# Patient Record
Sex: Female | Born: 1935 | Race: White | Hispanic: No | Smoking: Former smoker
Health system: Southern US, Community
[De-identification: ages and names within clinical notes are randomized; demographics above are authoritative.]

## PROBLEM LIST (undated history)

## (undated) DIAGNOSIS — I10 Essential (primary) hypertension: Secondary | ICD-10-CM

## (undated) DIAGNOSIS — R569 Unspecified convulsions: Secondary | ICD-10-CM

## (undated) DIAGNOSIS — B259 Cytomegaloviral disease, unspecified: Secondary | ICD-10-CM

## (undated) DIAGNOSIS — F32A Depression, unspecified: Secondary | ICD-10-CM

## (undated) DIAGNOSIS — A0839 Other viral enteritis: Secondary | ICD-10-CM

## (undated) DIAGNOSIS — L409 Psoriasis, unspecified: Secondary | ICD-10-CM

## (undated) DIAGNOSIS — M1990 Unspecified osteoarthritis, unspecified site: Secondary | ICD-10-CM

## (undated) DIAGNOSIS — Z9289 Personal history of other medical treatment: Secondary | ICD-10-CM

## (undated) DIAGNOSIS — R1310 Dysphagia, unspecified: Secondary | ICD-10-CM

## (undated) DIAGNOSIS — R933 Abnormal findings on diagnostic imaging of other parts of digestive tract: Secondary | ICD-10-CM

## (undated) DIAGNOSIS — L52 Erythema nodosum: Secondary | ICD-10-CM

## (undated) DIAGNOSIS — F329 Major depressive disorder, single episode, unspecified: Secondary | ICD-10-CM

## (undated) DIAGNOSIS — L719 Rosacea, unspecified: Secondary | ICD-10-CM

## (undated) DIAGNOSIS — G5600 Carpal tunnel syndrome, unspecified upper limb: Secondary | ICD-10-CM

## (undated) DIAGNOSIS — J45909 Unspecified asthma, uncomplicated: Secondary | ICD-10-CM

## (undated) HISTORY — DX: Unspecified convulsions: R56.9

## (undated) HISTORY — DX: Essential (primary) hypertension: I10

## (undated) HISTORY — PX: BREAST EXCISIONAL BIOPSY: SUR124

## (undated) HISTORY — PX: TONSILLECTOMY: SUR1361

## (undated) HISTORY — DX: Dysphagia, unspecified: R13.10

## (undated) HISTORY — PX: CHOLECYSTECTOMY: SHX55

## (undated) HISTORY — PX: BILATERAL CARPAL TUNNEL RELEASE: SHX6508

## (undated) HISTORY — PX: INCISIONAL HERNIA REPAIR: SHX193

## (undated) HISTORY — DX: Unspecified asthma, uncomplicated: J45.909

## (undated) HISTORY — DX: Carpal tunnel syndrome, unspecified upper limb: G56.00

## (undated) HISTORY — DX: Abnormal findings on diagnostic imaging of other parts of digestive tract: R93.3

## (undated) HISTORY — DX: Personal history of other medical treatment: Z92.89

## (undated) HISTORY — DX: Erythema nodosum: L52

## (undated) HISTORY — DX: Rosacea, unspecified: L71.9

## (undated) HISTORY — DX: Psoriasis, unspecified: L40.9

---

## 1978-08-17 DIAGNOSIS — R569 Unspecified convulsions: Secondary | ICD-10-CM

## 1978-08-17 HISTORY — DX: Unspecified convulsions: R56.9

## 1997-12-26 ENCOUNTER — Ambulatory Visit (HOSPITAL_COMMUNITY): Admission: RE | Admit: 1997-12-26 | Discharge: 1997-12-26 | Payer: Self-pay | Admitting: *Deleted

## 1998-04-15 ENCOUNTER — Other Ambulatory Visit: Admission: RE | Admit: 1998-04-15 | Discharge: 1998-04-15 | Payer: Self-pay | Admitting: Obstetrics and Gynecology

## 1998-05-29 ENCOUNTER — Other Ambulatory Visit: Admission: RE | Admit: 1998-05-29 | Discharge: 1998-05-29 | Payer: Self-pay | Admitting: Obstetrics and Gynecology

## 1999-06-09 ENCOUNTER — Other Ambulatory Visit: Admission: RE | Admit: 1999-06-09 | Discharge: 1999-06-09 | Payer: Self-pay | Admitting: Obstetrics and Gynecology

## 1999-09-18 DIAGNOSIS — R933 Abnormal findings on diagnostic imaging of other parts of digestive tract: Secondary | ICD-10-CM

## 1999-09-18 HISTORY — DX: Abnormal findings on diagnostic imaging of other parts of digestive tract: R93.3

## 1999-10-09 ENCOUNTER — Encounter (INDEPENDENT_AMBULATORY_CARE_PROVIDER_SITE_OTHER): Payer: Self-pay | Admitting: Specialist

## 1999-10-09 ENCOUNTER — Ambulatory Visit (HOSPITAL_COMMUNITY): Admission: RE | Admit: 1999-10-09 | Discharge: 1999-10-09 | Payer: Self-pay | Admitting: Gastroenterology

## 1999-10-17 ENCOUNTER — Encounter: Admission: RE | Admit: 1999-10-17 | Discharge: 1999-10-17 | Payer: Self-pay | Admitting: *Deleted

## 2000-07-14 ENCOUNTER — Other Ambulatory Visit: Admission: RE | Admit: 2000-07-14 | Discharge: 2000-07-14 | Payer: Self-pay | Admitting: Obstetrics and Gynecology

## 2000-10-19 ENCOUNTER — Encounter: Admission: RE | Admit: 2000-10-19 | Discharge: 2000-10-19 | Payer: Self-pay | Admitting: Family Medicine

## 2001-10-21 ENCOUNTER — Encounter: Admission: RE | Admit: 2001-10-21 | Discharge: 2001-10-21 | Payer: Self-pay | Admitting: Obstetrics and Gynecology

## 2001-10-21 ENCOUNTER — Encounter: Payer: Self-pay | Admitting: Obstetrics and Gynecology

## 2001-11-01 ENCOUNTER — Other Ambulatory Visit: Admission: RE | Admit: 2001-11-01 | Discharge: 2001-11-01 | Payer: Self-pay | Admitting: Obstetrics and Gynecology

## 2002-08-17 HISTORY — PX: COLONOSCOPY: SHX174

## 2002-10-23 ENCOUNTER — Encounter: Payer: Self-pay | Admitting: Obstetrics and Gynecology

## 2002-10-23 ENCOUNTER — Encounter: Admission: RE | Admit: 2002-10-23 | Discharge: 2002-10-23 | Payer: Self-pay | Admitting: Obstetrics and Gynecology

## 2003-03-16 ENCOUNTER — Ambulatory Visit (HOSPITAL_COMMUNITY): Admission: RE | Admit: 2003-03-16 | Discharge: 2003-03-16 | Payer: Self-pay | Admitting: Gastroenterology

## 2003-10-24 ENCOUNTER — Ambulatory Visit (HOSPITAL_COMMUNITY): Admission: RE | Admit: 2003-10-24 | Discharge: 2003-10-24 | Payer: Self-pay | Admitting: Obstetrics and Gynecology

## 2003-11-12 ENCOUNTER — Other Ambulatory Visit: Admission: RE | Admit: 2003-11-12 | Discharge: 2003-11-12 | Payer: Self-pay | Admitting: Obstetrics and Gynecology

## 2004-01-17 ENCOUNTER — Encounter: Admission: RE | Admit: 2004-01-17 | Discharge: 2004-01-17 | Payer: Self-pay | Admitting: Obstetrics and Gynecology

## 2004-04-04 ENCOUNTER — Ambulatory Visit (HOSPITAL_COMMUNITY): Admission: RE | Admit: 2004-04-04 | Discharge: 2004-04-04 | Payer: Self-pay | Admitting: Geriatric Medicine

## 2004-11-04 ENCOUNTER — Encounter: Payer: Self-pay | Admitting: Nurse Practitioner

## 2004-11-25 ENCOUNTER — Other Ambulatory Visit: Admission: RE | Admit: 2004-11-25 | Discharge: 2004-11-25 | Payer: Self-pay | Admitting: Obstetrics and Gynecology

## 2005-01-29 ENCOUNTER — Ambulatory Visit (HOSPITAL_COMMUNITY): Admission: RE | Admit: 2005-01-29 | Discharge: 2005-01-29 | Payer: Self-pay | Admitting: Obstetrics and Gynecology

## 2006-02-09 ENCOUNTER — Ambulatory Visit (HOSPITAL_COMMUNITY): Admission: RE | Admit: 2006-02-09 | Discharge: 2006-02-09 | Payer: Self-pay | Admitting: Obstetrics and Gynecology

## 2006-08-12 ENCOUNTER — Emergency Department (HOSPITAL_COMMUNITY): Admission: EM | Admit: 2006-08-12 | Discharge: 2006-08-12 | Payer: Self-pay | Admitting: Emergency Medicine

## 2006-12-02 ENCOUNTER — Other Ambulatory Visit: Admission: RE | Admit: 2006-12-02 | Discharge: 2006-12-02 | Payer: Self-pay | Admitting: Obstetrics and Gynecology

## 2007-02-21 ENCOUNTER — Ambulatory Visit (HOSPITAL_COMMUNITY): Admission: RE | Admit: 2007-02-21 | Discharge: 2007-02-21 | Payer: Self-pay | Admitting: Obstetrics and Gynecology

## 2008-02-29 ENCOUNTER — Ambulatory Visit (HOSPITAL_COMMUNITY): Admission: RE | Admit: 2008-02-29 | Discharge: 2008-02-29 | Payer: Self-pay | Admitting: Obstetrics and Gynecology

## 2008-12-24 ENCOUNTER — Other Ambulatory Visit: Admission: RE | Admit: 2008-12-24 | Discharge: 2008-12-24 | Payer: Self-pay | Admitting: Obstetrics and Gynecology

## 2009-01-23 ENCOUNTER — Encounter: Admission: RE | Admit: 2009-01-23 | Discharge: 2009-01-23 | Payer: Self-pay | Admitting: Obstetrics and Gynecology

## 2010-01-27 ENCOUNTER — Ambulatory Visit (HOSPITAL_COMMUNITY): Admission: RE | Admit: 2010-01-27 | Discharge: 2010-01-27 | Payer: Self-pay | Admitting: Obstetrics and Gynecology

## 2010-12-29 ENCOUNTER — Other Ambulatory Visit (HOSPITAL_COMMUNITY)
Admission: RE | Admit: 2010-12-29 | Discharge: 2010-12-29 | Disposition: A | Discharge status: Home / Self Care | Payer: Medicare Other | Source: Ambulatory Visit | Attending: Obstetrics and Gynecology | Admitting: Obstetrics and Gynecology

## 2010-12-29 ENCOUNTER — Other Ambulatory Visit: Payer: Self-pay | Admitting: Obstetrics and Gynecology

## 2010-12-29 DIAGNOSIS — Z124 Encounter for screening for malignant neoplasm of cervix: Secondary | ICD-10-CM | POA: Insufficient documentation

## 2011-01-02 NOTE — Op Note (Signed)
   NAME:  Sharon Gilmore, Sharon Gilmore                      ACCOUNT NO.:  [id number]   MEDICAL RECORD NO.:  [id number]                   PATIENT TYPE:  AMB   LOCATION:  ENDO                                 FACILITY:  [facility]   PHYSICIAN:  Bernette Redbird, M.D.                DATE OF BIRTH:  [birth year]   DATE OF PROCEDURE:  03/16/2003  DATE OF DISCHARGE:                                 OPERATIVE REPORT   PROCEDURE:  Colonoscopy.   INDICATIONS:  Screening for colon cancer in a patient at standard risk  without any worrisome symptoms.   FINDINGS:  Normal exam to the terminal ileum except for minimal sigmoid  diverticulosis.   DESCRIPTION OF PROCEDURE:  The nature, purpose, and risks of the procedure  had been discussed with the patient, who provided written consent.  Sedation  was fentanyl 70 mcg and Versed 7 mg IV without arrhythmias or desaturation.  The Olympus adult video colonoscope was advanced to the terminal ileum with  minimal difficulty, applying some external abdominal compression to  facilitate advancement.  Pullback was then performed.  The quality of the  prep was excellent, and it is felt that all areas were well-seen.   This was a normal examination except for minimal sigmoid diverticulosis.  No  polyps, cancer, colitis, vascular malformations, or extensive diverticulosis  were noted.  Retroflexion in the rectum and reinspection of the rectosigmoid  disclosed no additional findings.  No biopsies were obtained.  The patient  did have mild internal hemorrhoids observed on retroflex viewing.  No  biopsies were obtained, and there were no apparent complications.   IMPRESSION:  Essentially normal screening colonoscopy.   PLAN:  Flexible sigmoidoscopy in five years for continued screening.                                               Bernette Redbird, M.D.    RB/MEDQ  D:  03/16/2003  T:  03/16/2003  Job:  [id number]   cc:   Artist Pais, M.D.  [address], [address]   [place]  [place] [address]  Fax: [phone]   Olivia Canter, M.D., [place], [place]

## 2011-02-20 ENCOUNTER — Other Ambulatory Visit (HOSPITAL_COMMUNITY): Payer: Self-pay | Admitting: Obstetrics and Gynecology

## 2011-02-20 DIAGNOSIS — Z1231 Encounter for screening mammogram for malignant neoplasm of breast: Secondary | ICD-10-CM

## 2011-03-10 ENCOUNTER — Ambulatory Visit (HOSPITAL_COMMUNITY)
Admission: RE | Admit: 2011-03-10 | Discharge: 2011-03-10 | Disposition: A | Discharge status: Home / Self Care | Payer: Medicare Other | Source: Ambulatory Visit | Attending: Obstetrics and Gynecology | Admitting: Obstetrics and Gynecology

## 2011-03-10 DIAGNOSIS — Z1231 Encounter for screening mammogram for malignant neoplasm of breast: Secondary | ICD-10-CM | POA: Insufficient documentation

## 2012-02-02 ENCOUNTER — Other Ambulatory Visit (HOSPITAL_COMMUNITY): Payer: Self-pay | Admitting: Obstetrics and Gynecology

## 2012-02-02 DIAGNOSIS — Z1231 Encounter for screening mammogram for malignant neoplasm of breast: Secondary | ICD-10-CM

## 2012-03-28 ENCOUNTER — Ambulatory Visit (HOSPITAL_COMMUNITY)
Admission: RE | Admit: 2012-03-28 | Discharge: 2012-03-28 | Disposition: A | Discharge status: Home / Self Care | Payer: Medicare Other | Source: Ambulatory Visit | Attending: Obstetrics and Gynecology | Admitting: Obstetrics and Gynecology

## 2012-03-28 DIAGNOSIS — Z1231 Encounter for screening mammogram for malignant neoplasm of breast: Secondary | ICD-10-CM | POA: Insufficient documentation

## 2012-12-13 ENCOUNTER — Other Ambulatory Visit: Payer: Self-pay

## 2013-02-28 ENCOUNTER — Other Ambulatory Visit (HOSPITAL_COMMUNITY): Payer: Self-pay | Admitting: Obstetrics and Gynecology

## 2013-02-28 DIAGNOSIS — Z1231 Encounter for screening mammogram for malignant neoplasm of breast: Secondary | ICD-10-CM

## 2013-03-29 ENCOUNTER — Ambulatory Visit (HOSPITAL_COMMUNITY)
Admission: RE | Admit: 2013-03-29 | Discharge: 2013-03-29 | Disposition: A | Discharge status: Home / Self Care | Payer: Medicare Other | Source: Ambulatory Visit | Attending: Obstetrics and Gynecology | Admitting: Obstetrics and Gynecology

## 2013-03-29 DIAGNOSIS — Z1231 Encounter for screening mammogram for malignant neoplasm of breast: Secondary | ICD-10-CM | POA: Insufficient documentation

## 2013-09-19 ENCOUNTER — Other Ambulatory Visit: Payer: Self-pay | Admitting: Gastroenterology

## 2014-02-26 ENCOUNTER — Other Ambulatory Visit (HOSPITAL_COMMUNITY): Payer: Self-pay | Admitting: Obstetrics and Gynecology

## 2014-02-26 DIAGNOSIS — Z1231 Encounter for screening mammogram for malignant neoplasm of breast: Secondary | ICD-10-CM

## 2014-04-05 ENCOUNTER — Ambulatory Visit (HOSPITAL_COMMUNITY)
Admission: RE | Admit: 2014-04-05 | Discharge: 2014-04-05 | Disposition: A | Discharge status: Home / Self Care | Payer: Medicare Other | Source: Ambulatory Visit | Attending: Obstetrics and Gynecology | Admitting: Obstetrics and Gynecology

## 2014-04-05 DIAGNOSIS — Z1231 Encounter for screening mammogram for malignant neoplasm of breast: Secondary | ICD-10-CM | POA: Diagnosis not present

## 2015-03-25 ENCOUNTER — Other Ambulatory Visit: Payer: Self-pay

## 2015-03-25 DIAGNOSIS — Z1231 Encounter for screening mammogram for malignant neoplasm of breast: Secondary | ICD-10-CM

## 2015-04-29 ENCOUNTER — Ambulatory Visit
Admission: RE | Admit: 2015-04-29 | Discharge: 2015-04-29 | Disposition: A | Discharge status: Home / Self Care | Payer: Medicare Other | Source: Ambulatory Visit

## 2015-04-29 DIAGNOSIS — Z1231 Encounter for screening mammogram for malignant neoplasm of breast: Secondary | ICD-10-CM

## 2016-03-23 ENCOUNTER — Other Ambulatory Visit: Payer: Self-pay | Admitting: Obstetrics and Gynecology

## 2016-03-23 DIAGNOSIS — Z1231 Encounter for screening mammogram for malignant neoplasm of breast: Secondary | ICD-10-CM

## 2016-05-21 ENCOUNTER — Ambulatory Visit
Admission: RE | Admit: 2016-05-21 | Discharge: 2016-05-21 | Disposition: A | Discharge status: Home / Self Care | Payer: Medicare Other | Source: Ambulatory Visit | Attending: Obstetrics and Gynecology | Admitting: Obstetrics and Gynecology

## 2016-05-21 DIAGNOSIS — Z1231 Encounter for screening mammogram for malignant neoplasm of breast: Secondary | ICD-10-CM

## 2016-06-02 ENCOUNTER — Encounter: Payer: Self-pay | Admitting: Nurse Practitioner

## 2016-06-03 ENCOUNTER — Ambulatory Visit (INDEPENDENT_AMBULATORY_CARE_PROVIDER_SITE_OTHER): Payer: Medicare Other | Admitting: Nurse Practitioner

## 2016-06-03 ENCOUNTER — Encounter: Payer: Self-pay | Admitting: Nurse Practitioner

## 2016-06-03 VITALS — BP 138/72 | HR 85 | Ht 63.5 in | Wt 184.0 lb

## 2016-06-03 DIAGNOSIS — R0789 Other chest pain: Secondary | ICD-10-CM

## 2016-06-03 DIAGNOSIS — R0609 Other forms of dyspnea: Secondary | ICD-10-CM | POA: Diagnosis not present

## 2016-06-03 DIAGNOSIS — R9431 Abnormal electrocardiogram [ECG] [EKG]: Secondary | ICD-10-CM

## 2016-06-03 DIAGNOSIS — R0600 Dyspnea, unspecified: Secondary | ICD-10-CM

## 2016-06-03 DIAGNOSIS — R011 Cardiac murmur, unspecified: Secondary | ICD-10-CM | POA: Diagnosis not present

## 2016-06-03 NOTE — Patient Instructions (Addendum)
We will be checking the following labs today - NONE   Medication Instructions:    Continue with your current medicines.     Testing/Procedures To Be Arranged:  Echocardiogram   Lexiscan Myoview  Follow-Up:   We will see how your tests turn out and then decide about your follow up.     Other Special Instructions:   N/A    If you need a refill on your cardiac medications before your next appointment, please call your pharmacy.   Call the [facility] office at [phone] if you have any questions, problems or concerns.

## 2016-06-03 NOTE — Progress Notes (Signed)
CARDIOLOGY OFFICE NOTE  Date:  06/03/2016    Sharon Gilmore Date of Birth: [birth year] Medical Record #[id number]  PCP:  Olivia CanterKELLY, WILLIAM, MD  Cardiologist:  Delton SeeNelson (DOD)  Chief Complaint  Patient presents with  . Heart Murmur    New patient visit - seen for Dr. Delton SeeNelson ([facility])    History of Present Illness: Sharon Gilmore is a 82 y.o. female who presents today for a new patient visit. Seen for Dr. Delton SeeNelson (DOD).   She has no known coronary disease. Does have history of HTN, psoriasis, impaired glucose and asthma. Records indicate that she has had a pulmonary nodule that is followed by [facility].   Referred here by PCP for evaluation of a heart murmur.   Comes in today. Here alone. She notes that she has not told any of her 7 children that she is here. Friends with Charlotte CrumbPat Adelman and Herma CarsonMichelle Lenze. She notes that she has had some progressive DOE over the past 4 to 6 weeks. She went to [place] for vacation and was not "able to keep up". Had more shortness of breath with walking up inclines. She attributed this to her asthma. No actual chest pain/discomfort noted. She is not very active as a general rule - no regular exercise. Not dizzy or lightheaded. No syncope. She lives alone. Retired Engineer, civil (consulting)nurse. Only history is HTN - no prior stroke/known heart disease. She has had a [facility] nod[facility] - [facility] - but now released - turned out benign.  Past Medical History:  Diagnosis Date  . Abnormal barium swallow 09/1999  . Asthma   . Carpal tunnel syndrome   . Erythema nodosum   . H/O Doppler ultrasound    LEFT LEG 11/1997  . Hypertension   . Impaired fasting glucose   . Psoriasis   . Rosacea   . Seizures (HCC) 1980  . Swallowing disorder     Past Surgical History:  Procedure Laterality Date  . CHOLECYSTECTOMY    . COLONOSCOPY  2004  . INCISIONAL HERNIA REPAIR    . TONSILLECTOMY       Medications: Current Outpatient Prescriptions  Medication Sig Dispense Refill  . aspirin  EC 81 MG tablet Take 81 mg by mouth daily.    . Cholecalciferol (VITAMIN D3) 2000 units TABS Take 1 tablet by mouth daily.    . enalapril (VASOTEC) 20 MG tablet Take 20 mg by mouth daily.    . hydrochlorothiazide (HYDRODIURIL) 12.5 MG tablet Take 12.5 mg by mouth every other day.    . Multiple Vitamins-Minerals (PRESERVISION AREDS 2) CAPS Take 1 capsule by mouth 2 (two) times daily.     No current facility-administered medications for this visit.     Allergies: Allergies  Allergen Reactions  . Celebrex [Celecoxib]     LEG ACHING   . Codeine   . Meloxicam     TUMMY ACHE, INEFFECTIVE  . Nortriptyline   . Penicillins   . Percocet [Oxycodone-Acetaminophen]     Social History: The patient  reports that she has quit smoking. She has never used smokeless tobacco. She reports that she drinks alcohol. She reports that she does not use drugs.   Family History: The patient's family history includes Breast cancer in her paternal grandmother; CVA in her maternal grandmother, mother, and paternal grandfather; Diabetes in her paternal grandmother; Leukemia in her brother; Lung cancer in her paternal uncle; Pancreatic cancer in her brother and paternal aunt; Parkinson's disease in her father; Peripheral vascular disease  in her maternal grandfather; Stroke in her maternal grandmother, mother, paternal aunt, and paternal grandfather.   Review of Systems: Please see the history of present illness.   Otherwise, the review of systems is positive for none.   All other systems are reviewed and negative.   Physical Exam: VS:  BP 138/72   Pulse 85   Ht 5' 3.5" (1.613 m)   Wt 184 lb (83.5 kg)   BMI 32.08 kg/m  .  BMI Body mass index is 32.08 kg/m.  Wt Readings from Last 3 Encounters:  06/03/16 184 lb (83.5 kg)    General: Pleasant. She is overweight. She is alert and in no acute distress.   HEENT: Normal.  Neck: Supple, no JVD, carotid bruits, or masses noted.  Cardiac: Regular rate and rhythm.  Soft systolic murmur noted.  No edema.  Respiratory:  Lungs are clear to auscultation bilaterally with normal work of breathing.  GI: Soft and nontender.  MS: No deformity or atrophy. Gait and ROM intact.  Skin: Warm and dry. Color is normal.  Neuro:  Strength and sensation are intact and no gross focal deficits noted.  Psych: Alert, appropriate and with normal affect.   LABORATORY DATA:  EKG:  EKG is ordered today. This demonstrates NSR - she does have lateral Q waves - no prior EKG to compare with - this is suggestive of prior infarct.  No results found for: WBC, HGB, HCT, PLT, GLUCOSE, CHOL, TRIG, HDL, LDLDIRECT, LDLCALC, ALT, AST, NA, K, CL, CREATININE, BUN, CO2, TSH, PSA, INR, GLUF, HGBA1C, MICROALBUR  BNP (last 3 results) No results for input(s): BNP in the last 8760 hours.  ProBNP (last 3 results) No results for input(s): PROBNP in the last 8760 hours.   Other Studies Reviewed Today:   Assessment/Plan: 1. Murmur -  With some DOE reported - will arrange for echocardiogram.   2. HTN - BP is on controlled on her current regimen.   3. Abnormal EKG - suggestive of prior infarct - will arrange for [facility]  Current medicines are reviewed with the patient today.  The patient does not have concerns regarding medicines other than what has been noted above.  The following changes have been made:  See above.  Labs/ tests ordered today include:    Orders Placed This Encounter  Procedures  . Myocardial Perfusion Imaging  . EKG 12-Lead  . ECHOCARDIOGRAM COMPLETE     Disposition:   Further disposition pending. Will see how her studies turn out and then decide about follow up.    Patient is agreeable to this plan and will call if any problems develop in the interim.   Signed: Rosalio Macadamia, RN, ANP-C 06/03/2016 8:38 AM  [facility] [address] [place], [place]  [address] Phone: [phone] Fax: [phone]

## 2016-06-04 ENCOUNTER — Telehealth (HOSPITAL_COMMUNITY): Payer: Self-pay | Admitting: *Deleted

## 2016-06-04 NOTE — Telephone Encounter (Signed)
Patient given detailed instructions per Myocardial Perfusion Study Information Sheet for the test on 06/09/16. Patient notified to arrive 15 minutes early and that it is imperative to arrive on time for appointment to keep from having the test rescheduled.  If you need to cancel or reschedule your appointment, please call the office within 24 hours of your appointment. Failure to do so may result in a cancellation of your appointment, and a $50 no show fee. Patient verbalized understanding. Khushbu Pippen Jacqueline    

## 2016-06-09 ENCOUNTER — Ambulatory Visit (HOSPITAL_COMMUNITY): Payer: Medicare Other | Attending: Cardiology

## 2016-06-09 DIAGNOSIS — R0789 Other chest pain: Secondary | ICD-10-CM | POA: Diagnosis not present

## 2016-06-09 DIAGNOSIS — R9431 Abnormal electrocardiogram [ECG] [EKG]: Secondary | ICD-10-CM

## 2016-06-09 DIAGNOSIS — I10 Essential (primary) hypertension: Secondary | ICD-10-CM | POA: Diagnosis not present

## 2016-06-09 DIAGNOSIS — R0609 Other forms of dyspnea: Secondary | ICD-10-CM | POA: Diagnosis not present

## 2016-06-09 DIAGNOSIS — R011 Cardiac murmur, unspecified: Secondary | ICD-10-CM | POA: Diagnosis not present

## 2016-06-09 LAB — MYOCARDIAL PERFUSION IMAGING
LV dias vol: 71 mL (ref 46–106)
LV sys vol: 23 mL
Peak HR: 85 {beats}/min
RATE: 0.28
Rest HR: 62 {beats}/min
SDS: 0
SRS: 1
SSS: 1
TID: 0.9

## 2016-06-09 MED ORDER — TECHNETIUM TC 99M TETROFOSMIN IV KIT
11.0000 | PACK | Freq: Once | INTRAVENOUS | Status: AC | PRN
  Administered 2016-06-09: 11 via INTRAVENOUS
  Filled 2016-06-09: qty 11

## 2016-06-09 MED ORDER — TECHNETIUM TC 99M TETROFOSMIN IV KIT
32.4000 | PACK | Freq: Once | INTRAVENOUS | Status: AC | PRN
  Administered 2016-06-09: 32.4 via INTRAVENOUS
  Filled 2016-06-09: qty 33

## 2016-06-09 MED ORDER — REGADENOSON 0.4 MG/5ML IV SOLN
0.4000 mg | Freq: Once | INTRAVENOUS | Status: AC
  Administered 2016-06-09: 0.4 mg via INTRAVENOUS

## 2016-06-11 ENCOUNTER — Ambulatory Visit (HOSPITAL_COMMUNITY)
Admission: RE | Admit: 2016-06-11 | Discharge: 2016-06-11 | Disposition: A | Discharge status: Home / Self Care | Payer: Medicare Other | Source: Ambulatory Visit | Attending: Nurse Practitioner | Admitting: Nurse Practitioner

## 2016-06-11 DIAGNOSIS — R011 Cardiac murmur, unspecified: Secondary | ICD-10-CM | POA: Diagnosis not present

## 2016-06-11 DIAGNOSIS — I119 Hypertensive heart disease without heart failure: Secondary | ICD-10-CM | POA: Insufficient documentation

## 2016-06-11 DIAGNOSIS — R0609 Other forms of dyspnea: Secondary | ICD-10-CM | POA: Diagnosis not present

## 2016-06-11 DIAGNOSIS — R9431 Abnormal electrocardiogram [ECG] [EKG]: Secondary | ICD-10-CM | POA: Insufficient documentation

## 2016-06-11 DIAGNOSIS — R0789 Other chest pain: Secondary | ICD-10-CM | POA: Diagnosis not present

## 2016-06-11 MED ORDER — PERFLUTREN LIPID MICROSPHERE
INTRAVENOUS | Status: AC
  Filled 2016-06-11: qty 10

## 2016-06-12 MED FILL — Perflutren Lipid Microsphere IV Susp 1.1 MG/ML: INTRAVENOUS | Qty: 10 | Status: AC

## 2016-07-05 ENCOUNTER — Emergency Department (HOSPITAL_BASED_OUTPATIENT_CLINIC_OR_DEPARTMENT_OTHER)
Admission: EM | Admit: 2016-07-05 | Discharge: 2016-07-05 | Disposition: A | Discharge status: Home / Self Care | Payer: Medicare Other | Source: Home / Self Care

## 2016-07-05 ENCOUNTER — Emergency Department (HOSPITAL_COMMUNITY)
Admission: EM | Admit: 2016-07-05 | Discharge: 2016-07-05 | Disposition: A | Discharge status: Home / Self Care | Payer: Medicare Other | Attending: Emergency Medicine | Admitting: Emergency Medicine

## 2016-07-05 ENCOUNTER — Emergency Department (HOSPITAL_COMMUNITY): Payer: Medicare Other

## 2016-07-05 ENCOUNTER — Encounter (HOSPITAL_COMMUNITY): Payer: Self-pay | Admitting: Emergency Medicine

## 2016-07-05 DIAGNOSIS — I10 Essential (primary) hypertension: Secondary | ICD-10-CM | POA: Insufficient documentation

## 2016-07-05 DIAGNOSIS — M79651 Pain in right thigh: Secondary | ICD-10-CM | POA: Insufficient documentation

## 2016-07-05 DIAGNOSIS — M79604 Pain in right leg: Secondary | ICD-10-CM

## 2016-07-05 DIAGNOSIS — J45909 Unspecified asthma, uncomplicated: Secondary | ICD-10-CM | POA: Diagnosis not present

## 2016-07-05 DIAGNOSIS — Z87891 Personal history of nicotine dependence: Secondary | ICD-10-CM | POA: Insufficient documentation

## 2016-07-05 DIAGNOSIS — M79609 Pain in unspecified limb: Secondary | ICD-10-CM | POA: Diagnosis not present

## 2016-07-05 DIAGNOSIS — Z7982 Long term (current) use of aspirin: Secondary | ICD-10-CM | POA: Insufficient documentation

## 2016-07-05 NOTE — ED Triage Notes (Signed)
Patient reports she has done a lot of flying recently. Complaining of right thigh pain x16 days. Patient was seen by PCP and sent here to be tested for a blood clot.

## 2016-07-05 NOTE — Progress Notes (Signed)
*  PRELIMINARY RESULTS* Vascular Ultrasound Right lower extremity venous duplex has been completed.  Preliminary findings: No evidence of DVT or baker's cyst.  Farrel DemarkJill Eunice, RDMS, RVT  07/05/2016, 11:35 AM

## 2016-07-05 NOTE — ED Notes (Signed)
PT DISCHARGED. INSTRUCTIONS GIVEN. AAOX4. PT IN NO APPARENT DISTRESS. THE OPPORTUNITY TO ASK QUESTIONS WAS PROVIDED. 

## 2016-07-05 NOTE — Discharge Instructions (Signed)
Your ultrasound and your imaging were negative for abnormality . Please follow up with your primary care doctor tomorrow. Return to the ED if you develop uncontrolled pain. Fever, leg discoloration, chest pain, shortness of breath, leg weakness.

## 2016-07-05 NOTE — ED Provider Notes (Signed)
[facility]-EMERGENCY DEPT Provider Note   CSN: [id number] Arrival date & time: 07/05/16  [id number]     History   Chief Complaint Chief Complaint  Patient presents with  . Leg Pain    HPI Sharon Gilmore is a 82 y.o. female. Who presents the the ED for R/O dvt R leg. She was sent by her pcp. Patient has a had 16 days of pain in the R  Ant thigh. She states that it is constantly achy and sometimes burning. Worse with standing and ambulation. 2 days ago. She had pitting edema in the right ankle. Patient states that in September. She flew several times to [place]. Her doctor sent her here for DVT rule out. She has no swelling in the right leg today. She denies history of hip or low back problems. 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 This is resolved. She denies any weakness, saddle anesthesia. She has been taking Aleve twice daily with moderate relief of her symptoms HPI  Past Medical History:  Diagnosis Date  . Abnormal barium swallow 09/1999  . Asthma   . Carpal tunnel syndrome   . Erythema nodosum   . H/O Doppler ultrasound    LEFT LEG 11/1997  . Hypertension   . Impaired fasting glucose   . Psoriasis   . Rosacea   . Seizures (HCC) 1980  . Swallowing disorder   . Systolic murmur     There are no active problems to display for this patient.   Past Surgical History:  Procedure Laterality Date  . CHOLECYSTECTOMY    . COLONOSCOPY  2004  . INCISIONAL HERNIA REPAIR    . TONSILLECTOMY      OB History    No data available       Home Medications    Prior to Admission medications   Medication Sig Start Date End Date Taking? Authorizing Provider  aspirin EC 81 MG tablet Take 81 mg by mouth daily.    Historical Provider, MD  Cholecalciferol (VITAMIN D3) 2000 units TABS Take 1 tablet by mouth daily.    Historical Provider, MD  enalapril (VASOTEC) 20 MG tablet Take 20 mg  by mouth daily.    Historical Provider, MD  hydrochlorothiazide (HYDRODIURIL) 12.5 MG tablet Take 12.5 mg by mouth every other day.    Historical Provider, MD  Multiple Vitamins-Minerals (PRESERVISION AREDS 2) CAPS Take 1 capsule by mouth 2 (two) times daily.    Historical Provider, MD    Family History Family History  Problem Relation Age of Onset  . CVA Mother   . Stroke Mother   . Parkinson's disease Father   . Leukemia Brother   . CVA Maternal Grandmother   . Stroke Maternal Grandmother   . Peripheral vascular disease Maternal Grandfather   . Breast cancer Paternal Grandmother   . Diabetes Paternal Grandmother   . CVA Paternal Grandfather   . Stroke Paternal Grandfather   . Pancreatic cancer Paternal Aunt   . Stroke Paternal Aunt   . Lung cancer Paternal Uncle   . Pancreatic cancer Brother     Social History Social History  Substance Use Topics  . Smoking status: Former Games developermoker  . Smokeless tobacco: Never Used  . Alcohol use Yes     Allergies   Celebrex [celecoxib]; Codeine; Meloxicam; Nortriptyline; Penicillins; and Percocet [oxycodone-acetaminophen]   Review of Systems Review of Systems  Ten systems reviewed and  are negative for acute change, except as noted in the HPI.   Physical Exam Updated Vital Signs BP 144/89 (BP Location: Right Arm)   Pulse 90   Temp 98.4 F (36.9 C) (Oral)   Resp 18   Ht 5\' 3"  (1.6 m)   Wt 83 kg   SpO2 94%   BMI 32.42 kg/m   Physical Exam  Constitutional: She is oriented to person, place, and time. She appears well-developed and well-nourished. No distress.  HENT:  Head: Normocephalic and atraumatic.  Eyes: Conjunctivae are normal. No scleral icterus.  Neck: Normal range of motion.  Cardiovascular: Normal rate, regular rhythm, normal heart sounds and intact distal pulses.  Exam reveals no gallop and no friction rub.   No murmur heard. No peripheral edema  Pulmonary/Chest: Effort normal and breath sounds normal. No  respiratory distress.  Abdominal: Soft. Bowel sounds are normal. She exhibits no distension and no mass. There is no tenderness. There is no guarding.  Neurological: She is alert and oriented to person, place, and time.  Skin: Skin is warm and dry. She is not diaphoretic.  Nursing note and vitals reviewed.    ED Treatments / Results  Labs (all labs ordered are listed, but only abnormal results are displayed) Labs Reviewed - No data to display  EKG  EKG Interpretation None       Radiology No results found.  Procedures Procedures (including critical care time)  Medications Ordered in ED Medications - No data to display   Initial Impression / Assessment and Plan / ED Course  I have reviewed the triage vital signs and the nursing notes.  Pertinent labs & imaging results that were available during my care of the patient were reviewed by me and considered in my medical decision making (see chart for details).  Clinical Course     Patient with negative plain film and negative ultrasound. Patient has no signs of infection. I suspect  Radiculopathy.  Patient appears safe for discharge. She is ambulatory. Final Clinical Impressions(s) / ED Diagnoses   Final diagnoses:  Right thigh pain    New Prescriptions New Prescriptions   No medications on file     Arthor Captainbigail Aaronmichael Brumbaugh, PA-C 07/05/16 1634    Cathren LaineKevin Steinl, MD 07/12/16 [phone]

## 2017-01-04 ENCOUNTER — Encounter (INDEPENDENT_AMBULATORY_CARE_PROVIDER_SITE_OTHER): Payer: Self-pay

## 2017-01-04 ENCOUNTER — Ambulatory Visit (INDEPENDENT_AMBULATORY_CARE_PROVIDER_SITE_OTHER): Payer: Medicare Other | Admitting: Neurology

## 2017-01-04 ENCOUNTER — Encounter: Payer: Self-pay | Admitting: Neurology

## 2017-01-04 DIAGNOSIS — M4800 Spinal stenosis, site unspecified: Secondary | ICD-10-CM | POA: Insufficient documentation

## 2017-01-04 DIAGNOSIS — R109 Unspecified abdominal pain: Secondary | ICD-10-CM | POA: Insufficient documentation

## 2017-01-04 DIAGNOSIS — M5442 Lumbago with sciatica, left side: Secondary | ICD-10-CM | POA: Diagnosis not present

## 2017-01-04 DIAGNOSIS — R1012 Left upper quadrant pain: Secondary | ICD-10-CM

## 2017-01-04 MED ORDER — GABAPENTIN 100 MG PO CAPS: 300.0000 mg | ORAL_CAPSULE | Freq: Every day | ORAL | 6 refills | Status: DC

## 2017-01-04 NOTE — Progress Notes (Signed)
PATIENT: Sharon Gilmore DOB: 01-05-1934  Chief Complaint  Patient presents with  . Pain    Reports pain over left ribcage and left leg from buttock to ankle. Pain prevents her from getting a good night's sleep and limits her walking ability.  Says the pain and sleep deprivation is causing her depression.  Marland Kitchen PCP    Olin Hauser, MD     HISTORICAL  Sharon Gilmore is 83 years old right-handed female, seen in refer by her primary care physician Dr. Claiborne Billings, Gwyndolyn Saxon for evaluation of left-sided pain, initial evaluation was on Jan 04 2017.  I reviewed and summarized the referring note, she had past medical history of hypertension, seizure, last recurrence was in 1980s, psoriasis, asthma, carpal tunnel syndromes, vitamin D deficiency,  She was previously highly functional, in March 2018, she woke up one night from sleep, noticed left upper quadrant deep achy pain, also burning sensation, which has been persistent since then, she also reported radiating pain to her left shoulder, she has difficulty sleeping, has to sit up in her chair for hours to tire herself out with computer games before she can go to sleep,  Over the past 2 months, her pain has slight improvement, but in May 2018, she also developed left lower back pain, radiating pain to left hip, along left lateral thigh, to left leg, to left ankle, she has mild gait abnormality due to pain, urinary urgency,  I am was able to review x-ray of lumbar or November 06 2016 from the wound, extensive multilevel discogenic degenerative changes most severe at L3-4, L4-5, L5-S1, severe multilevel lower lumbar facet arthrosis,  X-ray of thoracic spine, multilevel mild to moderate disc degenerative changes throughout the mid and the lower thoracic spine,  Chest x-ray showed no acute findings  I reviewed the laboratory evaluation in 2018, negative troponin, normal CBC with hemoglobin of 12 point 9, ESR of 17, C-reactive protein of 10, normal  CMP, with creatinine of 0.57, glucose was 96  REVIEW OF SYSTEMS: Full 14 system review of systems performed and notable only for changing appetite, decreased energy, not enough sleep, decreased energy, depression anxiety, sleepiness, fatigue  ALLERGIES: Allergies  Allergen Reactions  . Celebrex [Celecoxib]     LEG ACHING   . Codeine Nausea And Vomiting  . Meloxicam     TUMMY ACHE, INEFFECTIVE  . Nortriptyline Other (See Comments)    unknown  . Percocet [Oxycodone-Acetaminophen]     unknown  . Penicillins Rash    Has patient had a PCN reaction causing immediate rash, facial/tongue/throat swelling, SOB or lightheadedness with hypotension: No Has patient had a PCN reaction causing severe rash involving mucus membranes or skin necrosis: No Has patient had a PCN reaction that required hospitalization No Has patient had a PCN reaction occurring within the last 10 years: No If all of the above answers are "NO", then may proceed with Cephalosporin use.    HOME MEDICATIONS: Current Outpatient Prescriptions  Medication Sig Dispense Refill  . aspirin EC 81 MG tablet Take 81 mg by mouth daily.    . Cholecalciferol (VITAMIN D3) 2000 units TABS Take 1 tablet by mouth daily.    . enalapril (VASOTEC) 20 MG tablet Take 20 mg by mouth daily.    . hydrochlorothiazide (HYDRODIURIL) 12.5 MG tablet Take 12.5 mg by mouth every other day.    . Multiple Vitamins-Minerals (PRESERVISION AREDS 2) CAPS Take 1 capsule by mouth 2 (two) times daily.     No current  facility-administered medications for this visit.     PAST MEDICAL HISTORY: Past Medical History:  Diagnosis Date  . Abnormal barium swallow 09/1999  . Asthma   . Carpal tunnel syndrome   . Erythema nodosum   . H/O Doppler ultrasound    LEFT LEG 11/1997  . Hypertension   . Impaired fasting glucose   . Psoriasis   . Rosacea   . Seizures ([place]) 1980  . Swallowing disorder   . Systolic murmur     PAST SURGICAL HISTORY: Past Surgical  History:  Procedure Laterality Date  . CHOLECYSTECTOMY    . COLONOSCOPY  2004  . INCISIONAL HERNIA REPAIR    . TONSILLECTOMY      FAMILY HISTORY: Family History  Problem Relation Age of Onset  . CVA Mother   . Stroke Mother   . Parkinson's disease Father   . Leukemia Brother   . CVA Maternal Grandmother   . Stroke Maternal Grandmother   . Peripheral vascular disease Maternal Grandfather   . Breast cancer Paternal Grandmother   . Diabetes Paternal Grandmother   . CVA Paternal Grandfather   . Stroke Paternal Grandfather   . Pancreatic cancer Paternal Aunt   . Stroke Paternal Aunt   . Lung cancer Paternal Uncle   . Pancreatic cancer Brother     SOCIAL HISTORY:  Social History   Social History  . Marital status: Widowed    Spouse name: N/A  . Number of children: 7  . Years of education: College   Occupational History  . RETIRED RN    Social History Main Topics  . Smoking status: Former Smoker    Quit date: 1982  . Smokeless tobacco: Never Used  . Alcohol use Yes     Comment: 1-2 times per week  . Drug use: No  . Sexual activity: Not on file   Other Topics Concern  . Not on file   Social History Narrative   Lives at home alone.   Right-handed.   Two cups caffeine per day.     PHYSICAL EXAM   Vitals:   01/04/17 1355  BP: (!) 149/80  Pulse: 86  Weight: 182 lb (82.6 kg)  Height: 5' 3"  (1.6 m)    Not recorded      Body mass index is 32.24 kg/m.  PHYSICAL EXAMNIATION:  Gen: NAD, conversant, well nourised, obese, well groomed                     Cardiovascular: Regular rate rhythm, no peripheral edema, warm, nontender. Eyes: Conjunctivae clear without exudates or hemorrhage Neck: Supple, no carotid bruits. Pulmonary: Clear to auscultation bilaterally  Abdomen: Tenderness of left upper quadrant and mid abdomen upon deep palpitation, no skin rash, no rebound  NEUROLOGICAL EXAM:  MENTAL STATUS: Speech:    Speech is normal; fluent and  spontaneous with normal comprehension.  Cognition:     Orientation to time, place and person     Normal recent and remote memory     Normal Attention span and concentration     Normal Language, naming, repeating,spontaneous speech     Fund of knowledge   CRANIAL NERVES: CN II: Visual fields are full to confrontation. Fundoscopic exam is normal with sharp discs and no vascular changes. Pupils are round equal and briskly reactive to light. CN III, IV, VI: extraocular movement are normal. No ptosis. CN V: Facial sensation is intact to pinprick in all 3 divisions bilaterally. Corneal responses are intact.  CN  VII: Face is symmetric with normal eye closure and smile. CN VIII: Hearing is normal to rubbing fingers CN IX, X: Palate elevates symmetrically. Phonation is normal. CN XI: Head turning and shoulder shrug are intact CN XII: Tongue is midline with normal movements and no atrophy.  MOTOR: There is no pronator drift of out-stretched arms. Muscle bulk and tone are normal. Muscle strength is normal.  REFLEXES: Reflexes are 2+ and symmetric at the biceps, triceps, knees, and absent at ankles. Plantar responses are flexor.  SENSORY: Intact to light touch, pinprick, positional sensation and vibratory sensation are intact in fingers and toes.  COORDINATION: Rapid alternating movements and fine finger movements are intact. There is no dysmetria on finger-to-nose and heel-knee-shin.    GAIT/STANCE: She needs push up to get up from seated position, cautious, unsteady   DIAGNOSTIC DATA (LABS, IMAGING, TESTING) - I reviewed patient records, labs, notes, testing and imaging myself where available.   ASSESSMENT AND PLAN  Sharon Gilmore is a 83 y.o. female   Left upper quadrant abdominal pain, tenderness upon deep palpitation  Need to rule out abdomen space occupying lesion  Proceed with CT abdomen and pelvis with and without contrast New-onset left lumbar radiating pain  Multilevel  lumbar degenerative changes, MRI of the lumbar  Gabapentin 100 mg 3 tablets every night for sleep  Marcial Pacas, M.D. Ph.D.  [facility] [address], [place], [place] [address] Ph: [phone] Fax: [phone]  CC: Olin Hauser, MD

## 2017-01-12 ENCOUNTER — Telehealth: Payer: Self-pay | Admitting: Neurology

## 2017-01-12 DIAGNOSIS — R1012 Left upper quadrant pain: Secondary | ICD-10-CM

## 2017-01-12 NOTE — Telephone Encounter (Signed)
I spoke to NeboJennifer at [facility]. She stated that Dr. Terrace ArabiaYan will need to cosign the order and she will flip the order for 01/14/17.

## 2017-01-12 NOTE — Telephone Encounter (Addendum)
Please noticed [facility] have changed the order to CT abdomen and pelvic with contrast

## 2017-01-12 NOTE — Addendum Note (Signed)
Addended by: Levert FeinsteinYAN, Daila Elbert on: 01/12/2017 09:41 AM   Modules accepted: Orders

## 2017-01-12 NOTE — Telephone Encounter (Signed)
Noted, thank you

## 2017-01-12 NOTE — Telephone Encounter (Signed)
Sharon Gilmore with GI called to know if they are able to change CT abdomen/pelvis with and w/out contrast.  Sharon Gilmore states CT abdomen/pelvis with contrast only will show them everything they will need.  Please call

## 2017-01-12 NOTE — Telephone Encounter (Signed)
Co-signed order

## 2017-01-12 NOTE — Telephone Encounter (Signed)
Sharon Gilmore is aware of the change.

## 2017-01-14 ENCOUNTER — Ambulatory Visit
Admission: RE | Admit: 2017-01-14 | Discharge: 2017-01-14 | Disposition: A | Discharge status: Home / Self Care | Payer: Medicare Other | Source: Ambulatory Visit | Attending: Neurology | Admitting: Neurology

## 2017-01-14 DIAGNOSIS — R1012 Left upper quadrant pain: Secondary | ICD-10-CM

## 2017-01-14 DIAGNOSIS — M5442 Lumbago with sciatica, left side: Secondary | ICD-10-CM

## 2017-01-14 MED ORDER — IOPAMIDOL (ISOVUE-300) INJECTION 61%
100.0000 mL | Freq: Once | INTRAVENOUS | Status: AC | PRN
  Administered 2017-01-14: 100 mL via INTRAVENOUS

## 2017-01-15 ENCOUNTER — Telehealth: Payer: Self-pay | Admitting: Neurology

## 2017-01-15 NOTE — Telephone Encounter (Signed)
Please check on patient about her pain,  MRI of the lumbar spine showed severe multilevel degenerative changes, with evidence of severe spinal stenosis at L2-3, L3-4,  If she still has significant pain, any increase her gabapentin to much higher dose, or evaluate her at the earlier appointment  IMPRESSION:  This MRI of the lumbar spine shows severe multilevel degenerative changes as detailed above.  The most significant findings are: 1.   At L2-L3 there is severe spinal stenosis associated with severe left lateral recess stenosis that could lead to left L3 nerve root compression. 2.   At L3-L4, there is severe spinal stenosis though there is no definite nerve root compression. 3.   At L4-L5, there is mild to moderate spinal stenosis and moderately severe left foraminal narrowing that could lead to left L5 nerve root compression. 4.   Degenerative changes at other levels are less likely to lead to nerve root compression.

## 2017-01-18 ENCOUNTER — Telehealth: Payer: Self-pay | Admitting: Neurology

## 2017-01-18 NOTE — Telephone Encounter (Signed)
Patient aware of results.  She was unable to tolerate gabapentin at any higher dose over 300mg  at bedtime.  She would like to further discuss treatment options at her appt on 01/26/17.

## 2017-01-18 NOTE — Telephone Encounter (Signed)
Patient aware of results.  She was unable to tolerate gabapentin at any higher dose over 300mg at bedtime.  She would like to further discuss treatment options at her appt on 01/26/17. 

## 2017-01-18 NOTE — Telephone Encounter (Signed)
Pt calling to get MRI and CT results. She states pain has increased.

## 2017-01-18 NOTE — Telephone Encounter (Signed)
Patient aware of results and will keep her pending appt to further discuss.

## 2017-01-20 ENCOUNTER — Encounter: Payer: Self-pay | Admitting: Neurology

## 2017-01-20 ENCOUNTER — Ambulatory Visit (INDEPENDENT_AMBULATORY_CARE_PROVIDER_SITE_OTHER): Payer: Medicare Other | Admitting: Neurology

## 2017-01-20 VITALS — BP 152/85 | HR 97 | Ht 63.0 in | Wt 181.2 lb

## 2017-01-20 DIAGNOSIS — M5442 Lumbago with sciatica, left side: Secondary | ICD-10-CM

## 2017-01-20 DIAGNOSIS — R1012 Left upper quadrant pain: Secondary | ICD-10-CM | POA: Diagnosis not present

## 2017-01-20 MED ORDER — OXCARBAZEPINE 150 MG PO TABS: 150.0000 mg | ORAL_TABLET | Freq: Two times a day (BID) | ORAL | 6 refills | Status: DC

## 2017-01-20 MED ORDER — LIDOCAINE 4 % EX CREA: 1.0000 "application " | TOPICAL_CREAM | CUTANEOUS | 6 refills | Status: DC | PRN

## 2017-01-20 NOTE — Progress Notes (Signed)
PATIENT: Sharon Gilmore DOB: 1934/02/23  Chief Complaint  Patient presents with  . Left Upper Quadrant Pain    She would like to discuss her CT results.  . Back Pain    She would like to discuss her MRI results.  She is still taking gabapentin 349m, qhs.  She was unable to tolerate 4076m qhs due to excessive drowsiness the following day.     HISTORICAL  Sharon PFANNENSTIELs 83 years old right-handed female, seen in refer by her primary care physician Dr. KeClaiborne BillingsWiGwyndolyn Saxonor evaluation of left-sided pain, initial evaluation was on Jan 04 2017.  I reviewed and summarized the referring note, she had past medical history of hypertension, seizure, last recurrence was in 1980s, psoriasis, asthma, carpal tunnel syndromes, vitamin D deficiency,  She was previously highly functional, in March 2018, she woke up one night from sleep, noticed left upper quadrant deep achy pain, also burning sensation, which has been persistent since then, she also reported radiating pain to her left shoulder, she has difficulty sleeping, has to sit up in her chair for hours to tire herself out with computer games before she can go to sleep,  Over the past 2 months, her pain has slight improvement, but in May 2018, she also developed left lower back pain, radiating pain to left hip, along left lateral thigh, to left leg, to left ankle, she has mild gait abnormality due to pain, urinary urgency,  I am was able to review x-ray of lumbar or November 06 2016 from the wound, extensive multilevel discogenic degenerative changes most severe at L3-4, L4-5, L5-S1, severe multilevel lower lumbar facet arthrosis,  X-ray of thoracic spine, multilevel mild to moderate disc degenerative changes throughout the mid and the lower thoracic spine,  Chest x-ray showed no acute findings  I reviewed the laboratory evaluation in 2018, negative troponin, normal CBC with hemoglobin of 12 point 9, ESR of 17, C-reactive protein of 10,  normal CMP, with creatinine of 0.57, glucose was 96  UPDATE January 20 2017: She continue complains of significant left upper abdominal area radiating pain, radiating pain to left lower extremity, gabapentin 300 mg has been helpful, but higher dose caused drowsiness.  We have personally reviewed MRI of lumbar on Jan 14 2017, severe spinal stenosis at L2-3 with severe left recess stenosis, possible left L3 nerve root compression, severe spinal stenosis at L3-4, no definite nerve root compression, L4-5 mild to moderate spinal stenosis, moderate severe left foraminal narrowing possible L5 nerve root compression  CT abdomen/pelvic showed no significant abnormality  REVIEW OF SYSTEMS: Full 14 system review of systems performed and notable only for as above.  ALLERGIES: Allergies  Allergen Reactions  . Celebrex [Celecoxib]     LEG ACHING   . Codeine Nausea And Vomiting  . Meloxicam     TUMMY ACHE, INEFFECTIVE  . Nortriptyline Other (See Comments)    unknown  . Percocet [Oxycodone-Acetaminophen]     unknown  . Penicillins Rash    Has patient had a PCN reaction causing immediate rash, facial/tongue/throat swelling, SOB or lightheadedness with hypotension: No Has patient had a PCN reaction causing severe rash involving mucus membranes or skin necrosis: No Has patient had a PCN reaction that required hospitalization No Has patient had a PCN reaction occurring within the last 10 years: No If all of the above answers are "NO", then may proceed with Cephalosporin use.    HOME MEDICATIONS: Current Outpatient Prescriptions  Medication Sig Dispense Refill  .  aspirin EC 81 MG tablet Take 81 mg by mouth daily.    . Cholecalciferol (VITAMIN D3) 2000 units TABS Take 1 tablet by mouth daily.    . enalapril (VASOTEC) 20 MG tablet Take 20 mg by mouth daily.    Marland Kitchen gabapentin (NEURONTIN) 100 MG capsule Take 3 capsules (300 mg total) by mouth at bedtime. 90 capsule 6  . hydrochlorothiazide (HYDRODIURIL) 12.5  MG tablet Take 12.5 mg by mouth every other day.    . Multiple Vitamins-Minerals (PRESERVISION AREDS 2) CAPS Take 1 capsule by mouth 2 (two) times daily.     No current facility-administered medications for this visit.     PAST MEDICAL HISTORY: Past Medical History:  Diagnosis Date  . Abnormal barium swallow 09/1999  . Asthma   . Carpal tunnel syndrome   . Erythema nodosum   . H/O Doppler ultrasound    LEFT LEG 11/1997  . Hypertension   . Impaired fasting glucose   . Psoriasis   . Rosacea   . Seizures ([place]) 1980  . Swallowing disorder   . Systolic murmur     PAST SURGICAL HISTORY: Past Surgical History:  Procedure Laterality Date  . CHOLECYSTECTOMY    . COLONOSCOPY  2004  . INCISIONAL HERNIA REPAIR    . TONSILLECTOMY      FAMILY HISTORY: Family History  Problem Relation Age of Onset  . CVA Mother   . Stroke Mother   . Parkinson's disease Father   . Leukemia Brother   . CVA Maternal Grandmother   . Stroke Maternal Grandmother   . Peripheral vascular disease Maternal Grandfather   . Breast cancer Paternal Grandmother   . Diabetes Paternal Grandmother   . CVA Paternal Grandfather   . Stroke Paternal Grandfather   . Pancreatic cancer Paternal Aunt   . Stroke Paternal Aunt   . Lung cancer Paternal Uncle   . Pancreatic cancer Brother     SOCIAL HISTORY:  Social History   Social History  . Marital status: Widowed    Spouse name: N/A  . Number of children: 7  . Years of education: College   Occupational History  . RETIRED RN    Social History Main Topics  . Smoking status: Former Smoker    Quit date: 1982  . Smokeless tobacco: Never Used  . Alcohol use Yes     Comment: 1-2 times per week  . Drug use: No  . Sexual activity: Not on file   Other Topics Concern  . Not on file   Social History Narrative   Lives at home alone.   Right-handed.   Two cups caffeine per day.     PHYSICAL EXAM   Vitals:   01/20/17 1456  BP: (!) 152/85  Pulse: 97    Weight: 181 lb 4 oz (82.2 kg)  Height: 5' 3"  (1.6 m)    Not recorded      Body mass index is 32.11 kg/m.  PHYSICAL EXAMNIATION:  Gen: NAD, conversant, well nourised, obese, well groomed                     Cardiovascular: Regular rate rhythm, no peripheral edema, warm, nontender. Eyes: Conjunctivae clear without exudates or hemorrhage Neck: Supple, no carotid bruits. Pulmonary: Clear to auscultation bilaterally  Abdomen: Tenderness of left upper quadrant and mid abdomen upon deep palpitation, no skin rash, no rebound  NEUROLOGICAL EXAM:  MENTAL STATUS: Speech:    Speech is normal; fluent and spontaneous with normal comprehension.  Cognition:     Orientation to time, place and person     Normal recent and remote memory     Normal Attention span and concentration     Normal Language, naming, repeating,spontaneous speech     Fund of knowledge   CRANIAL NERVES: CN II: Visual fields are full to confrontation. Fundoscopic exam is normal with sharp discs and no vascular changes. Pupils are round equal and briskly reactive to light. CN III, IV, VI: extraocular movement are normal. No ptosis. CN V: Facial sensation is intact to pinprick in all 3 divisions bilaterally. Corneal responses are intact.  CN VII: Face is symmetric with normal eye closure and smile. CN VIII: Hearing is normal to rubbing fingers CN IX, X: Palate elevates symmetrically. Phonation is normal. CN XI: Head turning and shoulder shrug are intact CN XII: Tongue is midline with normal movements and no atrophy.  MOTOR: There is no pronator drift of out-stretched arms. Muscle bulk and tone are normal. Muscle strength is normal.  REFLEXES: Reflexes are 2+ and symmetric at the biceps, triceps, knees, and absent at ankles. Plantar responses are flexor.  SENSORY: Intact to light touch, pinprick, positional sensation and vibratory sensation are intact in fingers and toes.  COORDINATION: Rapid alternating movements  and fine finger movements are intact. There is no dysmetria on finger-to-nose and heel-knee-shin.    GAIT/STANCE: She needs push up to get up from seated position, cautious, unsteady   DIAGNOSTIC DATA (LABS, IMAGING, TESTING) - I reviewed patient records, labs, notes, testing and imaging myself where available.   ASSESSMENT AND PLAN  RECHY BOST is a 83 y.o. female   Left lumbar radiating pain  Multilevel lumbar degenerative changes especially upper lumbar region, L2-3, L3-4 Left abdominal pain  Proceed with MRI of thoracic spine to rule out left thoracic pathology  EMG nerve conduction study  Laboratory evaluation for inflammatory etiology  Referred to pain clinic  Trileptal 150 mg twice a day, low-dose gabapentin 100 mg 3 times during the day     Marcial Pacas, M.D. Ph.D.  [facility] [address] [place], [place] [address] Ph: [phone] Fax: [phone]  CC: Olin Hauser, MD

## 2017-01-21 ENCOUNTER — Telehealth: Payer: Self-pay | Admitting: Neurology

## 2017-01-21 LAB — C-REACTIVE PROTEIN: CRP: 23.4 mg/L — ABNORMAL HIGH (ref 0.0–4.9)

## 2017-01-21 LAB — CBC WITH DIFFERENTIAL/PLATELET
BASOS ABS: 0 10*3/uL (ref 0.0–0.2)
Basos: 1 %
EOS (ABSOLUTE): 0.3 10*3/uL (ref 0.0–0.4)
Eos: 4 %
HEMATOCRIT: 40.1 % (ref 34.0–46.6)
Hemoglobin: 12.9 g/dL (ref 11.1–15.9)
Immature Grans (Abs): 0 10*3/uL (ref 0.0–0.1)
Immature Granulocytes: 0 %
LYMPHS ABS: 1.8 10*3/uL (ref 0.7–3.1)
Lymphs: 21 %
MCH: 28.5 pg (ref 26.6–33.0)
MCHC: 32.2 g/dL (ref 31.5–35.7)
MCV: 89 fL (ref 79–97)
MONOS ABS: 1.1 10*3/uL — AB (ref 0.1–0.9)
Monocytes: 13 %
NEUTROS PCT: 61 %
Neutrophils Absolute: 5.4 10*3/uL (ref 1.4–7.0)
PLATELETS: 343 10*3/uL (ref 150–379)
RBC: 4.53 x10E6/uL (ref 3.77–5.28)
RDW: 15.3 % (ref 12.3–15.4)
WBC: 8.7 10*3/uL (ref 3.4–10.8)

## 2017-01-21 LAB — COMPREHENSIVE METABOLIC PANEL
ALBUMIN: 3.8 g/dL (ref 3.5–4.7)
ALK PHOS: 111 IU/L (ref 39–117)
ALT: 18 IU/L (ref 0–32)
AST: 16 IU/L (ref 0–40)
Albumin/Globulin Ratio: 1.3 (ref 1.2–2.2)
BILIRUBIN TOTAL: 0.2 mg/dL (ref 0.0–1.2)
BUN / CREAT RATIO: 23 (ref 12–28)
BUN: 13 mg/dL (ref 8–27)
CHLORIDE: 100 mmol/L (ref 96–106)
CO2: 27 mmol/L (ref 18–29)
Calcium: 9.5 mg/dL (ref 8.7–10.3)
Creatinine, Ser: 0.57 mg/dL (ref 0.57–1.00)
GFR calc Af Amer: 101 mL/min/{1.73_m2} (ref >59)
GFR calc non Af Amer: 88 mL/min/{1.73_m2} (ref >59)
GLOBULIN, TOTAL: 2.9 g/dL (ref 1.5–4.5)
Glucose: 106 mg/dL — ABNORMAL HIGH (ref 65–99)
POTASSIUM: 4.6 mmol/L (ref 3.5–5.2)
SODIUM: 139 mmol/L (ref 134–144)
Total Protein: 6.7 g/dL (ref 6.0–8.5)

## 2017-01-21 LAB — CK: CK TOTAL: 60 U/L (ref 24–173)

## 2017-01-21 LAB — VITAMIN B12: VITAMIN B 12: 367 pg/mL (ref 232–1245)

## 2017-01-21 LAB — ANA W/REFLEX: ANA: NEGATIVE

## 2017-01-21 LAB — SEDIMENTATION RATE: Sed Rate: 18 mm/hr (ref 0–40)

## 2017-01-21 LAB — TSH: TSH: 1.6 u[IU]/mL (ref 0.450–4.500)

## 2017-01-21 NOTE — Telephone Encounter (Signed)
Spoke to patient - she took the gabapentin last night at bedtime and started the Trileptal this morning.  Says she has noticed mild improvement with hopes for better pain control once she is taking the medications regularly.  She will call us back if they are unhelpful.  She was also notified of her lab results.

## 2017-01-21 NOTE — Telephone Encounter (Signed)
Please call patient, laboratory evaluation showed  elevated C reactive protein 23, but with normal ESR 18, elevated C reactive protein is usually associated with inflammatory process, may consider repeat laboratory evaluation at next follow-up visit  Rest of the laboratory evaluation showed no significant abnormality  Also check to see if Trileptal and gabapentin has helped her pain

## 2017-01-26 ENCOUNTER — Ambulatory Visit: Payer: Medicare Other | Admitting: Neurology

## 2017-02-01 ENCOUNTER — Telehealth: Payer: Self-pay | Admitting: Neurology

## 2017-02-01 NOTE — Telephone Encounter (Signed)
Pt is wanting to add xray for the left hip since she is having severe pain.

## 2017-02-01 NOTE — Telephone Encounter (Signed)
Please call patient when she had CT of abdomen and pelvic, left hip was included in the imaging, I personally reviewed the film, and also at the radiologist's report, there was no significant asymmetry and the pathology noticed.

## 2017-02-01 NOTE — Telephone Encounter (Signed)
Patient aware of results.

## 2017-02-02 ENCOUNTER — Ambulatory Visit
Admission: RE | Admit: 2017-02-02 | Discharge: 2017-02-02 | Disposition: A | Discharge status: Home / Self Care | Payer: Medicare Other | Source: Ambulatory Visit | Attending: Neurology | Admitting: Neurology

## 2017-02-02 DIAGNOSIS — M5442 Lumbago with sciatica, left side: Secondary | ICD-10-CM

## 2017-02-02 DIAGNOSIS — R1012 Left upper quadrant pain: Secondary | ICD-10-CM

## 2017-02-03 ENCOUNTER — Telehealth: Payer: Self-pay | Admitting: Neurology

## 2017-02-03 NOTE — Telephone Encounter (Signed)
I called patient. MRI of the thoracic spine is relatively unremarkable, no evidence of spinal cord or nerve root impingement. No evidence of source of discomfort.   MRI thoracic 02/02/17:  IMPRESSION:  This MRI of the thoracic spine without contrast shows the following: 1.    There are small disc protrusions at T7-T8 and T8-T9 and a small central disc bulge at T10-T11. These do not lead to significant foraminal narrowing or nerve root compression. 2.    On the scout images, mild anterolisthesis of C5 upon C6 and mild retrolisthesis of C6 upon C7 is noted. 3.    The spinal cord appears normal.

## 2017-02-25 ENCOUNTER — Ambulatory Visit: Payer: Medicare Other | Admitting: Nurse Practitioner

## 2017-02-25 ENCOUNTER — Telehealth: Payer: Self-pay | Admitting: Neurology

## 2017-02-25 NOTE — Telephone Encounter (Signed)
Weston BrassNick with [facility] called office in reference to patients OXcarbazepine (TRILEPTAL) 150 MG tablet.  Weston Brassick said patient stated she is taking medication 3 times a day not 2.  Per Weston Brassick if this is correct they will need a new RX with corrected instructions.  Please call

## 2017-02-25 NOTE — Telephone Encounter (Signed)
Spoke to patient - she is currently taking taking Tripletal 150mg  BID and gabapentin 300mg , qhs.  Although her pain is not completely resolved, she feels the medication is helpful in keeping it tolerable.  Also, she received an injection of dexamethasone on 02/24/17.  She has a pending appt with Dr. Venetia MaxonStern on 03/08/17.  I called the pharmacy back to let them know she will continue the BID dosing schedule.

## 2017-03-08 ENCOUNTER — Emergency Department (HOSPITAL_COMMUNITY): Payer: Medicare Other

## 2017-03-08 ENCOUNTER — Inpatient Hospital Stay (HOSPITAL_COMMUNITY)
Admission: EM | Admit: 2017-03-08 | Discharge: 2017-03-10 | DRG: 244 | Disposition: A | Discharge status: Home / Self Care | Payer: Medicare Other | Attending: Internal Medicine | Admitting: Internal Medicine

## 2017-03-08 ENCOUNTER — Encounter (HOSPITAL_COMMUNITY): Payer: Self-pay | Admitting: Nurse Practitioner

## 2017-03-08 DIAGNOSIS — J45909 Unspecified asthma, uncomplicated: Secondary | ICD-10-CM | POA: Diagnosis present

## 2017-03-08 DIAGNOSIS — Z885 Allergy status to narcotic agent status: Secondary | ICD-10-CM | POA: Diagnosis not present

## 2017-03-08 DIAGNOSIS — Z888 Allergy status to other drugs, medicaments and biological substances status: Secondary | ICD-10-CM | POA: Diagnosis not present

## 2017-03-08 DIAGNOSIS — I252 Old myocardial infarction: Secondary | ICD-10-CM | POA: Diagnosis not present

## 2017-03-08 DIAGNOSIS — I10 Essential (primary) hypertension: Secondary | ICD-10-CM | POA: Diagnosis present

## 2017-03-08 DIAGNOSIS — R0609 Other forms of dyspnea: Secondary | ICD-10-CM

## 2017-03-08 DIAGNOSIS — Z8709 Personal history of other diseases of the respiratory system: Secondary | ICD-10-CM

## 2017-03-08 DIAGNOSIS — Z959 Presence of cardiac and vascular implant and graft, unspecified: Secondary | ICD-10-CM

## 2017-03-08 DIAGNOSIS — M7989 Other specified soft tissue disorders: Secondary | ICD-10-CM | POA: Diagnosis not present

## 2017-03-08 DIAGNOSIS — Z8249 Family history of ischemic heart disease and other diseases of the circulatory system: Secondary | ICD-10-CM

## 2017-03-08 DIAGNOSIS — Z823 Family history of stroke: Secondary | ICD-10-CM

## 2017-03-08 DIAGNOSIS — I441 Atrioventricular block, second degree: Principal | ICD-10-CM | POA: Diagnosis present

## 2017-03-08 DIAGNOSIS — R0600 Dyspnea, unspecified: Secondary | ICD-10-CM | POA: Diagnosis present

## 2017-03-08 DIAGNOSIS — I4430 Unspecified atrioventricular block: Secondary | ICD-10-CM | POA: Diagnosis present

## 2017-03-08 DIAGNOSIS — M4800 Spinal stenosis, site unspecified: Secondary | ICD-10-CM | POA: Diagnosis present

## 2017-03-08 LAB — CBC
HCT: 34.7 % — ABNORMAL LOW (ref 36.0–46.0)
HEMOGLOBIN: 11.6 g/dL — AB (ref 12.0–15.0)
MCH: 29.1 pg (ref 26.0–34.0)
MCHC: 33.4 g/dL (ref 30.0–36.0)
MCV: 87 fL (ref 78.0–100.0)
PLATELETS: 289 10*3/uL (ref 150–400)
RBC: 3.99 MIL/uL (ref 3.87–5.11)
RDW: 15.4 % (ref 11.5–15.5)
WBC: 10.1 10*3/uL (ref 4.0–10.5)

## 2017-03-08 LAB — BASIC METABOLIC PANEL
ANION GAP: 9 (ref 5–15)
BUN: 11 mg/dL (ref 6–20)
CALCIUM: 9.1 mg/dL (ref 8.9–10.3)
CO2: 24 mmol/L (ref 22–32)
CREATININE: 0.55 mg/dL (ref 0.44–1.00)
Chloride: 102 mmol/L (ref 101–111)
Glucose, Bld: 107 mg/dL — ABNORMAL HIGH (ref 65–99)
Potassium: 3.7 mmol/L (ref 3.5–5.1)
SODIUM: 135 mmol/L (ref 135–145)

## 2017-03-08 LAB — I-STAT CHEM 8, ED
BUN: 11 mg/dL (ref 6–20)
CREATININE: 0.5 mg/dL (ref 0.44–1.00)
Calcium, Ion: 1.13 mmol/L — ABNORMAL LOW (ref 1.15–1.40)
Chloride: 100 mmol/L — ABNORMAL LOW (ref 101–111)
Glucose, Bld: 103 mg/dL — ABNORMAL HIGH (ref 65–99)
HEMATOCRIT: 36 % (ref 36.0–46.0)
HEMOGLOBIN: 12.2 g/dL (ref 12.0–15.0)
POTASSIUM: 3.8 mmol/L (ref 3.5–5.1)
SODIUM: 136 mmol/L (ref 135–145)
TCO2: 26 mmol/L (ref 0–100)

## 2017-03-08 LAB — MAGNESIUM: MAGNESIUM: 2 mg/dL (ref 1.7–2.4)

## 2017-03-08 LAB — TSH: TSH: 1.754 u[IU]/mL (ref 0.350–4.500)

## 2017-03-08 LAB — I-STAT TROPONIN, ED: TROPONIN I, POC: 0.04 ng/mL (ref 0.00–0.08)

## 2017-03-08 MED ORDER — ACETAMINOPHEN 325 MG PO TABS
650.0000 mg | ORAL_TABLET | ORAL | Status: DC | PRN
  Administered 2017-03-09: 650 mg via ORAL
  Filled 2017-03-08: qty 2

## 2017-03-08 MED ORDER — NITROGLYCERIN 0.4 MG SL SUBL: 0.4000 mg | SUBLINGUAL_TABLET | SUBLINGUAL | Status: DC | PRN

## 2017-03-08 MED ORDER — GABAPENTIN 300 MG PO CAPS
300.0000 mg | ORAL_CAPSULE | Freq: Every day | ORAL | Status: DC
  Administered 2017-03-08 – 2017-03-09 (×2): 300 mg via ORAL
  Filled 2017-03-08 (×2): qty 1

## 2017-03-08 MED ORDER — ZOLPIDEM TARTRATE 5 MG PO TABS: 5.0000 mg | ORAL_TABLET | Freq: Every evening | ORAL | Status: DC | PRN

## 2017-03-08 MED ORDER — HEPARIN SODIUM (PORCINE) 5000 UNIT/ML IJ SOLN
5000.0000 [IU] | Freq: Three times a day (TID) | INTRAMUSCULAR | Status: DC
  Administered 2017-03-08 – 2017-03-09 (×2): 5000 [IU] via SUBCUTANEOUS
  Filled 2017-03-08 (×2): qty 1

## 2017-03-08 MED ORDER — ASPIRIN 81 MG PO CHEW
324.0000 mg | CHEWABLE_TABLET | ORAL | Status: AC
  Administered 2017-03-08: 324 mg via ORAL
  Filled 2017-03-08: qty 4

## 2017-03-08 MED ORDER — ASPIRIN EC 81 MG PO TBEC
81.0000 mg | DELAYED_RELEASE_TABLET | Freq: Every day | ORAL | Status: DC
  Administered 2017-03-09: 81 mg via ORAL
  Filled 2017-03-08: qty 1

## 2017-03-08 MED ORDER — SODIUM CHLORIDE 0.9 % IV SOLN: 250.0000 mL | INTRAVENOUS | Status: DC | PRN

## 2017-03-08 MED ORDER — ASPIRIN 300 MG RE SUPP: 300.0000 mg | RECTAL | Status: AC

## 2017-03-08 MED ORDER — ONDANSETRON HCL 4 MG/2ML IJ SOLN: 4.0000 mg | Freq: Four times a day (QID) | INTRAMUSCULAR | Status: DC | PRN

## 2017-03-08 NOTE — ED Triage Notes (Signed)
Pt presents with c/o bradycardia. She was in neurosurgery office this morning for pre surgical appointment for a lumbar fusion. They found her pulse rate in the 40s and she reported recent shortness of breath so she was sent to ED for further evaluation.

## 2017-03-08 NOTE — ED Notes (Signed)
MD at bedside. 

## 2017-03-08 NOTE — H&P (Signed)
Cardiology Admission History and Physical:   Patient ID: Sharon Gilmore; MRN: [id number]; DOB: Aug 25, 1933   Admission date: 03/08/2017  Primary Care Provider: Olivia CanterKelly, William, MD Primary Cardiologist: New Primary Electrophysiologist:  New  Chief Complaint:  DOE, weakness  Patient Profile:   Sharon EvaJudith A Gilmore is a 83 y.o. female sent by Dr Venetia MaxonStern to the ED with a HR of 44.   History of Present Illness:   Sharon Gilmore is an 83 y/o female with a history of HTN and spinal stenosis. She was seen by Norma FredricksonLori Gerhardt in Oct 2017 for complaints of DOE. She had a systolic murmur and had an abnormal EKG with an old lateral MI. A Myoview and echo were done. Myoview was low risk, echo showed preserved LVF and AOV Ca++ without restriction. The pt was reassured. She has never had chest pain.   [place] She has noticed increasing weakness and fatigue with exertion over the past few months. She also developed Lt Leg pain and was evaluated and found to have spinal stenosis. She was referred to Dr Venetia MaxonStern. In his office her HR was noted to be 44, not normal for her. She denies syncope. She noted her pulse was slow yesterday. In the ED her EKG shows 2:1 AVB.   Past Medical History:  Diagnosis Date  . Abnormal barium swallow 09/1999  . Asthma   . Carpal tunnel syndrome   . Erythema nodosum   . H/O Doppler ultrasound    LEFT LEG 11/1997  . Hypertension   . Impaired fasting glucose   . Psoriasis   . Rosacea   . Seizures (HCC) 1980  . Swallowing disorder   . Systolic murmur     Past Surgical History:  Procedure Laterality Date  . CHOLECYSTECTOMY    . COLONOSCOPY  2004  . INCISIONAL HERNIA REPAIR    . TONSILLECTOMY       Medications Prior to Admission: Prior to Admission medications   Medication Sig Start Date End Date Taking? Authorizing Provider  Artificial Tear Ointment (DRY EYES OP) Place 1 drop into both eyes daily as needed (dry  eye).   Yes [provider]  aspirin EC 81 MG tablet Take 81 mg by mouth at bedtime.    Yes [provider]  Cholecalciferol (VITAMIN D3) 2000 units TABS Take 1 tablet by mouth daily.   Yes [provider]  enalapril (VASOTEC) 20 MG tablet Take 20 mg by mouth daily.   Yes [provider]  gabapentin (NEURONTIN) 100 MG capsule Take 3 capsules (300 mg total) by mouth at bedtime. 01/04/17  Yes Levert FeinsteinYan, Yijun, MD  hydrochlorothiazide (HYDRODIURIL) 12.5 MG tablet Take 12.5 mg by mouth every other day.   Yes [provider]  lidocaine (LMX) 4 % cream Apply 1 application topically as needed. 01/20/17  Yes Levert FeinsteinYan, Yijun, MD  Multiple Vitamins-Minerals (PRESERVISION AREDS 2) CAPS Take 1 capsule by mouth 2 (two) times daily.   Yes [provider]  naproxen sodium (ALEVE) 220 MG tablet Take 220 mg by mouth 2 (two) times daily with a meal.   Yes [provider]  OXcarbazepine (TRILEPTAL) 150 MG tablet Take 1 tablet (150 mg total) by mouth 2 (two) times daily. Patient taking differently: Take 150 mg by mouth daily.  01/20/17  Yes Levert FeinsteinYan, Yijun, MD     Allergies:    Allergies  Allergen Reactions  . Celebrex [Celecoxib]     LEG ACHING   .  Codeine Nausea And Vomiting  . Meloxicam     TUMMY ACHE, INEFFECTIVE  . Nortriptyline Other (See Comments)    unknown  . Percocet [Oxycodone-Acetaminophen]     unknown  . Penicillins Rash    Has patient had a PCN reaction causing immediate rash, facial/tongue/throat swelling, SOB or lightheadedness with hypotension: No Has patient had a PCN reaction causing severe rash involving mucus membranes or skin necrosis: No Has patient had a PCN reaction that required hospitalization No Has patient had a PCN reaction occurring within the last 10 years: No If all of the above answers are "NO", then may proceed with Cephalosporin use.    Social History:   Social History   Social History  . Marital status: Widowed     Spouse name: N/A  . Number of children: 7  . Years of education: College   Occupational History  . RETIRED RN    Social History Main Topics  . Smoking status: Former Smoker    Quit date: 1982  . Smokeless tobacco: Never Used  . Alcohol use Yes     Comment: 1-2 times per week  . Drug use: No  . Sexual activity: Not on file   Other Topics Concern  . Not on file   Social History Narrative   Lives at home alone.   Right-handed.   Two cups caffeine per day.    Family History:   Brother had a stent 20 yrs ago The patient's family history includes Breast cancer in her paternal grandmother; CVA in her maternal grandmother, mother, and paternal grandfather; Coronary artery disease in her brother; Diabetes in her paternal grandmother; Leukemia in her brother; Lung cancer in her paternal uncle; Pancreatic cancer in her brother and paternal aunt; Parkinson's disease in her father; Peripheral vascular disease in her maternal grandfather; Stroke in her maternal grandmother, mother, paternal aunt, and paternal grandfather.    ROS:  Please see the history of present illness.  Her asthma has not bothered her.  All other ROS reviewed and negative.     Physical Exam/Data:   Vitals:   03/08/17 1546 03/08/17 1645 03/08/17 1700  BP: (!) 199/59 (!) 197/68 (!) 191/99  Pulse: (!) 43 (!) 45 (!) 44  Resp: 15 14 17   Temp: 98.3 F (36.8 C)    TempSrc: Oral    SpO2: 96% 99% 100%   No intake or output data in the 24 hours ending 03/08/17 1725 There were no vitals filed for this visit. There is no height or weight on file to calculate BMI.  General:  Well nourished, well developed, in no acute distress HEENT: normal Lymph: no adenopathy Neck: no JVD Endocrine:  No thryomegaly Vascular: No carotid bruits; RLE DP pulse 2+, diminished LLE pulses, FA without bruits  Cardiac:  normal S1, S2; RRR; 2/6 systolic murmur AOV Lungs:  clear to auscultation bilaterally, no wheezing, rhonchi or rales  Abd:  soft, nontender, no hepatomegaly  Ext: trace  Edema LLE Musculoskeletal:  No deformities, BUE and BLE strength normal and equal Skin: warm and dry  Neuro:  CNs 2-12 intact, no focal abnormalities noted Psych:  Normal affect    EKG:  The ECG that was done 7/23/187 was personally reviewed and demonstrates 2:1 AVB with VR 44  Relevant CV Studies: Echo 06/11/17- Study Conclusions  - Left ventricle: The cavity size was normal. There was moderate   focal basal and mild concentric hypertrophy. Systolic function   was normal. The estimated ejection fraction was in  the range of   60% to 65%. Wall motion was normal; there were no regional wall   motion abnormalities. There was an increased relative   contribution of atrial contraction to ventricular filling.   Doppler parameters are consistent with abnormal left ventricular   relaxation (grade 1 diastolic dysfunction). - Aortic valve: Poorly visualized.  Myoview 06/09/17-  Nuclear stress EF: 68%.  There was no ST segment deviation noted during stress.  The study is normal.  The left ventricular ejection fraction is hyperdynamic (>65%).   Normal stress nuclear study with no ischemia or infarction; EF 68 with normal wall motion.   Laboratory Data:  Chemistry  Recent Labs Lab 03/08/17 1605 03/08/17 1632  NA 135 136  K 3.7 3.8  CL 102 100*  CO2 24  --   GLUCOSE 107* 103*  BUN 11 11  CREATININE 0.55 0.50  CALCIUM 9.1  --   GFRNONAA >60  --   GFRAA >60  --   ANIONGAP 9  --     No results for input(s): PROT, ALBUMIN, AST, ALT, ALKPHOS, BILITOT in the last 168 hours. Hematology  Recent Labs Lab 03/08/17 1605 03/08/17 1632  WBC 10.1  --   RBC 3.99  --   HGB 11.6* 12.2  HCT 34.7* 36.0  MCV 87.0  --   MCH 29.1  --   MCHC 33.4  --   RDW 15.4  --   PLT 289  --    Cardiac EnzymesNo results for input(s): TROPONINI in the last 168 hours.   Recent Labs Lab 03/08/17 1627  TROPIPOC 0.04    BNPNo results for  input(s): BNP, PROBNP in the last 168 hours.  DDimer No results for input(s): DDIMER in the last 168 hours.  Radiology/Studies:  Dg Chest Portable 1 View  Result Date: 03/08/2017 CLINICAL DATA:  Shortness of breath. EXAM: PORTABLE CHEST 1 VIEW COMPARISON:  MRI of the thoracic spine dated 02/02/2017 FINDINGS: The heart size and pulmonary vascularity are normal and the lungs are clear. Slight elevation of the left hemidiaphragm is felt to be unchanged since 02/02/2017. No effusion. No acute bone abnormality. IMPRESSION: No significant abnormalities. Electronically Signed   By: Francene Boyers M.D.   On: 03/08/2017 17:01    Assessment and Plan:   1. 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 HTN B/P may be falsely elevated secondary to heart block  3. DOE I'm concerned she may have underlying CAD despite Myoview result from Oct  4. Spinal stenosis It sounds like she was heading toward surgery- refered to Dr Venetia Maxon  Plan: Dr Ladona Ridgel to see. Further recommendations to follow. ? Cath/ pacemaker   Severity of Illness: The appropriate patient status for this patient is INPATIENT. Inpatient status is judged to be reasonable and necessary in order to provide the required intensity of service to ensure the patient's safety. The patient's presenting symptoms, physical exam findings, and initial radiographic and laboratory data in the context of their chronic comorbidities is felt to place them at high risk for further clinical deterioration. Furthermore, it is not anticipated that the patient will be medically stable for discharge from the hospital within 2 midnights of admission. The following factors support the patient status of inpatient.   " The patient's presenting symptoms include dyspnea, weakness. " The worrisome physical exam findings include decreased pulses LLE and an EKG that shows new 2:1 AVB " The initial radiographic and laboratory data are worrisome because of " The  chronic co-morbidities include. HTN, DJD   * I certify that at the point of admission it is my clinical judgment that the patient will require inpatient hospital care spanning beyond 2 midnights from the point of admission due to high intensity of service, high risk for further deterioration and high frequency of surveillance required.*    Signed, Corine Shelter, PA-C  03/08/2017 5:25 PM   EP Attending  Patient seen and examined. Agree with above. The patient presents with symptomatic 2:1 AV block. She has had some peripheral edema and also has some spinal stenosis and is pending possible surgery. I have recommended she undergo PPM insertion. Her pulses are reportedly down on the left. I gues arterial dopplers would be reasonable. I strongly suspect her dyspnea is due to her 2:1 AV block. She does not have angina.   Leonia Reeves.D.

## 2017-03-08 NOTE — ED Provider Notes (Signed)
[facility]-EMERGENCY DEPT Provider Note   CSN: [id number] Arrival date & time: 03/08/17  1453     History   Chief Complaint Chief Complaint  Patient presents with  . Shortness of Breath  . Bradycardia    HPI Sharon Gilmore is a 83 y.o. female.  HPI Patient was sent in for bradycardia. Was at neurosurgery office and found of heart rate in the 40s. She's been fatigued for the last couple months. Worsening shortness of breath with exertion. No chest pain. No swelling. She thought it was due to the back pain and sciatica. No chest pain. Upon arrival EKG shows 2-1 heart block. No history of same. Not on nodal blocking agents. Had an echocardiogram under a year ago. Past Medical History:  Diagnosis Date  . Abnormal barium swallow 09/1999  . Asthma   . Carpal tunnel syndrome   . Erythema nodosum   . H/O Doppler ultrasound    LEFT LEG 11/1997  . Hypertension   . Impaired fasting glucose   . Psoriasis   . Rosacea   . Seizures (HCC) 1980  . Swallowing disorder   . Systolic murmur     Patient Active Problem List   Diagnosis Date Noted  . DOE (dyspnea on exertion) 03/08/2017  . AV block, 2nd degree 03/08/2017  . Essential hypertension 03/08/2017  . Family history of coronary artery disease in brother 03/08/2017  . History of asthma 03/08/2017  . Abdominal pain 01/04/2017  . Spinal stenosis 01/04/2017    Past Surgical History:  Procedure Laterality Date  . CHOLECYSTECTOMY    . COLONOSCOPY  2004  . INCISIONAL HERNIA REPAIR    . TONSILLECTOMY      OB History    No data available       Home Medications    Prior to Admission medications   Medication Sig Start Date End Date Taking? Authorizing Provider  Artificial Tear Ointment (DRY EYES OP) Place 1 drop into both eyes daily as needed (dry eye).   Yes [provider]  aspirin EC 81 MG tablet Take 81 mg by mouth at bedtime.    Yes [provider]  Cholecalciferol (VITAMIN D3) 2000 units TABS Take 1  tablet by mouth daily.   Yes [provider]  enalapril (VASOTEC) 20 MG tablet Take 20 mg by mouth daily.   Yes [provider]  gabapentin (NEURONTIN) 100 MG capsule Take 3 capsules (300 mg total) by mouth at bedtime. 01/04/17  Yes Levert Feinstein, MD  hydrochlorothiazide (HYDRODIURIL) 12.5 MG tablet Take 12.5 mg by mouth every other day.   Yes [provider]  lidocaine (LMX) 4 % cream Apply 1 application topically as needed. 01/20/17  Yes Levert Feinstein, MD  Multiple Vitamins-Minerals (PRESERVISION AREDS 2) CAPS Take 1 capsule by mouth 2 (two) times daily.   Yes [provider]  naproxen sodium (ALEVE) 220 MG tablet Take 220 mg by mouth 2 (two) times daily with a meal.   Yes [provider]  OXcarbazepine (TRILEPTAL) 150 MG tablet Take 1 tablet (150 mg total) by mouth 2 (two) times daily. Patient taking differently: Take 150 mg by mouth daily.  01/20/17  Yes Levert Feinstein, MD    Family History Family History  Problem Relation Age of Onset  . CVA Mother   . Stroke Mother   . Parkinson's disease Father   . Leukemia Brother   . CVA Maternal Grandmother   . Stroke Maternal Grandmother   . Peripheral vascular disease Maternal Grandfather   .  Breast cancer Paternal Grandmother   . Diabetes Paternal Grandmother   . CVA Paternal Grandfather   . Stroke Paternal Grandfather   . Pancreatic cancer Paternal Aunt   . Stroke Paternal Aunt   . Lung cancer Paternal Uncle   . Pancreatic cancer Brother     Social History Social History  Substance Use Topics  . Smoking status: Former Smoker    Quit date: 1982  . Smokeless tobacco: Never Used  . Alcohol use Yes     Comment: 1-2 times per week     Allergies   Celebrex [celecoxib]; Codeine; Meloxicam; Nortriptyline; Percocet [oxycodone-acetaminophen]; and Penicillins   Review of Systems Review of Systems  Constitutional: Positive for fatigue. Negative for appetite change.  Respiratory: Positive for shortness  of breath.   Cardiovascular: Negative for chest pain and leg swelling.  Gastrointestinal: Negative for abdominal pain.  Genitourinary: Negative for flank pain.  Musculoskeletal: Positive for back pain.  Skin: Negative for wound.  Neurological: Positive for light-headedness. Negative for weakness.  Psychiatric/Behavioral: Negative for confusion.     Physical Exam Updated Vital Signs BP (!) 191/99   Pulse (!) 44   Temp 98.3 F (36.8 C) (Oral)   Resp 17   SpO2 100%   Physical Exam  Constitutional: She appears well-developed and well-nourished.  HENT:  Head: Normocephalic.  Eyes: Pupils are equal, round, and reactive to light.  Neck: Neck supple.  Cardiovascular: Normal rate.   Pulmonary/Chest: She has no wheezes.  Abdominal: There is no tenderness.  Musculoskeletal: She exhibits no edema.  Neurological: She is alert.  Skin: Skin is warm.  Psychiatric: She has a normal mood and affect.     ED Treatments / Results  Labs (all labs ordered are listed, but only abnormal results are displayed) Labs Reviewed  BASIC METABOLIC PANEL - Abnormal; Notable for the following:       Result Value   Glucose, Bld 107 (*)    All other components within normal limits  CBC - Abnormal; Notable for the following:    Hemoglobin 11.6 (*)    HCT 34.7 (*)    All other components within normal limits  I-STAT CHEM 8, ED - Abnormal; Notable for the following:    Chloride 100 (*)    Glucose, Bld 103 (*)    Calcium, Ion 1.13 (*)    All other components within normal limits  TSH  MAGNESIUM  I-STAT TROPONIN, ED    EKG  EKG Interpretation  Date/Time:  Monday March 08 2017 16:13:34 EDT Ventricular Rate:  46 PR Interval:  162 QRS Duration: 82 QT Interval:  464 QTC Calculation: 406 R Axis:   64 Text Interpretation:  with 2:1 A-V conduction Right atrial enlargement Borderline ECG Confirmed by Benjiman CorePickering, Tauna Macfarlane [phone]([id number]) on 03/08/2017 4:20:28 PM       Radiology Dg Chest Portable 1  View  Result Date: 03/08/2017 CLINICAL DATA:  Shortness of breath. EXAM: PORTABLE CHEST 1 VIEW COMPARISON:  MRI of the thoracic spine dated 02/02/2017 FINDINGS: The heart size and pulmonary vascularity are normal and the lungs are clear. Slight elevation of the left hemidiaphragm is felt to be unchanged since 02/02/2017. No effusion. No acute bone abnormality. IMPRESSION: No significant abnormalities. Electronically Signed   By: Francene BoyersJames  Maxwell M.D.   On: 03/08/2017 17:01    Procedures Procedures (including critical care time)  Medications Ordered in ED Medications - No data to display   Initial Impression / Assessment and Plan / ED Course  I have reviewed  the triage vital signs and the nursing notes.  Pertinent labs & imaging results that were available during my care of the patient were reviewed by me and considered in my medical decision making (see chart for details).    Patient with second-degree heart block. Blood pressure maintained. Rhythm the 40s. Has been symptomatic though. Will admit to cardiology. Final Clinical Impressions(s) / ED Diagnoses   Final diagnoses:  AV block, 2nd degree    New Prescriptions New Prescriptions   No medications on file     Benjiman Core, MD 03/08/17 1725

## 2017-03-08 NOTE — ED Notes (Signed)
Attempted to call report

## 2017-03-09 ENCOUNTER — Inpatient Hospital Stay (HOSPITAL_COMMUNITY)
Admission: EM | Disposition: A | Discharge status: Home / Self Care | Payer: Self-pay | Source: Home / Self Care | Attending: Internal Medicine

## 2017-03-09 ENCOUNTER — Inpatient Hospital Stay (HOSPITAL_COMMUNITY): Payer: Medicare Other

## 2017-03-09 DIAGNOSIS — R0609 Other forms of dyspnea: Secondary | ICD-10-CM

## 2017-03-09 DIAGNOSIS — I441 Atrioventricular block, second degree: Secondary | ICD-10-CM

## 2017-03-09 DIAGNOSIS — M7989 Other specified soft tissue disorders: Secondary | ICD-10-CM

## 2017-03-09 HISTORY — PX: PACEMAKER IMPLANT: EP1218

## 2017-03-09 LAB — CBC WITH DIFFERENTIAL/PLATELET
Basophils Absolute: 0 10*3/uL (ref 0.0–0.1)
Basophils Relative: 1 %
Eosinophils Absolute: 0.4 10*3/uL (ref 0.0–0.7)
Eosinophils Relative: 5 %
HCT: 33.9 % — ABNORMAL LOW (ref 36.0–46.0)
Hemoglobin: 11.2 g/dL — ABNORMAL LOW (ref 12.0–15.0)
Lymphocytes Relative: 24 %
Lymphs Abs: 1.9 10*3/uL (ref 0.7–4.0)
MCH: 28.9 pg (ref 26.0–34.0)
MCHC: 33 g/dL (ref 30.0–36.0)
MCV: 87.6 fL (ref 78.0–100.0)
Monocytes Absolute: 0.6 10*3/uL (ref 0.1–1.0)
Monocytes Relative: 8 %
Neutro Abs: 4.8 10*3/uL (ref 1.7–7.7)
Neutrophils Relative %: 62 %
Platelets: 258 10*3/uL (ref 150–400)
RBC: 3.87 MIL/uL (ref 3.87–5.11)
RDW: 15.8 % — ABNORMAL HIGH (ref 11.5–15.5)
WBC: 7.7 10*3/uL (ref 4.0–10.5)

## 2017-03-09 LAB — LIPID PANEL
Cholesterol: 111 mg/dL (ref 0–200)
HDL: 47 mg/dL (ref >40)
LDL Cholesterol: 50 mg/dL (ref 0–99)
Total CHOL/HDL Ratio: 2.4 RATIO
Triglycerides: 69 mg/dL (ref <150)
VLDL: 14 mg/dL (ref 0–40)

## 2017-03-09 LAB — PROTIME-INR
INR: 1.27
Prothrombin Time: 15.9 seconds — ABNORMAL HIGH (ref 11.4–15.2)

## 2017-03-09 LAB — MRSA PCR SCREENING: MRSA BY PCR: NEGATIVE

## 2017-03-09 LAB — TSH: TSH: 2.389 u[IU]/mL (ref 0.350–4.500)

## 2017-03-09 LAB — MAGNESIUM: Magnesium: 2 mg/dL (ref 1.7–2.4)

## 2017-03-09 SURGERY — PACEMAKER IMPLANT
Anesthesia: LOCAL

## 2017-03-09 MED ORDER — SODIUM CHLORIDE 0.9 % IR SOLN
80.0000 mg | Status: DC
  Filled 2017-03-09: qty 2

## 2017-03-09 MED ORDER — OCUVITE-LUTEIN PO CAPS
1.0000 | ORAL_CAPSULE | Freq: Two times a day (BID) | ORAL | Status: DC
  Filled 2017-03-09 (×3): qty 1

## 2017-03-09 MED ORDER — HEPARIN (PORCINE) IN NACL 2-0.9 UNIT/ML-% IJ SOLN
INTRAMUSCULAR | Status: AC
  Filled 2017-03-09: qty 500

## 2017-03-09 MED ORDER — SODIUM CHLORIDE 0.9 % IR SOLN
Status: DC | PRN
  Administered 2017-03-09: 500 mL

## 2017-03-09 MED ORDER — VANCOMYCIN HCL IN DEXTROSE 1-5 GM/200ML-% IV SOLN
1000.0000 mg | INTRAVENOUS | Status: DC
  Filled 2017-03-09: qty 200

## 2017-03-09 MED ORDER — LIDOCAINE HCL (PF) 1 % IJ SOLN
INTRAMUSCULAR | Status: DC | PRN
  Administered 2017-03-09: 40 mL via INTRADERMAL

## 2017-03-09 MED ORDER — GABAPENTIN 300 MG PO CAPS: 300.0000 mg | ORAL_CAPSULE | Freq: Every day | ORAL | Status: DC

## 2017-03-09 MED ORDER — MIDAZOLAM HCL 5 MG/5ML IJ SOLN
INTRAMUSCULAR | Status: AC
  Filled 2017-03-09: qty 5

## 2017-03-09 MED ORDER — ENALAPRIL MALEATE 20 MG PO TABS
20.0000 mg | ORAL_TABLET | Freq: Every day | ORAL | Status: DC
  Administered 2017-03-09 – 2017-03-10 (×2): 20 mg via ORAL
  Filled 2017-03-09 (×2): qty 1

## 2017-03-09 MED ORDER — SODIUM CHLORIDE 0.9% FLUSH: 3.0000 mL | INTRAVENOUS | Status: DC | PRN

## 2017-03-09 MED ORDER — TRAMADOL HCL 50 MG PO TABS
50.0000 mg | ORAL_TABLET | Freq: Once | ORAL | Status: AC
  Administered 2017-03-09: 50 mg via ORAL
  Filled 2017-03-09: qty 1

## 2017-03-09 MED ORDER — VANCOMYCIN HCL 1000 MG IV SOLR
INTRAVENOUS | Status: AC | PRN
  Administered 2017-03-09: 1000 mg via INTRAVENOUS

## 2017-03-09 MED ORDER — ONDANSETRON HCL 4 MG/2ML IJ SOLN: 4.0000 mg | Freq: Four times a day (QID) | INTRAMUSCULAR | Status: DC | PRN

## 2017-03-09 MED ORDER — ACETAMINOPHEN 325 MG PO TABS
325.0000 mg | ORAL_TABLET | ORAL | Status: DC | PRN
  Administered 2017-03-09 – 2017-03-10 (×2): 650 mg via ORAL
  Filled 2017-03-09 (×2): qty 2

## 2017-03-09 MED ORDER — SODIUM CHLORIDE 0.9 % IR SOLN
Status: AC
  Filled 2017-03-09: qty 2

## 2017-03-09 MED ORDER — VANCOMYCIN HCL IN DEXTROSE 1-5 GM/200ML-% IV SOLN
1000.0000 mg | Freq: Two times a day (BID) | INTRAVENOUS | Status: AC
  Administered 2017-03-10: 1000 mg via INTRAVENOUS
  Filled 2017-03-09: qty 200

## 2017-03-09 MED ORDER — SODIUM CHLORIDE 0.9% FLUSH
3.0000 mL | Freq: Two times a day (BID) | INTRAVENOUS | Status: DC
  Administered 2017-03-09: 3 mL via INTRAVENOUS

## 2017-03-09 MED ORDER — SODIUM CHLORIDE 0.9 % IV SOLN
INTRAVENOUS | Status: DC
  Administered 2017-03-09: 12:00:00 via INTRAVENOUS

## 2017-03-09 MED ORDER — HYDROCHLOROTHIAZIDE 25 MG PO TABS
12.5000 mg | ORAL_TABLET | ORAL | Status: DC
  Administered 2017-03-09: 12.5 mg via ORAL
  Filled 2017-03-09 (×2): qty 1

## 2017-03-09 MED ORDER — SODIUM CHLORIDE 0.9 % IV SOLN: 250.0000 mL | INTRAVENOUS | Status: DC | PRN

## 2017-03-09 MED ORDER — FENTANYL CITRATE (PF) 100 MCG/2ML IJ SOLN
INTRAMUSCULAR | Status: AC
  Filled 2017-03-09: qty 2

## 2017-03-09 MED ORDER — MIDAZOLAM HCL 5 MG/5ML IJ SOLN
INTRAMUSCULAR | Status: DC | PRN
  Administered 2017-03-09 (×3): 1 mg via INTRAVENOUS

## 2017-03-09 MED ORDER — CHLORHEXIDINE GLUCONATE 4 % EX LIQD: 60.0000 mL | Freq: Once | CUTANEOUS | Status: AC

## 2017-03-09 MED ORDER — LIDOCAINE HCL (PF) 1 % IJ SOLN
INTRAMUSCULAR | Status: AC
  Filled 2017-03-09: qty 60

## 2017-03-09 MED ORDER — VANCOMYCIN HCL IN DEXTROSE 1-5 GM/200ML-% IV SOLN
INTRAVENOUS | Status: AC
  Filled 2017-03-09: qty 200

## 2017-03-09 MED ORDER — HEPARIN (PORCINE) IN NACL 2-0.9 UNIT/ML-% IJ SOLN
INTRAMUSCULAR | Status: AC | PRN
  Administered 2017-03-09: 500 mL

## 2017-03-09 MED ORDER — OXCARBAZEPINE 150 MG PO TABS
150.0000 mg | ORAL_TABLET | Freq: Two times a day (BID) | ORAL | Status: DC
  Administered 2017-03-09: 150 mg via ORAL
  Filled 2017-03-09 (×4): qty 1

## 2017-03-09 SURGICAL SUPPLY — 10 items
CABLE SURGICAL S-101-97-12 (CABLE) ×1 IMPLANT
CATH RIGHTSITE C315HIS02 (CATHETERS) ×1 IMPLANT
IPG PACE AZUR XT DR MRI W1DR01 (Pacemaker) IMPLANT
LEAD CAPSURE NOVUS 5076-52CM (Lead) ×1 IMPLANT
LEAD CAPSURE NOVUS 5076-58CM (Lead) ×1 IMPLANT
PACE AZURE XT DR MRI W1DR01 (Pacemaker) ×2 IMPLANT
PAD DEFIB LIFELINK (PAD) ×1 IMPLANT
SHEATH CLASSIC 7F (SHEATH) ×2 IMPLANT
TRAY PACEMAKER INSERTION (PACKS) ×1 IMPLANT
WIRE HI TORQ VERSACORE-J 145CM (WIRE) ×1 IMPLANT

## 2017-03-09 NOTE — H&P (View-Only) (Signed)
Electrophysiology Rounding Note  Patient Name: Sharon Gilmore Date of Encounter: 03/09/2017  PCP: Olivia CanterKelly, William, MD Electrophysiologist: Ladona Ridgelaylor (new this admission)   Subjective   The patient is stable today.  At this time, the patient denies chest pain, shortness of breath, or any new concerns.  Inpatient Medications    Scheduled Meds: . aspirin EC  81 mg Oral Daily  . gabapentin  300 mg Oral QHS  . heparin  5,000 Units Subcutaneous Q8H   Continuous Infusions: . sodium chloride     PRN Meds: sodium chloride, acetaminophen, nitroGLYCERIN, ondansetron (ZOFRAN) IV, zolpidem   Vital Signs    Vitals:   03/08/17 2029 03/08/17 2100 03/09/17 0016 03/09/17 0523  BP:   139/73 (!) 144/52  Pulse:   (!) 51   Resp:   14   Temp: 98 F (36.7 C)  97.9 F (36.6 C) 98 F (36.7 C)  TempSrc: Oral  Oral Oral  SpO2:   97%   Weight:  187 lb (84.8 kg)    Height:  5\' 4"  (1.626 m)     No intake or output data in the 24 hours ending 03/09/17 0925 Filed Weights   03/08/17 2100  Weight: 187 lb (84.8 kg)    Physical Exam    GEN- The patient is elderly appearing, alert and oriented x 3 today.   Head- normocephalic, atraumatic Eyes-  Sclera clear, conjunctiva pink Ears- hearing intact Oropharynx- clear Neck- supple Lungs- Clear to ausculation bilaterally, normal work of breathing Heart- Bradycardic regular rate and rhythm  GI- soft, NT, ND, + BS Extremities- no clubbing, cyanosis, or edema Skin- no rash or lesion Psych- euthymic mood, full affect Neuro- strength and sensation are intact  Labs    CBC  Recent Labs  03/08/17 1605 03/08/17 1632 03/09/17 0502  WBC 10.1  --  7.7  NEUTROABS  --   --  4.8  HGB 11.6* 12.2 11.2*  HCT 34.7* 36.0 33.9*  MCV 87.0  --  87.6  PLT 289  --  258   Basic Metabolic Panel  Recent Labs  03/08/17 1605 03/08/17 1632 03/09/17 0502  NA 135 136  --   K 3.7 3.8  --   CL 102 100*  --   CO2 24  --   --   GLUCOSE 107* 103*  --     BUN 11 11  --   CREATININE 0.55 0.50  --   CALCIUM 9.1  --   --   MG 2.0  --  2.0   Fasting Lipid Panel  Recent Labs  03/09/17 0502  CHOL 111  HDL 47  LDLCALC 50  TRIG 69  CHOLHDL 2.4   Thyroid Function Tests  Recent Labs  03/09/17 0502  TSH 2.389    Telemetry    2:1 heart block, V rates 40's (personally reviewed)  Radiology    Dg Chest Portable 1 View  Result Date: 03/08/2017 CLINICAL DATA:  Shortness of breath. EXAM: PORTABLE CHEST 1 VIEW COMPARISON:  MRI of the thoracic spine dated 02/02/2017 FINDINGS: The heart size and pulmonary vascularity are normal and the lungs are clear. Slight elevation of the left hemidiaphragm is felt to be unchanged since 02/02/2017. No effusion. No acute bone abnormality. IMPRESSION: No significant abnormalities. Electronically Signed   By: Francene BoyersJames  Maxwell M.D.   On: 03/08/2017 17:01     Patient Profile     Sharon Gilmore is a 83 y.o. female with a past medical history significant for hypertension  and spinal stenosis.  She was admitted for 2:1 heart block associated with weakness and fatigue for the last 2 days.   Assessment & Plan    1.  2:1 heart block No reversible causes noted Normal EF within the last year Plan for pacemaker implant today. With narrow QRS, and need for V pacing, will plan His Bundle lead. Risks, benefits reviewed with patient yesterday by Dr Ladona Ridgel who wishes to proceed  2.  Spinal stenosis Pending spinal fusion surgery She should be able to proceed in the next 7-10 days after pacemaker pocket healed  3.  HTN Stable No change required today   Signed, Gypsy Balsam, NP  03/09/2017, 9:25 AM    I have seen, examined the patient, and reviewed the above assessment and plan.  On exam, brady regular rhythm.  Changes to above are made where necessary.  The patient has symptomatic bradycardia secondary to 2:1 AV block.  I would therefore recommend pacemaker implantation at this time.  Risks, benefits,  alternatives to pacemaker implantation were discussed in detail with the patient today. The patient understands that the risks include but are not limited to bleeding, infection, pneumothorax, perforation, tamponade, vascular damage, renal failure, MI, stroke, death,  and lead dislodgement and wishes to proceed. We will therefore schedule the procedure at the next available time.  Will reassess SOB afterwards to determine if further testing is necessary.  I am optimistic that she will clinically improve.   Co Sign: Hillis Range, MD 03/09/2017 12:40 PM

## 2017-03-09 NOTE — Interval H&P Note (Signed)
History and Physical Interval Note:  03/09/2017 12:41 PM  [name] Cwynar  has presented today for surgery, with the diagnosis of HB  The various methods of treatment have been discussed with the patient and family. After consideration of risks, benefits and other options for treatment, the patient has consented to  Procedure(s): Pacemaker Implant (N/A) as a surgical intervention .  The patient's history has been reviewed, patient examined, no change in status, stable for surgery.  I have reviewed the patient's chart and labs.  Questions were answered to the patient's satisfaction.     Hillis RangeJames Tsion Inghram

## 2017-03-09 NOTE — Progress Notes (Signed)
Dr. Johney FrameAllred notified of pt elevated SBP in 180s.  Received order to administer pt's daily BP meds that were held prior to pacemaker placement.  MD not overly concerned of SBPs in 180s throughout the night, expects to see it trend down s/p pacer placement.

## 2017-03-09 NOTE — Progress Notes (Signed)
Holding morning BP meds as per A. Glory BuffSeiler, NP verbal orders.  She is aware of elevated BP and states that BP is expected to improve s/p pacemaker placement today.

## 2017-03-09 NOTE — Progress Notes (Signed)
Pt transfer med rec needs reviewing, notified A. Glory BuffSeiler, NP.

## 2017-03-09 NOTE — Progress Notes (Signed)
Electrophysiology Rounding Note  Patient Name: Sharon Gilmore Date of Encounter: 03/09/2017  PCP: Olivia CanterKelly, William, MD Electrophysiologist: Ladona Ridgelaylor (new this admission)   Subjective   The patient is stable today.  At this time, the patient denies chest pain, shortness of breath, or any new concerns.  Inpatient Medications    Scheduled Meds: . aspirin EC  81 mg Oral Daily  . gabapentin  300 mg Oral QHS  . heparin  5,000 Units Subcutaneous Q8H   Continuous Infusions: . sodium chloride     PRN Meds: sodium chloride, acetaminophen, nitroGLYCERIN, ondansetron (ZOFRAN) IV, zolpidem   Vital Signs    Vitals:   03/08/17 2029 03/08/17 2100 03/09/17 0016 03/09/17 0523  BP:   139/73 (!) 144/52  Pulse:   (!) 51   Resp:   14   Temp: 98 F (36.7 C)  97.9 F (36.6 C) 98 F (36.7 C)  TempSrc: Oral  Oral Oral  SpO2:   97%   Weight:  187 lb (84.8 kg)    Height:  5\' 4"  (1.626 m)     No intake or output data in the 24 hours ending 03/09/17 0925 Filed Weights   03/08/17 2100  Weight: 187 lb (84.8 kg)    Physical Exam    GEN- The patient is elderly appearing, alert and oriented x 3 today.   Head- normocephalic, atraumatic Eyes-  Sclera clear, conjunctiva pink Ears- hearing intact Oropharynx- clear Neck- supple Lungs- Clear to ausculation bilaterally, normal work of breathing Heart- Bradycardic regular rate and rhythm  GI- soft, NT, ND, + BS Extremities- no clubbing, cyanosis, or edema Skin- no rash or lesion Psych- euthymic mood, full affect Neuro- strength and sensation are intact  Labs    CBC  Recent Labs  03/08/17 1605 03/08/17 1632 03/09/17 0502  WBC 10.1  --  7.7  NEUTROABS  --   --  4.8  HGB 11.6* 12.2 11.2*  HCT 34.7* 36.0 33.9*  MCV 87.0  --  87.6  PLT 289  --  258   Basic Metabolic Panel  Recent Labs  03/08/17 1605 03/08/17 1632 03/09/17 0502  NA 135 136  --   K 3.7 3.8  --   CL 102 100*  --   CO2 24  --   --   GLUCOSE 107* 103*  --     BUN 11 11  --   CREATININE 0.55 0.50  --   CALCIUM 9.1  --   --   MG 2.0  --  2.0   Fasting Lipid Panel  Recent Labs  03/09/17 0502  CHOL 111  HDL 47  LDLCALC 50  TRIG 69  CHOLHDL 2.4   Thyroid Function Tests  Recent Labs  03/09/17 0502  TSH 2.389    Telemetry    2:1 heart block, V rates 40's (personally reviewed)  Radiology    Dg Chest Portable 1 View  Result Date: 03/08/2017 CLINICAL DATA:  Shortness of breath. EXAM: PORTABLE CHEST 1 VIEW COMPARISON:  MRI of the thoracic spine dated 02/02/2017 FINDINGS: The heart size and pulmonary vascularity are normal and the lungs are clear. Slight elevation of the left hemidiaphragm is felt to be unchanged since 02/02/2017. No effusion. No acute bone abnormality. IMPRESSION: No significant abnormalities. Electronically Signed   By: Francene BoyersJames  Maxwell M.D.   On: 03/08/2017 17:01     Patient Profile     Sharon Gilmore is a 83 y.o. female with a past medical history significant for hypertension  and spinal stenosis.  She was admitted for 2:1 heart block associated with weakness and fatigue for the last 2 days.   Assessment & Plan    1.  2:1 heart block No reversible causes noted Normal EF within the last year Plan for pacemaker implant today. With narrow QRS, and need for V pacing, will plan His Bundle lead. Risks, benefits reviewed with patient yesterday by Dr Ladona Ridgel who wishes to proceed  2.  Spinal stenosis Pending spinal fusion surgery She should be able to proceed in the next 7-10 days after pacemaker pocket healed  3.  HTN Stable No change required today   Signed, Gypsy Balsam, NP  03/09/2017, 9:25 AM    I have seen, examined the patient, and reviewed the above assessment and plan.  On exam, brady regular rhythm.  Changes to above are made where necessary.  The patient has symptomatic bradycardia secondary to 2:1 AV block.  I would therefore recommend pacemaker implantation at this time.  Risks, benefits,  alternatives to pacemaker implantation were discussed in detail with the patient today. The patient understands that the risks include but are not limited to bleeding, infection, pneumothorax, perforation, tamponade, vascular damage, renal failure, MI, stroke, death,  and lead dislodgement and wishes to proceed. We will therefore schedule the procedure at the next available time.  Will reassess SOB afterwards to determine if further testing is necessary.  I am optimistic that she will clinically improve.   Co Sign: Hillis Range, MD 03/09/2017 12:40 PM

## 2017-03-09 NOTE — Progress Notes (Signed)
VASCULAR LAB PRELIMINARY  ARTERIAL  ABI completed:    RIGHT    LEFT    PRESSURE WAVEFORM  PRESSURE WAVEFORM  BRACHIAL 156 Triphasic BRACHIAL 171 Triphasic  DP 210 Triphasic DP 145 Biphasic  PT 192 Triphasic PT 136 Biphasic  GREAT TOE 139 NA GREAT TOE 105 NA    RIGHT LEFT  ABI / TBI 1.23 / 0.81 0.85 / 0.61   Right ABIs indicate normal arterial flow at rest. TBIs are normal . Left ABI indicate a mild reduction in arterial flow . TBIs are abnormal.  Sharon Gilmore, RVS 03/09/2017, 2:28 PM

## 2017-03-10 ENCOUNTER — Encounter (HOSPITAL_COMMUNITY): Payer: Self-pay | Admitting: Internal Medicine

## 2017-03-10 ENCOUNTER — Inpatient Hospital Stay (HOSPITAL_COMMUNITY): Payer: Medicare Other

## 2017-03-10 DIAGNOSIS — M7989 Other specified soft tissue disorders: Secondary | ICD-10-CM

## 2017-03-10 MED ORDER — TRAMADOL HCL 50 MG PO TABS
50.0000 mg | ORAL_TABLET | Freq: Four times a day (QID) | ORAL | Status: DC
  Administered 2017-03-10: 50 mg via ORAL
  Filled 2017-03-10: qty 1

## 2017-03-10 MED ORDER — PROSIGHT PO TABS: 1.0000 | ORAL_TABLET | Freq: Two times a day (BID) | ORAL | Status: DC

## 2017-03-10 MED FILL — Fentanyl Citrate Preservative Free (PF) Inj 100 MCG/2ML: INTRAMUSCULAR | Qty: 2 | Status: AC

## 2017-03-10 NOTE — Discharge Summary (Signed)
ELECTROPHYSIOLOGY PROCEDURE DISCHARGE SUMMARY    Patient ID: Sharon Gilmore,  MRN: [id number], DOB/AGE: [birth year] 83 y.o.  Admit date: 03/08/2017 Discharge date: 03/10/2017  Primary Care Physician: Olivia CanterKelly, William, MD Electrophysiologist: Enas Winchel  Primary Discharge Diagnosis:  Symptomatic 2:1 heart block status post pacemaker implantation this admission  Secondary Discharge Diagnosis:  1.  HTN 2   Spinal stenosis  Allergies  Allergen Reactions  . Celebrex [Celecoxib]     LEG ACHING   . Codeine Nausea And Vomiting  . Meloxicam     TUMMY ACHE, INEFFECTIVE  . Nortriptyline Other (See Comments)    unknown  . Percocet [Oxycodone-Acetaminophen]     unknown  . Penicillins Rash    Has patient had a PCN reaction causing immediate rash, facial/tongue/throat swelling, SOB or lightheadedness with hypotension: No Has patient had a PCN reaction causing severe rash involving mucus membranes or skin necrosis: No Has patient had a PCN reaction that required hospitalization No Has patient had a PCN reaction occurring within the last 10 years: No If all of the above answers are "NO", then may proceed with Cephalosporin use.     Procedures This Admission:  1.  Implantation of a MDT dual chamber PPM on 03/10/17 by Dr Johney FrameAllred.  The patient received a MDT model number Azure PPM with model number 5076 right atrial lead and 5076 right ventricular lead. There were no immediate post procedure complications. 2.  CXR on 03/10/17 demonstrated no pneumothorax status post device implantation.   Brief HPI: Sharon Gilmore is a 83 y.o. female was referred to electrophysiology in the outpatient setting for consideration of PPM implantation.  Past medical history includes spinal stenosis and hypertension.  She was at her neurosurgeon's office and found to be bradycardic with symptoms of fatigue and shortness of breath.  [facility]  The patient has had  symptomatic bradycardia without reversible causes identified.  Risks, benefits, and alternatives to PPM implantation were reviewed with the patient who wished to proceed.   Hospital Course:  The patient was admitted and underwent implantation of a MDT dual chamber pacemaker with details as outlined above.  She  was monitored on telemetry overnight which demonstrated sinus rhythm with V pacing.  Left chest was without hematoma or ecchymosis.  The device was interrogated and found to be functioning normally.  CXR was obtained and demonstrated no pneumothorax status post device implantation.  Wound care, arm mobility, and restrictions were reviewed with the patient.  The patient was examined and considered stable for discharge to home.   She did have LE swelling and venous dopplers were obtained prior to discharge.  Dr Johney FrameAllred personally discussed upcoming spinal surgery with Dr Venetia MaxonStern.  If device stable and wound well healed in 10 days, ok to proceed with surgery at that time.    Physical Exam: Vitals:   03/10/17 0000 03/10/17 0414 03/10/17 0415 03/10/17 0808  BP: (!) 176/82  (!) 166/76   Pulse: 82  84   Resp: (!) 25  (!) 26 20  Temp: 98.1 F (36.7 C) 98.3 F (36.8 C)  98.4 F (36.9 C)  TempSrc: Oral Oral  Oral  SpO2: 91%  93%   Weight:      Height:        GEN- The patient is well appearing, alert and oriented x 3 today.   HEENT: normocephalic, atraumatic; sclera clear, conjunctiva pink; hearing intact; oropharynx clear; neck supple Lungs- Clear to ausculation bilaterally, normal  work of breathing.  No wheezes, rales, rhonchi Heart- Regular rate and rhythm (paced) GI- soft, non-tender, non-distended, bowel sounds present Extremities- no clubbing, cyanosis, or edema MS- no significant deformity or atrophy Skin- warm and dry, no rash or lesion, left chest without hematoma/ecchymosis Psych- euthymic mood, full affect Neuro- strength and sensation are intact   Labs:   Lab Results    Component Value Date   WBC 7.7 03/09/2017   HGB 11.2 (L) 03/09/2017   HCT 33.9 (L) 03/09/2017   MCV 87.6 03/09/2017   PLT 258 03/09/2017     Recent Labs Lab 03/08/17 1605 03/08/17 1632  NA 135 136  K 3.7 3.8  CL 102 100*  CO2 24  --   BUN 11 11  CREATININE 0.55 0.50  CALCIUM 9.1  --   GLUCOSE 107* 103*    Discharge Medications:  Allergies as of 03/10/2017      Reactions   Celebrex [celecoxib]    LEG ACHING   Codeine Nausea And Vomiting   Meloxicam    TUMMY ACHE, INEFFECTIVE   Nortriptyline Other (See Comments)   unknown   Percocet [oxycodone-acetaminophen]    unknown   Penicillins Rash   Has patient had a PCN reaction causing immediate rash, facial/tongue/throat swelling, SOB or lightheadedness with hypotension: No Has patient had a PCN reaction causing severe rash involving mucus membranes or skin necrosis: No Has patient had a PCN reaction that required hospitalization No Has patient had a PCN reaction occurring within the last 10 years: No If all of the above answers are "NO", then may proceed with Cephalosporin use.      Medication List    TAKE these medications   ALEVE 220 MG tablet Generic drug:  naproxen sodium Take 220 mg by mouth 2 (two) times daily with a meal.   aspirin EC 81 MG tablet Take 81 mg by mouth at bedtime.   DRY EYES OP Place 1 drop into both eyes daily as needed (dry eye).   enalapril 20 MG tablet Commonly known as:  VASOTEC Take 20 mg by mouth daily.   gabapentin 100 MG capsule Commonly known as:  NEURONTIN Take 3 capsules (300 mg total) by mouth at bedtime.   hydrochlorothiazide 12.5 MG tablet Commonly known as:  HYDRODIURIL Take 12.5 mg by mouth every other day.   lidocaine 4 % cream Commonly known as:  LMX Apply 1 application topically as needed.   OXcarbazepine 150 MG tablet Commonly known as:  TRILEPTAL Take 1 tablet (150 mg total) by mouth 2 (two) times daily. What changed:  when to take this   PRESERVISION  AREDS 2 Caps Take 1 capsule by mouth 2 (two) times daily.   Vitamin D3 2000 units Tabs Take 1 tablet by mouth daily.       Disposition:  Discharge Instructions    Diet - low sodium heart healthy    Complete by:  As directed    Increase activity slowly    Complete by:  As directed      Follow-up Information    [facility] Follow up on 03/22/2017.   Specialty:  Cardiology Why:  at 12Noon for wound check  Contact information: [address], [address] [place] [address] [phone]       Hillis Range, MD Follow up on 06/11/2017.   Specialty:  Cardiology Why:  at [employer] information: [address] [place] [address] [phone]  Duration of Discharge Encounter: Greater than 30 minutes including physician time.  Signed, Gypsy BalsamAmber Seiler, NP 03/10/2017 9:39 AM

## 2017-03-10 NOTE — Discharge Instructions (Signed)
° ° °  Supplemental Discharge Instructions for  Pacemaker/Defibrillator Patients  Activity No heavy lifting or vigorous activity with your left/right arm for 6 to 8 weeks.  Do not raise your left/right arm above your head for one week.  Gradually raise your affected arm as drawn below.           __           03/14/17                  03/15/17                  03/16/17                  03/17/17  NO DRIVING for 1 week    ; you may begin driving on  1/6/108/1/18   .  WOUND CARE - Keep the wound area clean and dry.  Do not get this area wet for one week. No showers for one week; you may shower on  03/17/17   . - The tape/steri-strips on your wound will fall off; do not pull them off.  No bandage is needed on the site.  DO  NOT apply any creams, oils, or ointments to the wound area. - If you notice any drainage or discharge from the wound, any swelling or bruising at the site, or you develop a fever > 101? F after you are discharged home, call the office at once.  Special Instructions - You are still able to use cellular telephones; use the ear opposite the side where you have your pacemaker/defibrillator.  Avoid carrying your cellular phone near your device. - When traveling through airports, show security personnel your identification card to avoid being screened in the metal detectors.  Ask the security personnel to use the hand wand. - Avoid arc welding equipment, MRI testing (magnetic resonance imaging), TENS units (transcutaneous nerve stimulators).  Call the office for questions about other devices. - Avoid electrical appliances that are in poor condition or are not properly grounded. - Microwave ovens are safe to be near or to operate.

## 2017-03-10 NOTE — Progress Notes (Addendum)
Doing well this am Device interrogation is reviewed and normal  CXR reveals no ptx.   Reduced lead slack is noted but appears adequate.  Will follow clinically.  I discussed surgical plans with Dr Venetia MaxonStern yesterday.  I think it would be ok to proceed with surgery in 2 weeks if 10 day wound check is uneventful.  Left leg swelling is improved.  I have ordered venous doppler to exclude DVT.  ABs were ok.  Hillis RangeJames Normajean Nash MD, [facility] 03/10/2017 8:42 AM

## 2017-03-10 NOTE — Progress Notes (Signed)
As per verbal order from A. Glory BuffSeiler, NP pt is floor status. After her doppler of LLE she will be cleared for discharge if no DVTs found.

## 2017-03-10 NOTE — Progress Notes (Signed)
*  PRELIMINARY RESULTS* Vascular Ultrasound Bilateral lower extremity venous duplex has been completed.  Preliminary findings: No evidence of deep vein thrombosis or baker's cysts bilaterally.   Tempie DonningCharlotte C Kelvin Sennett 03/10/2017, 11:12 AM

## 2017-03-12 ENCOUNTER — Inpatient Hospital Stay (HOSPITAL_COMMUNITY)
Admission: EM | Admit: 2017-03-12 | Discharge: 2017-03-17 | DRG: 287 | Disposition: A | Discharge status: Skilled Nursing Facility | Payer: Medicare Other | Attending: Nephrology | Admitting: Nephrology

## 2017-03-12 ENCOUNTER — Other Ambulatory Visit: Payer: Self-pay | Admitting: Neurosurgery

## 2017-03-12 ENCOUNTER — Encounter (HOSPITAL_COMMUNITY): Payer: Self-pay | Admitting: Emergency Medicine

## 2017-03-12 ENCOUNTER — Encounter: Payer: No Typology Code available for payment source | Admitting: Neurology

## 2017-03-12 ENCOUNTER — Telehealth: Payer: Self-pay | Admitting: Internal Medicine

## 2017-03-12 DIAGNOSIS — R0600 Dyspnea, unspecified: Secondary | ICD-10-CM

## 2017-03-12 DIAGNOSIS — M4800 Spinal stenosis, site unspecified: Secondary | ICD-10-CM | POA: Diagnosis present

## 2017-03-12 DIAGNOSIS — I248 Other forms of acute ischemic heart disease: Principal | ICD-10-CM | POA: Diagnosis present

## 2017-03-12 DIAGNOSIS — Z79899 Other long term (current) drug therapy: Secondary | ICD-10-CM

## 2017-03-12 DIAGNOSIS — R7989 Other specified abnormal findings of blood chemistry: Secondary | ICD-10-CM | POA: Diagnosis present

## 2017-03-12 DIAGNOSIS — Z87891 Personal history of nicotine dependence: Secondary | ICD-10-CM

## 2017-03-12 DIAGNOSIS — D649 Anemia, unspecified: Secondary | ICD-10-CM | POA: Diagnosis present

## 2017-03-12 DIAGNOSIS — R778 Other specified abnormalities of plasma proteins: Secondary | ICD-10-CM | POA: Diagnosis present

## 2017-03-12 DIAGNOSIS — Z880 Allergy status to penicillin: Secondary | ICD-10-CM

## 2017-03-12 DIAGNOSIS — J45909 Unspecified asthma, uncomplicated: Secondary | ICD-10-CM | POA: Diagnosis present

## 2017-03-12 DIAGNOSIS — Z8709 Personal history of other diseases of the respiratory system: Secondary | ICD-10-CM

## 2017-03-12 DIAGNOSIS — Z7982 Long term (current) use of aspirin: Secondary | ICD-10-CM

## 2017-03-12 DIAGNOSIS — Z888 Allergy status to other drugs, medicaments and biological substances status: Secondary | ICD-10-CM

## 2017-03-12 DIAGNOSIS — R0602 Shortness of breath: Secondary | ICD-10-CM | POA: Diagnosis present

## 2017-03-12 DIAGNOSIS — I161 Hypertensive emergency: Secondary | ICD-10-CM | POA: Diagnosis present

## 2017-03-12 DIAGNOSIS — Z950 Presence of cardiac pacemaker: Secondary | ICD-10-CM

## 2017-03-12 DIAGNOSIS — Z885 Allergy status to narcotic agent status: Secondary | ICD-10-CM

## 2017-03-12 DIAGNOSIS — I160 Hypertensive urgency: Secondary | ICD-10-CM | POA: Diagnosis present

## 2017-03-12 DIAGNOSIS — J90 Pleural effusion, not elsewhere classified: Secondary | ICD-10-CM | POA: Diagnosis present

## 2017-03-12 MED ORDER — HYDRALAZINE HCL 20 MG/ML IJ SOLN
20.0000 mg | Freq: Once | INTRAMUSCULAR | Status: AC
  Administered 2017-03-12: 20 mg via INTRAVENOUS
  Filled 2017-03-12: qty 1

## 2017-03-12 NOTE — Telephone Encounter (Signed)
Patient calling and states that she had a pacemaker placed on 7/24 and states that she is experiencing difficulty breathing, increased SOB, and states that her HR is irregular. She states that it has been going on since 3:30 PM and states that she took a tramadol. She states that she has swelling in her left foot and leg that has been there for a couple of days. Patient states that her HR was 64 and that she did not check her BP. Patient is very worked up and audibly SOB on the phone. Patient states that she takes HCTZ 12.5 mg QD, enalapril 20 mg QD, and ASA 81 mg QD. Discussed with DOD Dr. Okey DupreEnd. Patient made aware of Dr. Serita KyleEnd's recommendations that given that it is 5:00PM on a Friday and our our office is closed and given her symptoms that she should have someone take her to the ER to be evaluated. Patient verbalized understanding.

## 2017-03-12 NOTE — ED Provider Notes (Signed)
[facility]-EMERGENCY DEPT Provider Note   CSN: [id number] Arrival date & time: 03/12/17  2319  By signing my name below, I, Ny'Kea Lewis, attest that this documentation has been prepared under the direction and in the presence of Daiki Dicostanzo, Canary Brim, *. Electronically Signed: Karren Cobble, ED Scribe. 03/13/17. 12:09 AM.  History   Chief Complaint Chief Complaint  Patient presents with  . Hypertension   The history is provided by the patient. No language interpreter was used.   HPI HPI Comments: Sharon Gilmore is a 83 y.o. female with a history of asthma, hypertension, and seizures, brought in by ambulance, who presents to the Emergency Department complaining of elevated blood pressure. She notes associated shortness of breath, mild headache, and swelling to the left lower extremity.  Per EMS, pt's caregiver notes her blood pressure was elevated at 218/100. Pt reports having a pacemaker placed on 7/24. At this time she reports she is not short of breath and her headache is very minimal. Denies visual disturbance.   Past Medical History:  Diagnosis Date  . Abnormal barium swallow 09/1999  . Asthma   . Carpal tunnel syndrome   . Erythema nodosum   . H/O Doppler ultrasound    LEFT LEG 11/1997  . Hypertension   . Impaired fasting glucose   . Psoriasis   . Rosacea   . Seizures (HCC) 1980  . Swallowing disorder   . Systolic murmur     Patient Active Problem List   Diagnosis Date Noted  . DOE (dyspnea on exertion) 03/08/2017  . AV block, 2nd degree 03/08/2017  . Essential hypertension 03/08/2017  . Family history of coronary artery disease in brother 03/08/2017  . History of asthma 03/08/2017  . AVB (atrioventricular block) 03/08/2017  . Abdominal pain 01/04/2017  . Spinal stenosis 01/04/2017    Past Surgical History:  Procedure Laterality Date  . CHOLECYSTECTOMY    . COLONOSCOPY  2004  . INCISIONAL HERNIA REPAIR    . PACEMAKER IMPLANT N/A 03/09/2017   Procedure:  Pacemaker Implant;  Surgeon: Hillis Range, MD;  Location: [facility];  Service: Cardiovascular;  Laterality: N/A;  . TONSILLECTOMY      OB History    No data available     Home Medications    Prior to Admission medications   Medication Sig Start Date End Date Taking? Authorizing Provider  Artificial Tear Ointment (DRY EYES OP) Place 1 drop into both eyes daily as needed (dry eye).   Yes [provider]  aspirin EC 81 MG tablet Take 81 mg by mouth at bedtime.    Yes [provider]  Cholecalciferol (VITAMIN D3) 2000 units TABS Take 1 tablet by mouth daily.   Yes [provider]  enalapril (VASOTEC) 20 MG tablet Take 20 mg by mouth daily.   Yes [provider]  gabapentin (NEURONTIN) 100 MG capsule Take 3 capsules (300 mg total) by mouth at bedtime. 01/04/17  Yes Levert Feinstein, MD  hydrochlorothiazide (HYDRODIURIL) 12.5 MG tablet Take 12.5 mg by mouth every other day.   Yes [provider]  lidocaine (LMX) 4 % cream Apply 1 application topically as needed. 01/20/17  Yes Levert Feinstein, MD  Multiple Vitamins-Minerals (PRESERVISION AREDS 2) CAPS Take 1 capsule by mouth 2 (two) times daily.   Yes [provider]  naproxen sodium (ALEVE) 220 MG tablet Take 220 mg by mouth 2 (two) times daily with a meal.   Yes [provider]  OXcarbazepine (TRILEPTAL) 150 MG tablet  Take 1 tablet (150 mg total) by mouth 2 (two) times daily. Patient taking differently: Take 150 mg by mouth every evening.  01/20/17  Yes Levert Feinstein, MD  traMADol (ULTRAM) 50 MG tablet Take 50 mg by mouth every 6 (six) hours as needed for moderate pain.   Yes [provider]   Family History Family History  Problem Relation Age of Onset  . CVA Mother   . Stroke Mother   . Parkinson's disease Father   . Leukemia Brother   . CVA Maternal Grandmother   . Stroke Maternal Grandmother   . Peripheral vascular disease Maternal Grandfather   . Breast cancer Paternal  Grandmother   . Diabetes Paternal Grandmother   . CVA Paternal Grandfather   . Stroke Paternal Grandfather   . Pancreatic cancer Paternal Aunt   . Stroke Paternal Aunt   . Lung cancer Paternal Uncle   . Pancreatic cancer Brother   . Coronary artery disease Brother     Social History Social History  Substance Use Topics  . Smoking status: Former Smoker    Quit date: 1982  . Smokeless tobacco: Never Used  . Alcohol use Yes     Comment: 1-2 times per week   Allergies   Celebrex [celecoxib]; Codeine; Meloxicam; Nortriptyline; Percocet [oxycodone-acetaminophen]; and Penicillins  Review of Systems Review of Systems  Eyes: Negative for visual disturbance.  Respiratory: Positive for shortness of breath.   Cardiovascular: Positive for leg swelling (left foot).  Neurological: Positive for headaches.  All other systems reviewed and are negative.  Physical Exam Updated Vital Signs BP 115/64   Pulse 98   Temp 98.1 F (36.7 C) (Oral)   Resp 15   SpO2 96%   Physical Exam  Constitutional: She is oriented to person, place, and time. She appears well-developed and well-nourished. No distress.  HENT:  Head: Normocephalic and atraumatic.  Right Ear: Hearing normal.  Left Ear: Hearing normal.  Nose: Nose normal.  Mouth/Throat: Oropharynx is clear and moist and mucous membranes are normal.  Eyes: Pupils are equal, round, and reactive to light. Conjunctivae and EOM are normal.  Neck: Normal range of motion. Neck supple.  Cardiovascular: Regular rhythm, S1 normal and S2 normal.  Exam reveals no gallop and no friction rub.   No murmur heard. Pulmonary/Chest: Effort normal and breath sounds normal. No respiratory distress. She exhibits no tenderness.  Left upper chest wall pacemaker present and tender with swelling, without sign of infection.   Abdominal: Soft. Normal appearance and bowel sounds are normal. There is no hepatosplenomegaly. There is no tenderness. There is no rebound, no  guarding, no tenderness at McBurney's point and negative Murphy's sign. No hernia.  Musculoskeletal: Normal range of motion. She exhibits edema.  Trace pidal edema to the left lower extremity more than the right.   Neurological: She is alert and oriented to person, place, and time. She has normal strength. No cranial nerve deficit or sensory deficit. Coordination normal. GCS eye subscore is 4. GCS verbal subscore is 5. GCS motor subscore is 6.  Skin: Skin is warm, dry and intact. No rash noted. No cyanosis.  Psychiatric: She has a normal mood and affect. Her speech is normal and behavior is normal. Thought content normal.  Nursing note and vitals reviewed.  ED Treatments / Results  DIAGNOSTIC STUDIES: Oxygen Saturation is 98% on RA, normal by my interpretation.   COORDINATION OF CARE: 11:29 PM-Discussed next steps with pt. Pt verbalized understanding and is agreeable with the plan.  Labs (all labs ordered are listed, but only abnormal results are displayed) Labs Reviewed  CBC WITH DIFFERENTIAL/PLATELET - Abnormal; Notable for the following:       Result Value   RBC 3.75 (*)    Hemoglobin 10.8 (*)    HCT 32.5 (*)    All other components within normal limits  COMPREHENSIVE METABOLIC PANEL - Abnormal; Notable for the following:    Sodium 133 (*)    Chloride 99 (*)    Glucose, Bld 104 (*)    BUN 5 (*)    Calcium 8.5 (*)    Total Protein 5.8 (*)    Albumin 3.0 (*)    All other components within normal limits  BRAIN NATRIURETIC PEPTIDE - Abnormal; Notable for the following:    B Natriuretic Peptide 161.4 (*)    All other components within normal limits  I-STAT TROPONIN, ED - Abnormal; Notable for the following:    Troponin i, poc 0.10 (*)    All other components within normal limits    EKG  EKG Interpretation  Date/Time:  Friday March 12 2017 23:30:58 EDT Ventricular Rate:  81 PR Interval:    QRS Duration: 154 QT Interval:  422 QTC Calculation: 490 R Axis:   -71 Text  Interpretation:  Atrial-sensed ventricular-paced rhythm No further analysis attempted due to paced rhythm Confirmed by Gilda CreasePollina, Aalayah Riles J [phone]) on 03/13/2017 12:17:19 AM       Radiology Dg Chest 2 View  Result Date: 03/13/2017 CLINICAL DATA:  Shortness of breath and chest discomfort. Hypertension. Previous smoker. EXAM: CHEST  2 VIEW COMPARISON:  03/10/2017 FINDINGS: Cardiac pacemaker. Shallow inspiration with atelectasis in the lung bases. Heart size and pulmonary vascularity are normal. No airspace disease or consolidation in the lungs. Blunting of the costophrenic angles suggesting small pleural effusions. No pneumothorax. Calcification of the aorta. Similar appearance to previous study. IMPRESSION: Shallow inspiration with linear atelectasis in the lung bases. Small bilateral pleural effusions. No consolidation or edema. Electronically Signed   By: Burman NievesWilliam  Stevens M.D.   On: 03/13/2017 00:22    Procedures Procedures (including critical care time)  Medications Ordered in ED Medications  hydrOXYzine (ATARAX/VISTARIL) tablet 10 mg (not administered)  sodium chloride 0.9 % bolus 250 mL (not administered)  hydrALAZINE (APRESOLINE) injection 20 mg (20 mg Intravenous Given 03/12/17 2343)     Initial Impression / Assessment and Plan / ED Course  I have reviewed the triage vital signs and the nursing notes.  Pertinent labs & imaging results that were available during my care of the patient were reviewed by me and considered in my medical decision making (see chart for details).     Patient presents to the emergency department for evaluation of elevated blood pressure. Patient was just hospitalized this week for heart block, had a pacemaker placed. She reports that she had shortness of breath earlier, checked her blood pressure and it was very elevated. Reviewing her records reveals she was markedly elevated during her hospital stay, went home on the same meds she had been  receiving.  Patient received IV hydralazine and blood pressure significantly improved. She became very anxious at that point, however. She had agitation and shaking of her legs. She was given a small dose of hydroxyzine and small amount of IV fluids with resolution of these symptoms. Her lab work, however, reveals mildly elevated troponin. He was normal at her previous visit. This is likely secondary to the prolonged elevated blood pressure. Will observe the patient in the hospital  overnight for repeat cardiac enzymes and monitoring blood pressure.  Final Clinical Impressions(s) / ED Diagnoses   Final diagnoses:  Hypertensive urgency    New Prescriptions New Prescriptions   No medications on file   I personally performed the services described in this documentation, which was scribed in my presence. The recorded information has been reviewed and is accurate.    Gilda CreasePollina, Jennalyn Cawley J, MD 03/13/17 [phone]

## 2017-03-12 NOTE — Telephone Encounter (Signed)
New Message     Pt c/o swelling: STAT is pt has developed SOB within 24 hours  1. How long have you been experiencing swelling?  A couple days  2. Where is the swelling located? foot  3.  Are you currently taking a "fluid pill"? Yes   4.  Are you currently SOB?  Yes   5.  Have you traveled recently? No   Pt states she just had a device put in Tuesday and she is also having an irregular heart beat

## 2017-03-12 NOTE — ED Triage Notes (Signed)
Per EMS pt from home. Pacemaker placement on Monday, discharged on Wednesday. Pt caregiver noted BP at 1700 was elevated.  EMS noted 218/100.  Upon arrival had decreased to 186/90.  Pt has no other complaints other than some left shoulder/arm pain which she attributes to her procedure.

## 2017-03-13 ENCOUNTER — Encounter (HOSPITAL_COMMUNITY): Payer: Self-pay | Admitting: Internal Medicine

## 2017-03-13 ENCOUNTER — Emergency Department (HOSPITAL_COMMUNITY): Payer: Medicare Other

## 2017-03-13 ENCOUNTER — Observation Stay (HOSPITAL_BASED_OUTPATIENT_CLINIC_OR_DEPARTMENT_OTHER): Payer: Medicare Other

## 2017-03-13 DIAGNOSIS — R0602 Shortness of breath: Secondary | ICD-10-CM | POA: Diagnosis not present

## 2017-03-13 DIAGNOSIS — R748 Abnormal levels of other serum enzymes: Secondary | ICD-10-CM | POA: Diagnosis not present

## 2017-03-13 DIAGNOSIS — R0600 Dyspnea, unspecified: Secondary | ICD-10-CM | POA: Diagnosis not present

## 2017-03-13 DIAGNOSIS — I160 Hypertensive urgency: Secondary | ICD-10-CM | POA: Diagnosis present

## 2017-03-13 DIAGNOSIS — R7989 Other specified abnormal findings of blood chemistry: Secondary | ICD-10-CM

## 2017-03-13 DIAGNOSIS — R778 Other specified abnormalities of plasma proteins: Secondary | ICD-10-CM | POA: Diagnosis present

## 2017-03-13 LAB — CBC WITH DIFFERENTIAL/PLATELET
Basophils Absolute: 0 10*3/uL (ref 0.0–0.1)
Basophils Relative: 0 %
EOS ABS: 0.4 10*3/uL (ref 0.0–0.7)
EOS PCT: 6 %
HCT: 32.5 % — ABNORMAL LOW (ref 36.0–46.0)
HEMOGLOBIN: 10.8 g/dL — AB (ref 12.0–15.0)
LYMPHS ABS: 1.8 10*3/uL (ref 0.7–4.0)
Lymphocytes Relative: 24 %
MCH: 28.8 pg (ref 26.0–34.0)
MCHC: 33.2 g/dL (ref 30.0–36.0)
MCV: 86.7 fL (ref 78.0–100.0)
MONOS PCT: 9 %
Monocytes Absolute: 0.7 10*3/uL (ref 0.1–1.0)
Neutro Abs: 4.5 10*3/uL (ref 1.7–7.7)
Neutrophils Relative %: 61 %
PLATELETS: 215 10*3/uL (ref 150–400)
RBC: 3.75 MIL/uL — ABNORMAL LOW (ref 3.87–5.11)
RDW: 15.2 % (ref 11.5–15.5)
WBC: 7.4 10*3/uL (ref 4.0–10.5)

## 2017-03-13 LAB — CBC
HEMATOCRIT: 37.9 % (ref 36.0–46.0)
Hemoglobin: 12.5 g/dL (ref 12.0–15.0)
MCH: 28.3 pg (ref 26.0–34.0)
MCHC: 33 g/dL (ref 30.0–36.0)
MCV: 85.9 fL (ref 78.0–100.0)
Platelets: 244 10*3/uL (ref 150–400)
RBC: 4.41 MIL/uL (ref 3.87–5.11)
RDW: 15 % (ref 11.5–15.5)
WBC: 10.7 10*3/uL — AB (ref 4.0–10.5)

## 2017-03-13 LAB — COMPREHENSIVE METABOLIC PANEL
ALK PHOS: 88 U/L (ref 38–126)
ALT: 36 U/L (ref 14–54)
ANION GAP: 9 (ref 5–15)
AST: 32 U/L (ref 15–41)
Albumin: 3 g/dL — ABNORMAL LOW (ref 3.5–5.0)
BUN: 5 mg/dL — ABNORMAL LOW (ref 6–20)
CO2: 25 mmol/L (ref 22–32)
CREATININE: 0.52 mg/dL (ref 0.44–1.00)
Calcium: 8.5 mg/dL — ABNORMAL LOW (ref 8.9–10.3)
Chloride: 99 mmol/L — ABNORMAL LOW (ref 101–111)
GFR calc non Af Amer: >60 mL/min (ref >60)
Glucose, Bld: 104 mg/dL — ABNORMAL HIGH (ref 65–99)
Potassium: 3.7 mmol/L (ref 3.5–5.1)
SODIUM: 133 mmol/L — AB (ref 135–145)
TOTAL PROTEIN: 5.8 g/dL — AB (ref 6.5–8.1)
Total Bilirubin: 0.4 mg/dL (ref 0.3–1.2)

## 2017-03-13 LAB — HEPATIC FUNCTION PANEL
ALBUMIN: 3.1 g/dL — AB (ref 3.5–5.0)
ALT: 39 U/L (ref 14–54)
AST: 40 U/L (ref 15–41)
Alkaline Phosphatase: 90 U/L (ref 38–126)
Bilirubin, Direct: 0.1 mg/dL (ref 0.1–0.5)
Indirect Bilirubin: 0.4 mg/dL (ref 0.3–0.9)
TOTAL PROTEIN: 6.5 g/dL (ref 6.5–8.1)
Total Bilirubin: 0.5 mg/dL (ref 0.3–1.2)

## 2017-03-13 LAB — TSH: TSH: 2.01 u[IU]/mL (ref 0.350–4.500)

## 2017-03-13 LAB — BASIC METABOLIC PANEL
ANION GAP: 7 (ref 5–15)
BUN: <5 mg/dL — ABNORMAL LOW (ref 6–20)
CO2: 25 mmol/L (ref 22–32)
Calcium: 8.7 mg/dL — ABNORMAL LOW (ref 8.9–10.3)
Chloride: 101 mmol/L (ref 101–111)
Creatinine, Ser: 0.54 mg/dL (ref 0.44–1.00)
GLUCOSE: 124 mg/dL — AB (ref 65–99)
POTASSIUM: 3.7 mmol/L (ref 3.5–5.1)
Sodium: 133 mmol/L — ABNORMAL LOW (ref 135–145)

## 2017-03-13 LAB — ECHOCARDIOGRAM COMPLETE
HEIGHTINCHES: 64 in
Weight: 3041.6 oz

## 2017-03-13 LAB — TROPONIN I
TROPONIN I: 1.57 ng/mL — AB (ref <0.03)
Troponin I: 0.11 ng/mL (ref <0.03)
Troponin I: 0.39 ng/mL (ref <0.03)
Troponin I: 0.92 ng/mL (ref <0.03)

## 2017-03-13 LAB — MAGNESIUM: Magnesium: 2 mg/dL (ref 1.7–2.4)

## 2017-03-13 LAB — I-STAT TROPONIN, ED: TROPONIN I, POC: 0.1 ng/mL — AB (ref 0.00–0.08)

## 2017-03-13 LAB — BRAIN NATRIURETIC PEPTIDE: B Natriuretic Peptide: 161.4 pg/mL — ABNORMAL HIGH (ref 0.0–100.0)

## 2017-03-13 LAB — D-DIMER, QUANTITATIVE (NOT AT ARMC): D DIMER QUANT: 1.96 ug{FEU}/mL — AB (ref 0.00–0.50)

## 2017-03-13 MED ORDER — HYDRALAZINE HCL 20 MG/ML IJ SOLN: 10.0000 mg | INTRAMUSCULAR | Status: DC | PRN

## 2017-03-13 MED ORDER — HYDRALAZINE HCL 20 MG/ML IJ SOLN
5.0000 mg | INTRAMUSCULAR | Status: DC | PRN
Start: 2017-03-13 — End: 2017-03-15
  Administered 2017-03-13: 5 mg via INTRAVENOUS
  Filled 2017-03-13: qty 1

## 2017-03-13 MED ORDER — ASPIRIN EC 81 MG PO TBEC
81.0000 mg | DELAYED_RELEASE_TABLET | Freq: Every day | ORAL | Status: DC
  Administered 2017-03-13 – 2017-03-16 (×4): 81 mg via ORAL
  Filled 2017-03-13 (×4): qty 1

## 2017-03-13 MED ORDER — HEPARIN (PORCINE) IN NACL 100-0.45 UNIT/ML-% IJ SOLN
1200.0000 [IU]/h | INTRAMUSCULAR | Status: DC
  Administered 2017-03-13: 750 [IU]/h via INTRAVENOUS
  Administered 2017-03-15: 1100 [IU]/h via INTRAVENOUS
  Filled 2017-03-13 (×2): qty 250

## 2017-03-13 MED ORDER — ACETAMINOPHEN 325 MG PO TABS
650.0000 mg | ORAL_TABLET | Freq: Four times a day (QID) | ORAL | Status: DC | PRN
  Administered 2017-03-13 (×2): 650 mg via ORAL
  Filled 2017-03-13 (×2): qty 2

## 2017-03-13 MED ORDER — METOPROLOL TARTRATE 25 MG PO TABS
25.0000 mg | ORAL_TABLET | Freq: Four times a day (QID) | ORAL | Status: DC
  Administered 2017-03-14 – 2017-03-17 (×12): 25 mg via ORAL
  Filled 2017-03-13 (×12): qty 1

## 2017-03-13 MED ORDER — GABAPENTIN 300 MG PO CAPS
300.0000 mg | ORAL_CAPSULE | Freq: Every day | ORAL | Status: DC
  Administered 2017-03-13 – 2017-03-16 (×4): 300 mg via ORAL
  Filled 2017-03-13 (×4): qty 1

## 2017-03-13 MED ORDER — HYDROCHLOROTHIAZIDE 25 MG PO TABS
12.5000 mg | ORAL_TABLET | ORAL | Status: DC
  Administered 2017-03-14: 12.5 mg via ORAL
  Filled 2017-03-13: qty 1

## 2017-03-13 MED ORDER — PROSIGHT PO TABS
1.0000 | ORAL_TABLET | Freq: Two times a day (BID) | ORAL | Status: DC
  Administered 2017-03-13 – 2017-03-17 (×9): 1 via ORAL
  Filled 2017-03-13 (×9): qty 1

## 2017-03-13 MED ORDER — ONDANSETRON HCL 4 MG PO TABS: 4.0000 mg | ORAL_TABLET | Freq: Four times a day (QID) | ORAL | Status: DC | PRN

## 2017-03-13 MED ORDER — METOPROLOL TARTRATE 25 MG PO TABS
25.0000 mg | ORAL_TABLET | Freq: Two times a day (BID) | ORAL | Status: DC
  Administered 2017-03-13: 25 mg via ORAL
  Filled 2017-03-13: qty 1

## 2017-03-13 MED ORDER — OXCARBAZEPINE 150 MG PO TABS
150.0000 mg | ORAL_TABLET | Freq: Every evening | ORAL | Status: DC
  Administered 2017-03-13 – 2017-03-14 (×2): 150 mg via ORAL
  Filled 2017-03-13 (×5): qty 1

## 2017-03-13 MED ORDER — ENOXAPARIN SODIUM 40 MG/0.4ML ~~LOC~~ SOLN
40.0000 mg | SUBCUTANEOUS | Status: DC
  Administered 2017-03-13: 40 mg via SUBCUTANEOUS
  Filled 2017-03-13: qty 0.4

## 2017-03-13 MED ORDER — ACETAMINOPHEN 650 MG RE SUPP: 650.0000 mg | Freq: Four times a day (QID) | RECTAL | Status: DC | PRN

## 2017-03-13 MED ORDER — TRAMADOL HCL 50 MG PO TABS
50.0000 mg | ORAL_TABLET | Freq: Four times a day (QID) | ORAL | Status: DC | PRN
  Administered 2017-03-13 – 2017-03-16 (×4): 50 mg via ORAL
  Filled 2017-03-13 (×4): qty 1

## 2017-03-13 MED ORDER — ENALAPRIL MALEATE 5 MG PO TABS
20.0000 mg | ORAL_TABLET | Freq: Every day | ORAL | Status: DC
  Administered 2017-03-13 – 2017-03-17 (×5): 20 mg via ORAL
  Filled 2017-03-13 (×3): qty 1
  Filled 2017-03-13: qty 4
  Filled 2017-03-13: qty 1

## 2017-03-13 MED ORDER — FUROSEMIDE 10 MG/ML IJ SOLN
20.0000 mg | Freq: Once | INTRAMUSCULAR | Status: AC
  Administered 2017-03-13: 20 mg via INTRAVENOUS
  Filled 2017-03-13: qty 2

## 2017-03-13 MED ORDER — ONDANSETRON HCL 4 MG/2ML IJ SOLN: 4.0000 mg | Freq: Four times a day (QID) | INTRAMUSCULAR | Status: DC | PRN

## 2017-03-13 MED ORDER — HYDROXYZINE HCL 10 MG PO TABS
10.0000 mg | ORAL_TABLET | Freq: Once | ORAL | Status: DC
  Filled 2017-03-13: qty 1

## 2017-03-13 MED ORDER — SODIUM CHLORIDE 0.9 % IV BOLUS (SEPSIS)
250.0000 mL | Freq: Once | INTRAVENOUS | Status: AC
  Administered 2017-03-13: 250 mL via INTRAVENOUS

## 2017-03-13 MED ORDER — FUROSEMIDE 10 MG/ML IJ SOLN
40.0000 mg | Freq: Once | INTRAMUSCULAR | Status: AC
  Administered 2017-03-13: 40 mg via INTRAVENOUS

## 2017-03-13 NOTE — ED Notes (Signed)
Nurse collected labs. 

## 2017-03-13 NOTE — H&P (Signed)
History and Physical    Sharon Gilmore:[id number] DOB: 1934/02/06 DOA: 03/12/2017  PCP: Olivia Canter, MD  Patient coming from: Nursing home.  Chief Complaint: Shortness of breath. Elevated blood pressure.  HPI: Sharon Gilmore is a 83 y.o. female with history of hypertension who has had recent pacemaker placement for symptomatic bradycardia 3 days ago was brought to the ER patient had markedly elevated blood pressure. As per the patient patient's blood pressure was more than 200 systolic at the nursing facility. Patient states since last evening 3 PM patient has been getting progressively short of breath on exertion. Denies any chest pain fever chills or productive cough. Patient states her heart rate also has been increasing from baseline. When patient checked her blood pressure goes more than 200 and patient was brought to the ER.   ED Course: In the ER patient blood pressure was found to be 180 systolic and was given IV hydralazine 20 mg following which blood pressure improved. Troponin is mildly elevated at 0.1. EKG was showing paced rhythm and chest x-ray was showing bilateral pleural effusion. Patient is being admitted for further observation.  Review of Systems: As per HPI, rest all negative.   Past Medical History:  Diagnosis Date  . Abnormal barium swallow 09/1999  . Asthma   . Carpal tunnel syndrome   . Erythema nodosum   . H/O Doppler ultrasound    LEFT LEG 11/1997  . Hypertension   . Impaired fasting glucose   . Psoriasis   . Rosacea   . Seizures (HCC) 1980  . Swallowing disorder   . Systolic murmur     Past Surgical History:  Procedure Laterality Date  . CHOLECYSTECTOMY    . COLONOSCOPY  2004  . INCISIONAL HERNIA REPAIR    . PACEMAKER IMPLANT N/A 03/09/2017   Procedure: Pacemaker Implant;  Surgeon: Hillis Range, MD;  Location: [facility];  Service: Cardiovascular;  Laterality: N/A;  . TONSILLECTOMY       reports that she quit smoking  about 36 years ago. She has never used smokeless tobacco. She reports that she drinks alcohol. She reports that she does not use drugs.  Allergies  Allergen Reactions  . Celebrex [Celecoxib]     LEG ACHING   . Codeine Nausea And Vomiting  . Meloxicam     TUMMY ACHE, INEFFECTIVE  . Nortriptyline Other (See Comments)    unknown  . Percocet [Oxycodone-Acetaminophen]     unknown  . Penicillins Rash    Has patient had a PCN reaction causing immediate rash, facial/tongue/throat swelling, SOB or lightheadedness with hypotension: No Has patient had a PCN reaction causing severe rash involving mucus membranes or skin necrosis: No Has patient had a PCN reaction that required hospitalization No Has patient had a PCN reaction occurring within the last 10 years: No If all of the above answers are "NO", then may proceed with Cephalosporin use.    Family History  Problem Relation Age of Onset  . CVA Mother   . Stroke Mother   . Parkinson's disease Father   . Leukemia Brother   . CVA Maternal Grandmother   . Stroke Maternal Grandmother   . Peripheral vascular disease Maternal Grandfather   . Breast cancer Paternal Grandmother   . Diabetes Paternal Grandmother   . CVA Paternal Grandfather   . Stroke Paternal Grandfather   . Pancreatic cancer Paternal Aunt   . Stroke Paternal Aunt   . Lung cancer Paternal Uncle   .  Pancreatic cancer Brother   . Coronary artery disease Brother     Prior to Admission medications   Medication Sig Start Date End Date Taking? Authorizing Provider  Artificial Tear Ointment (DRY EYES OP) Place 1 drop into both eyes daily as needed (dry eye).   Yes [provider]  aspirin EC 81 MG tablet Take 81 mg by mouth at bedtime.    Yes [provider]  Cholecalciferol (VITAMIN D3) 2000 units TABS Take 1 tablet by mouth daily.   Yes [provider]  enalapril (VASOTEC) 20 MG tablet Take 20 mg by mouth daily.   Yes [provider]    gabapentin (NEURONTIN) 100 MG capsule Take 3 capsules (300 mg total) by mouth at bedtime. 01/04/17  Yes Levert Feinstein, MD  hydrochlorothiazide (HYDRODIURIL) 12.5 MG tablet Take 12.5 mg by mouth every other day.   Yes [provider]  lidocaine (LMX) 4 % cream Apply 1 application topically as needed. 01/20/17  Yes Levert Feinstein, MD  Multiple Vitamins-Minerals (PRESERVISION AREDS 2) CAPS Take 1 capsule by mouth 2 (two) times daily.   Yes [provider]  naproxen sodium (ALEVE) 220 MG tablet Take 220 mg by mouth 2 (two) times daily with a meal.   Yes [provider]  OXcarbazepine (TRILEPTAL) 150 MG tablet Take 1 tablet (150 mg total) by mouth 2 (two) times daily. Patient taking differently: Take 150 mg by mouth every evening.  01/20/17  Yes Levert Feinstein, MD  traMADol (ULTRAM) 50 MG tablet Take 50 mg by mouth every 6 (six) hours as needed for moderate pain.   Yes [provider]    Physical Exam: Vitals:   03/13/17 0330 03/13/17 0400 03/13/17 0430 03/13/17 0500  BP: (!) 154/84 (!) 142/89 (!) 148/63 (!) 162/79  Pulse: 96 89 93 94  Resp: (!) 25 20 (!) 25 (!) 25  Temp:      TempSrc:      SpO2: 98% 95% 94% 97%      Constitutional: Moderately built and nourished. Vitals:   03/13/17 0330 03/13/17 0400 03/13/17 0430 03/13/17 0500  BP: (!) 154/84 (!) 142/89 (!) 148/63 (!) 162/79  Pulse: 96 89 93 94  Resp: (!) 25 20 (!) 25 (!) 25  Temp:      TempSrc:      SpO2: 98% 95% 94% 97%   Eyes: Anicteric no pallor. ENMT: No discharge from the ears eyes nose and mouth. Neck: No JVD appreciated no mass felt. Respiratory: No rhonchi or crepitations. Cardiovascular: S1-S2 heard no murmurs appreciated. Abdomen: Soft nontender bowel sounds present. Musculoskeletal: No edema. No joint effusion. Skin: No rash or skin appears warm. Neurologic: Alert awake oriented to time place and person. Moves all extremities. Psychiatric: Appears normal. Normal affect.   Labs on Admission:  I have personally reviewed following labs and imaging studies  CBC:  Recent Labs Lab 03/08/17 1605 03/08/17 1632 03/09/17 0502 03/12/17 2331  WBC 10.1  --  7.7 7.4  NEUTROABS  --   --  4.8 4.5  HGB 11.6* 12.2 11.2* 10.8*  HCT 34.7* 36.0 33.9* 32.5*  MCV 87.0  --  87.6 86.7  PLT 289  --  258 215   Basic Metabolic Panel:  Recent Labs Lab 03/08/17 1605 03/08/17 1632 03/09/17 0502 03/12/17 2331  NA 135 136  --  133*  K 3.7 3.8  --  3.7  CL 102 100*  --  99*  CO2 24  --   --  25  GLUCOSE 107* 103*  --  104*  BUN 11 11  --  5*  CREATININE 0.55 0.50  --  0.52  CALCIUM 9.1  --   --  8.5*  MG 2.0  --  2.0  --    GFR: Estimated Creatinine Clearance: 59.1 mL/min (by C-G formula based on SCr of 0.52 mg/dL). Liver Function Tests:  Recent Labs Lab 03/12/17 2331  AST 32  ALT 36  ALKPHOS 88  BILITOT 0.4  PROT 5.8*  ALBUMIN 3.0*   No results for input(s): LIPASE, AMYLASE in the last 168 hours. No results for input(s): AMMONIA in the last 168 hours. Coagulation Profile:  Recent Labs Lab 03/09/17 0502  INR 1.27   Cardiac Enzymes:  Recent Labs Lab 03/13/17 0230  TROPONINI 0.11*   BNP (last 3 results) No results for input(s): PROBNP in the last 8760 hours. HbA1C: No results for input(s): HGBA1C in the last 72 hours. CBG: No results for input(s): GLUCAP in the last 168 hours. Lipid Profile: No results for input(s): CHOL, HDL, LDLCALC, TRIG, CHOLHDL, LDLDIRECT in the last 72 hours. Thyroid Function Tests: No results for input(s): TSH, T4TOTAL, FREET4, T3FREE, THYROIDAB in the last 72 hours. Anemia Panel: No results for input(s): VITAMINB12, FOLATE, FERRITIN, TIBC, IRON, RETICCTPCT in the last 72 hours. Urine analysis: No results found for: COLORURINE, APPEARANCEUR, LABSPEC, PHURINE, GLUCOSEU, HGBUR, BILIRUBINUR, KETONESUR, PROTEINUR, UROBILINOGEN, NITRITE, LEUKOCYTESUR Sepsis Labs: @LABRCNTIP (procalcitonin:4,lacticidven:4) ) Recent Results (from the past 240  hour(s))  MRSA PCR Screening     Status: None   Collection Time: 03/08/17  9:08 PM  Result Value Ref Range Status   MRSA by PCR NEGATIVE NEGATIVE Final    Comment:        The GeneXpert MRSA Assay (FDA approved for NASAL specimens only), is one component of a comprehensive MRSA colonization surveillance program. It is not intended to diagnose MRSA infection nor to guide or monitor treatment for MRSA infections.      Radiological Exams on Admission: Dg Chest 2 View  Result Date: 03/13/2017 CLINICAL DATA:  Shortness of breath and chest discomfort. Hypertension. Previous smoker. EXAM: CHEST  2 VIEW COMPARISON:  03/10/2017 FINDINGS: Cardiac pacemaker. Shallow inspiration with atelectasis in the lung bases. Heart size and pulmonary vascularity are normal. No airspace disease or consolidation in the lungs. Blunting of the costophrenic angles suggesting small pleural effusions. No pneumothorax. Calcification of the aorta. Similar appearance to previous study. IMPRESSION: Shallow inspiration with linear atelectasis in the lung bases. Small bilateral pleural effusions. No consolidation or edema. Electronically Signed   By: Burman NievesWilliam  Stevens M.D.   On: 03/13/2017 00:22    EKG: Independently reviewed. Paced rhythm.  Assessment/Plan Principal Problem:   Hypertensive urgency Active Problems:   Spinal stenosis   History of asthma   SOB (shortness of breath)   Elevated troponin    1. Hypertensive urgency - patient is on enalapril and hydrochlorothiazide which will be continued for now. I have ordered 1 dose of Lasix 20 mg IV since patient was having shortness of breath and chest x-ray was showing bilateral pleural effusion. Will check 2-D echo. Based on patient's blood pressure trends we will see if patient's antihypertensive doses may need to be increased or added. 2. Elevated troponin - denies chest pain but has been having shortness of breath. Cycle cardiac markers check 2-D echo. Patient is  on aspirin. 3. Spinal stenosis - patient is on pain relief medications and on Trileptal and Neurontin. Patient is scheduled to have surgery in  August 10 2 weeks from now. 4. Normocytic normochromic anemia - follow CBC. Further workup as outpatient if that is not significant fall in hemoglobin. 5. Recent pacemaker placement 3 days ago for symptomatic bradycardia.  I have reviewed patient's old charts and labs.   DVT prophylaxis: Lovenox. Code Status: Full code.  Family Communication: Discussed with patient.  Disposition Plan: Back to nursing facility.  Consults called: None.  Admission status: Observation.    Eduard ClosKAKRAKANDY,Jameela Michna N. MD [facility] Pager [phone].  If 7PM-7AM, please contact night-coverage [email or web address] Password [email or web address]  03/13/2017, 5:33 AM

## 2017-03-13 NOTE — Progress Notes (Signed)
Troponin 1.57    Pt remains hypertensive, mildly tachycardic Spoke to nursing  Patient says sore at pacer site  WIll add heparin no bolus  (spoke to pharmacy; pt did get lovenox earlier) Increase b blocker Check d dimer    Dietrich PatesPaula Ross

## 2017-03-13 NOTE — Progress Notes (Signed)
ANTICOAGULATION CONSULT NOTE - Initial Consult  Pharmacy Consult for heparin Indication: chest pain/ACS  Allergies  Allergen Reactions  . Celebrex [Celecoxib]     LEG ACHING   . Codeine Nausea And Vomiting  . Meloxicam     TUMMY ACHE, INEFFECTIVE  . Nortriptyline Other (See Comments)    unknown  . Percocet [Oxycodone-Acetaminophen]     unknown  . Penicillins Rash    Has patient had a PCN reaction causing immediate rash, facial/tongue/throat swelling, SOB or lightheadedness with hypotension: No Has patient had a PCN reaction causing severe rash involving mucus membranes or skin necrosis: No Has patient had a PCN reaction that required hospitalization No Has patient had a PCN reaction occurring within the last 10 years: No If all of the above answers are "NO", then may proceed with Cephalosporin use.    Patient Measurements: Height: 5\' 4"  (162.6 cm) Weight: 190 lb 1.6 oz (86.2 kg) IBW/kg (Calculated) : 54.7 Heparin Dosing Weight: 74kg  Vital Signs: Temp: 97.7 F (36.5 C) (07/28 2138) Temp Source: Oral (07/28 2138) BP: 160/78 (07/28 2138) Pulse Rate: 88 (07/28 2138)  Labs:  Recent Labs  03/12/17 2331  03/13/17 0539 03/13/17 0846 03/13/17 1759  HGB 10.8*  --  12.5  --   --   HCT 32.5*  --  37.9  --   --   PLT 215  --  244  --   --   CREATININE 0.52  --  0.54  --   --   TROPONINI  --   < > 0.39* 0.92* 1.57*  < > = values in this interval not displayed.  Estimated Creatinine Clearance: 59.6 mL/min (by C-G formula based on SCr of 0.54 mg/dL).   Medical History: Past Medical History:  Diagnosis Date  . Abnormal barium swallow 09/1999  . Asthma   . Carpal tunnel syndrome   . Erythema nodosum   . H/O Doppler ultrasound    LEFT LEG 11/1997  . Hypertension   . Impaired fasting glucose   . Psoriasis   . Rosacea   . Seizures (HCC) 1980  . Swallowing disorder   . Systolic murmur     Medications:  Prescriptions Prior to Admission  Medication Sig Dispense  Refill Last Dose  . Artificial Tear Ointment (DRY EYES OP) Place 1 drop into both eyes daily as needed (dry eye).   Past Week at Unknown time  . aspirin EC 81 MG tablet Take 81 mg by mouth at bedtime.    03/12/2017 at Unknown time  . Cholecalciferol (VITAMIN D3) 2000 units TABS Take 1 tablet by mouth daily.   03/12/2017 at Unknown time  . enalapril (VASOTEC) 20 MG tablet Take 20 mg by mouth daily.   03/12/2017 at Unknown time  . gabapentin (NEURONTIN) 100 MG capsule Take 3 capsules (300 mg total) by mouth at bedtime. 90 capsule 6 03/12/2017 at Unknown time  . hydrochlorothiazide (HYDRODIURIL) 12.5 MG tablet Take 12.5 mg by mouth every other day.   03/12/2017 at Unknown time  . lidocaine (LMX) 4 % cream Apply 1 application topically as needed. 45 g 6 Past Month at Unknown time  . Multiple Vitamins-Minerals (PRESERVISION AREDS 2) CAPS Take 1 capsule by mouth 2 (two) times daily.   03/12/2017 at Unknown time  . naproxen sodium (ALEVE) 220 MG tablet Take 220 mg by mouth 2 (two) times daily with a meal.   03/12/2017 at Unknown time  . OXcarbazepine (TRILEPTAL) 150 MG tablet Take 1 tablet (150 mg total)  by mouth 2 (two) times daily. (Patient taking differently: Take 150 mg by mouth every evening. ) 60 tablet 6 03/12/2017 at Unknown time  . traMADol (ULTRAM) 50 MG tablet Take 50 mg by mouth every 6 (six) hours as needed for moderate pain.   03/12/2017 at Unknown time   Scheduled:  . aspirin EC  81 mg Oral QHS  . enalapril  20 mg Oral Daily  . enoxaparin (LOVENOX) injection  40 mg Subcutaneous Q24H  . gabapentin  300 mg Oral QHS  . [START ON 03/14/2017] hydrochlorothiazide  12.5 mg Oral QODAY  . [START ON 03/14/2017] metoprolol tartrate  25 mg Oral QID  . multivitamin  1 tablet Oral BID  . OXcarbazepine  150 mg Oral QPM    Assessment: 83 yo female here with CP to start heparin for r/o ACS.  She is noted s/p pacemaker implant on 03/09/17. No heparin bolus per MD request. She has also received lovenox 40mg  at ~  5:30pm today -Hg= 12.5  Goal of Therapy:  Heparin level 0.3-0.5 units/ml Monitor platelets by anticoagulation protocol: Yes   Plan:  -No heparin bolus -Start heparin infusion at 750 units/hr (~10 units/kg/hr) -Heparin level in 8 hours and daily wth CBC daily  Harland Germanndrew Concepcion Kirkpatrick, Pharm D 03/13/2017 10:17 PM

## 2017-03-13 NOTE — Progress Notes (Addendum)
Pt's manual BP 182/90. Pt does not want to take her PRN hydralazine 10 mg again. MD Nettey made aware. Will continue to assess.  1810 - MD ordered hydralazine 5 mg. Pt willing to take. Medication given. Will reassess BP.   1845 - BP 137/56.

## 2017-03-13 NOTE — Progress Notes (Signed)
Patient seen and examined at bedside, patient admitted after midnight, please see earlier detailed admission note by Eduard ClosArshad N Kakrakandy, MD. Briefly, patient presented with dyspnea on exertion and hypertension emergency with associated elevated. No chest pain. Cardiology consulted. Echocardiogram pending.   Jacquelin Hawkingalph Alexus Galka, MD [facility] 03/13/2017, 11:22 AM Pager: [phone]

## 2017-03-13 NOTE — Consult Note (Signed)
Cardiology Consultation:   Patient ID: Sharon Gilmore; [id number]; Nov 14, 1933   Admit date: 03/12/2017 Date of Consult: 03/13/2017  Primary Care Provider: Olivia CanterKelly, William, MD Primary Cardiologist: Dr. Delton SeeNelson Primary Electrophysiologist:  Dr. Jeannetta Napaylor/Allred   Patient Profile:   Sharon Gilmore is a 83 y.o. female with a hx of HTN, 2:1 AVB s/p dual chamber Medtronic PPM, impaired fasting glucose who is being seen today for the evaluation of DOE, elevated trop, and hypertensive urgency at the request of Dr. Caleb PoppNettey.  History of Present Illness:   Sharon Gilmore is a 83 yo female with PMH of HTN, 2:1 AVB s/p dual chamber Medtronic PPM (724/2018), impaired fasting glucose. She was previously seen by Lauralyn PrimesLaura Gerhadt NP in 2017 for dyspnea on exertion. She was noted to have a cardiac murmur physical exam. She had Q waves in the lateral leads but no prior history of CAD. Outpatient myoview was low risk without any ischemia or infarction. Echocardiogram showed normal EF.   She returned to the hospital on 03/08/2017 with high degree AV block, she eventually underwent Medtronic dual-chamber pacemaker. Prior to this, she says she has been having several weeks of dyspnea on exertion. She denies any obvious chest pain. But she says she would become very short of breath with minimal exertion. She also have left lower extremity pain as well, left ABI indicate a mild reduction in arterial flow at rest, right ABI normal. Lower extremity venous Doppler was negative on 03/10/2017. Post op, she recovered well. Chest x-ray afterward was negative for pneumothorax. After discharge, she continued to have dyspnea on exertion. On 03/12/2017, she says she felt poorly that day. When her blood pressure was measured by nursing home staff, it was noted her systolic blood pressure was in the 220 range. [facility]. Initial troponin was mildly elevated at 0.1, subsequent  troponin trended up to 0.9. Given persistent dyspnea on exertion, cardiology was consulted for further evaluation. Chest x-ray showed small bilateral pleural effusions, however no consolidation or edema. Echocardiogram obtained on 03/13/2017 showed EF 55-60%, mild LVH, trivial pericardial effusion identified posterior to the heart.   Past Medical History:  Diagnosis Date  . Abnormal barium swallow 09/1999  . Asthma   . Carpal tunnel syndrome   . Erythema nodosum   . H/O Doppler ultrasound    LEFT LEG 11/1997  . Hypertension   . Impaired fasting glucose   . Psoriasis   . Rosacea   . Seizures (HCC) 1980  . Swallowing disorder   . Systolic murmur     Past Surgical History:  Procedure Laterality Date  . CHOLECYSTECTOMY    . COLONOSCOPY  2004  . INCISIONAL HERNIA REPAIR    . PACEMAKER IMPLANT N/A 03/09/2017   Procedure: Pacemaker Implant;  Surgeon: Hillis RangeAllred, James, MD;  Location: [facility];  Service: Cardiovascular;  Laterality: N/A;  . TONSILLECTOMY       Inpatient Medications: Scheduled Meds: . aspirin EC  81 mg Oral QHS  . enalapril  20 mg Oral Daily  . enoxaparin (LOVENOX) injection  40 mg Subcutaneous Q24H  . gabapentin  300 mg Oral QHS  . [START ON 03/14/2017] hydrochlorothiazide  12.5 mg Oral QODAY  . multivitamin  1 tablet Oral BID  . OXcarbazepine  150 mg Oral QPM   Continuous Infusions:  PRN Meds: acetaminophen **OR** acetaminophen, hydrALAZINE, ondansetron **OR** ondansetron (ZOFRAN) IV, traMADol  Allergies:    Allergies  Allergen Reactions  .  Celebrex [Celecoxib]     LEG ACHING   . Codeine Nausea And Vomiting  . Meloxicam     TUMMY ACHE, INEFFECTIVE  . Nortriptyline Other (See Comments)    unknown  . Percocet [Oxycodone-Acetaminophen]     unknown  . Penicillins Rash    Has patient had a PCN reaction causing immediate rash, facial/tongue/throat swelling, SOB or lightheadedness with hypotension: No Has patient had a PCN reaction causing severe rash  involving mucus membranes or skin necrosis: No Has patient had a PCN reaction that required hospitalization No Has patient had a PCN reaction occurring within the last 10 years: No If all of the above answers are "NO", then may proceed with Cephalosporin use.    Social History:   Social History   Social History  . Marital status: Widowed    Spouse name: N/A  . Number of children: 7  . Years of education: College   Occupational History  . RETIRED RN    Social History Main Topics  . Smoking status: Former Smoker    Quit date: 1982  . Smokeless tobacco: Never Used  . Alcohol use Yes     Comment: 1-2 times per week  . Drug use: No  . Sexual activity: Not on file   Other Topics Concern  . Not on file   Social History Narrative   Lives at home alone.   Right-handed.   Two cups caffeine per day.    Family History:   The patient's family history includes Breast cancer in her paternal grandmother; CVA in her maternal grandmother, mother, and paternal grandfather; Coronary artery disease in her brother; Diabetes in her paternal grandmother; Leukemia in her brother; Lung cancer in her paternal uncle; Pancreatic cancer in her brother and paternal aunt; Parkinson's disease in her father; Peripheral vascular disease in her maternal grandfather; Stroke in her maternal grandmother, mother, paternal aunt, and paternal grandfather.  ROS:  Please see the history of present illness.  ROS  All other ROS reviewed and negative.     Physical Exam/Data:   Vitals:   03/13/17 0606 03/13/17 0609 03/13/17 0627 03/13/17 1055  BP:  (!) 171/75 (!) 150/72 (!) 169/89  Pulse:  92 86 88  Resp:  18    Temp:  97.8 F (36.6 C)    TempSrc:  Oral    SpO2:  97%    Weight: 190 lb 1.6 oz (86.2 kg)     Height: 5\' 4"  (1.626 m)      No intake or output data in the 24 hours ending 03/13/17 1353 Filed Weights   03/13/17 0606  Weight: 190 lb 1.6 oz (86.2 kg)   Body mass index is 32.63 kg/m.  General:   Well nourished, well developed, in no acute distress HEENT: normal Lymph: no adenopathy Neck: no JVD Endocrine:  No thryomegaly Vascular: No carotid bruits; FA pulses 2+ bilaterally without bruits  Cardiac:  normal S1, S2; RRR; 2/6 murmur  Lungs:  clear to auscultation bilaterally, no wheezing, rhonchi or rales  Abd: soft, nontender, no hepatomegaly  Ext: no edema Musculoskeletal:  No deformities, BUE and BLE strength normal and equal Skin: warm and dry  Neuro:  CNs 2-12 intact, no focal abnormalities noted Psych:  Normal affect   EKG:  The EKG was personally reviewed and demonstrates:   Telemetry:  Telemetry was personally reviewed and demonstrates:  Paced rhythm  Relevant CV Studies:  Venous doppler 03/10/2017 Summary:  - No evidence of deep vein thrombosis involving the  visualized   veins of the right lower extremity. - No evidence of deep vein thrombosis involving the visualized   veins of the left lower extremity. - No evidence of Baker&'s cyst on the right or left.   Echo 03/13/2017 LV EF: 55% -   60%  Study Conclusions  - Left ventricle: The cavity size was normal. Wall thickness was   increased in a pattern of mild LVH. Systolic function was normal.   The estimated ejection fraction was in the range of 55% to 60%.   Doppler parameters are consistent with both elevated ventricular   end-diastolic filling pressure and elevated left atrial filling   pressure. - Aortic valve: Valve area (Vmax): 2.59 cm^2. - Mitral valve: Valve area by continuity equation (using LVOT   flow): 2.07 cm^2. - Left atrium: The atrium was mildly dilated. - Atrial septum: No defect or patent foramen ovale was identified. - Pericardium, extracardiac: A trivial pericardial effusion was   identified posterior to the heart.     Laboratory Data:  Chemistry Recent Labs Lab 03/08/17 1605 03/08/17 1632 03/12/17 2331 03/13/17 0539  NA 135 136 133* 133*  K 3.7 3.8 3.7 3.7  CL 102  100* 99* 101  CO2 24  --  25 25  GLUCOSE 107* 103* 104* 124*  BUN 11 11 5* <5*  CREATININE 0.55 0.50 0.52 0.54  CALCIUM 9.1  --  8.5* 8.7*  GFRNONAA >60  --  >60 >60  GFRAA >60  --  >60 >60  ANIONGAP 9  --  9 7     Recent Labs Lab 03/12/17 2331 03/13/17 0539  PROT 5.8* 6.5  ALBUMIN 3.0* 3.1*  AST 32 40  ALT 36 39  ALKPHOS 88 90  BILITOT 0.4 0.5   Hematology Recent Labs Lab 03/09/17 0502 03/12/17 2331 03/13/17 0539  WBC 7.7 7.4 10.7*  RBC 3.87 3.75* 4.41  HGB 11.2* 10.8* 12.5  HCT 33.9* 32.5* 37.9  MCV 87.6 86.7 85.9  MCH 28.9 28.8 28.3  MCHC 33.0 33.2 33.0  RDW 15.8* 15.2 15.0  PLT 258 215 244   Cardiac Enzymes Recent Labs Lab 03/13/17 0230 03/13/17 0539 03/13/17 0846  TROPONINI 0.11* 0.39* 0.92*    Recent Labs Lab 03/08/17 1627 03/13/17 0028  TROPIPOC 0.04 0.10*    BNP Recent Labs Lab 03/12/17 2331  BNP 161.4*    DDimer No results for input(s): DDIMER in the last 168 hours.  Radiology/Studies:  Dg Chest 2 View  Result Date: 03/13/2017 CLINICAL DATA:  Shortness of breath and chest discomfort. Hypertension. Previous smoker. EXAM: CHEST  2 VIEW COMPARISON:  03/10/2017 FINDINGS: Cardiac pacemaker. Shallow inspiration with atelectasis in the lung bases. Heart size and pulmonary vascularity are normal. No airspace disease or consolidation in the lungs. Blunting of the costophrenic angles suggesting small pleural effusions. No pneumothorax. Calcification of the aorta. Similar appearance to previous study. IMPRESSION: Shallow inspiration with linear atelectasis in the lung bases. Small bilateral pleural effusions. No consolidation or edema. Electronically Signed   By: Burman NievesWilliam  Stevens M.D.   On: 03/13/2017 00:22   Dg Chest 2 View  Result Date: 03/10/2017 CLINICAL DATA:  Pacemaker placement. EXAM: CHEST  2 VIEW COMPARISON:  03/08/2017 . FINDINGS: Cardiac pacer with lead tips in right atrium right ventricle. Heart size normal. Basilar subsegmental  atelectasis. Tiny bilateral pleural effusions cannot be excluded. Stable elevation right hemidiaphragm. No pneumothorax IMPRESSION: 1. Cardiac pacer with lead tips in right atrium and right ventricle. No pneumothorax. 2. Low lung volumes  with mild bibasilar subsegmental atelectasis . Small bilateral pleural effusions cannot be excluded. Electronically Signed   By: Maisie Fus  Register   On: 03/10/2017 07:36    Assessment and Plan:   1. Dyspnea on exertion: Unclear cause,  symptoms to have began even prior to recent pacemaker. Pt said she felt better the first couple days after defvice  Then SOB No sign of complication after pacemaker, no pneumothorax, no sign of significant anemia or pericardial effusion. Previously had a negative Myoview in October 2017. Noted patient spikes on the telemetry despite heart rate in the 90s, will obtain a device interrogation. Suspicion for PE at this time despite the patient complains of left leg pain, note negative venous Doppler obtained 3 days ago on 03/10/2017.  2. Elevated troponin: Troponin peaked at 0.9, trending up. Admitted with blood pressure in the 220 range. However given persistent dyspnea on exertion, may need to consider cardiac catheterization.   3. Hypertensive Urgency  BP is still elevated  Will increase meds  Add b blocker    4. 2-1 AV block s/p recent Medtronic dual-chamber pacemaker: No obvious complication, I have called the device rep interrogate the device today or tomorrow.    Ramond Dial, [place]  03/13/2017 1:53 PM   Pt seen and examined  I have reviewed and amended note above by Harrell Lark. Pt now dyspneic    On exam  :   Lungs rel clear  Cardiac exam RRR  No S3  Pacer site OK  Ext  Tr edema  Tele:  SR /ST Labs signif for troponin peak 0.9  Impression   Dyspnea  Concerning  She is milly tachycardic at times and Hypertensive  Sats 94 to 98% Not consistent with PE given high BP   Echo with normal LVEF Triv effusion.  (again  hypertensive) Ques coronary ischemia  Plan to get pacer interrogated Add b blocker to regimen Consider CT angio to r/o PE If negative consider Cath.  Dietrich Pates

## 2017-03-13 NOTE — Progress Notes (Signed)
  Echocardiogram 2D Echocardiogram has been performed.  Emorie Mcfate T Keyshun Elpers 03/13/2017, 10:50 AM

## 2017-03-13 NOTE — ED Notes (Signed)
Attempted report x1. 

## 2017-03-13 NOTE — Progress Notes (Signed)
Received report from Candace,RN in the ED. 

## 2017-03-13 NOTE — Progress Notes (Addendum)
NURSING PROGRESS NOTE  Sharon Gilmore Disch MRN: [id number] Admission Data: 03/13/17 at 0600 Attending Provider: Narda BondsNettey, Ralph A, MD PCP: Olivia CanterKelly, William, MD Code status: Full  Allergies:  Allergies  Allergen Reactions  . Celebrex [Celecoxib]     LEG ACHING   . Codeine Nausea And Vomiting  . Meloxicam     TUMMY ACHE, INEFFECTIVE  . Nortriptyline Other (See Comments)    unknown  . Percocet [Oxycodone-Acetaminophen]     unknown  . Penicillins Rash    Has patient had Gilmore PCN reaction causing immediate rash, facial/tongue/throat swelling, SOB or lightheadedness with hypotension: No Has patient had Gilmore PCN reaction causing severe rash involving mucus membranes or skin necrosis: No Has patient had Gilmore PCN reaction that required hospitalization No Has patient had Gilmore PCN reaction occurring within the last 10 years: No If all of the above answers are "NO", then may proceed with Cephalosporin use.   Past Medical History:  Past Medical History:  Diagnosis Date  . Abnormal barium swallow 09/1999  . Asthma   . Carpal tunnel syndrome   . Erythema nodosum   . H/O Doppler ultrasound    LEFT LEG 11/1997  . Hypertension   . Impaired fasting glucose   . Psoriasis   . Rosacea   . Seizures (HCC) 1980  . Swallowing disorder   . Systolic murmur    Past Surgical History:  Past Surgical History:  Procedure Laterality Date  . CHOLECYSTECTOMY    . COLONOSCOPY  2004  . INCISIONAL HERNIA REPAIR    . PACEMAKER IMPLANT N/Gilmore 03/09/2017   Procedure: Pacemaker Implant;  Surgeon: Hillis RangeAllred, James, MD;  Location: [facility];  Service: Cardiovascular;  Laterality: N/Gilmore;  . TONSILLECTOMY     Sharon Gilmore Sharon Gilmore is Gilmore 83 y.o. female patient, arrived to floor in room [id number] via stretcher, transferred from ED. Patient alert and oriented X 4. No acute distress noted. Denies pain and states, "I just want to sleep."   Vital signs: Oral temperature 97.8 F (36.6 C), Blood pressure 171/75, Pulse 92, RR 18, SpO2  97 % on room air. Height 5'4", weight 190.1 lbs (86.2 kg).   Patient placed on telemetry box [id number]. Second verified by Denton ArKaelin R., RN.  IV access: Right AC-saline locked; condition patent and no redness.  Skin: intact, no pressure ulcer noted in sacral area. Psoriasis noted on bilateral elbows, hands, and chest. Second verified by Berna BueSonia C., RN  Patient's ID armband verified with patient and in place. Information packet given to patient. Fall risk assessed, SR up X2, patient able to verbalize understanding of risks associated with falls and to call nurse or staff to assist before getting out of bed. Patient oriented to room and equipment. Call bell within reach.

## 2017-03-14 ENCOUNTER — Inpatient Hospital Stay (HOSPITAL_COMMUNITY): Payer: Medicare Other

## 2017-03-14 DIAGNOSIS — I161 Hypertensive emergency: Secondary | ICD-10-CM | POA: Diagnosis not present

## 2017-03-14 DIAGNOSIS — M4805 Spinal stenosis, thoracolumbar region: Secondary | ICD-10-CM | POA: Diagnosis not present

## 2017-03-14 DIAGNOSIS — J90 Pleural effusion, not elsewhere classified: Secondary | ICD-10-CM | POA: Diagnosis present

## 2017-03-14 DIAGNOSIS — Z87891 Personal history of nicotine dependence: Secondary | ICD-10-CM | POA: Diagnosis not present

## 2017-03-14 DIAGNOSIS — D649 Anemia, unspecified: Secondary | ICD-10-CM | POA: Diagnosis present

## 2017-03-14 DIAGNOSIS — Z885 Allergy status to narcotic agent status: Secondary | ICD-10-CM | POA: Diagnosis not present

## 2017-03-14 DIAGNOSIS — Z7982 Long term (current) use of aspirin: Secondary | ICD-10-CM | POA: Diagnosis not present

## 2017-03-14 DIAGNOSIS — E782 Mixed hyperlipidemia: Secondary | ICD-10-CM | POA: Diagnosis not present

## 2017-03-14 DIAGNOSIS — R748 Abnormal levels of other serum enzymes: Secondary | ICD-10-CM | POA: Diagnosis not present

## 2017-03-14 DIAGNOSIS — I160 Hypertensive urgency: Secondary | ICD-10-CM | POA: Diagnosis not present

## 2017-03-14 DIAGNOSIS — Z8709 Personal history of other diseases of the respiratory system: Secondary | ICD-10-CM | POA: Diagnosis not present

## 2017-03-14 DIAGNOSIS — J45909 Unspecified asthma, uncomplicated: Secondary | ICD-10-CM | POA: Diagnosis present

## 2017-03-14 DIAGNOSIS — Z950 Presence of cardiac pacemaker: Secondary | ICD-10-CM | POA: Diagnosis not present

## 2017-03-14 DIAGNOSIS — R0600 Dyspnea, unspecified: Secondary | ICD-10-CM | POA: Diagnosis not present

## 2017-03-14 DIAGNOSIS — I214 Non-ST elevation (NSTEMI) myocardial infarction: Secondary | ICD-10-CM | POA: Diagnosis not present

## 2017-03-14 DIAGNOSIS — Z880 Allergy status to penicillin: Secondary | ICD-10-CM | POA: Diagnosis not present

## 2017-03-14 DIAGNOSIS — I2510 Atherosclerotic heart disease of native coronary artery without angina pectoris: Secondary | ICD-10-CM | POA: Diagnosis not present

## 2017-03-14 DIAGNOSIS — I248 Other forms of acute ischemic heart disease: Secondary | ICD-10-CM | POA: Diagnosis present

## 2017-03-14 DIAGNOSIS — R0602 Shortness of breath: Secondary | ICD-10-CM | POA: Diagnosis present

## 2017-03-14 DIAGNOSIS — Z79899 Other long term (current) drug therapy: Secondary | ICD-10-CM | POA: Diagnosis not present

## 2017-03-14 DIAGNOSIS — Z888 Allergy status to other drugs, medicaments and biological substances status: Secondary | ICD-10-CM | POA: Diagnosis not present

## 2017-03-14 LAB — CBC
HCT: 34.8 % — ABNORMAL LOW (ref 36.0–46.0)
HEMOGLOBIN: 11.6 g/dL — AB (ref 12.0–15.0)
MCH: 28.6 pg (ref 26.0–34.0)
MCHC: 33.3 g/dL (ref 30.0–36.0)
MCV: 85.7 fL (ref 78.0–100.0)
Platelets: 239 10*3/uL (ref 150–400)
RBC: 4.06 MIL/uL (ref 3.87–5.11)
RDW: 15.3 % (ref 11.5–15.5)
WBC: 8.3 10*3/uL (ref 4.0–10.5)

## 2017-03-14 LAB — HEPARIN LEVEL (UNFRACTIONATED)
HEPARIN UNFRACTIONATED: 0.19 [IU]/mL — AB (ref 0.30–0.70)
HEPARIN UNFRACTIONATED: 0.1 [IU]/mL — AB (ref 0.30–0.70)

## 2017-03-14 MED ORDER — SODIUM CHLORIDE 0.9 % IV SOLN: 250.0000 mL | INTRAVENOUS | Status: DC | PRN

## 2017-03-14 MED ORDER — SODIUM CHLORIDE 0.9 % IV SOLN
INTRAVENOUS | Status: DC
  Administered 2017-03-14: 23:00:00 via INTRAVENOUS

## 2017-03-14 MED ORDER — SODIUM CHLORIDE 0.9% FLUSH: 3.0000 mL | INTRAVENOUS | Status: DC | PRN

## 2017-03-14 MED ORDER — SODIUM CHLORIDE 0.9% FLUSH
3.0000 mL | Freq: Two times a day (BID) | INTRAVENOUS | Status: DC
  Administered 2017-03-14 – 2017-03-15 (×2): 3 mL via INTRAVENOUS

## 2017-03-14 MED ORDER — IOPAMIDOL (ISOVUE-370) INJECTION 76%
INTRAVENOUS | Status: AC
  Administered 2017-03-14: 100 mL via INTRAVENOUS
  Filled 2017-03-14: qty 100

## 2017-03-14 MED ORDER — ASPIRIN 81 MG PO CHEW
81.0000 mg | CHEWABLE_TABLET | ORAL | Status: AC
  Administered 2017-03-15: 81 mg via ORAL
  Filled 2017-03-14: qty 1

## 2017-03-14 NOTE — Progress Notes (Signed)
Progress Note  Patient Name: Sharon Gilmore Date of Encounter: 03/14/2017  Primary Cardiologist: Allred  Subjective  Slept well  Breathing is better this AM  No signif CP    Inpatient Medications    Scheduled Meds: . aspirin EC  81 mg Oral QHS  . enalapril  20 mg Oral Daily  . gabapentin  300 mg Oral QHS  . hydrochlorothiazide  12.5 mg Oral QODAY  . metoprolol tartrate  25 mg Oral QID  . multivitamin  1 tablet Oral BID  . OXcarbazepine  150 mg Oral QPM   Continuous Infusions: . heparin 850 Units/hr (03/14/17 0648)   PRN Meds: acetaminophen **OR** acetaminophen, hydrALAZINE, ondansetron **OR** ondansetron (ZOFRAN) IV, traMADol   Vital Signs    Vitals:   03/13/17 1848 03/13/17 2000 03/13/17 2138 03/14/17 0640  BP: (!) 137/56 (!) 152/66 (!) 160/78 (!) 161/69  Pulse:   88 76  Resp:   16 18  Temp:   97.7 F (36.5 C) 98 F (36.7 C)  TempSrc:   Oral Oral  SpO2:   95% 95%  Weight:    189 lb 6.4 oz (85.9 kg)  Height:        Intake/Output Summary (Last 24 hours) at 03/14/17 0711 Last data filed at 03/14/17 [id number]  Gross per 24 hour  Intake           415.75 ml  Output              300 ml  Net           115.75 ml   Filed Weights   03/13/17 0606 03/14/17 0640  Weight: 190 lb 1.6 oz (86.2 kg) 189 lb 6.4 oz (85.9 kg)    Telemetry    SR/ST   - Personally Reviewed  ECG      Physical Exam   GEN: No acute distress.   Neck: No JVD Cardiac: RRR, no murmurs, rubs, or gallops.  Respiratory: Rales at R base   GI: Soft, nontender, non-distended  MS: No edema; No deformity. Neuro:  Nonfocal  Psych: Normal affect   Labs    Chemistry Recent Labs Lab 03/08/17 1605 03/08/17 1632 03/12/17 2331 03/13/17 0539  NA 135 136 133* 133*  K 3.7 3.8 3.7 3.7  CL 102 100* 99* 101  CO2 24  --  25 25  GLUCOSE 107* 103* 104* 124*  BUN 11 11 5* <5*  CREATININE 0.55 0.50 0.52 0.54  CALCIUM 9.1  --  8.5* 8.7*  PROT  --   --  5.8* 6.5  ALBUMIN  --   --  3.0* 3.1*  AST   --   --  32 40  ALT  --   --  36 39  ALKPHOS  --   --  88 90  BILITOT  --   --  0.4 0.5  GFRNONAA >60  --  >60 >60  GFRAA >60  --  >60 >60  ANIONGAP 9  --  9 7     Hematology Recent Labs Lab 03/12/17 2331 03/13/17 0539 03/14/17 0520  WBC 7.4 10.7* 8.3  RBC 3.75* 4.41 4.06  HGB 10.8* 12.5 11.6*  HCT 32.5* 37.9 34.8*  MCV 86.7 85.9 85.7  MCH 28.8 28.3 28.6  MCHC 33.2 33.0 33.3  RDW 15.2 15.0 15.3  PLT 215 244 239    Cardiac Enzymes Recent Labs Lab 03/13/17 0230 03/13/17 0539 03/13/17 0846 03/13/17 1759  TROPONINI 0.11* 0.39* 0.92* 1.57*    Recent  Labs Lab 03/08/17 1627 03/13/17 0028  TROPIPOC 0.04 0.10*     BNP Recent Labs Lab 03/12/17 2331  BNP 161.4*     DDimer  Recent Labs Lab 03/13/17 2253  DDIMER 1.96*     Radiology    Dg Chest 2 View  Result Date: 03/13/2017 CLINICAL DATA:  Shortness of breath and chest discomfort. Hypertension. Previous smoker. EXAM: CHEST  2 VIEW COMPARISON:  03/10/2017 FINDINGS: Cardiac pacemaker. Shallow inspiration with atelectasis in the lung bases. Heart size and pulmonary vascularity are normal. No airspace disease or consolidation in the lungs. Blunting of the costophrenic angles suggesting small pleural effusions. No pneumothorax. Calcification of the aorta. Similar appearance to previous study. IMPRESSION: Shallow inspiration with linear atelectasis in the lung bases. Small bilateral pleural effusions. No consolidation or edema. Electronically Signed   By: Burman NievesWilliam  Stevens M.D.   On: 03/13/2017 00:22    Cardiac Studies     Patient Profile     83 y.o. female with recent PPM placement on 7/24   Developed SOB a couple days later  Echo:  LVEF normal  Triv effusion  LE doppler neg for DVT (sl swelling).  Admitted on 7/28.    Assessment & Plan    1  Elevated troponin  Last night 1.57   D Dimer elevated at 1.96 Curr on IV heparin   Will get CT r/o PE today although I am not convinced taht   If negative will set up  for L heart cath tomorrow Pt understands  Agrees to proceed  WIll need hydration prior   2  PPM  Interrogated  Functioning normally  No arrhythmias   Signed, Dietrich PatesPaula Reesha Debes, MD  03/14/2017, 7:11 AM

## 2017-03-14 NOTE — Progress Notes (Signed)
PROGRESS NOTE    Sharon Gilmore  ZOX:[id number] DOB: 1933/10/08 DOA: 03/12/2017 PCP: Olivia CanterKelly, William, MD   Brief Narrative: Sharon EvaJudith A Percifield is a 83 y.o. female with a history of hypertension, status post pacemaker for heart block. Patient presented with hypertensive emergency significant for blood pressures with systolic values in 200s and associated elevated troponin. Cardiology consulted. D-dimer elevated cell CTA is pending.   Assessment & Plan:   Principal Problem:   Hypertensive urgency Active Problems:   Spinal stenosis   History of asthma   SOB (shortness of breath)   Elevated troponin   Hypertensive emergency Evidence of end organ damage with elevated troponin. Possible PE. Wells score of 0 but d-dimer elevated. -cardiology recommendations: CTA, metoprolol QID -continue hydralazine prn  Elevated troponin Paired with exertional dyspnea, concern for ACS. Recent stress test from 2017 was normal. Still trending up -Cardiology recommendations: if no PE, cardiac cath; heparin -trend troponin  Spinal stenosis -continue Trileptal and Neurontin  Second degree heart block Patient is status post pacer placement a few days ago. Pacer interrogated per cardiology and no arrhythmias noted.   DVT prophylaxis: heparin drip Code Status: full code Family Communication: none at bedside Disposition Plan: anticipate discharge tomorrow if cardiac catheterization is normal   Consultants:   cardiology  Procedures:   Echocardiogram (7/28): EF of 55% to 60% with evidence of LVH, mildly dilated left atrium, trivial pericardial effusion  Antimicrobials:  none    Subjective: Patient reports no dyspnea or chest pain.  Objective: Vitals:   03/13/17 1848 03/13/17 2000 03/13/17 2138 03/14/17 0640  BP: (!) 137/56 (!) 152/66 (!) 160/78 (!) 161/69  Pulse:   88 76  Resp:   16 18  Temp:   97.7 F (36.5 C) 98 F (36.7 C)  TempSrc:   Oral Oral  SpO2:   95% 95%  Weight:     85.9 kg (189 lb 6.4 oz)  Height:        Intake/Output Summary (Last 24 hours) at 03/14/17 0934 Last data filed at 03/14/17 0916  Gross per 24 hour  Intake           775.75 ml  Output              500 ml  Net           275.75 ml   Filed Weights   03/13/17 0606 03/14/17 0640  Weight: 86.2 kg (190 lb 1.6 oz) 85.9 kg (189 lb 6.4 oz)    Examination:  General exam: Appears calm and comfortable Respiratory system: Clear to auscultation. Respiratory effort normal. Cardiovascular system: S1 & S2 heard, RRR. No murmurs, rubs, gallops or clicks. Gastrointestinal system: Abdomen is nondistended, soft and nontender. No organomegaly or masses felt. Normal bowel sounds heard. Central nervous system: Alert and oriented. No focal neurological deficits. Extremities: No edema. No calf tenderness Skin: No cyanosis. No rashes Psychiatry: Judgement and insight appear normal. Mood & affect appropriate.     Data Reviewed: I have personally reviewed following labs and imaging studies  CBC:  Recent Labs Lab 03/08/17 1605 03/08/17 1632 03/09/17 0502 03/12/17 2331 03/13/17 0539 03/14/17 0520  WBC 10.1  --  7.7 7.4 10.7* 8.3  NEUTROABS  --   --  4.8 4.5  --   --   HGB 11.6* 12.2 11.2* 10.8* 12.5 11.6*  HCT 34.7* 36.0 33.9* 32.5* 37.9 34.8*  MCV 87.0  --  87.6 86.7 85.9 85.7  PLT 289  --  258 215 244  239   Basic Metabolic Panel:  Recent Labs Lab 03/08/17 1605 03/08/17 1632 03/09/17 0502 03/12/17 2331 03/13/17 0539  NA 135 136  --  133* 133*  K 3.7 3.8  --  3.7 3.7  CL 102 100*  --  99* 101  CO2 24  --   --  25 25  GLUCOSE 107* 103*  --  104* 124*  BUN 11 11  --  5* <5*  CREATININE 0.55 0.50  --  0.52 0.54  CALCIUM 9.1  --   --  8.5* 8.7*  MG 2.0  --  2.0  --  2.0   GFR: Estimated Creatinine Clearance: 59.5 mL/min (by C-G formula based on SCr of 0.54 mg/dL). Liver Function Tests:  Recent Labs Lab 03/12/17 2331 03/13/17 0539  AST 32 40  ALT 36 39  ALKPHOS 88 90  BILITOT  0.4 0.5  PROT 5.8* 6.5  ALBUMIN 3.0* 3.1*   No results for input(s): LIPASE, AMYLASE in the last 168 hours. No results for input(s): AMMONIA in the last 168 hours. Coagulation Profile:  Recent Labs Lab 03/09/17 0502  INR 1.27   Cardiac Enzymes:  Recent Labs Lab 03/13/17 0230 03/13/17 0539 03/13/17 0846 03/13/17 1759  TROPONINI 0.11* 0.39* 0.92* 1.57*   BNP (last 3 results) No results for input(s): PROBNP in the last 8760 hours. HbA1C: No results for input(s): HGBA1C in the last 72 hours. CBG: No results for input(s): GLUCAP in the last 168 hours. Lipid Profile: No results for input(s): CHOL, HDL, LDLCALC, TRIG, CHOLHDL, LDLDIRECT in the last 72 hours. Thyroid Function Tests:  Recent Labs  03/13/17 0539  TSH 2.010   Anemia Panel: No results for input(s): VITAMINB12, FOLATE, FERRITIN, TIBC, IRON, RETICCTPCT in the last 72 hours. Sepsis Labs: No results for input(s): PROCALCITON, LATICACIDVEN in the last 168 hours.  Recent Results (from the past 240 hour(s))  MRSA PCR Screening     Status: None   Collection Time: 03/08/17  9:08 PM  Result Value Ref Range Status   MRSA by PCR NEGATIVE NEGATIVE Final    Comment:        The GeneXpert MRSA Assay (FDA approved for NASAL specimens only), is one component of a comprehensive MRSA colonization surveillance program. It is not intended to diagnose MRSA infection nor to guide or monitor treatment for MRSA infections.          Radiology Studies: Dg Chest 2 View  Result Date: 03/13/2017 CLINICAL DATA:  Shortness of breath and chest discomfort. Hypertension. Previous smoker. EXAM: CHEST  2 VIEW COMPARISON:  03/10/2017 FINDINGS: Cardiac pacemaker. Shallow inspiration with atelectasis in the lung bases. Heart size and pulmonary vascularity are normal. No airspace disease or consolidation in the lungs. Blunting of the costophrenic angles suggesting small pleural effusions. No pneumothorax. Calcification of the aorta.  Similar appearance to previous study. IMPRESSION: Shallow inspiration with linear atelectasis in the lung bases. Small bilateral pleural effusions. No consolidation or edema. Electronically Signed   By: Burman NievesWilliam  Stevens M.D.   On: 03/13/2017 00:22        Scheduled Meds: . aspirin EC  81 mg Oral QHS  . enalapril  20 mg Oral Daily  . gabapentin  300 mg Oral QHS  . hydrochlorothiazide  12.5 mg Oral QODAY  . metoprolol tartrate  25 mg Oral QID  . multivitamin  1 tablet Oral BID  . OXcarbazepine  150 mg Oral QPM   Continuous Infusions: . heparin 850 Units/hr (03/14/17 [id number])  LOS: 0 days     Jacquelin Hawking, MD [facility] 03/14/2017, 9:34 AM Pager: [phone]  If 7PM-7AM, please contact night-coverage [email or web address] Password [facility] 03/14/2017, 9:34 AM

## 2017-03-14 NOTE — Progress Notes (Signed)
ANTICOAGULATION CONSULT NOTE - Follow Up Consult  Pharmacy Consult for Heparin Indication: chest pain/ACS  Allergies  Allergen Reactions  . Celebrex [Celecoxib]     LEG ACHING   . Codeine Nausea And Vomiting  . Meloxicam     TUMMY ACHE, INEFFECTIVE  . Nortriptyline Other (See Comments)    unknown  . Percocet [Oxycodone-Acetaminophen]     unknown  . Penicillins Rash    Has patient had a PCN reaction causing immediate rash, facial/tongue/throat swelling, SOB or lightheadedness with hypotension: No Has patient had a PCN reaction causing severe rash involving mucus membranes or skin necrosis: No Has patient had a PCN reaction that required hospitalization No Has patient had a PCN reaction occurring within the last 10 years: No If all of the above answers are "NO", then may proceed with Cephalosporin use.    Patient Measurements: Height: 5\' 4"  (162.6 cm) Weight: 189 lb 6.4 oz (85.9 kg) IBW/kg (Calculated) : 54.7  Dosing weight: 74kg  Vital Signs: Temp: 97.7 F (36.5 C) (07/29 1440) Temp Source: Oral (07/29 1440) BP: 160/66 (07/29 1440) Pulse Rate: 76 (07/29 1440)  Labs:  Recent Labs  03/12/17 2331  03/13/17 0539 03/13/17 0846 03/13/17 1759 03/14/17 0520 03/14/17 1409  HGB 10.8*  --  12.5  --   --  11.6*  --   HCT 32.5*  --  37.9  --   --  34.8*  --   PLT 215  --  244  --   --  239  --   HEPARINUNFRC  --   --   --   --   --  0.19* 0.10*  CREATININE 0.52  --  0.54  --   --   --   --   TROPONINI  --   < > 0.39* 0.92* 1.57*  --   --   < > = values in this interval not displayed.  Estimated Creatinine Clearance: 59.5 mL/min (by C-G formula based on SCr of 0.54 mg/dL).   Medications:  Scheduled:  . aspirin EC  81 mg Oral QHS  . enalapril  20 mg Oral Daily  . gabapentin  300 mg Oral QHS  . hydrochlorothiazide  12.5 mg Oral QODAY  . metoprolol tartrate  25 mg Oral QID  . multivitamin  1 tablet Oral BID  . OXcarbazepine  150 mg Oral QPM    Assessment: 82yo  female on heparin for ACS and is s/p pacer implant on 7/24, shooting for lower goal (No bolus per cardiology). Heparin level remains low with trend down (0.19>>0.1)   Goal of Therapy:  Heparin level 0.3-0.5 units/ml Monitor platelets by anticoagulation protocol: Yes   Plan:  Increase heparin to 1100 units/hr Repeat heparin level 6hr Daily heparin level, CBC  Harland GermanAndrew Himani Corona, Pharm D 03/14/2017 4:16 PM

## 2017-03-14 NOTE — Progress Notes (Signed)
ANTICOAGULATION CONSULT NOTE - Follow Up Consult  Pharmacy Consult for Heparin Indication: chest pain/ACS  Allergies  Allergen Reactions  . Celebrex [Celecoxib]     LEG ACHING   . Codeine Nausea And Vomiting  . Meloxicam     TUMMY ACHE, INEFFECTIVE  . Nortriptyline Other (See Comments)    unknown  . Percocet [Oxycodone-Acetaminophen]     unknown  . Penicillins Rash    Has patient had a PCN reaction causing immediate rash, facial/tongue/throat swelling, SOB or lightheadedness with hypotension: No Has patient had a PCN reaction causing severe rash involving mucus membranes or skin necrosis: No Has patient had a PCN reaction that required hospitalization No Has patient had a PCN reaction occurring within the last 10 years: No If all of the above answers are "NO", then may proceed with Cephalosporin use.    Patient Measurements: Height: 5\' 4"  (162.6 cm) Weight: 189 lb 6.4 oz (85.9 kg) IBW/kg (Calculated) : 54.7  Vital Signs: Temp: 98 F (36.7 C) (07/29 0640) Temp Source: Oral (07/29 0640) BP: 161/69 (07/29 0640) Pulse Rate: 76 (07/29 0640)  Labs:  Recent Labs  03/12/17 2331  03/13/17 0539 03/13/17 0846 03/13/17 1759 03/14/17 0520  HGB 10.8*  --  12.5  --   --  11.6*  HCT 32.5*  --  37.9  --   --  34.8*  PLT 215  --  244  --   --  239  HEPARINUNFRC  --   --   --   --   --  0.19*  CREATININE 0.52  --  0.54  --   --   --   TROPONINI  --   < > 0.39* 0.92* 1.57*  --   < > = values in this interval not displayed.  Estimated Creatinine Clearance: 59.5 mL/min (by C-G formula based on SCr of 0.54 mg/dL).   Medications:  Scheduled:  . aspirin EC  81 mg Oral QHS  . enalapril  20 mg Oral Daily  . gabapentin  300 mg Oral QHS  . hydrochlorothiazide  12.5 mg Oral QODAY  . metoprolol tartrate  25 mg Oral QID  . multivitamin  1 tablet Oral BID  . OXcarbazepine  150 mg Oral QPM    Assessment: 82yo female on heparin for ACS and is s/p pacer implant on 7/24, shooting for  lower goal.  Heparin level is low and there have been no issues with the IV pump.  No bleeding noted.  Goal of Therapy:  Heparin level 0.3-0.5 units/ml Monitor platelets by anticoagulation protocol: Yes   Plan:  Increase heparin to 850 units/hr Repeat heparin level 8hr Daily heparin level, CBC   Marisue HumbleKendra Shawni Volkov, B.S., PharmD Clinical Pharmacist [facility]

## 2017-03-14 NOTE — Progress Notes (Deleted)
ANTICOAGULATION CONSULT NOTE - Follow Up Consult  Pharmacy Consult for Heparin Indication: chest pain/ACS  Allergies  Allergen Reactions  . Celebrex [Celecoxib]     LEG ACHING   . Codeine Nausea And Vomiting  . Meloxicam     TUMMY ACHE, INEFFECTIVE  . Nortriptyline Other (See Comments)    unknown  . Percocet [Oxycodone-Acetaminophen]     unknown  . Penicillins Rash    Has patient had a PCN reaction causing immediate rash, facial/tongue/throat swelling, SOB or lightheadedness with hypotension: No Has patient had a PCN reaction causing severe rash involving mucus membranes or skin necrosis: No Has patient had a PCN reaction that required hospitalization No Has patient had a PCN reaction occurring within the last 10 years: No If all of the above answers are "NO", then may proceed with Cephalosporin use.    Patient Measurements: Height: 5\' 4"  (162.6 cm) Weight: 189 lb 6.4 oz (85.9 kg) IBW/kg (Calculated) : 54.7  Vital Signs: Temp: 97.7 F (36.5 C) (07/29 1440) Temp Source: Oral (07/29 1440) BP: 160/66 (07/29 1440) Pulse Rate: 76 (07/29 1440)  Labs:  Recent Labs  03/12/17 2331  03/13/17 0539 03/13/17 0846 03/13/17 1759 03/14/17 0520 03/14/17 1409  HGB 10.8*  --  12.5  --   --  11.6*  --   HCT 32.5*  --  37.9  --   --  34.8*  --   PLT 215  --  244  --   --  239  --   HEPARINUNFRC  --   --   --   --   --  0.19* 0.10*  CREATININE 0.52  --  0.54  --   --   --   --   TROPONINI  --   < > 0.39* 0.92* 1.57*  --   --   < > = values in this interval not displayed.  Estimated Creatinine Clearance: 59.5 mL/min (by C-G formula based on SCr of 0.54 mg/dL).   Medications:  Scheduled:  . aspirin EC  81 mg Oral QHS  . enalapril  20 mg Oral Daily  . gabapentin  300 mg Oral QHS  . hydrochlorothiazide  12.5 mg Oral QODAY  . metoprolol tartrate  25 mg Oral QID  . multivitamin  1 tablet Oral BID  . OXcarbazepine  150 mg Oral QPM    Assessment: 83yo female on heparin for ACS  and is s/p pacer implant on 7/24, shooting for lower goal. No bolus per cardiology. Last two heparin levels have been low, 0.19 then 0.10. There have been no issues with the IV pump.  No bleeding noted.  Goal of Therapy:  Heparin level 0.3-0.5 units/ml Monitor platelets by anticoagulation protocol: Yes   Plan:  Increase heparin to 1150 units/hr Repeat heparin level 6hr Daily heparin level, CBC  Adline PotterSabrina Gianpaolo Mindel, PharmD Pharmacy Resident Pager: [phone]

## 2017-03-15 ENCOUNTER — Encounter (HOSPITAL_COMMUNITY)
Admission: EM | Disposition: A | Discharge status: Skilled Nursing Facility | Payer: Self-pay | Source: Home / Self Care | Attending: Family Medicine

## 2017-03-15 DIAGNOSIS — R748 Abnormal levels of other serum enzymes: Secondary | ICD-10-CM

## 2017-03-15 DIAGNOSIS — R0602 Shortness of breath: Secondary | ICD-10-CM

## 2017-03-15 DIAGNOSIS — I214 Non-ST elevation (NSTEMI) myocardial infarction: Secondary | ICD-10-CM

## 2017-03-15 DIAGNOSIS — I2510 Atherosclerotic heart disease of native coronary artery without angina pectoris: Secondary | ICD-10-CM

## 2017-03-15 HISTORY — PX: LEFT HEART CATH AND CORONARY ANGIOGRAPHY: CATH118249

## 2017-03-15 LAB — HEPARIN LEVEL (UNFRACTIONATED)
HEPARIN UNFRACTIONATED: 0.27 [IU]/mL — AB (ref 0.30–0.70)
Heparin Unfractionated: 0.15 IU/mL — ABNORMAL LOW (ref 0.30–0.70)

## 2017-03-15 LAB — CBC
HEMATOCRIT: 34.4 % — AB (ref 36.0–46.0)
Hemoglobin: 11.1 g/dL — ABNORMAL LOW (ref 12.0–15.0)
MCH: 27.7 pg (ref 26.0–34.0)
MCHC: 32.3 g/dL (ref 30.0–36.0)
MCV: 85.8 fL (ref 78.0–100.0)
PLATELETS: 227 10*3/uL (ref 150–400)
RBC: 4.01 MIL/uL (ref 3.87–5.11)
RDW: 15.3 % (ref 11.5–15.5)
WBC: 8.2 10*3/uL (ref 4.0–10.5)

## 2017-03-15 LAB — TROPONIN I: Troponin I: 0.45 ng/mL (ref <0.03)

## 2017-03-15 SURGERY — LEFT HEART CATH AND CORONARY ANGIOGRAPHY
Anesthesia: LOCAL

## 2017-03-15 MED ORDER — ATORVASTATIN CALCIUM 20 MG PO TABS: 20.0000 mg | ORAL_TABLET | Freq: Every day | ORAL | 0 refills | Status: DC

## 2017-03-15 MED ORDER — AMLODIPINE BESYLATE 5 MG PO TABS
2.5000 mg | ORAL_TABLET | Freq: Every day | ORAL | Status: DC
  Administered 2017-03-15 – 2017-03-16 (×2): 2.5 mg via ORAL
  Filled 2017-03-15 (×3): qty 1

## 2017-03-15 MED ORDER — MIDAZOLAM HCL 2 MG/2ML IJ SOLN
INTRAMUSCULAR | Status: AC
  Filled 2017-03-15: qty 2

## 2017-03-15 MED ORDER — HYDRALAZINE HCL 20 MG/ML IJ SOLN
5.0000 mg | INTRAMUSCULAR | Status: DC | PRN
  Administered 2017-03-15 – 2017-03-16 (×2): 5 mg via INTRAVENOUS
  Filled 2017-03-15 (×2): qty 1

## 2017-03-15 MED ORDER — IOPAMIDOL (ISOVUE-370) INJECTION 76%
INTRAVENOUS | Status: DC | PRN
  Administered 2017-03-15: 55 mL via INTRA_ARTERIAL

## 2017-03-15 MED ORDER — IOPAMIDOL (ISOVUE-370) INJECTION 76%
INTRAVENOUS | Status: AC
  Filled 2017-03-15: qty 100

## 2017-03-15 MED ORDER — ONDANSETRON HCL 4 MG/2ML IJ SOLN: 4.0000 mg | Freq: Four times a day (QID) | INTRAMUSCULAR | Status: DC | PRN

## 2017-03-15 MED ORDER — SODIUM CHLORIDE 0.9 % IV SOLN
INTRAVENOUS | Status: AC
  Administered 2017-03-15: 17:00:00 via INTRAVENOUS

## 2017-03-15 MED ORDER — HEPARIN SODIUM (PORCINE) 1000 UNIT/ML IJ SOLN
INTRAMUSCULAR | Status: DC | PRN
  Administered 2017-03-15: 4500 [IU] via INTRAVENOUS

## 2017-03-15 MED ORDER — VERAPAMIL HCL 2.5 MG/ML IV SOLN
INTRA_ARTERIAL | Status: DC | PRN
  Administered 2017-03-15: 16:00:00 via INTRA_ARTERIAL

## 2017-03-15 MED ORDER — LORAZEPAM 2 MG/ML IJ SOLN: 1.0000 mg | Freq: Once | INTRAMUSCULAR | Status: DC

## 2017-03-15 MED ORDER — HYDROCHLOROTHIAZIDE 25 MG PO TABS
25.0000 mg | ORAL_TABLET | Freq: Every day | ORAL | Status: DC
  Administered 2017-03-15 – 2017-03-17 (×3): 25 mg via ORAL
  Filled 2017-03-15 (×3): qty 1

## 2017-03-15 MED ORDER — SODIUM CHLORIDE 0.9% FLUSH: 3.0000 mL | INTRAVENOUS | Status: DC | PRN

## 2017-03-15 MED ORDER — MIDAZOLAM HCL 2 MG/2ML IJ SOLN
INTRAMUSCULAR | Status: DC | PRN
  Administered 2017-03-15: 1 mg via INTRAVENOUS

## 2017-03-15 MED ORDER — HEPARIN SODIUM (PORCINE) 1000 UNIT/ML IJ SOLN
INTRAMUSCULAR | Status: AC
  Filled 2017-03-15: qty 1

## 2017-03-15 MED ORDER — ACETAMINOPHEN 325 MG PO TABS: 650.0000 mg | ORAL_TABLET | ORAL | Status: DC | PRN

## 2017-03-15 MED ORDER — VERAPAMIL HCL 2.5 MG/ML IV SOLN
INTRAVENOUS | Status: AC
  Filled 2017-03-15: qty 2

## 2017-03-15 MED ORDER — HEPARIN (PORCINE) IN NACL 2-0.9 UNIT/ML-% IJ SOLN
INTRAMUSCULAR | Status: AC
  Filled 2017-03-15: qty 1000

## 2017-03-15 MED ORDER — HEPARIN (PORCINE) IN NACL 2-0.9 UNIT/ML-% IJ SOLN
INTRAMUSCULAR | Status: AC | PRN
  Administered 2017-03-15: 1000 mL via INTRA_ARTERIAL

## 2017-03-15 MED ORDER — SODIUM CHLORIDE 0.9 % IV SOLN: 250.0000 mL | INTRAVENOUS | Status: DC | PRN

## 2017-03-15 MED ORDER — SODIUM CHLORIDE 0.9% FLUSH
3.0000 mL | Freq: Two times a day (BID) | INTRAVENOUS | Status: DC
  Administered 2017-03-15 – 2017-03-17 (×4): 3 mL via INTRAVENOUS

## 2017-03-15 MED ORDER — NITROGLYCERIN 1 MG/10 ML FOR IR/CATH LAB
INTRA_ARTERIAL | Status: AC
  Filled 2017-03-15: qty 10

## 2017-03-15 MED ORDER — FENTANYL CITRATE (PF) 100 MCG/2ML IJ SOLN
INTRAMUSCULAR | Status: DC | PRN
  Administered 2017-03-15: 25 ug via INTRAVENOUS

## 2017-03-15 MED ORDER — ATORVASTATIN CALCIUM 20 MG PO TABS: 20.0000 mg | ORAL_TABLET | Freq: Every day | ORAL | Status: DC

## 2017-03-15 MED ORDER — FENTANYL CITRATE (PF) 100 MCG/2ML IJ SOLN
INTRAMUSCULAR | Status: AC
  Filled 2017-03-15: qty 2

## 2017-03-15 MED ORDER — LIDOCAINE HCL (PF) 1 % IJ SOLN
INTRAMUSCULAR | Status: DC | PRN
  Administered 2017-03-15: 5 mL

## 2017-03-15 SURGICAL SUPPLY — 11 items

## 2017-03-15 NOTE — Plan of Care (Signed)
Problem: Education: Goal: Knowledge of [facility] Education information/materials will improve Outcome: Progressing Pt's knowledge of disease process will improve prior to discharge.  Problem: Pain Managment: Goal: General experience of comfort will improve Outcome: Progressing Pt's pain will be medically managed prior to discharge.

## 2017-03-15 NOTE — H&P (View-Only) (Signed)
 Progress Note  Patient Name: Sharon Gilmore Date of Encounter: 03/15/2017  Primary Cardiologist: Allred  Subjective   82 y.o. female with recent PPM placement on 7/24   Developed SOB a couple days later  Echo:  LVEF normal  Triv effusion  LE doppler neg for DVT (sl swelling).  Admitted on 7/28.    Slept well and breathing is better without any significant chest pains overnight. Her CT angio is negative and she is to go for left heart cath today.  Peak troponin 1.57.  She is overall appearing to do well at this time.   Blood pressure (!) 170/61, pulse 65, temperature 98.2 F (36.8 C), temperature source Oral, resp. rate 18, height 5' 4" (1.626 m), weight 189 lb 6.4 oz (85.9 kg), SpO2 95 %.   Inpatient Medications    Scheduled Meds: . aspirin EC  81 mg Oral QHS  . enalapril  20 mg Oral Daily  . gabapentin  300 mg Oral QHS  . hydrochlorothiazide  25 mg Oral Daily  . metoprolol tartrate  25 mg Oral QID  . multivitamin  1 tablet Oral BID  . OXcarbazepine  150 mg Oral QPM  . sodium chloride flush  3 mL Intravenous Q12H   Continuous Infusions: . sodium chloride    . sodium chloride 50 mL/hr at 03/14/17 2252  . heparin 1,200 Units/hr (03/15/17 0628)   PRN Meds: sodium chloride, acetaminophen **OR** acetaminophen, hydrALAZINE, ondansetron **OR** ondansetron (ZOFRAN) IV, sodium chloride flush, traMADol   Vital Signs    Vitals:   03/14/17 1440 03/14/17 1952 03/14/17 2056 03/15/17 0653  BP: (!) 160/66 (!) 189/72 (!) 158/74 (!) 170/61  Pulse: 76 77 72 65  Resp: 18 17 17 18  Temp: 97.7 F (36.5 C) 98.2 F (36.8 C) 98.6 F (37 C) 98.2 F (36.8 C)  TempSrc: Oral Oral Oral Oral  SpO2: 98% 95% 95% 95%  Weight:      Height:        Intake/Output Summary (Last 24 hours) at 03/15/17 0835 Last data filed at 03/15/17 0650  Gross per 24 hour  Intake              650 ml  Output              700 ml  Net              -50 ml   Filed Weights   03/13/17 0606 03/14/17 0640    Weight: 190 lb 1.6 oz (86.2 kg) 189 lb 6.4 oz (85.9 kg)    Telemetry    SR/ST - Personally Reviewed   Physical Exam   GEN:No acute distress.   Neck:No JVD Cardiac:RRR, no murmurs, rubs, or gallops.  Respiratory:Rales at R base   GI:Soft, nontender, non-distended  MS:No edema; No deformity. Neuro:Nonfocal  Psych: Normal affect   Labs    Chemistry Recent Labs Lab 03/08/17 1605 03/08/17 1632 03/12/17 2331 03/13/17 0539  NA 135 136 133* 133*  K 3.7 3.8 3.7 3.7  CL 102 100* 99* 101  CO2 24  --  25 25  GLUCOSE 107* 103* 104* 124*  BUN 11 11 5* <5*  CREATININE 0.55 0.50 0.52 0.54  CALCIUM 9.1  --  8.5* 8.7*  PROT  --   --  5.8* 6.5  ALBUMIN  --   --  3.0* 3.1*  AST  --   --  32 40  ALT  --   --  36 39  ALKPHOS  --   --    88 90  BILITOT  --   --  0.4 0.5  GFRNONAA >60  --  >60 >60  GFRAA >60  --  >60 >60  ANIONGAP 9  --  9 7     Hematology Recent Labs Lab 03/13/17 0539 03/14/17 0520 03/15/17 0433  WBC 10.7* 8.3 8.2  RBC 4.41 4.06 4.01  HGB 12.5 11.6* 11.1*  HCT 37.9 34.8* 34.4*  MCV 85.9 85.7 85.8  MCH 28.3 28.6 27.7  MCHC 33.0 33.3 32.3  RDW 15.0 15.3 15.3  PLT 244 239 227    Cardiac Enzymes Recent Labs Lab 03/13/17 0230 03/13/17 0539 03/13/17 0846 03/13/17 1759  TROPONINI 0.11* 0.39* 0.92* 1.57*    Recent Labs Lab 03/08/17 1627 03/13/17 0028  TROPIPOC 0.04 0.10*     BNP Recent Labs Lab 03/12/17 2331  BNP 161.4*     DDimer  Recent Labs Lab 03/13/17 2253  DDIMER 1.96*     Radiology    Ct Angio Chest Pe W Or Wo Contrast  Result Date: 03/14/2017 CLINICAL DATA:  Elevated D-dimer. Pacemaker placed on Tuesday. Shortness of breath. EXAM: CT ANGIOGRAPHY CHEST WITH CONTRAST TECHNIQUE: Multidetector CT imaging of the chest was performed using the standard protocol during bolus administration of intravenous contrast. Multiplanar CT image reconstructions and MIPs were obtained to evaluate the vascular anatomy. CONTRAST:  100 cc  Isovue 370 intravenously. COMPARISON:  Chest radiograph 03/13/2017 FINDINGS: Cardiovascular: Satisfactory opacification of the pulmonary arteries to the segmental level. No evidence of pulmonary embolism. Normal heart size. No pericardial effusion. Dual lead cardiac pacemaker with leads within the right atrium and right ventricle. Mild calcific atherosclerotic disease of the coronary arteries. Prominence of the ascending aorta measuring 3.9 cm. Mediastinum/Nodes: No enlarged mediastinal, hilar, or axillary lymph nodes. Thyroid gland, trachea, and esophagus demonstrate no significant findings. Lungs/Pleura: Minimal pleural effusions. Subsegmental atelectasis in the left lung base. Segmental atelectasis in the right lung base. Mild interstitial pulmonary edema. Upper Abdomen: No acute abnormality. Musculoskeletal: No chest wall abnormality. No acute or significant osseous findings. Review of the MIP images confirms the above findings. IMPRESSION: No evidence pulmonary embolus. Ascending aorta measures 3.9 cm, upper limits of normal. Mild calcific atherosclerotic disease of the coronary arteries. Mild interstitial pulmonary edema with minimal bilateral pleural effusions. Segmental atelectasis of the right lower lobe and subsegmental atelectasis of left lower lobe. Electronically Signed   By: Dobrinka  Dimitrova M.D.   On: 03/14/2017 18:39    Cardiac Studies   Cardiac catheterization pending for this admission  Transthoracic Echocardiogram 03/13/2017 Study Conclusions  - Left ventricle: The cavity size was normal. Wall thickness was   increased in a pattern of mild LVH. Systolic function was normal.   The estimated ejection fraction was in the range of 55% to 60%.   Doppler parameters are consistent with both elevated ventricular   end-diastolic filling pressure and elevated left atrial filling   pressure. - Aortic valve: Valve area (Vmax): 2.59 cm^2. - Mitral valve: Valve area by continuity equation  (using LVOT   flow): 2.07 cm^2. - Left atrium: The atrium was mildly dilated. - Atrial septum: No defect or patent foramen ovale was identified. - Pericardium, extracardiac: A trivial pericardial effusion was   identified posterior to the heart.  Patient Profile     82 y.o. female with recent PPM placement on 7/24   Developed SOB a couple days later  Echo:  LVEF normal  Triv effusion  LE doppler neg for DVT (sl swelling).  Admitted on 7/28.      Assessment & Plan    1. Elevated troponin and D-dimer: CT ango of the chest resulted negative for PE. She continues on heparin. I discussed need for L heart cath today. She is agreeable and wishes to proceed.   The patient understands that risks included but are not limited to stroke (1 in 1000), death (1 in 1000), kidney failure [usually temporary] (1 in 500), bleeding (1 in 200), allergic reaction [possibly serious] (1 in 200).   2. PPM: Interrogated and functioning normal with no arrythmias  Signed, GREENE,TIFFANY G, PA-C  03/15/2017, 8:35 AM    The patient was seen, examined and discussed with Tiffany Greene, PA-C and I agree with the above.    82 y.o. female with recent PPM placement on 7/24, with worsening SOB, elevated troponin still uptrending today 1.57, crea 0.54, scheduled for a left heart cath. A nuclear stress test  In 07/2016 was negative for ischemia.  BP is elevated, I will add amlodipine 2.5 mg po daily. On heparin, ASA, I will add atorvastatin 20 mg po daily.   Keenan Dimitrov, MD 03/15/2017  

## 2017-03-15 NOTE — Progress Notes (Signed)
PROGRESS NOTE    Sharon Gilmore  ZOX:[id number] DOB: [birth year] DOA: 03/12/2017 PCP: Olivia Canter, MD   Brief Narrative: Sharon Gilmore is a 83 y.o. female with a history of hypertension, status post pacemaker for heart block. Patient presented with hypertensive emergency significant for blood pressures with systolic values in 200s and associated elevated troponin. Cardiology consulted. D-dimer elevated cell CTA is pending.   Assessment & Plan:   Principal Problem:   Hypertensive urgency Active Problems:   Spinal stenosis   History of asthma   SOB (shortness of breath)   Elevated troponin   Hypertensive emergency Evidence of end organ damage with elevated troponin. Possible PE. Wells score of 0 but d-dimer elevated. CTA negative for PE. -cardiology recommendations: metoprolol QID -continue enalapril, amlodipine -continue hydralazine prn  Elevated troponin Paired with exertional dyspnea, concern for ACS. Recent stress test from 2017 was normal. Trended down -Cardiology recommendations: heart cath  Spinal stenosis -continue Trileptal and Neurontin  Second degree heart block Patient is status post pacer placement a few days ago. Pacer interrogated per cardiology and no arrhythmias noted.   DVT prophylaxis: heparin drip Code Status: full code Family Communication: none at bedside Disposition Plan: pending cardiac cath   Consultants:   cardiology  Procedures:   Echocardiogram (7/28): EF of 55% to 60% with evidence of LVH, mildly dilated left atrium, trivial pericardial effusion  Antimicrobials:  none    Subjective: No dyspnea or chest pain  Objective: Vitals:   03/15/17 1319 03/15/17 1329 03/15/17 1330 03/15/17 1523  BP: (!) 186/75 (!) 185/80 (!) 175/80 (!) 166/75  Pulse: 63   63  Resp:    16  Temp:      TempSrc:      SpO2:    95%  Weight:      Height:        Intake/Output Summary (Last 24 hours) at 03/15/17 1625 Last data filed at  03/15/17 1528  Gross per 24 hour  Intake          1101.63 ml  Output              800 ml  Net           301.63 ml   Filed Weights   03/13/17 0606 03/14/17 0640  Weight: 86.2 kg (190 lb 1.6 oz) 85.9 kg (189 lb 6.4 oz)    Examination:  General exam: Appears calm and comfortable Respiratory system: Clear to auscultation. Respiratory effort normal. Cardiovascular system: Regular rate and rhythm. Normal S1 and S2. No heart murmurs present. No extra heart sounds Gastrointestinal system: Soft, non-tender, non-distended, no guarding, no rebound, no masses felt Central nervous system: Alert and oriented. No focal neurological deficits. Extremities: No edema. No calf tenderness Skin: No cyanosis. No rashes Psychiatry: Judgement and insight appear normal. Mood & affect appropriate.     Data Reviewed: I have personally reviewed following labs and imaging studies  CBC:  Recent Labs Lab 03/09/17 0502 03/12/17 2331 03/13/17 0539 03/14/17 0520 03/15/17 0433  WBC 7.7 7.4 10.7* 8.3 8.2  NEUTROABS 4.8 4.5  --   --   --   HGB 11.2* 10.8* 12.5 11.6* 11.1*  HCT 33.9* 32.5* 37.9 34.8* 34.4*  MCV 87.6 86.7 85.9 85.7 85.8  PLT 258 215 244 239 227   Basic Metabolic Panel:  Recent Labs Lab 03/08/17 1632 03/09/17 0502 03/12/17 2331 03/13/17 0539  NA 136  --  133* 133*  K 3.8  --  3.7 3.7  CL  100*  --  99* 101  CO2  --   --  25 25  GLUCOSE 103*  --  104* 124*  BUN 11  --  5* <5*  CREATININE 0.50  --  0.52 0.54  CALCIUM  --   --  8.5* 8.7*  MG  --  2.0  --  2.0   GFR: Estimated Creatinine Clearance: 59.5 mL/min (by C-G formula based on SCr of 0.54 mg/dL). Liver Function Tests:  Recent Labs Lab 03/12/17 2331 03/13/17 0539  AST 32 40  ALT 36 39  ALKPHOS 88 90  BILITOT 0.4 0.5  PROT 5.8* 6.5  ALBUMIN 3.0* 3.1*   No results for input(s): LIPASE, AMYLASE in the last 168 hours. No results for input(s): AMMONIA in the last 168 hours. Coagulation Profile:  Recent Labs Lab  03/09/17 0502  INR 1.27   Cardiac Enzymes:  Recent Labs Lab 03/13/17 0230 03/13/17 0539 03/13/17 0846 03/13/17 1759 03/15/17 0856  TROPONINI 0.11* 0.39* 0.92* 1.57* 0.45*   BNP (last 3 results) No results for input(s): PROBNP in the last 8760 hours. HbA1C: No results for input(s): HGBA1C in the last 72 hours. CBG: No results for input(s): GLUCAP in the last 168 hours. Lipid Profile: No results for input(s): CHOL, HDL, LDLCALC, TRIG, CHOLHDL, LDLDIRECT in the last 72 hours. Thyroid Function Tests:  Recent Labs  03/13/17 0539  TSH 2.010   Anemia Panel: No results for input(s): VITAMINB12, FOLATE, FERRITIN, TIBC, IRON, RETICCTPCT in the last 72 hours. Sepsis Labs: No results for input(s): PROCALCITON, LATICACIDVEN in the last 168 hours.  Recent Results (from the past 240 hour(s))  MRSA PCR Screening     Status: None   Collection Time: 03/08/17  9:08 PM  Result Value Ref Range Status   MRSA by PCR NEGATIVE NEGATIVE Final    Comment:        The GeneXpert MRSA Assay (FDA approved for NASAL specimens only), is one component of a comprehensive MRSA colonization surveillance program. It is not intended to diagnose MRSA infection nor to guide or monitor treatment for MRSA infections.          Radiology Studies: Ct Angio Chest Pe W Or Wo Contrast  Result Date: 03/14/2017 CLINICAL DATA:  Elevated D-dimer. Pacemaker placed on Tuesday. Shortness of breath. EXAM: CT ANGIOGRAPHY CHEST WITH CONTRAST TECHNIQUE: Multidetector CT imaging of the chest was performed using the standard protocol during bolus administration of intravenous contrast. Multiplanar CT image reconstructions and MIPs were obtained to evaluate the vascular anatomy. CONTRAST:  100 cc Isovue 370 intravenously. COMPARISON:  Chest radiograph 03/13/2017 FINDINGS: Cardiovascular: Satisfactory opacification of the pulmonary arteries to the segmental level. No evidence of pulmonary embolism. Normal heart size. No  pericardial effusion. Dual lead cardiac pacemaker with leads within the right atrium and right ventricle. Mild calcific atherosclerotic disease of the coronary arteries. Prominence of the ascending aorta measuring 3.9 cm. Mediastinum/Nodes: No enlarged mediastinal, hilar, or axillary lymph nodes. Thyroid gland, trachea, and esophagus demonstrate no significant findings. Lungs/Pleura: Minimal pleural effusions. Subsegmental atelectasis in the left lung base. Segmental atelectasis in the right lung base. Mild interstitial pulmonary edema. Upper Abdomen: No acute abnormality. Musculoskeletal: No chest wall abnormality. No acute or significant osseous findings. Review of the MIP images confirms the above findings. IMPRESSION: No evidence pulmonary embolus. Ascending aorta measures 3.9 cm, upper limits of normal. Mild calcific atherosclerotic disease of the coronary arteries. Mild interstitial pulmonary edema with minimal bilateral pleural effusions. Segmental atelectasis of the right lower lobe  and subsegmental atelectasis of left lower lobe. Electronically Signed   By: Ted Mcalpineobrinka  Dimitrova M.D.   On: 03/14/2017 18:39        Scheduled Meds: . [MAR Hold] amLODipine  2.5 mg Oral Daily  . [MAR Hold] aspirin EC  81 mg Oral QHS  . [MAR Hold] atorvastatin  20 mg Oral q1800  . [MAR Hold] enalapril  20 mg Oral Daily  . [MAR Hold] gabapentin  300 mg Oral QHS  . [MAR Hold] hydrochlorothiazide  25 mg Oral Daily  . [MAR Hold] LORazepam  1 mg Intravenous Once  . [MAR Hold] metoprolol tartrate  25 mg Oral QID  . [MAR Hold] multivitamin  1 tablet Oral BID  . [MAR Hold] OXcarbazepine  150 mg Oral QPM  . sodium chloride flush  3 mL Intravenous Q12H   Continuous Infusions: . sodium chloride    . sodium chloride 50 mL/hr at 03/14/17 2252  . heparin 1,200 Units/hr (03/15/17 0628)  . heparin       LOS: 1 day     Jacquelin Hawkingalph Nettey, MD [facility] 03/15/2017, 4:25 PM Pager: [phone]  If 7PM-7AM,  please contact night-coverage [email or web address] Password [email or web address] 03/15/2017, 4:25 PM

## 2017-03-15 NOTE — H&P (Signed)
Patient ID:   [id number] Patient: Sharon Gilmore  Date of Birth: 23-Apr-1934 Visit Type: Office Visit   Date: 03/08/2017 01:00 PM Provider: Danae OrleansJoseph D. Sharon MaxonStern Gilmore   This 83 year old female presents for pain and numbness.   History of Present Illness: 1.  pain  2.  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  [facility]ic for left upper quadrant pain and left lumbar pain March of this year.  Abdominal pain has resolved.  Left lumbar and left leg pain persist.  She does note some persistent mid day nausea. Patient reports injection decreased pain. However, she also reports numbness along the left leg through the big toe along with occasional "electrical shocks" down the leg following injections. She also notes increased shortness of breath and decreased HR.  02/04/2017 left L5-S1 transforaminal ESI 02/25/2017 left L4-5 transforaminal ESI  History:  HTN, seizure(80's), psoriasis, asthma, carpal tunnel syndrome, vitamin-D deficiency, murmur Surgical history:  Cholecystectomy 2004, incisional hernia repair, tonsillectomy  Gabapentin 300 milligrams q.h.s. Oxycarbazapine 20 milligrams q.h.s.   Thoracic and lumbar MRIs on [facility].  Lumbar plain films were performed at [facility].         Medical/Surgical/Interim History Reviewed, no change.     Family History: Reviewed, no changes.    Social History: Reviewed, no changes.   MEDICATIONS(added, continued or stopped this visit): Started Medication Directions Instruction Stopped   aspirin 81 mg tablet,delayed release take 1 tablet by oral route  every day     enalapril maleate 20 mg tablet take 1 tablet by oral route  every day     gabapentin 300 mg capsule take 1 capsule by oral route at bedtime     hydrochlorothiazide 12.5 mg tablet take 1 tablet by oral route  every day     oxcarbazepine 150 mg tablet take 1 tablet by oral route 2-3  times every day      PreserVision AREDS 2 250 mg-200 unit-40 mg-1 mg capsule      Vitamin D3 2,000 unit capsule        ALLERGIES: Ingredient Reaction Medication Name Comment  CODEINE   "Nausea & Vomiting"  ACETAMINOPHEN  Percocet "Nausea & Vomiting"  PENICILLINS   "Rash"  OXYCODONE HCL  Percocet "Nausea & Vomiting"  DULOXETINE HCL  Cymbalta "Nausea & Groggy"  CELECOXIB  Celebrex "Legs ache"      Vitals Date Temp F BP Pulse Ht In Wt Lb BMI BSA Pain Score  03/08/2017  178/67 48 64 187.4 32.17  0/10      IMPRESSION Patient presents with left leg pain and weakness with foot drop. Pain was slightly alleviated with injections. On confrontational testing, edema in the left leg, unable to pick toes or foot up, 20 degree SLR on the left pain to the foot and ankle, weakness in left lower extremity. Patient notes previous bone density test was normal. X-ray shows listhesis of L4 on L5 9 mm on neutral, 8 mm on flexion, and 6.8 mm on extension. Sever spinal stenosis at L2-3, and L3-4. Advised patient to be seen by her PCP for leg edema and shortness of breath. Schedule patient for decompression and fusion of L2-5.  Completed Orders (this encounter) Order Details Reason Side Interpretation Result Initial Treatment Date Region  Lumbar Spine- AP/Lat/Flex/Ex      03/08/2017   Scoliosis- AP/Lat      03/08/2017    Assessment/Plan # Detail Type Description   1. Assessment  Spinal stenosis, lumbar region without neurogenic claud (M48.061).           Pain Management Plan Pain Scale: 0/10. Method: Numeric Pain Intensity Scale. Location: lower back and right leg.  Advised patient to be seen by her PCP for left leg edema and shortness of breath. Schedule patient for decompression and fusion of L2-5. Nurse education given.  Orders: Diagnostic Procedures: Assessment Procedure  M48.061 Lumbar Spine- AP/Lat/Flex/Ex  Z61.096 Scoliosis- AP/Lat             Provider:  Venetia Maxon Gilmore, Sharon Gilmore 03/08/2017 2:32 PM   Dictation edited by: Sharon Gilmore    CC Providers: Sharon Gilmore [facility] [address] [place],  [place]  [address]   Sharon Gilmore  [address] [place], [place] [address]-              Electronically signed by Sharon Gilmore Sharon Maxon Gilmore on 03/14/2017 03:33 PM  Patient ID:   [id number] Patient: Sharon Gilmore  Date of Birth: 1934/03/02 Visit Type: Office Visit   Date: 02/03/2017 01:15 PM Provider: Gwynne Edinger PhD Gilmore   This 83 year old female presents for pain.   History of Present Illness: 1.  pain  Extraordinarily present, 49 issues, 83 year old right-handed woman, referred to me by Sharon Gilmore for evaluation and treatment of left lower extremity radicular pain.  [name] is a retired Engineer, civil (consulting), who noticed that she woke up on May 14th with acute onset of left leg pain, and some back pain.  She went saw Sharon Gilmore, who treated her with some gabapentin ordered MRI.  Follow-up from her MRI demonstrated some multilevel degenerative change,and stenosis.  Patient was continuing to be significantly debilitated, and was referred for further evaluation and treatment.  On presentation today patient describes sharp, achy pain in her left low back, with shooting, electric or knife-like sensations into the median anterior thigh, but also more electrical, numb or dysesthetic sensations along the buttock, posterior thigh posterior calf and into the lateral 3 toes.  She has not noticed any overt weakness per se, but after her exam (see below), she notices that over the last week or so she does seem to be tripping over her toes more often, or having a near "footdrop".  She denies give-way or other weakness.  She has had no changes in her bowel or bladder function.          PAST MEDICAL HISTORY, SURGICAL HISTORY, FAMILY HISTORY, SOCIAL HISTORY AND REVIEW OF SYSTEMS I have reviewed the patient's past medical, surgical, family and social  history as well as the comprehensive review of systems as included on the [facility] history form dated 02/03/2017, which I have signed.   MEDICATIONS(added, continued or stopped this visit): Started Medication Directions Instruction Stopped   aspirin 81 mg tablet,delayed release take 1 tablet by oral route  every day     enalapril maleate 20 mg tablet take 1 tablet by oral route  every day     gabapentin 300 mg capsule take 1 capsule by oral route at bedtime     hydrochlorothiazide 12.5 mg tablet take 1 tablet by oral route  every day     oxcarbazepine 150 mg tablet take 1 tablet by oral route 2-3  times every day     PreserVision AREDS 2 250 mg-200 unit-40 mg-1 mg capsule      Vitamin D3 2,000 unit capsule  ALLERGIES: Ingredient Reaction Medication Name Comment  CODEINE   "Nausea & Vomiting"  ACETAMINOPHEN  Percocet "Nausea & Vomiting"  OXYCODONE HCL  Percocet "Nausea & Vomiting"  DULOXETINE HCL  Cymbalta "Nausea & Groggy"  CELECOXIB  Celebrex "Legs ache"  PENICILLINS   "Rash"   Reviewed, updated.   Review of Systems System Neg/Pos Details  Constitutional Positive Fatigue.  Constitutional Negative Chills, Fever, Malaise, Night sweats, Weight gain and Weight loss.  ENMT Negative Ear drainage, Hearing loss, Nasal drainage, Otalgia, Sinus pressure and Sore throat.  Eyes Negative Eye discharge, Eye pain and Vision changes.  Respiratory Negative Chronic cough, Cough, Dyspnea, Known TB exposure and Wheezing.  Cardio Negative Chest pain, Claudication, Edema and Irregular heartbeat/palpitations.  GI Negative Abdominal pain, Blood in stool, Change in stool pattern, Constipation, Decreased appetite, Diarrhea, Heartburn, Nausea and Vomiting.  GU Negative Dysuria, Hematuria, Polyuria (Genitourinary), Urinary frequency, Urinary incontinence and Urinary retention.  Endocrine Negative Cold intolerance, Heat intolerance, Polydipsia and Polyphagia.  Neuro  Positive Gait disturbance.  Neuro Negative Dizziness, Extremity weakness, Headache, Memory impairment, Numbness in extremity, Seizures and Tremors.  Psych Negative Anxiety, Depression and Insomnia.  Integumentary Negative Brittle hair, Brittle nails, Change in shape/size of mole(s), Hair loss, Hirsutism, Hives, Pruritus, Rash and Skin lesion.  MS Positive Joint pain.  MS Negative Back pain, Joint swelling, Muscle weakness and Neck pain.  Hema/Lymph Negative Easy bleeding, Easy bruising and Lymphadenopathy.  Allergic/Immuno Negative Contact allergy, Environmental allergies, Food allergies and Seasonal allergies.  Reproductive Negative Breast discharge and Breast lumps.   Vitals Date Temp F BP Pulse Ht In Wt Lb BMI BSA Pain Score  02/03/2017  132/82 88 64 182.2 31.27  8/10     PHYSICAL EXAM General Level of Distress: no acute distress Overall Appearance: Normal    Cardiovascular Cardiac: normal  Respiratory Lungs: non-labored  Neurological Orientation: normal Recent and Remote Memory: normal Attention Span and Concentration:   normal Language: Fluent, appropriate Fund of Knowledge: normal  Right Left Sensation: normal normal Upper Extremity Coordination: normal normal  Lower Extremity Coordination: normal normal  Musculoskeletal Gait and Station: antalgic gait, limps L   Motor Strength Lower extremity motor strength was tested in the clinically pertinent muscles. Any abnormal findings will be noted below.   Right Left Deltoid: 5/5 5/5 Biceps: 5/5 5/5 Triceps: 5/5 5/5 Infraspinatus: 5/5 5/5 Wrist Extensor: 5/5 5/5 Grip: 5/5 5/5 Finger Extensor: 5/5 5/5 Hip Flexor: 5/5 4+/5 Knee Extensor: 5/5 5/5 Tib Anterior: 5/5 5/5 EHL: 5/5 3/5 Medial Gastroc: 5/5 5/5   Deep Tendon Reflexes  Right Left Patellar: normal normal Achilles: normal decrease (near absent)  Sensory Sensation was tested at C2 to T1 and L1 to S1.   Cranial Nerves II. Optic Nerve/Visual  Fields: bilateral normal III. Oculomotor: bilateral EOMI IV. Trochlear: bilateral EOMI V. Trigeminal: bilateral Facial sensation intact, symetric temporalis/masseter strength VI. Abducens: bilateral EOMI VII. Facial: bilateral normal VIII. Acoustic/Vestibular: bilateral Intact to finger rub IX. Glossopharyngeal: bilateral Palate elevates symetrically X. Vagus: bilateral Uvula midline XI. Spinal Accessory: bilateral Normal shoulder shrug XII. Hypoglossal: bilateral Tongue midline  Motor and other Tests Pronator drift: absent     Right Left Hoffman's: absent absent     DIAGNOSTIC RESULTS MRI of the lumbar spine, without contrast, obtained April to see her.  Independent review: Five lumbar vertebra, without bony lesion, or significant abnormality. Degenerative disc change at L2-3, L3-4, L4-5, L5-S1.  Trefoil severe stenosis at L2-3, left recess slightly worse than right; mild to moderate stenosis at L3-4, moderate  at L4-5.     IMPRESSION Patient presents with a clinical picture indicative of an L5 radiculopathy, some EHL weakness, and potential S1 involvement with a 1 to 2+ Achilles reflex on the right, 0 on the left.  She has findings on her MRI concerning for stenosis at L2-3, which likely gives her her anterior thigh radicular symptoms.  Assessment/Plan # Detail Type Description   1. Assessment Spinal stenosis of lumbar region, unspecified whether neurogenic claudication present (M48.061).       2. Assessment Lumbosacral radiculopathy at L5 (M54.17).   Plan Orders - L5-S1 - Lumbar Spine - Transforaminal - left.           Reviewed the patient's MRI with her today, my clinical findings, made the following recommendations: We will try and direct therapy to the L5 nerve root, where she demonstrates neurologic changes.  Purpose of set injection would be to try and reverse the neurologic findings, as well as some pain.  If in fact we Gilmore accomplish this, would be surprised if  he have a more isolated L2-3 level radiculopathy as well.  Given the nature of her stenosis that level, I would be more inclined to treat that with the nonspecific interlaminar L2-3 epidural steroid injection.  If she we see no reversal of her neurologic exam findings, consider neurosurgical consultation.  I have  reviewed my findings today, and the MRI with Dr. Venetia Gilmore, who was available.  Orders: Office Procedures/Services: Assessment Service Comments  M54.17  - L5-S1 - Lumbar Spine - Transforaminal - left               Provider:  Ollen Bowl PhD Gilmore, Vic Ripper 02/03/2017 2:37 PM  Dictation edited by: Sharon Gilmore    CC Providers: Sharon Gilmore [facility] [address] [place],  [place]  [address]   Sharon Gilmore  [address] [place], [place] [address]-              Electronically signed by Sharon Edinger PhD Gilmore on 02/03/2017 02:37 PM

## 2017-03-15 NOTE — Interval H&P Note (Signed)
Cath Lab Visit (complete for each Cath Lab visit)  Clinical Evaluation Leading to the Procedure:   ACS: Yes.    Non-ACS:    Anginal Classification: CCS III  Anti-ischemic medical therapy: No Therapy  Non-Invasive Test Results: No non-invasive testing performed  Prior CABG: No previous CABG      History and Physical Interval Note:  03/15/2017 4:03 PM  Sharon Gilmore  has presented today for surgery, with the diagnosis of cp  The various methods of treatment have been discussed with the patient and family. After consideration of risks, benefits and other options for treatment, the patient has consented to  Procedure(s): Left Heart Cath and Coronary Angiography (N/A) as a surgical intervention .  The patient's history has been reviewed, patient examined, no change in status, stable for surgery.  I have reviewed the patient's chart and labs.  Questions were answered to the patient's satisfaction.     Nanetta BattyBerry, Donathan Buller

## 2017-03-15 NOTE — Progress Notes (Addendum)
Progress Note  Patient Name: Sharon Gilmore Date of Encounter: 03/15/2017  Primary Cardiologist: Allred  Subjective   83 y.o. female with recent PPM placement on 7/24   Developed SOB a couple days later  Echo:  LVEF normal  Triv effusion  LE doppler neg for DVT (sl swelling).  Admitted on 7/28.    Slept well and breathing is better without any significant chest pains overnight. Her CT angio is negative and she is to go for left heart cath today.  Peak troponin 1.57.  She is overall appearing to do well at this time.   Blood pressure (!) 170/61, pulse 65, temperature 98.2 F (36.8 C), temperature source Oral, resp. rate 18, height 5\' 4"  (1.626 m), weight 189 lb 6.4 oz (85.9 kg), SpO2 95 %.   Inpatient Medications    Scheduled Meds: . aspirin EC  81 mg Oral QHS  . enalapril  20 mg Oral Daily  . gabapentin  300 mg Oral QHS  . hydrochlorothiazide  25 mg Oral Daily  . metoprolol tartrate  25 mg Oral QID  . multivitamin  1 tablet Oral BID  . OXcarbazepine  150 mg Oral QPM  . sodium chloride flush  3 mL Intravenous Q12H   Continuous Infusions: . sodium chloride    . sodium chloride 50 mL/hr at 03/14/17 2252  . heparin 1,200 Units/hr (03/15/17 0628)   PRN Meds: sodium chloride, acetaminophen **OR** acetaminophen, hydrALAZINE, ondansetron **OR** ondansetron (ZOFRAN) IV, sodium chloride flush, traMADol   Vital Signs    Vitals:   03/14/17 1440 03/14/17 1952 03/14/17 2056 03/15/17 0653  BP: (!) 160/66 (!) 189/72 (!) 158/74 (!) 170/61  Pulse: 76 77 72 65  Resp: 18 17 17 18   Temp: 97.7 F (36.5 C) 98.2 F (36.8 C) 98.6 F (37 C) 98.2 F (36.8 C)  TempSrc: Oral Oral Oral Oral  SpO2: 98% 95% 95% 95%  Weight:      Height:        Intake/Output Summary (Last 24 hours) at 03/15/17 0835 Last data filed at 03/15/17 0650  Gross per 24 hour  Intake              650 ml  Output              700 ml  Net              -50 ml   Filed Weights   03/13/17 0606 03/14/17 0640    Weight: 190 lb 1.6 oz (86.2 kg) 189 lb 6.4 oz (85.9 kg)    Telemetry    SR/ST - Personally Reviewed   Physical Exam   GEN:No acute distress.   Neck:No JVD Cardiac:RRR, no murmurs, rubs, or gallops.  Respiratory:Rales at R base   JX:BJYNGI:Soft, nontender, non-distended  MS:No edema; No deformity. Neuro:Nonfocal  Psych: Normal affect   Labs    Chemistry Recent Labs Lab 03/08/17 1605 03/08/17 1632 03/12/17 2331 03/13/17 0539  NA 135 136 133* 133*  K 3.7 3.8 3.7 3.7  CL 102 100* 99* 101  CO2 24  --  25 25  GLUCOSE 107* 103* 104* 124*  BUN 11 11 5* <5*  CREATININE 0.55 0.50 0.52 0.54  CALCIUM 9.1  --  8.5* 8.7*  PROT  --   --  5.8* 6.5  ALBUMIN  --   --  3.0* 3.1*  AST  --   --  32 40  ALT  --   --  36 39  ALKPHOS  --   --  88 90  BILITOT  --   --  0.4 0.5  GFRNONAA >60  --  >60 >60  GFRAA >60  --  >60 >60  ANIONGAP 9  --  9 7     Hematology Recent Labs Lab 03/13/17 0539 03/14/17 0520 03/15/17 0433  WBC 10.7* 8.3 8.2  RBC 4.41 4.06 4.01  HGB 12.5 11.6* 11.1*  HCT 37.9 34.8* 34.4*  MCV 85.9 85.7 85.8  MCH 28.3 28.6 27.7  MCHC 33.0 33.3 32.3  RDW 15.0 15.3 15.3  PLT 244 239 227    Cardiac Enzymes Recent Labs Lab 03/13/17 0230 03/13/17 0539 03/13/17 0846 03/13/17 1759  TROPONINI 0.11* 0.39* 0.92* 1.57*    Recent Labs Lab 03/08/17 1627 03/13/17 0028  TROPIPOC 0.04 0.10*     BNP Recent Labs Lab 03/12/17 2331  BNP 161.4*     DDimer  Recent Labs Lab 03/13/17 2253  DDIMER 1.96*     Radiology    Ct Angio Chest Pe W Or Wo Contrast  Result Date: 03/14/2017 CLINICAL DATA:  Elevated D-dimer. Pacemaker placed on Tuesday. Shortness of breath. EXAM: CT ANGIOGRAPHY CHEST WITH CONTRAST TECHNIQUE: Multidetector CT imaging of the chest was performed using the standard protocol during bolus administration of intravenous contrast. Multiplanar CT image reconstructions and MIPs were obtained to evaluate the vascular anatomy. CONTRAST:  100 cc  Isovue 370 intravenously. COMPARISON:  Chest radiograph 03/13/2017 FINDINGS: Cardiovascular: Satisfactory opacification of the pulmonary arteries to the segmental level. No evidence of pulmonary embolism. Normal heart size. No pericardial effusion. Dual lead cardiac pacemaker with leads within the right atrium and right ventricle. Mild calcific atherosclerotic disease of the coronary arteries. Prominence of the ascending aorta measuring 3.9 cm. Mediastinum/Nodes: No enlarged mediastinal, hilar, or axillary lymph nodes. Thyroid gland, trachea, and esophagus demonstrate no significant findings. Lungs/Pleura: Minimal pleural effusions. Subsegmental atelectasis in the left lung base. Segmental atelectasis in the right lung base. Mild interstitial pulmonary edema. Upper Abdomen: No acute abnormality. Musculoskeletal: No chest wall abnormality. No acute or significant osseous findings. Review of the MIP images confirms the above findings. IMPRESSION: No evidence pulmonary embolus. Ascending aorta measures 3.9 cm, upper limits of normal. Mild calcific atherosclerotic disease of the coronary arteries. Mild interstitial pulmonary edema with minimal bilateral pleural effusions. Segmental atelectasis of the right lower lobe and subsegmental atelectasis of left lower lobe. Electronically Signed   By: Ted Mcalpineobrinka  Dimitrova M.D.   On: 03/14/2017 18:39    Cardiac Studies   Cardiac catheterization pending for this admission  Transthoracic Echocardiogram 03/13/2017 Study Conclusions  - Left ventricle: The cavity size was normal. Wall thickness was   increased in a pattern of mild LVH. Systolic function was normal.   The estimated ejection fraction was in the range of 55% to 60%.   Doppler parameters are consistent with both elevated ventricular   end-diastolic filling pressure and elevated left atrial filling   pressure. - Aortic valve: Valve area (Vmax): 2.59 cm^2. - Mitral valve: Valve area by continuity equation  (using LVOT   flow): 2.07 cm^2. - Left atrium: The atrium was mildly dilated. - Atrial septum: No defect or patent foramen ovale was identified. - Pericardium, extracardiac: A trivial pericardial effusion was   identified posterior to the heart.  Patient Profile     83 y.o. female with recent PPM placement on 7/24   Developed SOB a couple days later  Echo:  LVEF normal  Triv effusion  LE doppler neg for DVT (sl swelling).  Admitted on 7/28.  Assessment & Plan    1. Elevated troponin and D-dimer: CT ango of the chest resulted negative for PE. She continues on heparin. I discussed need for L heart cath today. She is agreeable and wishes to proceed.   The patient understands that risks included but are not limited to stroke (1 in 1000), death (1 in 1000), kidney failure [usually temporary] (1 in 500), bleeding (1 in 200), allergic reaction [possibly serious] (1 in 200).   2. PPM: Interrogated and functioning normal with no arrythmias  Signed, Dorthula Matas, PA-C  03/15/2017, 8:35 AM    The patient was seen, examined and discussed with Marlon Pel, PA-C and I agree with the above.    83 y.o. female with recent PPM placement on 7/24, with worsening SOB, elevated troponin still uptrending today 1.57, crea 0.54, scheduled for a left heart cath. A nuclear stress test  In 07/2016 was negative for ischemia.  BP is elevated, I will add amlodipine 2.5 mg po daily. On heparin, ASA, I will add atorvastatin 20 mg po daily.   Tobias Alexander, MD 03/15/2017

## 2017-03-15 NOTE — Progress Notes (Signed)
ANTICOAGULATION CONSULT NOTE - Follow Up Consult  Pharmacy Consult for heparin Indication: chest pain/ACS  Allergies  Allergen Reactions  . Celebrex [Celecoxib]     LEG ACHING   . Codeine Nausea And Vomiting  . Meloxicam     TUMMY ACHE, INEFFECTIVE  . Nortriptyline Other (See Comments)    unknown  . Percocet [Oxycodone-Acetaminophen]     unknown  . Penicillins Rash    Has patient had a PCN reaction causing immediate rash, facial/tongue/throat swelling, SOB or lightheadedness with hypotension: No Has patient had a PCN reaction causing severe rash involving mucus membranes or skin necrosis: No Has patient had a PCN reaction that required hospitalization No Has patient had a PCN reaction occurring within the last 10 years: No If all of the above answers are "NO", then may proceed with Cephalosporin use.    Labs:  Recent Labs  03/12/17 2331  03/13/17 0539 03/13/17 0846 03/13/17 1759 03/14/17 0520 03/14/17 1409 03/14/17 2323  HGB 10.8*  --  12.5  --   --  11.6*  --   --   HCT 32.5*  --  37.9  --   --  34.8*  --   --   PLT 215  --  244  --   --  239  --   --   HEPARINUNFRC  --   --   --   --   --  0.19* 0.10* 0.15*  CREATININE 0.52  --  0.54  --   --   --   --   --   TROPONINI  --   < > 0.39* 0.92* 1.57*  --   --   --   < > = values in this interval not displayed.  Estimated Creatinine Clearance: 59.5 mL/min (by C-G formula based on SCr of 0.54 mg/dL).  Assessment: 83yo female on heparin for ACS and s/p pacer implant on 7/24.  Heparin level low.  Spoke with RN as rate change was not charted.  Pt on correct rate of 1100 units/hr, but not changed until 9PM.  This is not a steady state level.  Goal of Therapy:  Heparin level 0.3-0.5 units/ml Monitor platelets by anticoagulation protocol: Yes   Plan:  Continue heparin 1100 units/hr F/U AM labs  Alvester MorinKendra Temika Sutphin, B.S., PharmD Clinical Pharmacist [facility]

## 2017-03-15 NOTE — Discharge Instructions (Addendum)
Sharon Gilmore,  You were in the hospital because of concern for a heart attack. You had a heart cath that showed 60% non-critical blockage in one of the main coronary arteries (LAD) and all the other arteries were clear of obstruction.  Your BP was uncontrolled when you arrived.  Your medications were adjusted (HCTZ increased, metoprolol added and enalapril unchanged) and the BP is much better now.  You are going to be dc'd to a higher level of care so that you may receive some rehabilitation.  Best wishes.

## 2017-03-16 ENCOUNTER — Encounter (HOSPITAL_COMMUNITY): Payer: Self-pay | Admitting: Cardiovascular Disease

## 2017-03-16 DIAGNOSIS — E782 Mixed hyperlipidemia: Secondary | ICD-10-CM

## 2017-03-16 LAB — BASIC METABOLIC PANEL
Anion gap: 6 (ref 5–15)
BUN: 9 mg/dL (ref 6–20)
CALCIUM: 9.1 mg/dL (ref 8.9–10.3)
CO2: 25 mmol/L (ref 22–32)
CREATININE: 0.55 mg/dL (ref 0.44–1.00)
Chloride: 103 mmol/L (ref 101–111)
GFR calc Af Amer: >60 mL/min (ref >60)
GFR calc non Af Amer: >60 mL/min (ref >60)
GLUCOSE: 116 mg/dL — AB (ref 65–99)
Potassium: 3.7 mmol/L (ref 3.5–5.1)
Sodium: 134 mmol/L — ABNORMAL LOW (ref 135–145)

## 2017-03-16 LAB — CBC
HCT: 36.1 % (ref 36.0–46.0)
Hemoglobin: 12 g/dL (ref 12.0–15.0)
MCH: 28.4 pg (ref 26.0–34.0)
MCHC: 33.2 g/dL (ref 30.0–36.0)
MCV: 85.5 fL (ref 78.0–100.0)
PLATELETS: 236 10*3/uL (ref 150–400)
RBC: 4.22 MIL/uL (ref 3.87–5.11)
RDW: 15.2 % (ref 11.5–15.5)
WBC: 9.4 10*3/uL (ref 4.0–10.5)

## 2017-03-16 MED ORDER — ENOXAPARIN SODIUM 40 MG/0.4ML ~~LOC~~ SOLN
40.0000 mg | SUBCUTANEOUS | Status: DC
  Administered 2017-03-16: 40 mg via SUBCUTANEOUS
  Filled 2017-03-16: qty 0.4

## 2017-03-16 NOTE — Progress Notes (Signed)
PROGRESS NOTE    Sharon Gilmore  ZOX:[id number] DOB: 05/01/1934 DOA: 03/12/2017 PCP: Olivia CanterKelly, William, MD   Brief Narrative: Sharon Gilmore is a 83 y.o. female with a history of hypertension, status post pacemaker for heart block. Patient presented with hypertensive emergency significant for blood pressures with systolic values in 200s and associated elevated troponin. Cardiology consulted. D-dimer elevated cell CTA significant for no PE. Cardiac cath performed on 7/30 and significant for 60% mid LAD stenosis with no further intervention. Blood pressure poorly controlled and medications being titrated. Plan for discharge to SNF   Assessment & Plan:   Principal Problem:   Hypertensive urgency Active Problems:   Spinal stenosis   History of asthma   SOB (shortness of breath)   Elevated troponin   Hypertensive emergency Evidence of end organ damage with elevated troponin. Possible PE. Wells score of 0 but d-dimer elevated. CTA negative for PE. Cardiac cath only significant for 60% mid LDA stenosis. -cardiology recommendations: metoprolol QID, amlodipine, and started hydralazine -continue enalapril, metoprolol, hydralazine -discontinue amlodipine since it is a small dose and patient would be taking 5 medications (not all optimized) -continue hydralazine prn  Elevated troponin Paired with exertional dyspnea, concern for ACS. Recent stress test from 2017 was normal. Troponin trended down. Cardiac cath as above. Likely demand ischemia in setting of hypertensive emergency.  Spinal stenosis -continue Trileptal and Neurontin  Second degree heart block Patient is status post pacer placement a few days ago. Pacer interrogated per cardiology and no arrhythmias noted.  Headache Tension type. Likely secondary to hypertension. -treat hypertension   DVT prophylaxis: Lovenox Code Status: full code Family Communication: none at bedside Disposition Plan: pending cardiac  cath   Consultants:   cardiology  Procedures:   Echocardiogram (7/28): EF of 55% to 60% with evidence of LVH, mildly dilated left atrium, trivial pericardial effusion  Cardiac catheterization (7/31): 60% mid LAD stenosis  Antimicrobials:  none    Subjective: No pain. Headache.  Objective: Vitals:   03/15/17 2000 03/16/17 0206 03/16/17 0320 03/16/17 0801  BP:  (!) 196/77 (!) 152/57 (!) 132/51  Pulse: 70 74    Resp: (!) 26 (!) 22 (!) 23 19  Temp:  98.2 F (36.8 C)  98.2 F (36.8 C)  TempSrc:  Oral  Oral  SpO2: 97% 95% 98% 95%  Weight:  83 kg (182 lb 15.7 oz)    Height:        Intake/Output Summary (Last 24 hours) at 03/16/17 1137 Last data filed at 03/15/17 2230  Gross per 24 hour  Intake          1201.46 ml  Output              750 ml  Net           451.46 ml   Filed Weights   03/13/17 0606 03/14/17 0640 03/16/17 0206  Weight: 86.2 kg (190 lb 1.6 oz) 85.9 kg (189 lb 6.4 oz) 83 kg (182 lb 15.7 oz)    Examination:  General exam: Appears calm and comfortable Respiratory system: Clear to auscultation. Respiratory effort normal. Cardiovascular system: Regular rate and rhythm. Normal S1 and S2. No heart murmurs present. No extra heart sounds Gastrointestinal system: Soft, non-tender, non-distended, no guarding, no rebound, no masses felt Central nervous system: Alert and oriented. No focal neurological deficits. Extremities: No edema. No calf tenderness Skin: No cyanosis. No rashes Psychiatry: Judgement and insight appear normal. Mood & affect appropriate.     Data Reviewed:  I have personally reviewed following labs and imaging studies  CBC:  Recent Labs Lab 03/12/17 2331 03/13/17 0539 03/14/17 0520 03/15/17 0433 03/16/17 0140  WBC 7.4 10.7* 8.3 8.2 9.4  NEUTROABS 4.5  --   --   --   --   HGB 10.8* 12.5 11.6* 11.1* 12.0  HCT 32.5* 37.9 34.8* 34.4* 36.1  MCV 86.7 85.9 85.7 85.8 85.5  PLT 215 244 239 227 236   Basic Metabolic Panel:  Recent  Labs Lab 03/12/17 2331 03/13/17 0539 03/16/17 0140  NA 133* 133* 134*  K 3.7 3.7 3.7  CL 99* 101 103  CO2 25 25 25   GLUCOSE 104* 124* 116*  BUN 5* <5* 9  CREATININE 0.52 0.54 0.55  CALCIUM 8.5* 8.7* 9.1  MG  --  2.0  --    GFR: Estimated Creatinine Clearance: 58.4 mL/min (by C-G formula based on SCr of 0.55 mg/dL). Liver Function Tests:  Recent Labs Lab 03/12/17 2331 03/13/17 0539  AST 32 40  ALT 36 39  ALKPHOS 88 90  BILITOT 0.4 0.5  PROT 5.8* 6.5  ALBUMIN 3.0* 3.1*   No results for input(s): LIPASE, AMYLASE in the last 168 hours. No results for input(s): AMMONIA in the last 168 hours. Coagulation Profile: No results for input(s): INR, PROTIME in the last 168 hours. Cardiac Enzymes:  Recent Labs Lab 03/13/17 0230 03/13/17 0539 03/13/17 0846 03/13/17 1759 03/15/17 0856  TROPONINI 0.11* 0.39* 0.92* 1.57* 0.45*   BNP (last 3 results) No results for input(s): PROBNP in the last 8760 hours. HbA1C: No results for input(s): HGBA1C in the last 72 hours. CBG: No results for input(s): GLUCAP in the last 168 hours. Lipid Profile: No results for input(s): CHOL, HDL, LDLCALC, TRIG, CHOLHDL, LDLDIRECT in the last 72 hours. Thyroid Function Tests: No results for input(s): TSH, T4TOTAL, FREET4, T3FREE, THYROIDAB in the last 72 hours. Anemia Panel: No results for input(s): VITAMINB12, FOLATE, FERRITIN, TIBC, IRON, RETICCTPCT in the last 72 hours. Sepsis Labs: No results for input(s): PROCALCITON, LATICACIDVEN in the last 168 hours.  Recent Results (from the past 240 hour(s))  MRSA PCR Screening     Status: None   Collection Time: 03/08/17  9:08 PM  Result Value Ref Range Status   MRSA by PCR NEGATIVE NEGATIVE Final    Comment:        The GeneXpert MRSA Assay (FDA approved for NASAL specimens only), is one component of a comprehensive MRSA colonization surveillance program. It is not intended to diagnose MRSA infection nor to guide or monitor treatment  for MRSA infections.          Radiology Studies: Ct Angio Chest Pe W Or Wo Contrast  Result Date: 03/14/2017 CLINICAL DATA:  Elevated D-dimer. Pacemaker placed on Tuesday. Shortness of breath. EXAM: CT ANGIOGRAPHY CHEST WITH CONTRAST TECHNIQUE: Multidetector CT imaging of the chest was performed using the standard protocol during bolus administration of intravenous contrast. Multiplanar CT image reconstructions and MIPs were obtained to evaluate the vascular anatomy. CONTRAST:  100 cc Isovue 370 intravenously. COMPARISON:  Chest radiograph 03/13/2017 FINDINGS: Cardiovascular: Satisfactory opacification of the pulmonary arteries to the segmental level. No evidence of pulmonary embolism. Normal heart size. No pericardial effusion. Dual lead cardiac pacemaker with leads within the right atrium and right ventricle. Mild calcific atherosclerotic disease of the coronary arteries. Prominence of the ascending aorta measuring 3.9 cm. Mediastinum/Nodes: No enlarged mediastinal, hilar, or axillary lymph nodes. Thyroid gland, trachea, and esophagus demonstrate no significant findings. Lungs/Pleura: Minimal pleural  effusions. Subsegmental atelectasis in the left lung base. Segmental atelectasis in the right lung base. Mild interstitial pulmonary edema. Upper Abdomen: No acute abnormality. Musculoskeletal: No chest wall abnormality. No acute or significant osseous findings. Review of the MIP images confirms the above findings. IMPRESSION: No evidence pulmonary embolus. Ascending aorta measures 3.9 cm, upper limits of normal. Mild calcific atherosclerotic disease of the coronary arteries. Mild interstitial pulmonary edema with minimal bilateral pleural effusions. Segmental atelectasis of the right lower lobe and subsegmental atelectasis of left lower lobe. Electronically Signed   By: Ted Mcalpineobrinka  Dimitrova M.D.   On: 03/14/2017 18:39        Scheduled Meds: . aspirin EC  81 mg Oral QHS  . atorvastatin  20 mg Oral  q1800  . enalapril  20 mg Oral Daily  . gabapentin  300 mg Oral QHS  . hydrochlorothiazide  25 mg Oral Daily  . LORazepam  1 mg Intravenous Once  . metoprolol tartrate  25 mg Oral QID  . multivitamin  1 tablet Oral BID  . OXcarbazepine  150 mg Oral QPM  . sodium chloride flush  3 mL Intravenous Q12H   Continuous Infusions: . sodium chloride       LOS: 2 days     Jacquelin Hawkingalph Liane Tribbey, MD [facility] 03/16/2017, 11:37 AM Pager: [phone]  If 7PM-7AM, please contact night-coverage [email or web address] Password [email or web address] 03/16/2017, 11:37 AM

## 2017-03-16 NOTE — Progress Notes (Signed)
TR BAND REMOVAL  LOCATION:    right radial  DEFLATED PER PROTOCOL:    Yes.    TIME BAND OFF / DRESSING APPLIED:    20:30   SITE UPON ARRIVAL:    Level 0  SITE AFTER BAND REMOVAL:    Level 0  CIRCULATION SENSATION AND MOVEMENT:    Within Normal Limits   Yes.    COMMENTS:   Post TR band instructions given. Pt tolerated well. 

## 2017-03-16 NOTE — Care Management Note (Addendum)
Case Management Note  Patient Details  Name: Sharon Gilmore MRN: [id number] Date of Birth: [birth year]  Subjective/Objective:   [facility], s/p left heart cath,  per pt eval rec SNF, CSW aware.  She is for dc tomorrow.                 Action/Plan: NCM will follow along with CSW for dc needs.  Expected Discharge Date:  03/16/17               Expected Discharge Plan:     In-House Referral:     Discharge planning Services  CM Consult  Post Acute Care Choice:    Choice offered to:     DME Arranged:    DME Agency:     HH Arranged:    HH Agency:     Status of Service:  In process, will continue to follow  If discussed at Long Length of Stay Meetings, dates discussed:    Additional Comments:  Leone Havenaylor, Arlene Brickel Clinton, RN 03/16/2017, 11:01 AM

## 2017-03-16 NOTE — Clinical Social Work Placement (Signed)
   CLINICAL SOCIAL WORK PLACEMENT  NOTE  Date:  03/16/2017  Patient Details  Name: Sharon Gilmore MRN: [id number] Date of Birth: 07-25-1934  Clinical Social Work is seeking post-discharge placement for this patient at the Skilled  Nursing Facility level of care (*CSW will initial, date and re-position this form in  chart as items are completed):  No ([facility])   [facility]/family provided with [facility] Clinical Social Work Department's list of facilities offering this level of care within the geographic area requested by the patient (or if unable, by the patient's family).  Yes   Patient/family informed of their freedom to choose among providers that offer the needed level of care, that participate in Medicare, Medicaid or managed care program needed by the patient, have an available bed and are willing to accept the patient.  No   Patient/family informed of [facility] ownership interest in [facility] and [facility], as well as of the fact that they are under no obligation to receive care at these facilities.  PASRR submitted to EDS on 03/16/17     PASRR number received on 03/16/17     Existing PASRR number confirmed on       FL2 transmitted to all facilities in geographic area requested by pt/family on 03/16/17     FL2 transmitted to all facilities within larger geographic area on       Patient informed that his/her managed care company has contracts with or will negotiate with certain facilities, including the following:        Yes   Patient/family informed of bed offers received.  [facility]     Physician recommends and patient chooses bed at      [facility]  Patient to be [facility] to facility [facility]     Patient family notified on 03/16/17 of transfer.  Name of family member notified:  Sons  Bill and Jim     PHYSICIAN       Additional Comment:    _______________________________________________ Cristobal Goldmannrawford, Sharon Scantlebury Bradley, LCSW 03/16/2017, 5:30 PM

## 2017-03-16 NOTE — [place] FL2 (Signed)
[place] MEDICAID FL2 LEVEL OF CARE SCREENING TOOL     IDENTIFICATION  Patient Name: Sharon Gilmore Birthdate: Feb 10, 1934 Sex: female Admission Date (Current Location): 03/12/2017  [facility] [place]   Facility and Address:  [facility], [address], [place], [place] [address]      Provider Number: [id number]  Attending Physician Name and Address:  Delano Metzobert Charliene Inoue  Relative Name and Phone Number:  Rosaria FerriesMartineau,Jim, Son-[phone] Schmidt; Blowe,William, Son - [phone];  Ernie AvenaMartineau,Stacy, daughter-in-law, [phone]      Current Level of Care: Hospital Recommended Level of Care: Skilled Nursing Facility [facility] Prior Approval Number:    Date Approved/Denied:   PASRR Number: [id number] (Eff. 03/16/17)  Discharge Plan: SNF ([facility])    Current Diagnoses: Patient Active Problem List   Diagnosis Date Noted  . SOB (shortness of breath) 03/13/2017  . Hypertensive urgency 03/13/2017  . Elevated troponin 03/13/2017  . DOE (dyspnea on exertion) 03/08/2017  . AV block, 2nd degree 03/08/2017  . Essential hypertension 03/08/2017  . Family history of coronary artery disease in brother 03/08/2017  . History of asthma 03/08/2017  . AVB (atrioventricular block) 03/08/2017  . Abdominal pain 01/04/2017  . Spinal stenosis 01/04/2017    Orientation RESPIRATION BLADDER Height & Weight     Self, Time, Situation, Place  Normal Continent Weight: 182 lb 15.7 oz (83 kg) Height:  5\' 4"  (162.6 cm)  BEHAVIORAL SYMPTOMS/MOOD NEUROLOGICAL BOWEL NUTRITION STATUS    Convulsions/Seizures (History of seizures) Continent Diet (Heart healthy)  AMBULATORY STATUS COMMUNICATION OF NEEDS Skin   Limited Assist Verbally Other (Comment) (Incision left chest)                       Personal Care Assistance Level of Assistance  Bathing, Feeding, Dressing Bathing Assistance: Limited assistance Feeding assistance:  Independent Dressing Assistance: Limited assistance     Functional Limitations Info  Sight, Hearing, Speech Sight Info: Adequate Hearing Info: Adequate Speech Info: Adequate    SPECIAL CARE FACTORS FREQUENCY  PT (By licensed PT)     PT Frequency: Evaluated 7/31 and a minimum of 3X per week therapy recommended              Contractures Contractures Info: Not present    Additional Factors Info  Code Status, Allergies Code Status Info: Full Allergies Info: Celebrex, Codeine, Meloxicam, Notriptyline, Percocet, Penicillins           Current Medications (03/16/2017):  This is the current hospital active medication list Current Facility-Administered Medications  Medication Dose Route Frequency Provider Last Rate Last Dose  . 0.9 %  sodium chloride infusion  250 mL Intravenous PRN Runell GessBerry, Jonathan J, MD      . acetaminophen (TYLENOL) tablet 650 mg  650 mg Oral Q6H PRN Eduard ClosKakrakandy, Arshad N, MD   650 mg at 03/13/17 1847   Or  . acetaminophen (TYLENOL) suppository 650 mg  650 mg Rectal Q6H PRN Eduard ClosKakrakandy, Arshad N, MD      . acetaminophen (TYLENOL) tablet 650 mg  650 mg Oral Q4H PRN Runell GessBerry, Jonathan J, MD      . aspirin EC tablet 81 mg  81 mg Oral QHS Eduard ClosKakrakandy, Arshad N, MD   81 mg at 03/15/17 2122  . atorvastatin (LIPITOR) tablet 20 mg  20 mg Oral q1800 Lars MassonNelson, Katarina H, MD      . enalapril (VASOTEC) tablet 20 mg  20 mg Oral Daily Eduard ClosKakrakandy, Arshad N, MD   20  mg at 03/16/17 1037  . enoxaparin (LOVENOX) injection 40 mg  40 mg Subcutaneous Q24H Narda BondsNettey, Ralph A, MD   40 mg at 03/16/17 1330  . gabapentin (NEURONTIN) capsule 300 mg  300 mg Oral QHS Eduard ClosKakrakandy, Arshad N, MD   300 mg at 03/15/17 2121  . hydrALAZINE (APRESOLINE) injection 5 mg  5 mg Intravenous Q4H PRN Narda BondsNettey, Ralph A, MD   5 mg at 03/16/17 0217  . hydrochlorothiazide (HYDRODIURIL) tablet 25 mg  25 mg Oral Daily Narda BondsNettey, Ralph A, MD   25 mg at 03/16/17 1037  . LORazepam (ATIVAN) injection 1 mg  1 mg Intravenous Once  Lars MassonNelson, Katarina H, MD      . metoprolol tartrate (LOPRESSOR) tablet 25 mg  25 mg Oral QID Pricilla Riffleoss, Paula V, MD   25 mg at 03/16/17 1456  . multivitamin (PROSIGHT) tablet 1 tablet  1 tablet Oral BID Eduard ClosKakrakandy, Arshad N, MD   1 tablet at 03/16/17 1037  . ondansetron (ZOFRAN) tablet 4 mg  4 mg Oral Q6H PRN Eduard ClosKakrakandy, Arshad N, MD       Or  . ondansetron [facility](ZOFRAN) injection 4 mg  4 mg Intravenous Q6H PRN Eduard ClosKakrakandy, Arshad N, MD      . Oxcarbazepine (TRILEPTAL) tablet 150 mg  150 mg Oral QPM Eduard ClosKakrakandy, Arshad N, MD   150 mg at 03/14/17 2042  . sodium chloride flush (NS) 0.9 % injection 3 mL  3 mL Intravenous Q12H Runell GessBerry, Jonathan J, MD   3 mL at 03/16/17 1200  . sodium chloride flush (NS) 0.9 % injection 3 mL  3 mL Intravenous PRN Runell GessBerry, Jonathan J, MD      . traMADol Janean Sark(ULTRAM) tablet 50 mg  50 mg Oral Q6H PRN Eduard ClosKakrakandy, Arshad N, MD   50 mg at 03/14/17 2340     Discharge Medications: Please see discharge summary for a list of discharge medications.  Relevant Imaging Results:  Relevant Lab Results:   Additional Information ss#[id number].  Cristobal Goldmannrawford, Vanessa Bradley, LCSW

## 2017-03-16 NOTE — Clinical Social Work Note (Signed)
Clinical Social Work Assessment  Patient Details  Name: Sharon Gilmore MRN: [id number] Date of Birth: [birth year]  Date of referral:  03/16/17               Reason for consult:  Facility Placement                Permission sought to share information with:  Family Supports Permission granted to share information::     Name::     Lucius Conn or Diona Fanti  Agency::     Relationship::  Sons  Contact Information:  Rush Landmark [phone] or Clair Gulling [phone]  Housing/Transportation Living arrangements for the past 2 months:  Apartment ([place] [facility] [place]) Source of Information:  Patient Patient Interpreter Needed:  None Criminal Activity/Legal Involvement Pertinent to Current Situation/Hospitalization:  Yes Significant Relationships:  Adult Children, Other Family Members Lives with:  Self Do you feel safe going back to the place where you live?  No (Patient in agreement with rehab at [facility] before returning to her apartment) Need for family participation in patient care:  Yes (Comment)  Care giving concerns:  Patient in agreement with ST rehab prior to returning to her apartment at [facility], for strengthening and safety reasons.   Social Worker assessment / plan: CSW talked with patient at the bedside, approximately 3 times today regarding discharge disposition. Sharon Gilmore is from [place] and needs ST rehab once discharged. Patient was sitting up in the bed and on another visit was sitting in a chair at bedside, alert, oriented, and very pleasant and engaged in the conversation regarding her discharge plan. CSW also met some of patient's family and they were also pleasant. Patient agreeable to ST rehab at [facility] and is aware that she will discharge on Wednesday.    Employment status:  Retired Health visitor, Managed Care PT Recommendations:  [place] / Referral to community resources:  [place] (None needed or requested as patient from [employer] and going to SNF at [facility] for rehab)  Patient/Family's Response to care: Patient expressed no concerns regarding her care during hospitalization.  Patient/Family's Understanding of and Emotional Response to Diagnosis, Current Treatment, and Prognosis: Not discussed.  Emotional Assessment Appearance:  Appears stated age Attitude/Demeanor/Rapport:   (Appropriate) Affect (typically observed):  Accepting, Pleasant, Appropriate Orientation:  Oriented to Self, Oriented to Place, Oriented to  Time, Oriented to Situation Alcohol / Substance use:  Tobacco Use, Alcohol Use, Illicit Drugs (Patient reports that she quit smoking and drinks alcohol approx 1-2 times per week and does not use illicit drugs) Psych involvement (Current and /or in the community):  No (Comment)  Discharge Needs  Concerns to be addressed:  Discharge Planning Concerns Readmission within the last 30 days:  Yes Current discharge risk:  None Barriers to Discharge:  No Barriers Identified   Sable Feil, LCSW 03/16/2017, 5:22 PM

## 2017-03-16 NOTE — Progress Notes (Signed)
Progress Note  Patient Name: [name] Rhymes Date of Encounter: 03/16/2017  Primary Cardiologist: Allred  Subjective   83 y.o.femalewith recent PPM placement on 7/24 Developed SOB a couple days later Echo: LVEF normal Triv effusion LE doppler neg for DVT (sl swelling). Admitted on 7/28. Underwent a cardiac catheterization with medical management 60% mid LAD.   BP has been poorly controlled overnight elevating as high as 214/77, very concerned about her BP. She continues to be very fatigued. Wants to do rehab for a few days at the facility that she lives at, we need Case Manager to help facilitate  this.   Blood pressure (!) 132/51, pulse 74, temperature 98.2 F (36.8 C), temperature source Oral, resp. rate 19, height 5\' 4"  (1.626 m), weight 182 lb 15.7 oz (83 kg), SpO2 95 %.   Inpatient Medications    Scheduled Meds: . amLODipine  2.5 mg Oral Daily  . aspirin EC  81 mg Oral QHS  . atorvastatin  20 mg Oral q1800  . enalapril  20 mg Oral Daily  . gabapentin  300 mg Oral QHS  . hydrochlorothiazide  25 mg Oral Daily  . LORazepam  1 mg Intravenous Once  . metoprolol tartrate  25 mg Oral QID  . multivitamin  1 tablet Oral BID  . OXcarbazepine  150 mg Oral QPM  . sodium chloride flush  3 mL Intravenous Q12H   Continuous Infusions: . sodium chloride     PRN Meds: sodium chloride, acetaminophen **OR** acetaminophen, acetaminophen, hydrALAZINE, ondansetron **OR** ondansetron (ZOFRAN) IV, sodium chloride flush, traMADol   Vital Signs    Vitals:   03/15/17 2000 03/16/17 0206 03/16/17 0320 03/16/17 0801  BP:  (!) 196/77 (!) 152/57 (!) 132/51  Pulse: 70 74    Resp: (!) 26 (!) 22 (!) 23 19  Temp:  98.2 F (36.8 C)  98.2 F (36.8 C)  TempSrc:  Oral  Oral  SpO2: 97% 95% 98% 95%  Weight:  182 lb 15.7 oz (83 kg)    Height:        Intake/Output Summary (Last 24 hours) at 03/16/17 0845 Last data filed at 03/15/17 2230  Gross per 24 hour  Intake          1201.46  ml  Output             1050 ml  Net           151.46 ml   Filed Weights   03/13/17 0606 03/14/17 0640 03/16/17 0206  Weight: 190 lb 1.6 oz (86.2 kg) 189 lb 6.4 oz (85.9 kg) 182 lb 15.7 oz (83 kg)    Telemetry    NSR - Personally Reviewed   Physical Exam   GEN:No acute distress.  Neck:No JVD Cardiac:RRR, no murmurs, rubs, or gallops.  Respiratory:normal effort of breathing ZO:XWRUGI:Soft, nontender, non-distended  MS:No edema; No deformity. Right radial catheter site is stable. Neuro:Nonfocal  Psych: Normal affect    Labs    Chemistry Recent Labs Lab 03/12/17 2331 03/13/17 0539 03/16/17 0140  NA 133* 133* 134*  K 3.7 3.7 3.7  CL 99* 101 103  CO2 25 25 25   GLUCOSE 104* 124* 116*  BUN 5* <5* 9  CREATININE 0.52 0.54 0.55  CALCIUM 8.5* 8.7* 9.1  PROT 5.8* 6.5  --   ALBUMIN 3.0* 3.1*  --   AST 32 40  --   ALT 36 39  --   ALKPHOS 88 90  --   BILITOT 0.4 0.5  --  GFRNONAA >60 >60 >60  GFRAA >60 >60 >60  ANIONGAP 9 7 6      Hematology Recent Labs Lab 03/14/17 0520 03/15/17 0433 03/16/17 0140  WBC 8.3 8.2 9.4  RBC 4.06 4.01 4.22  HGB 11.6* 11.1* 12.0  HCT 34.8* 34.4* 36.1  MCV 85.7 85.8 85.5  MCH 28.6 27.7 28.4  MCHC 33.3 32.3 33.2  RDW 15.3 15.3 15.2  PLT 239 227 236    Cardiac Enzymes Recent Labs Lab 03/13/17 0539 03/13/17 0846 03/13/17 1759 03/15/17 0856  TROPONINI 0.39* 0.92* 1.57* 0.45*    Recent Labs Lab 03/13/17 0028  TROPIPOC 0.10*     BNP Recent Labs Lab 03/12/17 2331  BNP 161.4*     DDimer  Recent Labs Lab 03/13/17 2253  DDIMER 1.96*     Radiology    Ct Angio Chest Pe W Or Wo Contrast  Result Date: 03/14/2017 CLINICAL DATA:  Elevated D-dimer. Pacemaker placed on Tuesday. Shortness of breath. EXAM: CT ANGIOGRAPHY CHEST WITH CONTRAST TECHNIQUE: Multidetector CT imaging of the chest was performed using the standard protocol during bolus administration of intravenous contrast. Multiplanar CT image reconstructions  and MIPs were obtained to evaluate the vascular anatomy. CONTRAST:  100 cc Isovue 370 intravenously. COMPARISON:  Chest radiograph 03/13/2017 FINDINGS: Cardiovascular: Satisfactory opacification of the pulmonary arteries to the segmental level. No evidence of pulmonary embolism. Normal heart size. No pericardial effusion. Dual lead cardiac pacemaker with leads within the right atrium and right ventricle. Mild calcific atherosclerotic disease of the coronary arteries. Prominence of the ascending aorta measuring 3.9 cm. Mediastinum/Nodes: No enlarged mediastinal, hilar, or axillary lymph nodes. Thyroid gland, trachea, and esophagus demonstrate no significant findings. Lungs/Pleura: Minimal pleural effusions. Subsegmental atelectasis in the left lung base. Segmental atelectasis in the right lung base. Mild interstitial pulmonary edema. Upper Abdomen: No acute abnormality. Musculoskeletal: No chest wall abnormality. No acute or significant osseous findings. Review of the MIP images confirms the above findings. IMPRESSION: No evidence pulmonary embolus. Ascending aorta measures 3.9 cm, upper limits of normal. Mild calcific atherosclerotic disease of the coronary arteries. Mild interstitial pulmonary edema with minimal bilateral pleural effusions. Segmental atelectasis of the right lower lobe and subsegmental atelectasis of left lower lobe. Electronically Signed   By: Ted Mcalpine M.D.   On: 03/14/2017 18:39    Cardiac Studies   Cardiac Catheterization 03/15/2017  IMPRESSION: Essentially normal coronary arteries with a 60% mid LAD after a moderate size diagonal branch probably of no hemodynamic significance. The LVEDP was mildly elevated as was her blood pressure. I suspect her mildly elevated troponin was from demand ischemia and hypertension. Medical therapy will be recommended. The sheath was removed and a TR band was placed on the right wrist to achieve patent hemostasis. The patient left the lab in  stable condition.  Transthoracic Echocardiogram 03/13/2017 Study Conclusions  - Left ventricle: The cavity size was normal. Wall thickness was increased in a pattern of mild LVH. Systolic function was normal. The estimated ejection fraction was in the range of 55% to 60%. Doppler parameters are consistent with both elevated ventricular end-diastolic filling pressure and elevated left atrial filling pressure. - Aortic valve: Valve area (Vmax): 2.59 cm^2. - Mitral valve: Valve area by continuity equation (using LVOT flow): 2.07 cm^2. - Left atrium: The atrium was mildly dilated. - Atrial septum: No defect or patent foramen ovale was identified. - Pericardium, extracardiac: A trivial pericardial effusion was identified posterior to the heart.  Patient Profile     83 y.o.femalewith recent PPM  placement on 7/24 Developed SOB a couple days later Echo: LVEF normal Triv effusion LE doppler neg for DVT (sl swelling). Admitted on 7/28.   Assessment & Plan     1. Hypertensive Emergency:  Underwent cardiac catheterization, essentially normal coronary arteries with a 60% mid LAD. Mildly elevated LVEDP.  Elevated troponin suspected to be related to demand ischemia and hypertension. Medical therapy is recommended. -- patients blood pressure was poorly controlled overnight. Need to obtained more aggressive control. --  Home Norvasc 2.5 mg, Enalapril 20mg , HCTZ 25 mg, Metoprolol 25 mg,  -- received PRN Hydralazine at 2:17am this morning, was effective. -- Recommend adding hydralazine 25 mg BID to her daily regimen or increasing  her home BP medications for gain control.  2. PPM: Interrogated  Functioning normally  No arrhythmias    Signed, Dorthula MatasGREENE,TIFFANY G, PA-C  03/16/2017, 8:45 AM    The patient was seen, examined and discussed with Marlon Peliffany Greene, PA-C and I agree with the above.    83 y.o.femalewith recent PPM placement on 7/24, with worsening SOB, admitted with  NSTEMI, max troponin 1.57 -> 0.54, cath yesterday showed Mid LAD lesion, 60 %stenosed. Crea 0.55 today, she is chest pain free. BP was elevated earlier this am, now controlled, we added amlodipine 2.5 mg po daily. Continue ASA, and atorvastatin 20 mg po daily.  We will arrange a follow up in our clinic within 7-14 days with BP check up and repeat CMP and lipids.  Discharge today.  Tobias AlexanderKatarina Tobenna Needs, MD

## 2017-03-16 NOTE — Evaluation (Signed)
Physical Therapy Evaluation Patient Details Name: Sharon EvaJudith A Trent MRN: [id number] DOB: 02-21-1934 Today's Date: 03/16/2017   History of Present Illness  Sharon Gilmore is a 83 y.o. female with a history of hypertension, status post pacemaker for heart block. Patient presented with hypertensive emergency significant for blood pressures with systolic values in 200s and associated elevated troponin.  Patient with SOB with bil pleural effusions.  Cardiac cath 7/30 with 60% LAD - medical management.  Clinical Impression  Patient presents with problems listed below.  Will benefit from acute PT to maximize functional mobility prior to discharge.  Patient with limited ability to use BUE's, impacting function. Patient also with unsteady gait and increased fall risk.  BP following gait 174/72, and dyspnea 2/4 - decreased activity tolerance.  Recommend patient d/c to SNF for continued therapy prior to returning home.    Follow Up Recommendations SNF;Supervision for mobility/OOB    Equipment Recommendations  None recommended by PT    Recommendations for Other Services       Precautions / Restrictions Precautions Precautions: Fall;ICD/Pacemaker;Other (comment) (RUE limited after cardiac cath) Restrictions Weight Bearing Restrictions: Yes RUE Weight Bearing: Non weight bearing LUE Weight Bearing:  (Limit weight bearing)      Mobility  Bed Mobility Overal bed mobility: Needs Assistance Bed Mobility: Rolling;Sidelying to Sit Rolling: Min assist Sidelying to sit: Min assist       General bed mobility comments: Patient requiring assist with movement of upper body due to decreased use of BUE's  Transfers Overall transfer level: Needs assistance Equipment used: Rolling walker (2 wheeled) Transfers: Sit to/from Stand Sit to Stand: Min assist         General transfer comment: Verbal cues for hand placement. Cues to scoot to edge of bed/chair to assist with transfer.  Assist to bring  trunk forward over feet to move to stance.  Cues for hand placement and assist to bring trunk forward to return to sitting with controlled descent.  Ambulation/Gait Ambulation/Gait assistance: Min assist Ambulation Distance (Feet): 24 Feet Assistive device: Rolling walker (2 wheeled) Gait Pattern/deviations: Step-to pattern;Decreased stance time - left;Decreased step length - right;Decreased step length - left;Decreased stride length;Decreased weight shift to left;Shuffle;Decreased dorsiflexion - left;Trunk flexed Gait velocity: decreased Gait velocity interpretation: Below normal speed for age/gender General Gait Details: Patient attempting to use RW with LUE only.  PT assisting with control of RW.  Patient with decreased DF during swing phase of Lt foot - short step.  Patient with slow, unsteady gait.  Recommended patient try cane until she can safely use BUE's.  Stairs            Wheelchair Mobility    Modified Rankin (Stroke Patients Only)       Balance Overall balance assessment: Needs assistance         Standing balance support: Single extremity supported Standing balance-Leahy Scale: Poor                               Pertinent Vitals/Pain Pain Assessment: No/denies pain (Reports Lt foot totally numb)  BP after gait - 174/72.  Dyspnea 2/4. Decreased activity tolerance.    Home Living Family/patient expects to be discharged to:: Skilled nursing facility Living Arrangements: Alone             Home Equipment: Dan HumphreysWalker - 4 wheels;Cane - single point (Tall toilet) Additional Comments: Patient lives in [facility]F at [place].    Prior Function  Level of Independence: Independent               Hand Dominance   Dominant Hand: Right    Extremity/Trunk Assessment   Upper Extremity Assessment Upper Extremity Assessment: RUE deficits/detail;LUE deficits/detail RUE Deficits / Details: NWB following radial access for cath. LUE Deficits / Details:  Decreased shoulder ROM and decreased lifting following PPM insertion.    Lower Extremity Assessment Lower Extremity Assessment: LLE deficits/detail LLE Deficits / Details: Decreased strength at ankle - DF, eversion.  Decreased sensation foot LLE Sensation: decreased light touch LLE Coordination: decreased gross motor       Communication   Communication: No difficulties  Cognition Arousal/Alertness: Awake/alert Behavior During Therapy: WFL for tasks assessed/performed;Anxious Overall Cognitive Status: Within Functional Limits for tasks assessed                                        General Comments      Exercises     Assessment/Plan    PT Assessment Patient needs continued PT services  PT Problem List Decreased strength;Decreased range of motion;Decreased activity tolerance;Decreased balance;Decreased mobility;Decreased knowledge of use of DME;Decreased safety awareness;Decreased knowledge of precautions;Cardiopulmonary status limiting activity;Impaired sensation       PT Treatment Interventions DME instruction;Gait training;Functional mobility training;Therapeutic activities;Therapeutic exercise;Neuromuscular re-education;Patient/family education    PT Goals (Current goals can be found in the Care Plan section)  Acute Rehab PT Goals Patient Stated Goal: To go to rehab before I go home PT Goal Formulation: With patient Time For Goal Achievement: 03/23/17 Potential to Achieve Goals: Good    Frequency Min 3X/week   Barriers to discharge Decreased caregiver support Patient lives alone    Co-evaluation               AM-PAC PT "6 Clicks" Daily Activity  Outcome Measure Difficulty turning over in bed (including adjusting bedclothes, sheets and blankets)?: A Little Difficulty moving from lying on back to sitting on the side of the bed? : Total Difficulty sitting down on and standing up from a chair with arms (e.g., wheelchair, bedside commode,  etc,.)?: Total Help needed moving to and from a bed to chair (including a wheelchair)?: A Little Help needed walking in hospital room?: A Little Help needed climbing 3-5 steps with a railing? : A Little 6 Click Score: 14    End of Session Equipment Utilized During Treatment: Gait belt Activity Tolerance: Patient limited by fatigue Patient left: in chair;with call bell/phone within reach Nurse Communication: Mobility status (Recommend SNF at d/c) PT Visit Diagnosis: Unsteadiness on feet (R26.81);Other abnormalities of gait and mobility (R26.89);Muscle weakness (generalized) (M62.81)    Time: [id number] PT Time Calculation (min) (ACUTE ONLY): 24 min   Charges:   PT Evaluation $PT Eval Moderate Complexity: 1 Mod PT Treatments $Gait Training: 8-22 mins   PT G Codes:        Durenda HurtSusan H. Renaldo Fiddleravis, PT, [facility] Acute Rehab Services Pager [phone]   Vena AustriaSusan H Zaccary Creech 03/16/2017, 11:25 AM

## 2017-03-17 DIAGNOSIS — M4805 Spinal stenosis, thoracolumbar region: Secondary | ICD-10-CM

## 2017-03-17 DIAGNOSIS — Z8709 Personal history of other diseases of the respiratory system: Secondary | ICD-10-CM

## 2017-03-17 MED ORDER — METOPROLOL TARTRATE 25 MG PO TABS: 25.0000 mg | ORAL_TABLET | Freq: Four times a day (QID) | ORAL | 3 refills | Status: DC

## 2017-03-17 MED ORDER — ACETAMINOPHEN 325 MG PO TABS: 650.0000 mg | ORAL_TABLET | ORAL | Status: DC | PRN

## 2017-03-17 MED ORDER — HYDROCHLOROTHIAZIDE 25 MG PO TABS: 25.0000 mg | ORAL_TABLET | Freq: Every day | ORAL | 2 refills | Status: DC

## 2017-03-17 MED ORDER — ATORVASTATIN CALCIUM 20 MG PO TABS: 20.0000 mg | ORAL_TABLET | Freq: Every day | ORAL | 2 refills | Status: DC

## 2017-03-17 MED ORDER — HYDROCHLOROTHIAZIDE 25 MG PO TABS: 25.0000 mg | ORAL_TABLET | Freq: Every day | ORAL | Status: DC

## 2017-03-17 MED ORDER — METOPROLOL TARTRATE 25 MG PO TABS: 25.0000 mg | ORAL_TABLET | Freq: Four times a day (QID) | ORAL | Status: DC

## 2017-03-17 NOTE — Progress Notes (Signed)
Progress Note  Patient Name: Sharon Gilmore Date of Encounter: 03/17/2017  Primary Cardiologist: Allred  Subjective   82 y.o.femalewith recent PPM placement on 7/24 Developed SOB a couple days later Echo: LVEF normal Triv effusion LE doppler neg for DVT (sl swelling). Admitted on 7/28. Underwent a cardiac catheterization with medical management 60% mid LAD.  BP is now controlled.  Blood pressure (!) 132/51, pulse 74, temperature 98.2 F (36.8 C), temperature source Oral, resp. rate 19, height 5\' 4"  (1.626 m), weight 182 lb 15.7 oz (83 kg), SpO2 95 %.  Inpatient Medications    Scheduled Meds: . aspirin EC  81 mg Oral QHS  . atorvastatin  20 mg Oral q1800  . enalapril  20 mg Oral Daily  . enoxaparin (LOVENOX) injection  40 mg Subcutaneous Q24H  . gabapentin  300 mg Oral QHS  . hydrochlorothiazide  25 mg Oral Daily  . LORazepam  1 mg Intravenous Once  . metoprolol tartrate  25 mg Oral QID  . multivitamin  1 tablet Oral BID  . OXcarbazepine  150 mg Oral QPM  . sodium chloride flush  3 mL Intravenous Q12H   Continuous Infusions: . sodium chloride     PRN Meds: sodium chloride, acetaminophen **OR** acetaminophen, acetaminophen, hydrALAZINE, ondansetron **OR** ondansetron (ZOFRAN) IV, sodium chloride flush, traMADol   Vital Signs    Vitals:   03/16/17 1753 03/16/17 2207 03/17/17 0439 03/17/17 0912  BP: (!) 153/59 (!) 133/45 (!) 144/52 (!) 134/54  Pulse: 66 70 68 75  Resp: 18 18 18 18   Temp: 98.4 F (36.9 C) 98.2 F (36.8 C) 97.8 F (36.6 C) 97.9 F (36.6 C)  TempSrc: Oral Oral Oral Oral  SpO2: 96% 95% 93% 95%  Weight:      Height:        Intake/Output Summary (Last 24 hours) at 03/17/17 1201 Last data filed at 03/17/17 0917  Gross per 24 hour  Intake              480 ml  Output              750 ml  Net             -270 ml   Filed Weights   03/13/17 0606 03/14/17 0640 03/16/17 0206  Weight: 190 lb 1.6 oz (86.2 kg) 189 lb 6.4 oz (85.9 kg) 182 lb  15.7 oz (83 kg)    Telemetry    NSR - Personally Reviewed   Physical Exam   GEN:No acute distress.  Neck:No JVD Cardiac:RRR, no murmurs, rubs, or gallops.  Respiratory:normal effort of breathing WU:JWJXGI:Soft, nontender, non-distended  MS:No edema; No deformity. Right radial catheter site is stable. Neuro:Nonfocal  Psych: Normal affect    Labs    Chemistry  Recent Labs Lab 03/12/17 2331 03/13/17 0539 03/16/17 0140  NA 133* 133* 134*  K 3.7 3.7 3.7  CL 99* 101 103  CO2 25 25 25   GLUCOSE 104* 124* 116*  BUN 5* <5* 9  CREATININE 0.52 0.54 0.55  CALCIUM 8.5* 8.7* 9.1  PROT 5.8* 6.5  --   ALBUMIN 3.0* 3.1*  --   AST 32 40  --   ALT 36 39  --   ALKPHOS 88 90  --   BILITOT 0.4 0.5  --   GFRNONAA >60 >60 >60  GFRAA >60 >60 >60  ANIONGAP 9 7 6      Hematology  Recent Labs Lab 03/14/17 0520 03/15/17 0433 03/16/17 0140  WBC 8.3  8.2 9.4  RBC 4.06 4.01 4.22  HGB 11.6* 11.1* 12.0  HCT 34.8* 34.4* 36.1  MCV 85.7 85.8 85.5  MCH 28.6 27.7 28.4  MCHC 33.3 32.3 33.2  RDW 15.3 15.3 15.2  PLT 239 227 236    Cardiac Enzymes  Recent Labs Lab 03/13/17 0539 03/13/17 0846 03/13/17 1759 03/15/17 0856  TROPONINI 0.39* 0.92* 1.57* 0.45*     Recent Labs Lab 03/13/17 0028  TROPIPOC 0.10*     BNP  Recent Labs Lab 03/12/17 2331  BNP 161.4*     DDimer   Recent Labs Lab 03/13/17 2253  DDIMER 1.96*     Radiology    No results found.  Cardiac Studies   Cardiac Catheterization 03/15/2017  IMPRESSION: Essentially normal coronary arteries with a 60% mid LAD after a moderate size diagonal branch probably of no hemodynamic significance. The LVEDP was mildly elevated as was her blood pressure. I suspect her mildly elevated troponin was from demand ischemia and hypertension. Medical therapy will be recommended. The sheath was removed and a TR band was placed on the right wrist to achieve patent hemostasis. The patient left the lab in stable  condition.  Transthoracic Echocardiogram 03/13/2017 Study Conclusions  - Left ventricle: The cavity size was normal. Wall thickness was increased in a pattern of mild LVH. Systolic function was normal. The estimated ejection fraction was in the range of 55% to 60%. Doppler parameters are consistent with both elevated ventricular end-diastolic filling pressure and elevated left atrial filling pressure. - Aortic valve: Valve area (Vmax): 2.59 cm^2. - Mitral valve: Valve area by continuity equation (using LVOT flow): 2.07 cm^2. - Left atrium: The atrium was mildly dilated. - Atrial septum: No defect or patent foramen ovale was identified. - Pericardium, extracardiac: A trivial pericardial effusion was identified posterior to the heart.  Patient Profile     83 y.o.femalewith recent PPM placement on 7/24 Developed SOB a couple days later Echo: LVEF normal Triv effusion LE doppler neg for DVT (sl swelling). Admitted on 7/28.   Assessment & Plan     1. Hypertensive Emergency:  Underwent cardiac catheterization, essentially normal coronary arteries with a 60% mid LAD. Mildly elevated LVEDP.  Elevated troponin suspected to be related to demand ischemia and hypertension. Medical therapy is recommended. -- patients blood pressure is now controlled, continue current regimen -- Norvasc 2.5 mg, Enalapril 20mg , HCTZ 25 mg, Metoprolol 25 mg,   2. PPM: Interrogated  Functioning normally  No arrhythmias     We will arrange a follow up in our clinic within 7-14 days with BP check up and repeat CMP and lipids.  Discharge today.  Tobias AlexanderKatarina Rami Budhu, MD

## 2017-03-17 NOTE — Clinical Social Work Placement (Signed)
   CLINICAL SOCIAL WORK PLACEMENT  NOTE  Date:  03/17/2017  Patient Details  Name: Sharon Gilmore MRN: [id number] Date of Birth: 1934-05-24  Clinical Social Work is seeking post-discharge placement for this patient at the Skilled  Nursing Facility level of care (*CSW will initial, date and re-position this form in  chart as items are completed):  No ([facility] going [facility])   Patient/family provided with [facility] Clinical Social Work Department's list of facilities offering this level of care within the geographic area requested by the patient (or if unable, by the patient's family).  Yes   Patient/family informed of their freedom to choose among providers that offer the needed level of care, that participate in Medicare, Medicaid or managed care program needed by the patient, have an available bed and are willing to accept the patient.  No   Patient/family informed of [facility]'s ownership interest in [facility] and [facility], as well as of the fact that they are under no obligation to receive care at these facilities.  PASRR submitted to EDS on 03/16/17     PASRR number received on 03/16/17     Existing PASRR number confirmed on       FL2 transmitted to all facilities in geographic area requested by pt/family on 03/16/17     FL2 transmitted to all facilities within larger geographic area on       Patient informed that his/her managed care company has contracts with or will negotiate with certain facilities, including the following:        Yes   Patient/family informed of bed offers received.  [facility]     Physician recommends and patient chooses bed at      Patient to [facility] on 03/17/17.  Patient to be transferred to facility by [facility] or Bill  Patient family notified on 03/16/17 of transfer.  Name of family member notified:   Sons Bill and [name] at the bedside    PHYSICIAN       Additional Comment:    _______________________________________________ Cristobal Goldmannrawford, Landrum Carbonell Bradley, LCSW 03/17/2017, 2:34 PM

## 2017-03-17 NOTE — Progress Notes (Signed)
Pt discharge instructions given.  Pt verbalized understanding. VSS. Denies pain.  Pt left floor via wheelchair accompanied by staff and family to [facility].  Attempted to call [facility] to [facility], went to voicemail.

## 2017-03-17 NOTE — Discharge Summary (Signed)
Physician Discharge Summary  Patient ID: Sharon Gilmore MRN: [id number] DOB/AGE: 1934-06-23 83 y.o.  Admit date: 03/12/2017 Discharge date: 03/17/2017  Issues to follow up on: 1)  Patient should not get NSAID"s/ ASA in the next week or so as she is approaching back surgery 2)  Patient has flare up in R hand of one joint , hx of osteoarthritis.  Can use OTC tylenol but avoiding nsaids w/ upcoming surgery, please reassess on arrival to SNF.    Admission Diagnoses: 1 HTNsive urgency 2 NSTEMI / ^'d troponin 3 Dyspnea 4 Recent 2nd degree heart block sp PPM 5 Spinal stenosis w/ leg pain and numbness  Discharge Diagnoses:  1 HTNsive urgency 2 NSTEMI / ^'d troponin 3 Dyspnea 4 Recent 2nd degree heart block sp PPM 5 Spinal stenosis w/ leg pain and numbness   Discharged Condition: good  Hospital Course: 1)  HTNsive urgency - pt was rx'd with IV and po BP meds.  Metoprolol was added 25 mg qid and HCTZ was increased 12.5 to 25 mg per day.  Vasotec 20/ d was not changed. BP's improved to normal.   2)  SOB/ Elsie Saas^trop / NSTEMI - underwent LHCath showing only 60% LAD lesion and all other vessels were clean.  LVEF wnl.   3)  2nd degree heart block - had recent PPM placed 4)  Spinal stenosis - w/ leg pain and numbness.  Dr Venetia MaxonStern is planning back surgery soon.      Consults: cardiology  Significant Diagnostic Studies: angiography  Treatments: BP medications added  Discharge Exam: Blood pressure (!) 134/54, pulse 75, temperature 97.9 F (36.6 C), temperature source Oral, resp. rate 18, height 5\' 4"  (1.626 m), weight 83 kg (182 lb 15.7 oz), SpO2 95 %. Alert, no distress up in chair No jvd Chest cta bilat, PPM upper chest RRR no mrg Abd soft ntnd  Ext no LE edema NF, Ox 3  Disposition: 01-Skilled Nursing Facility   Allergies as of 03/17/2017      Reactions   Celebrex [celecoxib]    LEG ACHING   Codeine Nausea And Vomiting   Meloxicam    TUMMY ACHE, INEFFECTIVE   Nortriptyline  Other (See Comments)   unknown   Percocet [oxycodone-acetaminophen]    unknown   Penicillins Rash   Has patient had a PCN reaction causing immediate rash, facial/tongue/throat swelling, SOB or lightheadedness with hypotension: No Has patient had a PCN reaction causing severe rash involving mucus membranes or skin necrosis: No Has patient had a PCN reaction that required hospitalization No Has patient had a PCN reaction occurring within the last 10 years: No If all of the above answers are "NO", then may proceed with Cephalosporin use.      Medication List    STOP taking these medications   ALEVE 220 MG tablet Generic drug:  naproxen sodium     TAKE these medications   acetaminophen 325 MG tablet Commonly known as:  TYLENOL Take 2 tablets (650 mg total) by mouth every 4 (four) hours as needed for headache or mild pain.   aspirin EC 81 MG tablet Take 81 mg by mouth at bedtime.   atorvastatin 20 MG tablet Commonly known as:  LIPITOR Take 1 tablet (20 mg total) by mouth daily at 6 PM.   DRY EYES OP Place 1 drop into both eyes daily as needed (dry eye).   enalapril 20 MG tablet Commonly known as:  VASOTEC Take 20 mg by mouth daily.   gabapentin 100 MG  capsule Commonly known as:  NEURONTIN Take 3 capsules (300 mg total) by mouth at bedtime.   hydrochlorothiazide 25 MG tablet Commonly known as:  HYDRODIURIL Take 1 tablet (25 mg total) by mouth daily. What changed:  medication strength  how much to take  when to take this   lidocaine 4 % cream Commonly known as:  LMX Apply 1 application topically as needed.   metoprolol tartrate 25 MG tablet Commonly known as:  LOPRESSOR Take 1 tablet (25 mg total) by mouth 4 (four) times daily.   OXcarbazepine 150 MG tablet Commonly known as:  TRILEPTAL Take 1 tablet (150 mg total) by mouth 2 (two) times daily. What changed:  when to take this   PRESERVISION AREDS 2 Caps Take 1 capsule by mouth 2 (two) times daily.    traMADol 50 MG tablet Commonly known as:  ULTRAM Take 50 mg by mouth every 6 (six) hours as needed for moderate pain.   Vitamin D3 2000 units Tabs Take 1 tablet by mouth daily.      Follow-up Information    Olivia CanterKelly, William, MD. Schedule an appointment as soon as possible for a visit in 1 week(s).   Specialty:  Family Medicine Contact information: [address] [place] [address] [phone]        Sheilah PigeonUrsuy, Renee Lynn, PA-C Follow up on 03/23/2017.   Specialty:  Cardiology Why:  Your appointment time is at 11am. Please arrive 15 minutes early to your appointment. Contact information: [address] [place] [address] [phone]           Signed: Maree KrabbeSCHERTZ,Sharicka Pogorzelski D 03/17/2017, 12:36 PM

## 2017-03-18 ENCOUNTER — Telehealth: Payer: Self-pay | Admitting: Physician Assistant

## 2017-03-18 NOTE — Telephone Encounter (Signed)
Fayrene FearingJames - Dr. Venetia MaxonStern received a copy of this patient's discharge summary and would like to discuss her recent cardiac status with you with regards to her upcoming possible back surgery. He paged the Trish pager this evening, said it's OK if you can give him a call back when you can. His number is [phone]. Thanks. Kazia Grisanti PA-C

## 2017-03-21 NOTE — Progress Notes (Signed)
Cardiology Office Note Date:  03/22/2017  Patient ID:  Sharon BlakesJudith A Gilmore, DOB 1933-10-19, MRN [id number] PCP:  Olivia CanterKelly, William, MD  Cardiologist:  Dr. Delton SeeNelson Electrophysiologist: Dr. Johney FrameAllred   Chief Complaint: post-hospital stay  History of Present Illness: Sharon EvaJudith A Gilmore is a 83 y.o. 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 Johney FrameAllred.  She was most recently seen by cardioloy service at a recent hospital stay. She had c/o ongoing DOE/SOB (unchenged from prior to her PPM) and HTN, CXR post pacer was ne for ptx, CT r/o PE, PPM interrogation was reported in notes to show normal function, and given elevated Trop underwent LHC noting 60% LAD to be managed medically.  Troponin elevation suspect 2/2 demand ischemia with HTN urgency.   She was discharged 03/17/17  [facility], though currently in the rehab post hospital stay.  She feel like she is doing pretty well, has days she feels like her energy may not be as good as other days.  Her PT seems to really be 2/2 to her back/LE neuropathy issues.  She was initially scheduled for back surgery later this week, and inquires as to weather we feel like this is still a good idea.  She comes today accompanied by her daughter-in-law.  She denies any CP or palpitations, no SOB, symptoms of PND or orthopnea, no dizziness, near syncope or syncope.  Her cath site had no complications.  She is a retired Charity fundraiserN, mentions taking the lopressor QID is quite inconvenient and asks if we can dose this differently.  She reports BP 109/57-143/68 lowest and highest since discharge.   Device information: MDT dual chamber PPM implanted 03/09/17, Dr. Johney FrameAllred, high degree AVBlock   Past Medical History:  Diagnosis Date  . Abnormal barium swallow 09/1999  . Asthma   . Carpal tunnel syndrome   . Erythema nodosum   . H/O Doppler  ultrasound    LEFT LEG 11/1997  . Hypertension   . Impaired fasting glucose   . Psoriasis   . Rosacea   . Seizures (HCC) 1980  . Swallowing disorder   . Systolic murmur     Past Surgical History:  Procedure Laterality Date  . CHOLECYSTECTOMY    . COLONOSCOPY  2004  . INCISIONAL HERNIA REPAIR    . LEFT HEART CATH AND CORONARY ANGIOGRAPHY N/A 03/15/2017   Procedure: Left Heart Cath and Coronary Angiography;  Surgeon: Runell GessBerry, Jonathan J, MD;  [facility];  Service: Cardiovascular;  Laterality: N/A;  . PACEMAKER IMPLANT N/A 03/09/2017   Procedure: Pacemaker Implant;  Surgeon: Hillis RangeAllred, James, MD;  Location: [facility] IN[facility]AS[facility] CV LAB;  Service: Cardiovascular;  Laterality: N/A;  . TONSILLECTOMY      Current Outpatient Prescriptions  Medication Sig Dispense Refill  . acetaminophen (TYLENOL) 325 MG tablet Take 2 tablets (650 mg total) by mouth every 4 (four) hours as needed for headache or mild pain.    . Artificial Tear Ointment (DRY EYES OP) Place 1 drop into both eyes daily as needed (dry eye).    Marland Kitchen. aspirin EC 81 MG tablet Take 81 mg by mouth at bedtime.     . Cholecalciferol (VITAMIN D3) 2000 units TABS Take 1 tablet by mouth daily.    . enalapril (VASOTEC) 20 MG tablet Take 20 mg by mouth daily.    Marland Kitchen. gabapentin (NEURONTIN) 100 MG capsule Take  3 capsules (300 mg total) by mouth at bedtime. 90 capsule 6  . hydrochlorothiazide (HYDRODIURIL) 25 MG tablet Take 1 tablet (25 mg total) by mouth daily. 60 tablet 2  . metoprolol tartrate (LOPRESSOR) 50 MG tablet Take 1 tablet (50 mg total) by mouth 2 (two) times daily. 60 tablet 6  . Multiple Vitamins-Minerals (PRESERVISION AREDS PO) Take by mouth 2 (two) times daily.    . traMADol (ULTRAM) 50 MG tablet Take 50 mg by mouth every 6 (six) hours as needed for moderate pain.     No current facility-administered medications for this visit.     Allergies:   Celebrex [celecoxib]; Codeine; Meloxicam; Nortriptyline; Percocet  [oxycodone-acetaminophen]; and Penicillins   Social History:  The patient  reports that she quit smoking about 36 years ago. She has never used smokeless tobacco. She reports that she drinks alcohol. She reports that she does not use drugs.   Family History:  The patient's family history includes Breast cancer in her paternal grandmother; CVA in her maternal grandmother, mother, and paternal grandfather; Coronary artery disease in her brother; Diabetes in her paternal grandmother; Leukemia in her brother; Lung cancer in her paternal uncle; Pancreatic cancer in her brother and paternal aunt; Parkinson's disease in her father; Peripheral vascular disease in her maternal grandfather; Stroke in her maternal grandmother, mother, paternal aunt, and paternal grandfather.  ROS:  Please see the history of present illness.    All other systems are reviewed and otherwise negative.   PHYSICAL EXAM:  VS:  BP 126/68   Pulse 60   Ht 5\' 4"  (1.626 m)   Wt 180 lb (81.6 kg)   BMI 30.90 kg/m  BMI: Body mass index is 30.9 kg/m. Well nourished, well developed, in no acute distress  HEENT: normocephalic, atraumatic  Neck: no JVD, carotid bruits or masses Cardiac:  RRR; no significant murmurs, no rubs, or gallops Lungs:  CTA b/l, no wheezing, rhonchi or rales  Abd: soft, nontender MS: no deformity, age approprate atrophy Ext: no edema R radial site is stable, bounding radial pulse, no hematoma, bruising Skin: warm and dry, no rash Neuro:  No gross deficits appreciated Psych: euthymic mood, full affect  PPM site is stable, steri-strips are removed, incision is well healed, edges well approximated, no skin changes, erythema or edema, no tethering or discomfort, site looks good, well healed   EKG:  Done 03/12/17 SR, V paced PPM interrogation done today and reviewed by myself: battery and lead measurements looks stable, acute implant settings/outputs remain, 99.8% Vpaced, no AMS   Cardiac Catheterization  03/15/2017 IMPRESSION:Essentially normal coronary arteries with a 60% mid LAD after a moderate size diagonal branch probably of no hemodynamic significance. The LVEDP was mildly elevated as was her blood pressure. I suspect her mildly elevated troponin was from demand ischemia and hypertension. Medical therapy will be recommended. The sheath was removed and a TR band was placed on the right wrist to achieve patent hemostasis. The patient left the lab in stable condition.   Transthoracic Echocardiogram 03/13/2017 Study Conclusions - Left ventricle: The cavity size was normal. Wall thickness was increased in a pattern of mild LVH.Systolic function was normal. The estimated ejection fraction was in the range of 55% to 60%. Doppler parameters are consistent with both elevated ventricular end-diastolic filling pressure and elevated left atrial filling pressure. - Aortic valve: Valve area (Vmax): 2.59 cm^2. - Mitral valve: Valve area by continuity equation (using LVOT flow): 2.07 cm^2. - Left atrium: The atrium was mildly dilated. -  Atrial septum: No defect or patent foramen ovale was identified. - Pericardium, extracardiac: A trivial pericardial effusion was identified posterior to the heart.   06/09/16: stress myoview  Nuclear stress EF: 68%.  There was no ST segment deviation noted during stress.  The study is normal.  The left ventricular ejection fraction is hyperdynamic (>65%). Normal stress nuclear study with no ischemia or infarction; EF 68 with normal wall motion.   Recent Labs: 03/12/2017: B Natriuretic Peptide 161.4 03/13/2017: ALT 39; Magnesium 2.0; TSH 2.010 03/16/2017: BUN 9; Creatinine, Ser 0.55; Hemoglobin 12.0; Platelets 236; Potassium 3.7; Sodium 134  03/09/2017: Cholesterol 111; HDL 47; LDL Cholesterol 50; Total CHOL/HDL Ratio 2.4; Triglycerides 69; VLDL 14   Estimated Creatinine Clearance: 58 mL/min (by C-G formula based on SCr of 0.55 mg/dL).   Wt  Readings from Last 3 Encounters:  03/22/17 180 lb (81.6 kg)  03/16/17 182 lb 15.7 oz (83 kg)  03/08/17 187 lb (84.8 kg)     Other studies reviewed: Additional studies/records reviewed today include: summarized above  ASSESSMENT AND PLAN:  1. High degree heart block s/p PPM     Implant site looks good, no findings to suggest infection     Acute implant outputs remain programmed  2. HTN, recent hypertensive urgency     Felt by records to be the most likely etiology of her elevated Trop and presentation     Looks good, no objection to change her lopressor from 25mg  QID to 50mg  BID, she is asked to continue to monitor her BP daily   3. Non-obstructive CAD by cath     She declined Lipitor stating her labs have never been elevated historically and has reservations about statins     We discussed she does have (while not obstructive) CAD and the role statin plays not only in LD lowering but plaque stabilization      She will go ahead and take it, letting us know if any symptoms  4. Pending back surgery     Dr. Johney FrameAllred discussed with Dr. Venetia MaxonStern, they feel the best thing is to hold off her surgery until early next month given hospitalization      The patient is happy to hear this and will get in contact with Dr. Fredrich BirksStern's office   Disposition: Will change her lopressor as discussed above, she will keep an eye on her BP (they are while in rehab) and should her BP start to creep up will let us know.  Discussed with Dr. Johney FrameAllred, plan is to have her back in 2 weeks, sooner if needed.  If she is doing well and BP stable at her next visit will plan to let her proceed to surgery in September.  Current medicines are reviewed at length with the patient today.  The patient did not have any concerns regarding medicines.  Brentlee BlonderSigned, Blanca Thornton Ursy, PA-C 03/22/2017 5:32 PM     [facility] [address] [place] [address] [phone] (office)  [phone] (fax)

## 2017-03-22 ENCOUNTER — Ambulatory Visit: Payer: Medicare Other

## 2017-03-22 ENCOUNTER — Ambulatory Visit (INDEPENDENT_AMBULATORY_CARE_PROVIDER_SITE_OTHER): Payer: Medicare Other | Admitting: Physician Assistant

## 2017-03-22 VITALS — BP 126/68 | HR 60 | Ht 64.0 in | Wt 180.0 lb

## 2017-03-22 DIAGNOSIS — I441 Atrioventricular block, second degree: Secondary | ICD-10-CM | POA: Diagnosis not present

## 2017-03-22 DIAGNOSIS — I2510 Atherosclerotic heart disease of native coronary artery without angina pectoris: Secondary | ICD-10-CM

## 2017-03-22 DIAGNOSIS — Z950 Presence of cardiac pacemaker: Secondary | ICD-10-CM

## 2017-03-22 DIAGNOSIS — I160 Hypertensive urgency: Secondary | ICD-10-CM | POA: Diagnosis not present

## 2017-03-22 MED ORDER — METOPROLOL TARTRATE 50 MG PO TABS: 50.0000 mg | ORAL_TABLET | Freq: Two times a day (BID) | ORAL | 6 refills | Status: DC

## 2017-03-22 NOTE — Patient Instructions (Addendum)
Medication Instructions:    START TAKING LOPRESSOR 50 MG TWICE A DAY   If you need a refill on your cardiac medications before your next appointment, please call your pharmacy.  Labwork: NONE ORDERED  TODAY]   Testing/Procedures: NONE ORDERED  TODAY    Follow-Up:  IN 2 WEEK WITH URSUY  Any Other Special Instructions Will Be Listed Below (If Applicable).

## 2017-03-23 ENCOUNTER — Ambulatory Visit: Payer: Medicare Other | Admitting: Physician Assistant

## 2017-03-25 NOTE — Telephone Encounter (Signed)
Discussed with Dr Venetia MaxonStern

## 2017-04-06 NOTE — Progress Notes (Signed)
Cardiology Office Note Date:  04/07/2017  Patient ID:  Sharon Gilmore, Sharon Gilmore 03-25-1934, MRN [id number] PCP:  Olivia Canter, MD  Cardiologist:  Dr. Delton See Electrophysiologist: Dr. Johney Frame   Chief Complaint: post-hospital stay  History of Present Illness: Sharon Gilmore is a 83 y.o. 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 Johney Frame.  She was most recently seen by cardioloy service at a recent hospital stay. She had c/o ongoing DOE/SOB (uncheaged from prior to her PPM) and HTN, CXR post pacer was ne for ptx, CT r/o PE, PPM interrogation was reported in notes to show normal function, and given elevated Trop underwent LHC noting 60% LAD to be managed medically.  Troponin elevation suspect 2/2 demand ischemia with HTN urgency.   She was discharged 03/17/17  She is a resident at [facility], though currently in the rehab post hospital stay.  She was seen by myself 2 weeks ago in conjunction with Dr. Johney Frame, she felt like she wass doing pretty well, has days she feels like her energy may not be as good as other days.  Her PT seems to really be 2/2 to her back/LE neuropathy issues.   She denied any CP or palpitations, no SOB, symptoms of PND or orthopnea, no dizziness, near syncope or syncope.  Her cath site had no complications.  She is a retired [employer] fund[employer], mentioned taking the lopressor QID is quite inconvenient and asks if we can dose this differently.  She reports BP 109/57-143/68 lowest and highest since discharge, her :Lopressor was changed to 50mg  BID, with plans to delay back surgery until early September and see her back in a couple weeks.  If stable, and doing well, would plan to pursue her back surgery.  Her BP has been well controlled, and she is tolerating the statin without side effects/myalgias.  She mentions she thinks she may be depressed.  While physically she is  able to do what she wants/needs to without symptoms, she doesn't feel like she has the ambition to do anything.  Just not interested in doing anything any more.  She feels this goes back a couple months likely and attributes it to frustration regarding her back issue, leg pain.  She is tired of the amount of effort it takes/pain associated with her back/leg, and this has thrown her for a loop.  We spoke at length, she denies any kind of CP, palpitations or SOB, no DOE, she ambulates, was able to do her PT, walks to dinner, bingo, and denies any physical symptoms outside of her leg/back.  She denies any dizziness, near syncope or syncope.  She sleeps well.   Device information: MDT dual chamber PPM implanted 03/09/17, Dr. Johney Frame, high degree AVBlock   Past Medical History:  Diagnosis Date  . Abnormal barium swallow 09/1999  . Asthma   . Carpal tunnel syndrome   . Erythema nodosum   . H/O Doppler ultrasound    LEFT LEG 11/1997  . Hypertension   . Impaired fasting glucose   . Psoriasis   . Rosacea   . Seizures (HCC) 1980  . Swallowing disorder   . Systolic murmur     Past Surgical History:  Procedure Laterality Date  . CHOLECYSTECTOMY    . COLONOSCOPY  2004  . INCISIONAL HERNIA REPAIR    . LEFT HEART CATH AND CORONARY ANGIOGRAPHY N/A 03/15/2017   Procedure: Left Heart  Cath and Coronary Angiography;  Surgeon: Runell Gess, MD;  Location: [facility] INVASIVE CV LAB;  Service: Cardiovascular;  Laterality: N/A;  . PACEMAKER IMPLANT N/A 03/09/2017   Procedure: Pacemaker Implant;  Surgeon: Hillis Range, MD;  Location: [facility] INVASIVE CV LAB;  Service: Cardiovascular;  Laterality: N/A;  . TONSILLECTOMY      Current Outpatient Prescriptions  Medication Sig Dispense Refill  . acetaminophen (TYLENOL) 325 MG tablet Take 2 tablets (650 mg total) by mouth every 4 (four) hours as needed for headache or mild pain.    . Artificial Tear Ointment (DRY EYES OP) Place 1 drop into both eyes daily as needed (dry  eye).    Marland Kitchen aspirin EC 81 MG tablet Take 81 mg by mouth at bedtime.     Marland Kitchen atorvastatin (LIPITOR) 20 MG tablet Take 20 mg by mouth daily.    . Cholecalciferol (VITAMIN D3) 2000 units TABS Take 1 tablet by mouth daily.    . enalapril (VASOTEC) 20 MG tablet Take 20 mg by mouth daily.    Marland Kitchen gabapentin (NEURONTIN) 100 MG capsule Take 3 capsules (300 mg total) by mouth at bedtime. 90 capsule 6  . hydrochlorothiazide (HYDRODIURIL) 25 MG tablet Take 1 tablet (25 mg total) by mouth daily. 60 tablet 2  . metoprolol tartrate (LOPRESSOR) 50 MG tablet Take 1 tablet (50 mg total) by mouth 2 (two) times daily. 60 tablet 6  . Multiple Vitamins-Minerals (PRESERVISION AREDS PO) Take by mouth 2 (two) times daily.    . traMADol (ULTRAM) 50 MG tablet Take 50 mg by mouth every 6 (six) hours as needed for moderate pain.     No current facility-administered medications for this visit.     Allergies:   Celebrex [celecoxib]; Codeine; Meloxicam; Nortriptyline; Percocet [oxycodone-acetaminophen]; and Penicillins   Social History:  The patient  reports that she quit smoking about 36 years ago. She has never used smokeless tobacco. She reports that she drinks alcohol. She reports that she does not use drugs.   Family History:  The patient's family history includes Breast cancer in her paternal grandmother; CVA in her maternal grandmother, mother, and paternal grandfather; Coronary artery disease in her brother; Diabetes in her paternal grandmother; Leukemia in her brother; Lung cancer in her paternal uncle; Pancreatic cancer in her brother and paternal aunt; Parkinson's disease in her father; Peripheral vascular disease in her maternal grandfather; Stroke in her maternal grandmother, mother, paternal aunt, and paternal grandfather.  ROS:  Please see the history of present illness.    All other systems are reviewed and otherwise negative.   PHYSICAL EXAM:  VS:  BP 118/62   Pulse 61   Ht 5\' 4"  (1.626 m)   Wt 179 lb 6.4 oz  (81.4 kg)   SpO2 95%   BMI 30.79 kg/m  BMI: Body mass index is 30.79 kg/m. Well nourished, well developed, in no acute distress  HEENT: normocephalic, atraumatic  Neck: no JVD, carotid bruits or masses Cardiac:  RRR; no significant murmurs, no rubs, or gallops Lungs:  CTA b/l, no wheezing, rhonchi or rales  Abd: soft, nontender MS: no deformity, age approprate atrophy Ext: no edema  Skin: warm and dry, no rash Neuro:  No gross deficits appreciated Psych: euthymic mood, full affect  PPM site is stable, st is well healed, no skin changes, erythema or edema, no tethering or discomfort, site looks good.   EKG:  Done 03/12/17 SR, V paced PPM interrogation done today and reviewed by myself: battery and lead measurements  looks stable, acute implant settings/outputs remain, 99.8% Vpaced, no AMS, no programming changes made   Cardiac Catheterization 03/15/2017 IMPRESSION:Essentially normal coronary arteries with a 60% mid LAD after a moderate size diagonal branch probably of no hemodynamic significance. The LVEDP was mildly elevated as was her blood pressure. I suspect her mildly elevated troponin was from demand ischemia and hypertension. Medical therapy will be recommended. The sheath was removed and a TR band was placed on the right wrist to achieve patent hemostasis. The patient left the lab in stable condition.   Transthoracic Echocardiogram 03/13/2017 Study Conclusions - Left ventricle: The cavity size was normal. Wall thickness was increased in a pattern of mild LVH.Systolic function was normal. The estimated ejection fraction was in the range of 55% to 60%. Doppler parameters are consistent with both elevated ventricular end-diastolic filling pressure and elevated left atrial filling pressure. - Aortic valve: Valve area (Vmax): 2.59 cm^2. - Mitral valve: Valve area by continuity equation (using LVOT flow): 2.07 cm^2. - Left atrium: The atrium was mildly dilated. -  Atrial septum: No defect or patent foramen ovale was identified. - Pericardium, extracardiac: A trivial pericardial effusion was identified posterior to the heart.   06/09/16: stress myoview  Nuclear stress EF: 68%.  There was no ST segment deviation noted during stress.  The study is normal.  The left ventricular ejection fraction is hyperdynamic (>65%). Normal stress nuclear study with no ischemia or infarction; EF 68 with normal wall motion.   Recent Labs: 03/12/2017: B Natriuretic Peptide 161.4 03/13/2017: ALT 39; Magnesium 2.0; TSH 2.010 03/16/2017: BUN 9; Creatinine, Ser 0.55; Hemoglobin 12.0; Platelets 236; Potassium 3.7; Sodium 134  03/09/2017: Cholesterol 111; HDL 47; LDL Cholesterol 50; Total CHOL/HDL Ratio 2.4; Triglycerides 69; VLDL 14   CrCl cannot be calculated (Patient's most recent lab result is older than the maximum 21 days allowed.).   Wt Readings from Last 3 Encounters:  04/07/17 179 lb 6.4 oz (81.4 kg)  03/22/17 180 lb (81.6 kg)  03/16/17 182 lb 15.7 oz (83 kg)     Other studies reviewed: Additional studies/records reviewed today include: summarized above  ASSESSMENT AND PLAN:  1. High degree heart block s/p PPM     Implant site looks good, well healed     Acute implant outputs remain programmed, no changes made  2. HTN, recent hypertensive urgency     Felt by records to be the most likely etiology of her elevated Trop and presentation     Looks good, she brings hhome record also looks OK  3. Non-obstructive CAD by cath     She declined Lipitor stating her labs have never been elevated historically and has reservations about statins     We discussed she does have (while not obstructive) CAD and the role statin plays not only in LD lowering but plaque stabilization      She is tolerating statin  4. Pending back surgery     Dr. Johney Frame discussed with Dr. Venetia Maxon, they feel the best thing is to hold off her surgery until early next month given  hospitalization      Rescheduled for Sept 7.   Disposition: Cardia-wise she is doing well, she will need routine peri-operative pacemaker management, she wil talk with her PMD regarding her concerns regarding depression and lack of interest in activities, though encoraged about her back surgery and hoping this gets her back towards "normal".  WE will see her at her planned 3 month post-implant visit, sooner if needed.  Current  medicines are reviewed at length with the patient today.  The patient did not have any concerns regarding medicines.  Pamela Blonder, PA-C 04/07/2017 12:41 PM     [facility] [address] [place] [address] [phone] (office)  [phone] (fax)

## 2017-04-07 ENCOUNTER — Encounter: Payer: Self-pay | Admitting: Physician Assistant

## 2017-04-07 ENCOUNTER — Ambulatory Visit (INDEPENDENT_AMBULATORY_CARE_PROVIDER_SITE_OTHER): Payer: Medicare Other | Admitting: Physician Assistant

## 2017-04-07 VITALS — BP 118/62 | HR 61 | Ht 64.0 in | Wt 179.4 lb

## 2017-04-07 DIAGNOSIS — I10 Essential (primary) hypertension: Secondary | ICD-10-CM

## 2017-04-07 DIAGNOSIS — Z950 Presence of cardiac pacemaker: Secondary | ICD-10-CM

## 2017-04-07 DIAGNOSIS — I2510 Atherosclerotic heart disease of native coronary artery without angina pectoris: Secondary | ICD-10-CM

## 2017-04-07 NOTE — Patient Instructions (Signed)
Medication Instructions:  Your physician recommends that you continue on your current medications as directed. Please refer to the Current Medication list given to you today.   Labwork: None Ordered   Testing/Procedures: None Ordered   Follow-Up: Your physician recommends that you schedule a follow-up appointment in: please keep appointment that you have scheduled in October for Dr. Johney Frame.   Any Other Special Instructions Will Be Listed Below (If Applicable).     If you need a refill on your cardiac medications before your next appointment, please call your pharmacy.

## 2017-04-13 NOTE — Pre-Procedure Instructions (Signed)
ASHLAND HUTH  04/13/2017      [id number] - [place], [place] - [address]OSS [address] [place] [address] Phone: [phone] Fax: [phone]    Your procedure is scheduled on   Friday  04/23/17  Report to [facility] Admitting at 1100 A.M.  Call this number if you have problems the morning of surgery:  [phone]   Remember:  Do not eat food or drink liquids after midnight.  Take these medicines the morning of surgery with A SIP OF WATER  EYE DROPS, METOPROLOL (LOPRESSOR), TRAMADOL IF NEEDED  7 days prior to surgery STOP taking any Aspirin, Aleve, Naproxen, Ibuprofen, Motrin, Advil, Goody's, BC's, all herbal medications, fish oil, and all vitamins   Do not wear jewelry, make-up or nail polish.  Do not wear lotions, powders, or perfumes, or deoderant.  Do not shave 48 hours prior to surgery.  Men may shave face and neck.  Do not bring valuables to the hospital.  [facility] is not responsible for any belongings or valuables.  Contacts, dentures or bridgework may not be worn into surgery.  Leave your suitcase in the car.  After surgery it may be brought to your room.  For patients admitted to the hospital, discharge time will be determined by your treatment team.  Patients discharged the day of surgery will not be allowed to drive home.   Name and phone number of your driver:    Special instructions:  [facility] - Preparing for Surgery  Before surgery, you can play an important role.  Because skin is not sterile, your skin needs to be as free of germs as possible.  You can reduce the number of germs on you skin by washing with CHG (chlorahexidine gluconate) soap before surgery.  CHG is an antiseptic cleaner which kills germs and bonds with the skin to continue killing germs even after washing.  Please DO NOT use if you have an allergy to CHG or antibacterial soaps.  If your skin becomes  reddened/irritated stop using the CHG and inform your nurse when you arrive at Short Stay.  Do not shave (including legs and underarms) for at least 48 hours prior to the first CHG shower.  You may shave your face.  Please follow these instructions carefully:   1.  Shower with CHG Soap the night before surgery and the                                morning of Surgery.  2.  If you choose to wash your hair, wash your hair first as usual with your       normal shampoo.  3.  After you shampoo, rinse your hair and body thoroughly to remove the                      Shampoo.  4.  Use CHG as you would any other liquid soap.  You can apply chg directly       to the skin and wash gently with scrungie or a clean washcloth.  5.  Apply the CHG Soap to your body ONLY FROM THE NECK DOWN.        Do not use on open wounds or open sores.  Avoid contact with your eyes,       ears, mouth and genitals (private parts).  Wash genitals (private parts)       with your normal soap.  6.  Wash thoroughly, paying special attention to the area where your surgery        will be performed.  7.  Thoroughly rinse your body with warm water from the neck down.  8.  DO NOT shower/wash with your normal soap after using and rinsing off       the CHG Soap.  9.  Pat yourself dry with a clean towel.            10.  Wear clean pajamas.            11.  Place clean sheets on your bed the night of your first shower and do not        sleep with pets.  Day of Surgery  Do not apply any lotions/deoderants the morning of surgery.  Please wear clean clothes to the hospital/surgery center.    Please read over the following fact sheets that you were given. MRSA Information and Surgical Site Infection Prevention

## 2017-04-14 ENCOUNTER — Encounter (HOSPITAL_COMMUNITY)
Admission: RE | Admit: 2017-04-14 | Discharge: 2017-04-14 | Disposition: A | Discharge status: Home / Self Care | Payer: Medicare Other | Source: Ambulatory Visit | Attending: Neurosurgery | Admitting: Neurosurgery

## 2017-04-14 ENCOUNTER — Encounter (HOSPITAL_COMMUNITY): Payer: Self-pay

## 2017-04-14 DIAGNOSIS — Z01818 Encounter for other preprocedural examination: Secondary | ICD-10-CM | POA: Insufficient documentation

## 2017-04-14 DIAGNOSIS — M48061 Spinal stenosis, lumbar region without neurogenic claudication: Secondary | ICD-10-CM | POA: Diagnosis not present

## 2017-04-14 HISTORY — DX: Depression, unspecified: F32.A

## 2017-04-14 HISTORY — DX: Unspecified osteoarthritis, unspecified site: M19.90

## 2017-04-14 HISTORY — DX: Major depressive disorder, single episode, unspecified: F32.9

## 2017-04-14 LAB — CBC
HEMATOCRIT: 36.9 % (ref 36.0–46.0)
HEMOGLOBIN: 11.8 g/dL — AB (ref 12.0–15.0)
MCH: 28.3 pg (ref 26.0–34.0)
MCHC: 32 g/dL (ref 30.0–36.0)
MCV: 88.5 fL (ref 78.0–100.0)
Platelets: 244 10*3/uL (ref 150–400)
RBC: 4.17 MIL/uL (ref 3.87–5.11)
RDW: 15.5 % (ref 11.5–15.5)
WBC: 8.5 10*3/uL (ref 4.0–10.5)

## 2017-04-14 LAB — BASIC METABOLIC PANEL
Anion gap: 8 (ref 5–15)
BUN: 11 mg/dL (ref 6–20)
CHLORIDE: 101 mmol/L (ref 101–111)
CO2: 26 mmol/L (ref 22–32)
CREATININE: 0.57 mg/dL (ref 0.44–1.00)
Calcium: 9.1 mg/dL (ref 8.9–10.3)
GFR calc non Af Amer: >60 mL/min (ref >60)
GLUCOSE: 124 mg/dL — AB (ref 65–99)
Potassium: 3.6 mmol/L (ref 3.5–5.1)
Sodium: 135 mmol/L (ref 135–145)

## 2017-04-14 LAB — TYPE AND SCREEN
ABO/RH(D): O POS
ANTIBODY SCREEN: NEGATIVE

## 2017-04-14 LAB — SURGICAL PCR SCREEN
MRSA, PCR: NEGATIVE
STAPHYLOCOCCUS AUREUS: NEGATIVE

## 2017-04-14 LAB — ABO/RH: ABO/RH(D): O POS

## 2017-04-14 NOTE — Progress Notes (Signed)
NOTIFIED MARCIA, [employer] REP OF PATIENT'S SURGERY 04/23/17 AT 1300.

## 2017-04-15 NOTE — Progress Notes (Signed)
Anesthesia Chart Review:  Pt is an 83 year old female scheduled for L2-5 decompression, fusion, possible interbodies on 04/23/2017 with Maeola HarmanJoseph Stern, MD  - PCP is Olivia CanterWilliam Kelly, MD - Cardiologist is Tobias AlexanderKatarina Nelson, MD - EP cardiologist is Hillis RangeJames Allred, MD who is aware of upcoming surgery.  Last office visit 04/07/17 with Francis Dowseenee Ursuy, PA  PMH includes:  HTN, high degree 2:1 AV block, pacemaker (Medtr[employer] inserted 03/09/17), asthma, impaired fasting glucose. Former smoker. BMI 31.  - Hospitalized 7/27-03/17/17 for hypertensive urgency, NSTEMI (due to increased troponin thought likely demand ischemia). Underwent cardiac cath, non-obstructive disease found.   Medications include: ASA 81 mg, Lipitor, enalapril, HCTZ, metoprolol  BP (!) 116/54   Pulse 61   Temp 36.8 C   Resp 18   Wt 180 lb 3 oz (81.7 kg)   SpO2 97%   BMI 30.93 kg/m   Preoperative labs reviewed.    CT angio chest 03/14/17:  - No evidence pulmonary embolus. - Ascending aorta measures 3.9 cm, upper limits of normal. - Mild calcific atherosclerotic disease of the coronary arteries. - Mild interstitial pulmonary edema with minimal bilateral pleural effusions. - Segmental atelectasis of the right lower lobe and subsegmental atelectasis of left lower lobe.  CXR 03/13/17:  - Shallow inspiration with linear atelectasis in the lung bases. Small bilateral pleural effusions. No consolidation or edema.  EKG 03/13/07: Atrial-sensed ventricular-paced rhythm  Cardiac cath 03/15/17:  - Essentially normal coronary arteries with a 60% mid LAD after a moderate size diagonal branch probably of no hemodynamic significance. The LVEDP was mildly elevated as was her blood pressure. I suspect her mildly elevated troponin was from demand ischemia and hypertension. Medical therapy will be recommended.   Echo 03/13/17:  - Left ventricle: The cavity size was normal. Wall thickness was increased in a pattern of mild LVH. Systolic function was normal. The  estimated ejection fraction was in the range of 55% to 60%. Doppler parameters are consistent with both elevated ventricular end-diastolic filling pressure and elevated left atrial filling pressure. - Aortic valve: Valve area (Vmax): 2.59 cm^2. - Mitral valve: Valve area by continuity equation (using LVOT flow): 2.07 cm^2. - Left atrium: The atrium was mildly dilated. - Atrial septum: No defect or patent foramen ovale was identified. - Pericardium, extracardiac: A trivial pericardial effusion was identified posterior to the heart.  Perioperative prescription for pacemaker form indicates procedure is likely to interfere with device function. Device should be programmed a synchronous pacing during procedure and returned to normal programming after procedure. PAT RN notified rep.  If no changes, I anticipate pt can proceed with surgery as scheduled.   Rica Mastngela Earlean Fidalgo, FNP-BC [facility] Phone: [phone] 04/15/2017 12:55 PM

## 2017-04-23 ENCOUNTER — Inpatient Hospital Stay (HOSPITAL_COMMUNITY): Payer: Medicare Other | Admitting: Certified Registered"

## 2017-04-23 ENCOUNTER — Inpatient Hospital Stay (HOSPITAL_COMMUNITY): Payer: Medicare Other

## 2017-04-23 ENCOUNTER — Inpatient Hospital Stay (HOSPITAL_COMMUNITY)
Admission: RE | Admit: 2017-04-23 | Discharge: 2017-04-27 | DRG: 460 | Disposition: A | Discharge status: Skilled Nursing Facility | Payer: Medicare Other | Attending: Neurosurgery | Admitting: Neurosurgery

## 2017-04-23 ENCOUNTER — Encounter (HOSPITAL_COMMUNITY)
Admission: RE | Disposition: A | Discharge status: Skilled Nursing Facility | Payer: Self-pay | Source: Home / Self Care | Attending: Neurosurgery

## 2017-04-23 ENCOUNTER — Encounter (HOSPITAL_COMMUNITY): Payer: Self-pay | Admitting: Urology

## 2017-04-23 ENCOUNTER — Inpatient Hospital Stay (HOSPITAL_COMMUNITY): Payer: Medicare Other | Admitting: Emergency Medicine

## 2017-04-23 DIAGNOSIS — Z886 Allergy status to analgesic agent status: Secondary | ICD-10-CM

## 2017-04-23 DIAGNOSIS — Z885 Allergy status to narcotic agent status: Secondary | ICD-10-CM | POA: Diagnosis not present

## 2017-04-23 DIAGNOSIS — R531 Weakness: Secondary | ICD-10-CM | POA: Diagnosis present

## 2017-04-23 DIAGNOSIS — Z880 Allergy status to penicillin: Secondary | ICD-10-CM

## 2017-04-23 DIAGNOSIS — M419 Scoliosis, unspecified: Secondary | ICD-10-CM | POA: Diagnosis present

## 2017-04-23 DIAGNOSIS — M5116 Intervertebral disc disorders with radiculopathy, lumbar region: Secondary | ICD-10-CM | POA: Diagnosis present

## 2017-04-23 DIAGNOSIS — M48061 Spinal stenosis, lumbar region without neurogenic claudication: Secondary | ICD-10-CM | POA: Diagnosis present

## 2017-04-23 DIAGNOSIS — Z7982 Long term (current) use of aspirin: Secondary | ICD-10-CM

## 2017-04-23 DIAGNOSIS — I10 Essential (primary) hypertension: Secondary | ICD-10-CM | POA: Diagnosis present

## 2017-04-23 DIAGNOSIS — Z6830 Body mass index (BMI) 30.0-30.9, adult: Secondary | ICD-10-CM | POA: Diagnosis not present

## 2017-04-23 DIAGNOSIS — R569 Unspecified convulsions: Secondary | ICD-10-CM | POA: Diagnosis present

## 2017-04-23 DIAGNOSIS — E669 Obesity, unspecified: Secondary | ICD-10-CM | POA: Diagnosis present

## 2017-04-23 DIAGNOSIS — Z87891 Personal history of nicotine dependence: Secondary | ICD-10-CM | POA: Diagnosis not present

## 2017-04-23 DIAGNOSIS — E559 Vitamin D deficiency, unspecified: Secondary | ICD-10-CM | POA: Diagnosis present

## 2017-04-23 DIAGNOSIS — J45909 Unspecified asthma, uncomplicated: Secondary | ICD-10-CM | POA: Diagnosis present

## 2017-04-23 DIAGNOSIS — Z888 Allergy status to other drugs, medicaments and biological substances status: Secondary | ICD-10-CM

## 2017-04-23 DIAGNOSIS — M21372 Foot drop, left foot: Secondary | ICD-10-CM | POA: Diagnosis present

## 2017-04-23 DIAGNOSIS — Z419 Encounter for procedure for purposes other than remedying health state, unspecified: Secondary | ICD-10-CM

## 2017-04-23 DIAGNOSIS — Z79899 Other long term (current) drug therapy: Secondary | ICD-10-CM | POA: Diagnosis not present

## 2017-04-23 SURGERY — POSTERIOR LUMBAR FUSION 2 LEVEL
Anesthesia: General | Site: Back

## 2017-04-23 MED ORDER — ZOLPIDEM TARTRATE 5 MG PO TABS: 5.0000 mg | ORAL_TABLET | Freq: Every evening | ORAL | Status: DC | PRN

## 2017-04-23 MED ORDER — METOPROLOL TARTRATE 50 MG PO TABS
50.0000 mg | ORAL_TABLET | Freq: Two times a day (BID) | ORAL | Status: DC
  Administered 2017-04-23 – 2017-04-27 (×7): 50 mg via ORAL
  Filled 2017-04-23 (×8): qty 1

## 2017-04-23 MED ORDER — ACETAMINOPHEN 650 MG RE SUPP: 650.0000 mg | RECTAL | Status: DC | PRN

## 2017-04-23 MED ORDER — GABAPENTIN 300 MG PO CAPS
300.0000 mg | ORAL_CAPSULE | Freq: Every day | ORAL | Status: DC
  Administered 2017-04-23 – 2017-04-25 (×3): 300 mg via ORAL
  Filled 2017-04-23 (×3): qty 1

## 2017-04-23 MED ORDER — PHENYLEPHRINE HCL 10 MG/ML IJ SOLN
INTRAVENOUS | Status: DC | PRN
  Administered 2017-04-23: 40 ug/min via INTRAVENOUS

## 2017-04-23 MED ORDER — DEXAMETHASONE SODIUM PHOSPHATE 10 MG/ML IJ SOLN
INTRAMUSCULAR | Status: DC | PRN
  Administered 2017-04-23: 10 mg via INTRAVENOUS

## 2017-04-23 MED ORDER — VITAMIN D 1000 UNITS PO TABS
2000.0000 [IU] | ORAL_TABLET | Freq: Every day | ORAL | Status: DC
  Administered 2017-04-23 – 2017-04-27 (×5): 2000 [IU] via ORAL
  Filled 2017-04-23 (×7): qty 2

## 2017-04-23 MED ORDER — FENTANYL CITRATE (PF) 100 MCG/2ML IJ SOLN
INTRAMUSCULAR | Status: DC | PRN
Start: 2017-04-23 — End: 2017-04-23
  Administered 2017-04-23: 50 ug via INTRAVENOUS
  Administered 2017-04-23: 125 ug via INTRAVENOUS
  Administered 2017-04-23: 25 ug via INTRAVENOUS
  Administered 2017-04-23: 50 ug via INTRAVENOUS
  Administered 2017-04-23: 25 ug via INTRAVENOUS
  Administered 2017-04-23: 75 ug via INTRAVENOUS

## 2017-04-23 MED ORDER — HYDROCHLOROTHIAZIDE 25 MG PO TABS
25.0000 mg | ORAL_TABLET | Freq: Every day | ORAL | Status: DC
  Administered 2017-04-24 – 2017-04-27 (×4): 25 mg via ORAL
  Filled 2017-04-23 (×5): qty 1

## 2017-04-23 MED ORDER — METHOCARBAMOL 500 MG PO TABS
500.0000 mg | ORAL_TABLET | Freq: Four times a day (QID) | ORAL | Status: DC | PRN
  Administered 2017-04-23 – 2017-04-27 (×9): 500 mg via ORAL
  Filled 2017-04-23 (×10): qty 1

## 2017-04-23 MED ORDER — METHOCARBAMOL 1000 MG/10ML IJ SOLN
500.0000 mg | Freq: Four times a day (QID) | INTRAVENOUS | Status: DC | PRN
  Filled 2017-04-23: qty 5

## 2017-04-23 MED ORDER — FENTANYL CITRATE (PF) 100 MCG/2ML IJ SOLN
INTRAMUSCULAR | Status: AC
  Filled 2017-04-23: qty 2

## 2017-04-23 MED ORDER — SENNOSIDES-DOCUSATE SODIUM 8.6-50 MG PO TABS: 1.0000 | ORAL_TABLET | Freq: Every evening | ORAL | Status: DC | PRN

## 2017-04-23 MED ORDER — LACTATED RINGERS IV SOLN
INTRAVENOUS | Status: DC
  Administered 2017-04-23: 11:00:00 via INTRAVENOUS

## 2017-04-23 MED ORDER — DOCUSATE SODIUM 100 MG PO CAPS
100.0000 mg | ORAL_CAPSULE | Freq: Two times a day (BID) | ORAL | Status: DC
  Administered 2017-04-23 – 2017-04-27 (×8): 100 mg via ORAL
  Filled 2017-04-23 (×8): qty 1

## 2017-04-23 MED ORDER — FENTANYL CITRATE (PF) 100 MCG/2ML IJ SOLN
INTRAMUSCULAR | Status: AC
  Administered 2017-04-23: 50 ug via INTRAVENOUS
  Filled 2017-04-23: qty 2

## 2017-04-23 MED ORDER — PROPOFOL 500 MG/50ML IV EMUL
INTRAVENOUS | Status: DC | PRN
  Administered 2017-04-23: 75 ug/kg/min via INTRAVENOUS

## 2017-04-23 MED ORDER — PHENYLEPHRINE 40 MCG/ML (10ML) SYRINGE FOR IV PUSH (FOR BLOOD PRESSURE SUPPORT)
PREFILLED_SYRINGE | INTRAVENOUS | Status: AC
  Filled 2017-04-23: qty 10

## 2017-04-23 MED ORDER — PROMETHAZINE HCL 25 MG/ML IJ SOLN: 6.2500 mg | INTRAMUSCULAR | Status: DC | PRN

## 2017-04-23 MED ORDER — THROMBIN 20000 UNITS EX SOLR
CUTANEOUS | Status: AC
  Filled 2017-04-23: qty 20000

## 2017-04-23 MED ORDER — PROPOFOL 10 MG/ML IV BOLUS
INTRAVENOUS | Status: AC
  Filled 2017-04-23: qty 20

## 2017-04-23 MED ORDER — CHLORHEXIDINE GLUCONATE CLOTH 2 % EX PADS: 6.0000 | MEDICATED_PAD | Freq: Once | CUTANEOUS | Status: DC

## 2017-04-23 MED ORDER — BISACODYL 10 MG RE SUPP
10.0000 mg | Freq: Every day | RECTAL | Status: DC | PRN
Start: 2017-04-23 — End: 2017-04-27

## 2017-04-23 MED ORDER — PROPOFOL 10 MG/ML IV BOLUS
INTRAVENOUS | Status: DC | PRN
  Administered 2017-04-23: 140 mg via INTRAVENOUS
  Administered 2017-04-23: 60 mg via INTRAVENOUS

## 2017-04-23 MED ORDER — FLEET ENEMA 7-19 GM/118ML RE ENEM: 1.0000 | ENEMA | Freq: Once | RECTAL | Status: DC | PRN

## 2017-04-23 MED ORDER — ONDANSETRON HCL 4 MG/2ML IJ SOLN
INTRAMUSCULAR | Status: AC
  Filled 2017-04-23: qty 2

## 2017-04-23 MED ORDER — LACTATED RINGERS IV SOLN
INTRAVENOUS | Status: DC | PRN
  Administered 2017-04-23 (×2): via INTRAVENOUS

## 2017-04-23 MED ORDER — LACTATED RINGERS IV SOLN: INTRAVENOUS | Status: DC

## 2017-04-23 MED ORDER — LIDOCAINE-EPINEPHRINE 1 %-1:100000 IJ SOLN
INTRAMUSCULAR | Status: DC | PRN
  Administered 2017-04-23: 5 mL

## 2017-04-23 MED ORDER — BUPIVACAINE LIPOSOME 1.3 % IJ SUSP
INTRAMUSCULAR | Status: DC | PRN
  Administered 2017-04-23: 20 mL

## 2017-04-23 MED ORDER — VANCOMYCIN HCL 10 G IV SOLR
1500.0000 mg | INTRAVENOUS | Status: DC
  Administered 2017-04-24 – 2017-04-25 (×2): 1500 mg via INTRAVENOUS
  Filled 2017-04-23 (×3): qty 1500

## 2017-04-23 MED ORDER — KCL IN DEXTROSE-NACL 20-5-0.45 MEQ/L-%-% IV SOLN
INTRAVENOUS | Status: DC
  Administered 2017-04-23: 21:00:00 via INTRAVENOUS
  Filled 2017-04-23 (×2): qty 1000

## 2017-04-23 MED ORDER — HYDROCODONE-ACETAMINOPHEN 5-325 MG PO TABS
ORAL_TABLET | ORAL | Status: AC
  Filled 2017-04-23: qty 2

## 2017-04-23 MED ORDER — ATORVASTATIN CALCIUM 10 MG PO TABS
20.0000 mg | ORAL_TABLET | Freq: Every day | ORAL | Status: DC
  Administered 2017-04-23 – 2017-04-26 (×4): 20 mg via ORAL
  Filled 2017-04-23 (×4): qty 2

## 2017-04-23 MED ORDER — SODIUM CHLORIDE 0.9% FLUSH: 3.0000 mL | INTRAVENOUS | Status: DC | PRN

## 2017-04-23 MED ORDER — ONDANSETRON HCL 4 MG/2ML IJ SOLN
INTRAMUSCULAR | Status: DC | PRN
  Administered 2017-04-23: 4 mg via INTRAVENOUS

## 2017-04-23 MED ORDER — MENTHOL 3 MG MT LOZG: 1.0000 | LOZENGE | OROMUCOSAL | Status: DC | PRN

## 2017-04-23 MED ORDER — SODIUM CHLORIDE 0.9 % IV SOLN: 250.0000 mL | INTRAVENOUS | Status: DC

## 2017-04-23 MED ORDER — PHENOL 1.4 % MT LIQD: 1.0000 | OROMUCOSAL | Status: DC | PRN

## 2017-04-23 MED ORDER — ENALAPRIL MALEATE 5 MG PO TABS
20.0000 mg | ORAL_TABLET | Freq: Every day | ORAL | Status: DC
  Administered 2017-04-24 – 2017-04-27 (×4): 20 mg via ORAL
  Filled 2017-04-23 (×5): qty 4

## 2017-04-23 MED ORDER — ASPIRIN EC 81 MG PO TBEC
81.0000 mg | DELAYED_RELEASE_TABLET | Freq: Every day | ORAL | Status: DC
  Administered 2017-04-23 – 2017-04-26 (×4): 81 mg via ORAL
  Filled 2017-04-23 (×5): qty 1

## 2017-04-23 MED ORDER — FENTANYL CITRATE (PF) 100 MCG/2ML IJ SOLN
25.0000 ug | INTRAMUSCULAR | Status: DC | PRN
  Administered 2017-04-23 (×3): 50 ug via INTRAVENOUS

## 2017-04-23 MED ORDER — VANCOMYCIN HCL IN DEXTROSE 1-5 GM/200ML-% IV SOLN
1000.0000 mg | INTRAVENOUS | Status: AC
  Administered 2017-04-23: 1000 mg via INTRAVENOUS
  Filled 2017-04-23: qty 200

## 2017-04-23 MED ORDER — ONDANSETRON HCL 4 MG/2ML IJ SOLN: 4.0000 mg | Freq: Four times a day (QID) | INTRAMUSCULAR | Status: DC | PRN

## 2017-04-23 MED ORDER — BUPIVACAINE LIPOSOME 1.3 % IJ SUSP
20.0000 mL | INTRAMUSCULAR | Status: DC
  Filled 2017-04-23: qty 20

## 2017-04-23 MED ORDER — SUCCINYLCHOLINE CHLORIDE 200 MG/10ML IV SOSY
PREFILLED_SYRINGE | INTRAVENOUS | Status: AC
  Filled 2017-04-23: qty 10

## 2017-04-23 MED ORDER — SUCCINYLCHOLINE CHLORIDE 200 MG/10ML IV SOSY
PREFILLED_SYRINGE | INTRAVENOUS | Status: DC | PRN
  Administered 2017-04-23: 140 mg via INTRAVENOUS

## 2017-04-23 MED ORDER — MORPHINE SULFATE (PF) 2 MG/ML IV SOLN
2.0000 mg | INTRAVENOUS | Status: DC | PRN
Start: 2017-04-23 — End: 2017-04-26
  Administered 2017-04-23 – 2017-04-26 (×11): 2 mg via INTRAVENOUS
  Filled 2017-04-23 (×11): qty 1

## 2017-04-23 MED ORDER — HYDROCODONE-ACETAMINOPHEN 5-325 MG PO TABS
1.0000 | ORAL_TABLET | ORAL | Status: DC | PRN
  Administered 2017-04-23 – 2017-04-26 (×11): 2 via ORAL
  Administered 2017-04-27 (×3): 1 via ORAL
  Filled 2017-04-23: qty 1
  Filled 2017-04-23 (×3): qty 2
  Filled 2017-04-23: qty 1
  Filled 2017-04-23 (×7): qty 2
  Filled 2017-04-23: qty 1

## 2017-04-23 MED ORDER — TRAMADOL HCL 50 MG PO TABS
50.0000 mg | ORAL_TABLET | Freq: Four times a day (QID) | ORAL | Status: DC | PRN
  Administered 2017-04-24 – 2017-04-27 (×2): 50 mg via ORAL
  Filled 2017-04-23 (×2): qty 1

## 2017-04-23 MED ORDER — 0.9 % SODIUM CHLORIDE (POUR BTL) OPTIME
TOPICAL | Status: DC | PRN
  Administered 2017-04-23 (×2): 1000 mL

## 2017-04-23 MED ORDER — LIDOCAINE 2% (20 MG/ML) 5 ML SYRINGE
INTRAMUSCULAR | Status: AC
  Filled 2017-04-23: qty 5

## 2017-04-23 MED ORDER — ONDANSETRON HCL 4 MG PO TABS: 4.0000 mg | ORAL_TABLET | Freq: Four times a day (QID) | ORAL | Status: DC | PRN

## 2017-04-23 MED ORDER — BUPIVACAINE HCL (PF) 0.5 % IJ SOLN
INTRAMUSCULAR | Status: DC | PRN
  Administered 2017-04-23: 5 mL

## 2017-04-23 MED ORDER — BUPIVACAINE HCL (PF) 0.5 % IJ SOLN
INTRAMUSCULAR | Status: AC
  Filled 2017-04-23: qty 30

## 2017-04-23 MED ORDER — SURGIFOAM 100 EX MISC
CUTANEOUS | Status: DC | PRN
  Administered 2017-04-23: 14:00:00 via TOPICAL

## 2017-04-23 MED ORDER — FENTANYL CITRATE (PF) 250 MCG/5ML IJ SOLN
INTRAMUSCULAR | Status: AC
  Filled 2017-04-23: qty 5

## 2017-04-23 MED ORDER — ALUM & MAG HYDROXIDE-SIMETH 200-200-20 MG/5ML PO SUSP: 30.0000 mL | Freq: Four times a day (QID) | ORAL | Status: DC | PRN

## 2017-04-23 MED ORDER — PROSIGHT PO TABS
1.0000 | ORAL_TABLET | Freq: Two times a day (BID) | ORAL | Status: DC
  Administered 2017-04-23 – 2017-04-27 (×8): 1 via ORAL
  Filled 2017-04-23 (×8): qty 1

## 2017-04-23 MED ORDER — DEXAMETHASONE SODIUM PHOSPHATE 10 MG/ML IJ SOLN
INTRAMUSCULAR | Status: AC
  Filled 2017-04-23: qty 1

## 2017-04-23 MED ORDER — SODIUM CHLORIDE 0.9% FLUSH
3.0000 mL | Freq: Two times a day (BID) | INTRAVENOUS | Status: DC
  Administered 2017-04-24 – 2017-04-25 (×4): 3 mL via INTRAVENOUS

## 2017-04-23 MED ORDER — LIDOCAINE-EPINEPHRINE 1 %-1:100000 IJ SOLN
INTRAMUSCULAR | Status: AC
  Filled 2017-04-23: qty 1

## 2017-04-23 MED ORDER — POLYVINYL ALCOHOL 1.4 % OP SOLN
1.0000 [drp] | Freq: Four times a day (QID) | OPHTHALMIC | Status: DC | PRN
  Filled 2017-04-23: qty 15

## 2017-04-23 MED ORDER — MEPERIDINE HCL 25 MG/ML IJ SOLN: 6.2500 mg | INTRAMUSCULAR | Status: DC | PRN

## 2017-04-23 MED ORDER — ACETAMINOPHEN 325 MG PO TABS
650.0000 mg | ORAL_TABLET | ORAL | Status: DC | PRN
Start: 2017-04-23 — End: 2017-04-27
  Administered 2017-04-24 – 2017-04-27 (×2): 650 mg via ORAL
  Filled 2017-04-23 (×2): qty 2

## 2017-04-23 MED ORDER — PHENYLEPHRINE 40 MCG/ML (10ML) SYRINGE FOR IV PUSH (FOR BLOOD PRESSURE SUPPORT)
PREFILLED_SYRINGE | INTRAVENOUS | Status: DC | PRN
  Administered 2017-04-23 (×4): 80 ug via INTRAVENOUS

## 2017-04-23 SURGICAL SUPPLY — 76 items
ADH SKN CLS APL DERMABOND .7 (GAUZE/BANDAGES/DRESSINGS) ×2
BASKET BONE COLLECTION (BASKET) ×5 IMPLANT
BLADE CLIPPER SURG (BLADE) IMPLANT
BONE CANC CHIPS 40CC CAN1/2 (Bone Implant) ×3 IMPLANT
BUR MATCHSTICK NEURO 3.0 LAGG (BURR) ×3 IMPLANT
BUR PRECISION FLUTE 5.0 (BURR) ×3 IMPLANT
CANISTER SUCT 3000ML PPV (MISCELLANEOUS) ×3 IMPLANT
CAP RELINE MOD TULIP RMM (Cap) ×4 IMPLANT
CARTRIDGE OIL MAESTRO DRILL (MISCELLANEOUS) ×1 IMPLANT
CHIPS CANC BONE 40CC CAN1/2 (Bone Implant) ×1 IMPLANT
CLIP NEUROVISION LG (CLIP) ×2 IMPLANT
CONT SPEC 4OZ CLIKSEAL STRL BL (MISCELLANEOUS) ×3 IMPLANT
COVER BACK TABLE 60X90IN (DRAPES) ×3 IMPLANT
DECANTER SPIKE VIAL GLASS SM (MISCELLANEOUS) ×3 IMPLANT
DERMABOND ADVANCED (GAUZE/BANDAGES/DRESSINGS) ×4
DERMABOND ADVANCED .7 DNX12 (GAUZE/BANDAGES/DRESSINGS) ×2 IMPLANT
DIFFUSER DRILL AIR PNEUMATIC (MISCELLANEOUS) ×3 IMPLANT
DRAPE C-ARM 42X72 X-RAY (DRAPES) ×3 IMPLANT
DRAPE C-ARMOR (DRAPES) ×3 IMPLANT
DRAPE LAPAROTOMY 100X72X124 (DRAPES) ×3 IMPLANT
DRAPE POUCH INSTRU U-SHP 10X18 (DRAPES) ×3 IMPLANT
DRAPE SURG 17X23 STRL (DRAPES) ×3 IMPLANT
DRSG OPSITE POSTOP 4X8 (GAUZE/BANDAGES/DRESSINGS) ×2 IMPLANT
DURAPREP 26ML APPLICATOR (WOUND CARE) ×3 IMPLANT
ELECT REM PT RETURN 9FT ADLT (ELECTROSURGICAL) ×3
ELECTRODE REM PT RTRN 9FT ADLT (ELECTROSURGICAL) ×1 IMPLANT
EVACUATOR 1/8 PVC DRAIN (DRAIN) ×2 IMPLANT
GAUZE SPONGE 4X4 12PLY STRL (GAUZE/BANDAGES/DRESSINGS) ×3 IMPLANT
GAUZE SPONGE 4X4 16PLY XRAY LF (GAUZE/BANDAGES/DRESSINGS) ×2 IMPLANT
GLOVE BIO SURGEON STRL SZ8 (GLOVE) ×6 IMPLANT
GLOVE BIOGEL PI IND STRL 8 (GLOVE) ×2 IMPLANT
GLOVE BIOGEL PI IND STRL 8.5 (GLOVE) ×2 IMPLANT
GLOVE BIOGEL PI INDICATOR 8 (GLOVE) ×4
GLOVE BIOGEL PI INDICATOR 8.5 (GLOVE) ×4
GLOVE ECLIPSE 8.0 STRL XLNG CF (GLOVE) ×6 IMPLANT
GLOVE EXAM NITRILE LRG STRL (GLOVE) IMPLANT
GLOVE EXAM NITRILE XL STR (GLOVE) IMPLANT
GLOVE EXAM NITRILE XS STR PU (GLOVE) IMPLANT
GOWN STRL REUS W/ TWL LRG LVL3 (GOWN DISPOSABLE) IMPLANT
GOWN STRL REUS W/ TWL XL LVL3 (GOWN DISPOSABLE) ×3 IMPLANT
GOWN STRL REUS W/TWL 2XL LVL3 (GOWN DISPOSABLE) IMPLANT
GOWN STRL REUS W/TWL LRG LVL3 (GOWN DISPOSABLE)
GOWN STRL REUS W/TWL XL LVL3 (GOWN DISPOSABLE) ×9
GRAFT BNE CHIP CANC 1-8 40 (Bone Implant) IMPLANT
KIT BASIN OR (CUSTOM PROCEDURE TRAY) ×3 IMPLANT
KIT POSITION SURG JACKSON T1 (MISCELLANEOUS) ×3 IMPLANT
KIT ROOM TURNOVER OR (KITS) ×3 IMPLANT
MILL MEDIUM DISP (BLADE) ×2 IMPLANT
MODULE NVM5 NEXT GEN EMG (NEEDLE) ×2 IMPLANT
NDL HYPO 25X1 1.5 SAFETY (NEEDLE) ×1 IMPLANT
NDL SPNL 18GX3.5 QUINCKE PK (NEEDLE) IMPLANT
NEEDLE HYPO 25X1 1.5 SAFETY (NEEDLE) ×3 IMPLANT
NEEDLE SPNL 18GX3.5 QUINCKE PK (NEEDLE) IMPLANT
NS IRRIG 1000ML POUR BTL (IV SOLUTION) ×3 IMPLANT
OIL CARTRIDGE MAESTRO DRILL (MISCELLANEOUS) ×3
PACK LAMINECTOMY NEURO (CUSTOM PROCEDURE TRAY) ×3 IMPLANT
PAD ARMBOARD 7.5X6 YLW CONV (MISCELLANEOUS) ×9 IMPLANT
PATTIES SURGICAL .5 X.5 (GAUZE/BANDAGES/DRESSINGS) IMPLANT
PATTIES SURGICAL .5 X1 (DISPOSABLE) IMPLANT
PATTIES SURGICAL 1X1 (DISPOSABLE) IMPLANT
ROD RELINE O COCR 5.0X75MM (Rod) ×4 IMPLANT
SCREW LOCK RSS 4.5/5.0MM (Screw) ×12 IMPLANT
SCREW POLY RMM 5.5X40 4S (Screw) ×12 IMPLANT
SCREW SHANK RELINE MOD 5.5X30 (Screw) ×4 IMPLANT
SPONGE LAP 4X18 X RAY DECT (DISPOSABLE) IMPLANT
SPONGE SURGIFOAM ABS GEL 100 (HEMOSTASIS) ×3 IMPLANT
STAPLER SKIN PROX WIDE 3.9 (STAPLE) IMPLANT
SUT VIC AB 1 CT1 18XBRD ANBCTR (SUTURE) ×2 IMPLANT
SUT VIC AB 1 CT1 8-18 (SUTURE) ×6
SUT VIC AB 2-0 CT1 18 (SUTURE) ×6 IMPLANT
SUT VIC AB 3-0 SH 8-18 (SUTURE) ×6 IMPLANT
SYR 5ML LL (SYRINGE) IMPLANT
TOWEL GREEN STERILE (TOWEL DISPOSABLE) ×3 IMPLANT
TOWEL GREEN STERILE FF (TOWEL DISPOSABLE) ×3 IMPLANT
TRAY FOLEY W/METER SILVER 16FR (SET/KITS/TRAYS/PACK) ×3 IMPLANT
WATER STERILE IRR 1000ML POUR (IV SOLUTION) ×3 IMPLANT

## 2017-04-23 NOTE — Brief Op Note (Signed)
04/23/2017  5:14 PM  PATIENT:  Sharon Gilmore  83 y.o. female  PRE-OPERATIVE DIAGNOSIS:  Spinal stenosis, Lumbar region without neurogenic claudication; Radiculopathy, herniated disc Lumbar region, foot drop, scoliosis, spondylolisthesis L 2 - L 5 levels  POST-OPERATIVE DIAGNOSIS:  Spinal stenosis, Lumbar region without neurogenic claudication; Radiculopathy, herniated disc Lumbar region, foot drop, scoliosis, spondylolisthesis L 2 - L 5 levels  PROCEDURE:  Procedure(s) with comments: L2 to L5 Decompression/Fusion/Possible interbodies (N/A) - L2 to L5 Decompression/Fusion/Possible interbodies Decompression L 2 -L 5 levels with pedicle screw fixation L 2 - L 5 levels with posterolateral arthrodesis L 2 - L 5 levels  SURGEON:  Surgeon(s) and Role:    Maeola Harman, MD - Primary    * Donalee Citrin, MD - Assisting  PHYSICIAN ASSISTANT:   ASSISTANTS: [place], RN   ANESTHESIA:   general  EBL:  Total I/O In: 1800 [I.V.:1800] Out: 625 [Urine:325; Blood:300]  BLOOD ADMINISTERED:none  DRAINS: (Medium) Hemovact drain(s) in the epidural space with  Suction Open   LOCAL MEDICATIONS USED:  MARCAINE    and LIDOCAINE   SPECIMEN:  No Specimen  DISPOSITION OF SPECIMEN:  N/A  COUNTS:  YES  TOURNIQUET:  * No tourniquets in log *  DICTATION: DICTATION: Patient is a 83 year old woman with scoliosis, stenosis, spondylolisthesis,  disc herniation and severe back and bilateral lower extremity pain and left foot drop at L 23, L34, L45 levels of the lumbar spine. It was elected to take her to surgery for decompression and fusion L 23, L 34, L 45  levels with posterolateral arthrodesis.  Procedure:   Following uncomplicated induction of GETA, and placement of electrodes for neural monitoring, patient was turned into a prone position on the [place] tableand using AP  fluoroscopy the area of planned incision was marked, prepped with betadine scrub and Duraprep, then draped. Exposure was performed of  facet joint complex at L 23, L 34, and L 45  levels and the MAS retractor was placed.5.5 x 30 mm cortical Nuvasive screws were placed at L 2 bilaterally according to standard landmarks using neural monitoring.  A total laminectomy of L 2,  L 3 and L 4 was then performed with disarticulation of facets.  Decompression was greater than for standard PLIF procedure and thorough decompression of the thecal sac, bilateral L 2, L 3, L 4,L 5 nerve roots was performed along with foraminal and extraforaminal portions of these nerve roots.  There was a significant amount of ligamentous hypertrophy and this ligament was fused to the dura, requiring painstaking dissection under Loupe magnification.  I elected to not place interbody cages out of concern regarding the patient's age and bone quality. This bone was saved for grafting, combined with allograft chips (40 cc) after being run through bone mill and was placed in bone packing device. Remaining screws were placed at L 3, L 4 and L 5 (5.5 x 40) and 75 mm rods were placed. And the screws were locked and torqued.Final Xrays showed well positioned screw fixation. The posterolateral region was packed with remaining 60 cc of autograft and allograft on each side. A medium Hemovac drain was placed. The wounds were irrigated and then closed with 1, 2-0 and 3-0 Vicryl stitches. 20 cc long-acting Marcaine was injected into the musculature.  Sterile occlusive dressing was placed with Dermabond and an occlusive dressing. The patient was then extubated in the operating room and taken to recovery in stable and satisfactory condition having tolerated her operation well. Counts  were correct at the end of the case.  PLAN OF CARE: Admit to inpatient   PATIENT DISPOSITION:  PACU - hemodynamically stable.   Delay start of Pharmacological VTE agent (>24hrs) due to surgical blood loss or risk of bleeding: yes

## 2017-04-23 NOTE — Anesthesia Postprocedure Evaluation (Signed)
Anesthesia Post Note  Patient: [name] Mester  Procedure(s) Performed: Procedure(s) (LRB): L2 to L5 Decompression/Fusion/Possible interbodies (N/A)     Patient location during evaluation: PACU Anesthesia Type: General Level of consciousness: awake and alert Pain management: pain level controlled Vital Signs Assessment: post-procedure vital signs reviewed and stable Respiratory status: spontaneous breathing, nonlabored ventilation and respiratory function stable Cardiovascular status: blood pressure returned to baseline and stable Postop Assessment: no signs of nausea or vomiting Anesthetic complications: no    Last Vitals:  Vitals:   04/23/17 1912 04/23/17 1927  BP: (!) 141/65 (!) 160/73  Pulse: 60 60  Resp: (!) 9 14  Temp:    SpO2: 100% 98%    Last Pain:  Vitals:   04/23/17 1841  TempSrc:   PainSc: 7                  Brinda Focht,W. EDMOND

## 2017-04-23 NOTE — Progress Notes (Signed)
Awake, alert, conversant.  Sore in back.  MAEW well with good strength except for persistent left foot drop. Perhaps a bit more toe movement, but DF not significantly improved.

## 2017-04-23 NOTE — Progress Notes (Signed)
Pharmacy Antibiotic Note  Sharon Gilmore is a 83 y.o. female admitted on 04/23/2017 with numbness.  Pharmacy has been consulted for vancomycin dosing.  Patient s/p spinal surgery, drain still present post-op. Orders to continue vancomycin for prophylaxis. Patient currently afebrile, and no current labs. Renal function was normal on 8/29.  Plan: Vancomycin 1500 mg IV q24 hours.  Goal trough 10-15 mcg/mL. Check bmet 9/9 if vancomycin continued  Weight: 180 lb (81.6 kg)  Temp (24hrs), Avg:97.7 F (36.5 C), Min:97.3 F (36.3 C), Max:98.2 F (36.8 C)  No results for input(s): WBC, CREATININE, LATICACIDVEN, VANCOTROUGH, VANCOPEAK, VANCORANDOM, GENTTROUGH, GENTPEAK, GENTRANDOM, TOBRATROUGH, TOBRAPEAK, TOBRARND, AMIKACINPEAK, AMIKACINTROU, AMIKACIN in the last 168 hours.  Estimated Creatinine Clearance: 58 mL/min (by C-G formula based on SCr of 0.57 mg/dL).    Allergies  Allergen Reactions  . Celebrex [Celecoxib]     LEG ACHING   . Codeine Nausea And Vomiting  . Doxycycline Swelling    Facial swelling  . Meloxicam     TUMMY ACHE, INEFFECTIVE  . Nortriptyline Other (See Comments)    unknown  . Percocet [Oxycodone-Acetaminophen] Nausea And Vomiting  . Penicillins Rash    Has patient had a PCN reaction causing immediate rash, facial/tongue/throat swelling, SOB or lightheadedness with hypotension: No Has patient had a PCN reaction causing severe rash involving mucus membranes or skin necrosis: No Has patient had a PCN reaction that required hospitalization No Has patient had a PCN reaction occurring within the last 10 years: No If all of the above answers are "NO", then may proceed with Cephalosporin use.   Thank you for allowing pharmacy to be a part of this patient's care.  Sharon Gilmore, Frank Rhea 04/23/2017 8:00 PM

## 2017-04-23 NOTE — H&P (Signed)
Patient ID:   [id number] Patient: Sharon Gilmore  Date of Birth: Oct 10, 1933 Visit Type: Office Visit   Date: 04/21/2017 08:45 AM Provider: Danae Orleans. Venetia Maxon MD   This 83 year old female presents for numbness.   History of Present Illness: 1.  numbness  Patient presents with left leg pain and left foot drop. She reports feeling better and is able to lift her left toe. Patient reports cardiologist notes she is healthy for surgery and her blood pressure is good.         Medical/Surgical/Interim History Reviewed, no change.  Last detailed document date:03/08/2017.     Family History: Reviewed, no changes.  Last detailed document date:03/08/2017.   Social History: Reviewed, no changes.   MEDICATIONS(added, continued or stopped this visit): Started Medication Directions Instruction Stopped   aspirin 81 mg tablet,delayed release take 1 tablet by oral route  every day  04/21/2017   enalapril maleate 20 mg tablet take 1 tablet by oral route  every day     gabapentin 300 mg capsule take 1 capsule by oral route at bedtime     hydrochlorothiazide 12.5 mg tablet take 1 tablet by oral route  every day  04/21/2017   hydrochlorothiazide 25 mg tablet take 1 tablet by oral route  every day     lidocaine 4 % topical cream   04/21/2017   Lipitor 20 mg tablet take 1 tablet by oral route  every day     metoprolol succinate ER 50 mg tablet,extended release 24 hr take 1 tablet by oral route  every day     oxcarbazepine 150 mg tablet take 1 tablet by oral route 2-3  times every day  04/21/2017   PreserVision AREDS 2 250 mg-200 unit-40 mg-1 mg capsule      Vitamin D3 2,000 unit capsule        ALLERGIES: Ingredient Reaction Medication Name Comment  CODEINE   "Nausea & Vomiting"  ACETAMINOPHEN  Percocet "Nausea & Vomiting"  PENICILLINS   "Rash"  OXYCODONE HCL  Percocet "Nausea & Vomiting"  DULOXETINE HCL  Cymbalta "Nausea & Groggy"  CELECOXIB  Celebrex "Legs ache"   Reviewed, no  changes.    Vitals Date Temp F BP Pulse Ht In Wt Lb BMI BSA Pain Score  04/21/2017  121/63 60 64 180.8 31.03  0/10      IMPRESSION Patient presents with left leg pain and left foot drop. On confrontational exam, 1/5 dorsiflexor with some toe movement, and 4/5 left hip abductor. Patient is scheduled for decompression and fusion of L2-5 on 04/23/17.  Completed Orders (this encounter) Order Details Reason Side Interpretation Result Initial Treatment Date Region  Hypertension education Patient to follow up with primary care provider.        Dietary management education, guidance, and counseling patient encouraged to eat a well balanced diet         Assessment/Plan # Detail Type Description   1. Assessment Essential (primary) hypertension (I10).       2. Assessment Body mass index (BMI) 31.0-31.9, adult (Z68.31).   Plan Orders Today's instructions / counseling include(s) Dietary management education, guidance, and counseling.           Pain Management Plan Pain Scale: 0/10. Method: Numeric Pain Intensity Scale.  Patient is scheduled for decompression and fusion without interbody grafts of L2-5 on 04/23/17. Nurse education given. Patient fitted for brace.  Orders: Instruction(s)/Education: Assessment Instruction  I10 Hypertension education  Z68.31 Dietary management education, guidance, and counseling  Provider:  Venetia Maxon MD, Danae Orleans 04/21/2017 9:03 AM  Dictation edited by: Lajoyce Lauber    CC Providers: Jenita Seashore [facility] [address] [place],  [place]  [address]   Levert Feinstein  [address] [place], [place] [address]-              Electronically signed by Danae Orleans Venetia Maxon MD on 04/21/2017 09:27 AM   Patient ID:   [id number] Patient: Sharon Gilmore  Date of Birth: 1933-12-08 Visit Type: Office Visit   Date: 03/08/2017 01:00 PM Provider: Danae Orleans. Venetia Maxon MD   This 83 year old female  presents for pain and numbness.   History of Present Illness: 1.  pain  2.  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 Ollen Bowl.  Patient initially seen at [facility] for neurologic for left upper quadrant pain and left lumbar pain March of this year.  Abdominal pain has resolved.  Left lumbar and left leg pain persist.  She does note some persistent mid day nausea. Patient reports injection decreased pain. However, she also reports numbness along the left leg through the big toe along with occasional "electrical shocks" down the leg following injections. She also notes increased shortness of breath and decreased HR.  02/04/2017 left L5-S1 transforaminal ESI 02/25/2017 left L4-5 transforaminal ESI  History:  HTN, seizure(80's), psoriasis, asthma, carpal tunnel syndrome, vitamin-D deficiency, murmur Surgical history:  Cholecystectomy 2004, incisional hernia repair, tonsillectomy  Gabapentin 300 milligrams q.h.s. Oxycarbazapine 20 milligrams q.h.s.   Thoracic and lumbar MRIs on [facility].  Lumbar plain films were performed at [facility].         Medical/Surgical/Interim History Reviewed, no change.     Family History: Reviewed, no changes.    Social History: Reviewed, no changes.   MEDICATIONS(added, continued or stopped this visit): Started Medication Directions Instruction Stopped   aspirin 81 mg tablet,delayed release take 1 tablet by oral route  every day     enalapril maleate 20 mg tablet take 1 tablet by oral route  every day     gabapentin 300 mg capsule take 1 capsule by oral route at bedtime     hydrochlorothiazide 12.5 mg tablet take 1 tablet by oral route  every day     oxcarbazepine 150 mg tablet take 1 tablet by oral route 2-3  times every day     PreserVision AREDS 2 250 mg-200 unit-40 mg-1 mg capsule      Vitamin D3 2,000 unit capsule        ALLERGIES: Ingredient Reaction Medication Name Comment  CODEINE   "Nausea & Vomiting"   ACETAMINOPHEN  Percocet "Nausea & Vomiting"  PENICILLINS   "Rash"  OXYCODONE HCL  Percocet "Nausea & Vomiting"  DULOXETINE HCL  Cymbalta "Nausea & Groggy"  CELECOXIB  Celebrex "Legs ache"      Vitals Date Temp F BP Pulse Ht In Wt Lb BMI BSA Pain Score  03/08/2017  178/67 48 64 187.4 32.17  0/10      IMPRESSION Patient presents with left leg pain and weakness with foot drop. Pain was slightly alleviated with injections. On confrontational testing, edema in the left leg, unable to pick toes or foot up, 20 degree SLR on the left pain to the foot and ankle, weakness in left lower extremity. Patient notes previous bone density test was normal. X-ray shows listhesis of L4 on L5 9 mm on neutral, 8 mm on flexion, and 6.8  mm on extension. Sever spinal stenosis at L2-3, and L3-4. Advised patient to be seen by her PCP for leg edema and shortness of breath. Schedule patient for decompression and fusion of L2-5.  Completed Orders (this encounter) Order Details Reason Side Interpretation Result Initial Treatment Date Region  Lumbar Spine- AP/Lat/Flex/Ex      03/08/2017   Scoliosis- AP/Lat      03/08/2017    Assessment/Plan # Detail Type Description   1. Assessment Spinal stenosis, lumbar region without neurogenic claud (M48.061).           Pain Management Plan Pain Scale: 0/10. Method: Numeric Pain Intensity Scale. Location: lower back and right leg.  Advised patient to be seen by her PCP for left leg edema and shortness of breath. Schedule patient for decompression and fusion of L2-5. Nurse education given.  Orders: Diagnostic Procedures: Assessment Procedure  M48.061 Lumbar Spine- AP/Lat/Flex/Ex  Z61.096 Scoliosis- AP/Lat             Provider:  Venetia Maxon MD, Danae Orleans 03/08/2017 2:32 PM  Dictation edited by: Lajoyce Lauber    CC Providers: Jenita Seashore [facility] [address] [place],  [place]  [phone]   Levert Feinstein  [address] [place], [place] [address]-              Electronically signed by Danae Orleans Venetia Maxon MD on 03/14/2017 03:33 PM   Patient ID:   [id number] Patient: Sharon Gilmore  Date of Birth: 12-22-1933 Visit Type: Office Visit   Date: 02/03/2017 01:15 PM Provider: Gwynne Edinger PhD MD   This 83 year old female presents for pain.   History of Present Illness: 1.  pain  Extraordinarily present, 63 issues, 83 year old right-handed woman, referred to me by Dr. Terrace Arabia for evaluation and treatment of left lower extremity radicular pain.  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  She went saw Dr. you can, who treated her with some gabapentin ordered MRI.  Follow-up from her MRI demonstrated some multilevel degenerative change,and stenosis.  Patient was continuing to be significantly debilitated, and was referred for further evaluation and treatment.  On presentation today patient describes sharp, achy pain in her left low back, with shooting, electric or knife-like sensations into the median anterior thigh, but also more electrical, numb or dysesthetic sensations along the buttock, posterior thigh posterior calf and into the lateral 3 toes.  She has not noticed any overt weakness per se, but after her exam (see below), she notices that over the last week or so she does seem to be tripping over her toes more often, or having a near "footdrop".  She denies give-way or other weakness.  She has had no changes in her bowel or bladder function.          PAST MEDICAL HISTORY, SURGICAL HISTORY, FAMILY HISTORY, SOCIAL HISTORY AND REVIEW OF SYSTEMS I have reviewed the patient's past medical, surgical, family and social history as well as the comprehensive review of systems as included on the [facility] history form dated 02/03/2017, which I have signed.   MEDICATIONS(added,  continued or stopped this visit): Started Medication Directions Instruction Stopped   aspirin 81 mg tablet,delayed release take 1 tablet by oral route  every day     enalapril maleate 20 mg tablet take 1 tablet by oral  route  every day     gabapentin 300 mg capsule take 1 capsule by oral route at bedtime     hydrochlorothiazide 12.5 mg tablet take 1 tablet by oral route  every day     oxcarbazepine 150 mg tablet take 1 tablet by oral route 2-3  times every day     PreserVision AREDS 2 250 mg-200 unit-40 mg-1 mg capsule      Vitamin D3 2,000 unit capsule        ALLERGIES: Ingredient Reaction Medication Name Comment  CODEINE   "Nausea & Vomiting"  ACETAMINOPHEN  Percocet "Nausea & Vomiting"  OXYCODONE HCL  Percocet "Nausea & Vomiting"  DULOXETINE HCL  Cymbalta "Nausea & Groggy"  CELECOXIB  Celebrex "Legs ache"  PENICILLINS   "Rash"   Reviewed, updated.   Review of Systems System Neg/Pos Details  Constitutional Positive Fatigue.  Constitutional Negative Chills, Fever, Malaise, Night sweats, Weight gain and Weight loss.  ENMT Negative Ear drainage, Hearing loss, Nasal drainage, Otalgia, Sinus pressure and Sore throat.  Eyes Negative Eye discharge, Eye pain and Vision changes.  Respiratory Negative Chronic cough, Cough, Dyspnea, Known TB exposure and Wheezing.  Cardio Negative Chest pain, Claudication, Edema and Irregular heartbeat/palpitations.  GI Negative Abdominal pain, Blood in stool, Change in stool pattern, Constipation, Decreased appetite, Diarrhea, Heartburn, Nausea and Vomiting.  GU Negative Dysuria, Hematuria, Polyuria (Genitourinary), Urinary frequency, Urinary incontinence and Urinary retention.  Endocrine Negative Cold intolerance, Heat intolerance, Polydipsia and Polyphagia.  Neuro Positive Gait disturbance.  Neuro Negative Dizziness, Extremity weakness, Headache, Memory impairment, Numbness in extremity, Seizures and Tremors.  Psych Negative Anxiety, Depression and  Insomnia.  Integumentary Negative Brittle hair, Brittle nails, Change in shape/size of mole(s), Hair loss, Hirsutism, Hives, Pruritus, Rash and Skin lesion.  MS Positive Joint pain.  MS Negative Back pain, Joint swelling, Muscle weakness and Neck pain.  Hema/Lymph Negative Easy bleeding, Easy bruising and Lymphadenopathy.  Allergic/Immuno Negative Contact allergy, Environmental allergies, Food allergies and Seasonal allergies.  Reproductive Negative Breast discharge and Breast lumps.   Vitals Date Temp F BP Pulse Ht In Wt Lb BMI BSA Pain Score  02/03/2017  132/82 88 64 182.2 31.27  8/10     PHYSICAL EXAM General Level of Distress: no acute distress Overall Appearance: Normal    Cardiovascular Cardiac: normal  Respiratory Lungs: non-labored  Neurological Orientation: normal Recent and Remote Memory: normal Attention Span and Concentration:   normal Language: Fluent, appropriate Fund of Knowledge: normal  Right Left Sensation: normal normal Upper Extremity Coordination: normal normal  Lower Extremity Coordination: normal normal  Musculoskeletal Gait and Station: antalgic gait, limps L   Motor Strength Lower extremity motor strength was tested in the clinically pertinent muscles. Any abnormal findings will be noted below.   Right Left Deltoid: 5/5 5/5 Biceps: 5/5 5/5 Triceps: 5/5 5/5 Infraspinatus: 5/5 5/5 Wrist Extensor: 5/5 5/5 Grip: 5/5 5/5 Finger Extensor: 5/5 5/5 Hip Flexor: 5/5 4+/5 Knee Extensor: 5/5 5/5 Tib Anterior: 5/5 5/5 EHL: 5/5 3/5 Medial Gastroc: 5/5 5/5   Deep Tendon Reflexes  Right Left Patellar: normal normal Achilles: normal decrease (near absent)  Sensory Sensation was tested at C2 to T1 and L1 to S1.   Cranial Nerves II. Optic Nerve/Visual Fields: bilateral normal III. Oculomotor: bilateral EOMI IV. Trochlear: bilateral EOMI V. Trigeminal: bilateral Facial sensation intact, symetric temporalis/masseter strength VI.  Abducens: bilateral EOMI VII. Facial: bilateral normal VIII. Acoustic/Vestibular: bilateral Intact to finger rub IX. Glossopharyngeal: bilateral Palate elevates symetrically X. Vagus: bilateral Uvula midline  XI. Spinal Accessory: bilateral Normal shoulder shrug XII. Hypoglossal: bilateral Tongue midline  Motor and other Tests Pronator drift: absent     Right Left Hoffman's: absent absent     DIAGNOSTIC RESULTS MRI of the lumbar spine, without contrast, obtained April to see her.  Independent review: Five lumbar vertebra, without bony lesion, or significant abnormality. Degenerative disc change at L2-3, L3-4, L4-5, L5-S1.  Trefoil severe stenosis at L2-3, left recess slightly worse than right; mild to moderate stenosis at L3-4, moderate at L4-5.     IMPRESSION Patient presents with a clinical picture indicative of an L5 radiculopathy, some EHL weakness, and potential S1 involvement with a 1 to 2+ Achilles reflex on the right, 0 on the left.  She has findings on her MRI concerning for stenosis at L2-3, which likely gives her her anterior thigh radicular symptoms.  Assessment/Plan # Detail Type Description   1. Assessment Spinal stenosis of lumbar region, unspecified whether neurogenic claudication present (M48.061).       2. Assessment Lumbosacral radiculopathy at L5 (M54.17).   Plan Orders - L5-S1 - Lumbar Spine - Transforaminal - left.           Reviewed the patient's MRI with her today, my clinical findings, made the following recommendations: We will try and direct therapy to the L5 nerve root, where she demonstrates neurologic changes.  Purpose of set injection would be to try and reverse the neurologic findings, as well as some pain.  If in fact we can accomplish this, would be surprised if he have a more isolated L2-3 level radiculopathy as well.  Given the nature of her stenosis that level, I would be more inclined to treat that with the nonspecific interlaminar L2-3  epidural steroid injection.  If she we see no reversal of her neurologic exam findings, consider neurosurgical consultation.  I have  reviewed my findings today, and the MRI with Dr. Venetia MaxonStern, who was available.  Orders: Office Procedures/Services: Assessment Service Comments  M54.17  - L5-S1 - Lumbar Spine - Transforaminal - left               Provider:  Ollen BowlHarkins PhD MD, Vic RipperPaul C 02/03/2017 2:37 PM  Dictation edited by: Gwynne EdingerPaul C. Harkins    CC Providers: Jenita SeashoreSamuel  [facility] [address] [place],  [place]  [phone]   Levert FeinsteinYijun Yan  [address] [place], [place] [address]-              Electronically signed by Gwynne EdingerPaul C. Harkins PhD MD on 02/03/2017 02:37 PM

## 2017-04-23 NOTE — Interval H&P Note (Signed)
History and Physical Interval Note:  04/23/2017 1:09 PM  Sharon Gilmore  has presented today for surgery, with the diagnosis of Spinal stenosis, Lumbar region without neurogenic claudication; Radiculopathy, Lumbosacral region  The various methods of treatment have been discussed with the patient and family. After consideration of risks, benefits and other options for treatment, the patient has consented to  Procedure(s) with comments: L2 to L5 Decompression/Fusion/Possible interbodies (N/A) - L2 to L5 Decompression/Fusion/Possible interbodies as a surgical intervention .  The patient's history has been reviewed, patient examined, no change in status, stable for surgery.  I have reviewed the patient's chart and labs.  Questions were answered to the patient's satisfaction.     Laticia Vannostrand D

## 2017-04-23 NOTE — Transfer of Care (Signed)
Immediate Anesthesia Transfer of Care Note  Patient: Sharon Gilmore  Procedure(s) Performed: Procedure(s) with comments: L2 to L5 Decompression/Fusion/Possible interbodies (N/A) - L2 to L5 Decompression/Fusion/Possible interbodies  Patient Location: PACU  Anesthesia Type:General  Level of Consciousness: awake, alert  and patient cooperative  Airway & Oxygen Therapy: Patient Spontanous Breathing  Post-op Assessment: Report given to RN and Post -op Vital signs reviewed and stable  Post vital signs: Reviewed and stable  Last Vitals:  Vitals:   04/23/17 1021  BP: (!) 140/58  Pulse: 60  Resp: 18  Temp: 36.8 C  SpO2: 98%    Last Pain:  Vitals:   04/23/17 1021  TempSrc: Oral         Complications: No apparent anesthesia complications

## 2017-04-23 NOTE — Anesthesia Preprocedure Evaluation (Addendum)
Anesthesia Evaluation  Patient identified by MRN, date of birth, ID band Patient awake    Reviewed: Allergy & Precautions, NPO status , Patient's Chart, lab work & pertinent test results  Airway Mallampati: II  TM Distance: >3 FB Neck ROM: Full    Dental  (+) Teeth Intact, Dental Advisory Given, Caps   Pulmonary asthma , former smoker,    breath sounds clear to auscultation       Cardiovascular hypertension, Pt. on medications and Pt. on home beta blockers negative cardio ROS  + dysrhythmias + pacemaker  Rhythm:Regular Rate:Normal     Neuro/Psych Seizures -,  PSYCHIATRIC DISORDERS Depression  Neuromuscular disease negative neurological ROS     GI/Hepatic negative GI ROS, Neg liver ROS,   Endo/Other  negative endocrine ROS  Renal/GU negative Renal ROS     Musculoskeletal  (+) Arthritis ,   Abdominal (+) + obese,   Peds  Hematology negative hematology ROS (+)   Anesthesia Other Findings Day of surgery medications reviewed with the patient.  Reproductive/Obstetrics                            Lab Results  Component Value Date   WBC 8.5 04/14/2017   HGB 11.8 (L) 04/14/2017   HCT 36.9 04/14/2017   MCV 88.5 04/14/2017   PLT 244 04/14/2017   Lab Results  Component Value Date   CREATININE 0.57 04/14/2017   BUN 11 04/14/2017   NA 135 04/14/2017   K 3.6 04/14/2017   CL 101 04/14/2017   CO2 26 04/14/2017   Lab Results  Component Value Date   INR 1.27 03/09/2017   EKG: Atrial sensed, Ventricle paced.  Echo: - Left ventricle: The cavity size was normal. Wall thickness was   increased in a pattern of mild LVH. Systolic function was normal.   The estimated ejection fraction was in the range of 55% to 60%.   Doppler parameters are consistent with both elevated ventricular   end-diastolic filling pressure and elevated left atrial filling   pressure. - Aortic valve: Valve area (Vmax):  2.59 cm^2. - Mitral valve: Valve area by continuity equation (using LVOT   flow): 2.07 cm^2. - Left atrium: The atrium was mildly dilated. - Atrial septum: No defect or patent foramen ovale was identified. - Pericardium, extracardiac: A trivial pericardial effusion was   identified posterior to the heart.   Anesthesia Physical Anesthesia Plan  ASA: III  Anesthesia Plan: General   Post-op Pain Management:    Induction: Intravenous  PONV Risk Score and Plan: 4 or greater and Ondansetron, Dexamethasone, Midazolam and Treatment may vary due to age or medical condition  Airway Management Planned: Oral ETT  Additional Equipment:   Intra-op Plan:   Post-operative Plan: Extubation in OR  Informed Consent: I have reviewed the patients History and Physical, chart, labs and discussed the procedure including the risks, benefits and alternatives for the proposed anesthesia with the patient or authorized representative who has indicated his/her understanding and acceptance.   Dental advisory given  Plan Discussed with: CRNA  Anesthesia Plan Comments: (Per Cardiology recs, pacemaker will be placed in async mode. )       Anesthesia Quick Evaluation

## 2017-04-23 NOTE — Anesthesia Procedure Notes (Signed)
Procedure Name: Intubation Date/Time: 04/23/2017 1:11 PM Performed by: Julian ReilWELTY, Yoshimi Sarr F Pre-anesthesia Checklist: Patient identified, Emergency Drugs available, Suction available, Patient being monitored and Timeout performed Patient Re-evaluated:Patient Re-evaluated prior to induction Oxygen Delivery Method: Circle system utilized Preoxygenation: Pre-oxygenation with 100% oxygen Induction Type: IV induction Ventilation: Mask ventilation without difficulty Laryngoscope Size: Glidescope and 4 Grade View: Grade I Tube type: Oral Tube size: 7.5 mm Number of attempts: 1 Airway Equipment and Method: Video-laryngoscopy and Stylet Placement Confirmation: ETT inserted through vocal cords under direct vision,  positive ETCO2 and breath sounds checked- equal and bilateral Secured at: 23 cm Tube secured with: Tape Dental Injury: Teeth and Oropharynx as per pre-operative assessment  Comments: Glidescope used due to "overbite" of teeth and full upper front teeth implants, none loose per patient report.

## 2017-04-23 NOTE — Op Note (Signed)
04/23/2017  5:14 PM  PATIENT:  Sharon Gilmore  82 y.o. female  PRE-OPERATIVE DIAGNOSIS:  Spinal stenosis, Lumbar region without neurogenic claudication; Radiculopathy, herniated disc Lumbar region, foot drop, scoliosis, spondylolisthesis L 2 - L 5 levels  POST-OPERATIVE DIAGNOSIS:  Spinal stenosis, Lumbar region without neurogenic claudication; Radiculopathy, herniated disc Lumbar region, foot drop, scoliosis, spondylolisthesis L 2 - L 5 levels  PROCEDURE:  Procedure(s) with comments: L2 to L5 Decompression/Fusion/Possible interbodies (N/A) - L2 to L5 Decompression/Fusion/Possible interbodies Decompression L 2 -L 5 levels with pedicle screw fixation L 2 - L 5 levels with posterolateral arthrodesis L 2 - L 5 levels  SURGEON:  Surgeon(s) and Role:    * Jairen Goldfarb, MD - Primary    * Cram, Gary, MD - Assisting  PHYSICIAN ASSISTANT:   ASSISTANTS: Poteat, RN   ANESTHESIA:   general  EBL:  Total I/O In: 1800 [I.V.:1800] Out: 625 [Urine:325; Blood:300]  BLOOD ADMINISTERED:none  DRAINS: (Medium) Hemovact drain(s) in the epidural space with  Suction Open   LOCAL MEDICATIONS USED:  MARCAINE    and LIDOCAINE   SPECIMEN:  No Specimen  DISPOSITION OF SPECIMEN:  N/A  COUNTS:  YES  TOURNIQUET:  * No tourniquets in log *  DICTATION: DICTATION: Patient is a 82-year-old woman with scoliosis, stenosis, spondylolisthesis,  disc herniation and severe back and bilateral lower extremity pain and left foot drop at L 23, L34, L45 levels of the lumbar spine. It was elected to take her to surgery for decompression and fusion L 23, L 34, L 45  levels with posterolateral arthrodesis.  Procedure:   Following uncomplicated induction of GETA, and placement of electrodes for neural monitoring, patient was turned into a prone position on the Jackson tableand using AP  fluoroscopy the area of planned incision was marked, prepped with betadine scrub and Duraprep, then draped. Exposure was performed of  facet joint complex at L 23, L 34, and L 45  levels and the MAS retractor was placed.5.5 x 30 mm cortical Nuvasive screws were placed at L 2 bilaterally according to standard landmarks using neural monitoring.  A total laminectomy of L 2,  L 3 and L 4 was then performed with disarticulation of facets.  Decompression was greater than for standard PLIF procedure and thorough decompression of the thecal sac, bilateral L 2, L 3, L 4,L 5 nerve roots was performed along with foraminal and extraforaminal portions of these nerve roots.  There was a significant amount of ligamentous hypertrophy and this ligament was fused to the dura, requiring painstaking dissection under Loupe magnification.  I elected to not place interbody cages out of concern regarding the patient's age and bone quality. This bone was saved for grafting, combined with allograft chips (40 cc) after being run through bone mill and was placed in bone packing device. Remaining screws were placed at L 3, L 4 and L 5 (5.5 x 40) and 75 mm rods were placed. And the screws were locked and torqued.Final Xrays showed well positioned screw fixation. The posterolateral region was packed with remaining 60 cc of autograft and allograft on each side. A medium Hemovac drain was placed. The wounds were irrigated and then closed with 1, 2-0 and 3-0 Vicryl stitches. 20 cc long-acting Marcaine was injected into the musculature.  Sterile occlusive dressing was placed with Dermabond and an occlusive dressing. The patient was then extubated in the operating room and taken to recovery in stable and satisfactory condition having tolerated her operation well. Counts   were correct at the end of the case.  PLAN OF CARE: Admit to inpatient   PATIENT DISPOSITION:  PACU - hemodynamically stable.   Delay start of Pharmacological VTE agent (>24hrs) due to surgical blood loss or risk of bleeding: yes

## 2017-04-24 NOTE — Progress Notes (Signed)
Subjective: Patient reports doing well  Objective: Vital signs in last 24 hours: Temp:  [97.3 F (36.3 C)-98.2 F (36.8 C)] 97.5 F (36.4 C) (09/08 0143) Pulse Rate:  [59-70] 67 (09/08 0143) Resp:  [7-19] 16 (09/08 0143) BP: (127-171)/(58-77) 136/65 (09/08 0143) SpO2:  [93 %-100 %] 95 % (09/08 0143) Weight:  [179 lb 14.3 oz (81.6 kg)-180 lb (81.6 kg)] 179 lb 14.3 oz (81.6 kg) (09/08 0302)  Intake/Output from previous day: 09/07 0730 - 09/08 0729 In: 2901.3 [I.V.:2401.3; IV Piggyback:500] Out: 1250 [Urine:800; Drains:150; Blood:300] Intake/Output this shift: Total I/O In: 2901.3 [I.V.:2401.3; IV Piggyback:500] Out: 1250 [Urine:800; Drains:150; Blood:300]  Physical Exam: Up in chair in brace.  Left foot drop stable.  Right leg strength full.   Lab Results: No results for input(s): WBC, HGB, HCT, PLT in the last 72 hours. BMET No results for input(s): NA, K, CL, CO2, GLUCOSE, BUN, CREATININE, CALCIUM in the last 72 hours.  Studies/Results: Dg Lumbar Spine 2-3 Views  Result Date: 04/23/2017 CLINICAL DATA:  Lumbar fusion EXAM: DG C-ARM 61-120 MIN; LUMBAR SPINE - 2-3 VIEW COMPARISON:  Preoperative lumbar spine radiographs 03/08/2017, lumbar MRI 01/14/2017 FLUOROSCOPY TIME:  1 minutes 25 seconds Images obtained: 12 FINDINGS: Preoperative imaging labeled with 5 lumbar vertebra. Initial image demonstrates metallic probe extending dorsal to the inferior L2 level. Subsequent images demonstrate localization of the L3-L5. Images demonstrate placement of BILATERAL pedicle screws at L2, L3, L4 and L5. Bones demineralized with multilevel disc space narrowing and endplate spur formation. No fracture or subluxation identified. Atherosclerotic calcification aorta. IMPRESSION: Placement of BILATERAL pedicle screws at L2-L5. Electronically Signed   By: Ulyses SouthwardMark  Boles M.D.   On: 04/23/2017 16:28   Dg C-arm 1-60 Min  Result Date: 04/23/2017 CLINICAL DATA:  Lumbar fusion EXAM: DG C-ARM 61-120 MIN; LUMBAR  SPINE - 2-3 VIEW COMPARISON:  Preoperative lumbar spine radiographs 03/08/2017, lumbar MRI 01/14/2017 FLUOROSCOPY TIME:  1 minutes 25 seconds Images obtained: 12 FINDINGS: Preoperative imaging labeled with 5 lumbar vertebra. Initial image demonstrates metallic probe extending dorsal to the inferior L2 level. Subsequent images demonstrate localization of the L3-L5. Images demonstrate placement of BILATERAL pedicle screws at L2, L3, L4 and L5. Bones demineralized with multilevel disc space narrowing and endplate spur formation. No fracture or subluxation identified. Atherosclerotic calcification aorta. IMPRESSION: Placement of BILATERAL pedicle screws at L2-L5. Electronically Signed   By: Ulyses SouthwardMark  Boles M.D.   On: 04/23/2017 16:28    Assessment/Plan: Patient progressing nicely.  Continue Hemovac today (150).  [facility] for Rehab Monday if does well over weekend.    LOS: 1 day    Ladavia Lindenbaum D, MD 04/24/2017, 7:01 AM

## 2017-04-24 NOTE — Evaluation (Signed)
Physical Therapy Evaluation Patient Details Name: Sharon Gilmore MRN: [id number] DOB: May 30, 1934 Today's Date: 04/24/2017   History of Present Illness  Pt is a 83 y/o female s/p decompression and fusion without interbody grafts of L2-5. Pt has a past medical history including Arthritis; Asthma; Carpal tunnel syndrome; Depression; Erythema nodosum; Hypertension; Swallowing disorder; and Systolic murmur; Pacemaker Implant (03/09/2017); LEFT HEART CATH AND CORONARY ANGIOGRAPHY (03/15/2017); and Bilateral carpal tunnel release.  Clinical Impression  Pt presented supine in bed with HOB flat, awake and willing to participate in therapy session. Pt's son present throughout session as well. Prior to admission, pt reported that she was independent with ADLs and used a rollator to ambulate. Pt ambulated ~50' with RW and min guard for safety. Pt very limited secondary to fatigue and pain. Pt able to recall 3/3 back precautions independently. Pt would continue to benefit from skilled physical therapy services at this time while admitted and after d/c to address the below listed limitations in order to improve overall safety and independence with functional mobility.     Follow Up Recommendations SNF    Equipment Recommendations  None recommended by PT    Recommendations for Other Services       Precautions / Restrictions Precautions Precautions: Back Precaution Booklet Issued: Yes (comment) Precaution Comments: Pt able to recall 3/3 back precautions Required Braces or Orthoses: Spinal Brace Spinal Brace: Lumbar corset;Applied in sitting position Restrictions Weight Bearing Restrictions: No Other Position/Activity Restrictions: pacemaker ~1 month ago      Mobility  Bed Mobility Overal bed mobility: Needs Assistance Bed Mobility: Rolling;Sidelying to Sit;Sit to Sidelying Rolling: Min guard Sidelying to sit: Min guard     Sit to sidelying: Min guard General bed mobility comments:  increased time, cueing for log roll, min guard for safety, use of bed rails  Transfers Overall transfer level: Needs assistance Equipment used: Rolling walker (2 wheeled) Transfers: Sit to/from Stand Sit to Stand: Min assist         General transfer comment: assist for stability with transition into standing from sitting EOB  Ambulation/Gait Ambulation/Gait assistance: Min guard Ambulation Distance (Feet): 50 Feet Assistive device: Rolling walker (2 wheeled) Gait Pattern/deviations: Step-to pattern;Step-through pattern;Decreased step length - right;Decreased step length - left;Decreased stride length;Decreased dorsiflexion - left Gait velocity: decreased Gait velocity interpretation: Below normal speed for age/gender General Gait Details: consistent foot drop on L with exaggerated hip flexion and knee flexion to compensate. pt limited secondary to fatigue and pain  Stairs            Wheelchair Mobility    Modified Rankin (Stroke Patients Only)       Balance Overall balance assessment: Needs assistance Sitting-balance support: No upper extremity supported;Feet supported Sitting balance-Leahy Scale: Good Sitting balance - Comments: sitting EOB to don brace   Standing balance support: Bilateral upper extremity supported;During functional activity Standing balance-Leahy Scale: Poor Standing balance comment: reliant on bilateral UEs on RW                             Pertinent Vitals/Pain Pain Assessment: Faces Pain Score: 5  Faces Pain Scale: Hurts little more Pain Location: back and R LE Pain Descriptors / Indicators: Sore Pain Intervention(s): Monitored during session;Repositioned    Home Living Family/patient expects to be discharged to:: Skilled nursing facility                 Additional Comments: Patient lives in ILF at  [facility] and is going to rehab there    Prior Function Level of Independence: Independent with assistive device(s)          Comments: pt was ambulating with use of rollator     Hand Dominance   Dominant Hand: Right    Extremity/Trunk Assessment   Upper Extremity Assessment Upper Extremity Assessment: Defer to OT evaluation    Lower Extremity Assessment Lower Extremity Assessment: Generalized weakness;LLE deficits/detail LLE Deficits / Details: pt with no active ankle DF, able to extend all toes against gravity; pt with consistent foot drop and exaggerated hip and knee flexion to compensate    Cervical / Trunk Assessment Cervical / Trunk Assessment: Other exceptions Cervical / Trunk Exceptions: s/p back sx  Communication   Communication: No difficulties  Cognition Arousal/Alertness: Awake/alert Behavior During Therapy: WFL for tasks assessed/performed Overall Cognitive Status: Within Functional Limits for tasks assessed                                        General Comments      Exercises     Assessment/Plan    PT Assessment Patient needs continued PT services  PT Problem List Decreased range of motion;Decreased strength;Decreased activity tolerance;Decreased balance;Decreased mobility;Decreased coordination;Decreased knowledge of use of DME;Decreased safety awareness;Decreased knowledge of precautions;Pain       PT Treatment Interventions DME instruction;Gait training;Stair training;Therapeutic exercise;Functional mobility training;Therapeutic activities;Balance training;Neuromuscular re-education;Patient/family education    PT Goals (Current goals can be found in the Care Plan section)  Acute Rehab PT Goals Patient Stated Goal: to walk without DME by Thanksgiving PT Goal Formulation: With patient/family Time For Goal Achievement: 05/08/17 Potential to Achieve Goals: Good    Frequency Min 5X/week   Barriers to discharge        Co-evaluation               AM-PAC PT "6 Clicks" Daily Activity  Outcome Measure Difficulty turning over in bed  (including adjusting bedclothes, sheets and blankets)?: A Lot Difficulty moving from lying on back to sitting on the side of the bed? : A Lot Difficulty sitting down on and standing up from a chair with arms (e.g., wheelchair, bedside commode, etc,.)?: Unable Help needed moving to and from a bed to chair (including a wheelchair)?: A Little Help needed walking in hospital room?: A Little Help needed climbing 3-5 steps with a railing? : A Lot 6 Click Score: 13    End of Session Equipment Utilized During Treatment: Gait belt;Back brace Activity Tolerance: Patient limited by fatigue;Patient limited by pain Patient left: in bed;with call bell/phone within reach;with family/visitor present Nurse Communication: Mobility status PT Visit Diagnosis: Pain;Other abnormalities of gait and mobility (R26.89) Pain - part of body:  (back)    Time: 1341-1405 PT Time Calculation (min) (ACUTE ONLY): 24 min   Charges:   PT Evaluation $PT Eval Moderate Complexity: 1 Mod PT Treatments $Gait Training: 8-22 mins   PT G Codes:        GreenwoodJennifer Punam Broussard, PT, Tennessee[place]PT [phone]   Alessandra BevelsJennifer M Lorette Peterkin 04/24/2017, 2:19 PM

## 2017-04-24 NOTE — Evaluation (Signed)
Occupational Therapy Evaluation Patient Details Name: Sharon Gilmore MRN: [id number] DOB: Jan 14, 1934 Today's Date: 04/24/2017    History of Present Illness Pt is a 83 y/o female s/p decompression and fusion without interbody grafts of L2-5. Pt has a past medical history including Arthritis; Asthma; Carpal tunnel syndrome; Depression; Erythema nodosum; Hypertension; Swallowing disorder; and Systolic murmur; Pacemaker Implant (03/09/2017); LEFT HEART CATH AND CORONARY ANGIOGRAPHY (03/15/2017); and Bilateral carpal tunnel release.   Clinical Impression   PTA Pt independent living including ADL at [facility], and using a rollator for ambulation. Pt is currently max A for LB ADL and min guard for other ADL. Back handout provided and reviewed adls in detail. Pt educated on: clothing between brace, never sleep in brace, set an alarm at night for medication, avoid sitting for long periods of time, correct bed positioning for sleeping, correct sequence for bed mobility, avoiding lifting more than 5 pounds and never wash directly over incision. Pt will continue to benefit from skilled OT in the acute setting and afterwards at SNF level to maximize safety and independence in ADL and functional transfers while maintaining back precautions. Next session to introduce AE.      Follow Up Recommendations  SNF;Supervision/Assistance - 24 hour    Equipment Recommendations  Other (comment) (defer to next venue)    Recommendations for Other Services       Precautions / Restrictions Precautions Precautions: Back Precaution Booklet Issued: Yes (comment) Precaution Comments: Back handout reviewed in full Required Braces or Orthoses: Spinal Brace Spinal Brace: Lumbar corset;Applied in sitting position Restrictions Other Position/Activity Restrictions: pacemaker ~1 month ago      Mobility Bed Mobility Overal bed mobility: Needs Assistance Bed Mobility: Rolling;Sidelying to Sit;Sit to Sidelying Rolling:  Min guard (vc to bend knee to assist) Sidelying to sit: Min guard;HOB elevated (use of bed rail to assist)     Sit to sidelying: Mod assist (assist for BLE into bed) General bed mobility comments: educated on technique, provided handout  Transfers Overall transfer level: Needs assistance Equipment used: Rolling walker (2 wheeled) Transfers: Sit to/from Stand Sit to Stand: Min assist         General transfer comment: vc for safe hand placement    Balance Overall balance assessment: Needs assistance Sitting-balance support: No upper extremity supported;Feet supported Sitting balance-Leahy Scale: Good Sitting balance - Comments: sitting EOB to don brace   Standing balance support: Bilateral upper extremity supported;No upper extremity supported;During functional activity Standing balance-Leahy Scale: Fair Standing balance comment: able to perform static standing without UE support                           ADL either performed or assessed with clinical judgement   ADL Overall ADL's : Needs assistance/impaired Eating/Feeding: Modified independent;Sitting   Grooming: Min guard;Standing;Wash/dry hands Grooming Details (indicate cue type and reason): educated on compensatory strategies for sink level grooming (cup method for oral care, putting things on the right to prevent twisting) Upper Body Bathing: Moderate assistance;Sitting   Lower Body Bathing: Maximal assistance;Sitting/lateral leans   Upper Body Dressing : Moderate assistance;Sitting Upper Body Dressing Details (indicate cue type and reason): to don brace sitting EOB - brace education provided Lower Body Dressing: Maximal assistance   Toilet Transfer: Cueing for safety;Min guard;Ambulation;RW;Regular Toilet;Grab bars Toilet Transfer Details (indicate cue type and reason): vc for safety with walker/placement of walker Toileting- Clothing Manipulation and Hygiene: Min guard;Sitting/lateral lean;Adhering to  back precautions Toileting - Clothing Manipulation  Details (indicate cue type and reason): discussed rear peri care and maintenance of precautions Tub/ Shower Transfer: Walk-in shower;Min guard   Functional mobility during ADLs: Min guard;Minimal assistance;Cueing for safety;Rolling walker (min vc for RW placement during mobility) General ADL Comments: Pt able to recall 2/3 back precautions prior to OT beginning education     Vision Baseline Vision/History: Wears glasses Wears Glasses: At all times Patient Visual Report: No change from baseline Vision Assessment?: No apparent visual deficits     Perception     Praxis      Pertinent Vitals/Pain Pain Assessment: 0-10 Pain Score: 5  Pain Location: incision site Pain Descriptors / Indicators: Tightness Pain Intervention(s): Monitored during session;Repositioned;Premedicated before session     Hand Dominance Right   Extremity/Trunk Assessment Upper Extremity Assessment Upper Extremity Assessment: Overall WFL for tasks assessed   Lower Extremity Assessment Lower Extremity Assessment: Defer to PT evaluation   Cervical / Trunk Assessment Cervical / Trunk Assessment: Other exceptions Cervical / Trunk Exceptions: s/p back sx   Communication Communication Communication: No difficulties   Cognition Arousal/Alertness: Awake/alert Behavior During Therapy: WFL for tasks assessed/performed Overall Cognitive Status: Within Functional Limits for tasks assessed                                     General Comments       Exercises     Shoulder Instructions      Home Living Family/patient expects to be discharged to:: Skilled nursing facility                                 Additional Comments: Patient lives in ILF at [place].      Prior Functioning/Environment Level of Independence: Independent;Independent with assistive device(s)        Comments: used rollator at home PTA, hopes to not  need it anymore        OT Problem List: Decreased activity tolerance;Impaired balance (sitting and/or standing);Decreased safety awareness;Decreased knowledge of use of DME or AE;Decreased knowledge of precautions;Pain      OT Treatment/Interventions:      OT Goals(Current goals can be found in the care plan section) Acute Rehab OT Goals Patient Stated Goal: to walk without DME by Thanksgiving OT Goal Formulation: With patient Time For Goal Achievement: 05/08/17 Potential to Achieve Goals: Good ADL Goals Pt Will Perform Grooming: with supervision;standing Pt Will Perform Upper Body Dressing: sitting;with set-up Pt Will Perform Lower Body Dressing: with set-up;with adaptive equipment;sit to/from stand Pt Will Transfer to Toilet: with modified independence;ambulating (with RW, 3 in1 over commode) Pt Will Perform Toileting - Clothing Manipulation and hygiene: with modified independence;with adaptive equipment;sitting/lateral leans Additional ADL Goal #1: Pt will recall and maintain 3/3 back precautions during a morning ADL routine Additional ADL Goal #2: Pt will perform bed mobility at supervision level prior to initiating ADL  OT Frequency:     Barriers to D/C:            Co-evaluation              AM-PAC PT "6 Clicks" Daily Activity     Outcome Measure Help from another person eating meals?: None Help from another person taking care of personal grooming?: A Little Help from another person toileting, which includes using toliet, bedpan, or urinal?: A Little Help from another person bathing (including washing, rinsing,  drying)?: A Lot Help from another person to put on and taking off regular upper body clothing?: A Lot Help from another person to put on and taking off regular lower body clothing?: A Lot 6 Click Score: 16   End of Session Equipment Utilized During Treatment: Rolling walker;Back brace Nurse Communication: Mobility status;Precautions  Activity Tolerance:  Patient tolerated treatment well Patient left: in bed;with call bell/phone within reach;with SCD's reapplied  OT Visit Diagnosis: Other abnormalities of gait and mobility (R26.89);Unsteadiness on feet (R26.81);Pain Pain - Right/Left: Left Pain - part of body: Leg (back)                Time: [id number] OT Time Calculation (min): 29 min Charges:  OT General Charges $OT Visit: 1 Visit OT Evaluation $OT Eval Moderate Complexity: 1 Mod OT Treatments $Self Care/Home Management : 8-22 mins G-Codes:     Sherryl MangesLaura Rayola Everhart OTR/L [phone] Evern BioLaura J Analycia Khokhar 04/24/2017, 11:36 AM

## 2017-04-25 LAB — BASIC METABOLIC PANEL
ANION GAP: 7 (ref 5–15)
BUN: 5 mg/dL — ABNORMAL LOW (ref 6–20)
CALCIUM: 8.4 mg/dL — AB (ref 8.9–10.3)
CHLORIDE: 103 mmol/L (ref 101–111)
CO2: 27 mmol/L (ref 22–32)
CREATININE: 0.51 mg/dL (ref 0.44–1.00)
GFR calc Af Amer: >60 mL/min (ref >60)
GFR calc non Af Amer: >60 mL/min (ref >60)
Glucose, Bld: 140 mg/dL — ABNORMAL HIGH (ref 65–99)
Potassium: 3.6 mmol/L (ref 3.5–5.1)
Sodium: 137 mmol/L (ref 135–145)

## 2017-04-25 MED ORDER — DEXAMETHASONE SODIUM PHOSPHATE 10 MG/ML IJ SOLN
10.0000 mg | Freq: Four times a day (QID) | INTRAMUSCULAR | Status: DC
  Administered 2017-04-25 – 2017-04-26 (×4): 10 mg via INTRAVENOUS
  Filled 2017-04-25 (×4): qty 1

## 2017-04-25 MED ORDER — PANTOPRAZOLE SODIUM 40 MG PO TBEC
40.0000 mg | DELAYED_RELEASE_TABLET | Freq: Two times a day (BID) | ORAL | Status: DC
  Administered 2017-04-25 – 2017-04-27 (×5): 40 mg via ORAL
  Filled 2017-04-25 (×5): qty 1

## 2017-04-25 NOTE — Progress Notes (Signed)
PT Cancellation Note  Patient Details Name: Sharon Gilmore MRN: [id number] DOB: 04-17-1934   Cancelled Treatment:    Reason Eval/Treat Not Completed: Pain limiting ability to participate;Fatigue/lethargy limiting ability to participate   Fabio AsaDevon J Marek Nghiem 04/25/2017, 3:58 PM

## 2017-04-25 NOTE — Clinical Social Work Note (Signed)
Clinical Social Work Assessment  Patient Details  Name: Sharon Gilmore MRN: [id number] Date of Birth: 10/23/1933  Date of referral:  04/25/17               Reason for consult:  Facility Placement                Permission sought to share information with:  Facility Sport and exercise psychologist, Family Supports Permission granted to share information::  Yes, Verbal Permission Granted  Name::     Clair Gulling  Agency::  [facility]  Relationship::  Children  Contact Information:     Housing/Transportation Living arrangements for the past 2 months:  [place] of Information:  Patient, Adult Children Patient Interpreter Needed:  None Criminal Activity/Legal Involvement Pertinent to Current Situation/Hospitalization:  No - Comment as needed Significant Relationships:  Adult Children, Other Family Members Lives with:  Self, Facility Resident Do you feel safe going back to the place where you live?  Yes Need for family participation in patient care:  No (Coment)  Care giving concerns:  Patient has been living at [facility] since May and is happy about care received there.   Social Worker assessment / plan:  CSW met with patient and patient's children at bedside to discuss discharge plan and return to [place]. Patient confirmed that she wanted to go back to facility when medically ready. Patient reported that family will provide transport. CSW completed referral and sent to facility. CSW to follow to facilitate discharge when medically ready.  Employment status:  Retired Forensic scientist:  Medicare PT Recommendations:  [place] / Referral to community resources:     Patient/Family's Response to care:  Patient agreeable to return to SNF.  Patient/Family's Understanding of and Emotional Response to Diagnosis, Current Treatment, and Prognosis:  Patient aware that she requires increased care and support at short term rehab at this  time. Patient indicated that she is in some pain, but hopeful that it will ease up over time. Patient and family appreciative of CSW assistance in coordinating discharge.   Emotional Assessment Appearance:  Appears stated age Attitude/Demeanor/Rapport:    Affect (typically observed):  Appropriate Orientation:  Oriented to Self, Oriented to Place, Oriented to  Time, Oriented to Situation Alcohol / Substance use:  Not Applicable Psych involvement (Current and /or in the community):  No (Comment)  Discharge Needs  Concerns to be addressed:  Care Coordination Readmission within the last 30 days:  No Current discharge risk:  Physical Impairment Barriers to Discharge:  Continued Medical Work up, [place], [place] 04/25/2017, 1:52 PM

## 2017-04-25 NOTE — NC FL2 (Signed)
[place] MEDICAID FL2 LEVEL OF CARE SCREENING TOOL     IDENTIFICATION  Patient Name: Sharon Gilmore Birthdate: August 25, 1933 Sex: female Admission Date (Current Location): 04/23/2017  [facility] [place]   Facility and Address:  [facility] [place], [address], [place], [place] [address]      Provider Number: [id number]  Attending Physician Name and Address:  Maeola HarmanStern, Joseph, MD  Relative Name and Phone Number:       Current Level of Care: Hospital Recommended Level of Care: Skilled Nursing Facility Prior Approval Number:    Date Approved/Denied:   PASRR Number: [id number] [id number]  Discharge Plan: SNF    Current Diagnoses: Patient Active Problem List   Diagnosis Date Noted  . Lumbar spinal stenosis 04/23/2017  . DOE (dyspnea on exertion) 03/08/2017  . AV block, 2nd degree 03/08/2017  . Essential hypertension 03/08/2017  . Family history of coronary artery disease in brother 03/08/2017  . History of asthma 03/08/2017  . AVB (atrioventricular block) 03/08/2017  . Abdominal pain 01/04/2017  . Spinal stenosis 01/04/2017    Orientation RESPIRATION BLADDER Height & Weight     Self, Time, Situation, Place  Normal Continent Weight: 179 lb 14.3 oz (81.6 kg) Height:  5\' 4"  (162.6 cm)  BEHAVIORAL SYMPTOMS/MOOD NEUROLOGICAL BOWEL NUTRITION STATUS      Continent    AMBULATORY STATUS COMMUNICATION OF NEEDS Skin   Limited Assist Verbally Surgical wounds, Wound Vac                       Personal Care Assistance Level of Assistance  Bathing, Dressing Bathing Assistance: Limited assistance   Dressing Assistance: Limited assistance     Functional Limitations Info             SPECIAL CARE FACTORS FREQUENCY  PT (By licensed PT), OT (By licensed OT)     PT Frequency: 5x/wk OT Frequency: 5x/wk            Contractures      Additional Factors Info  Code Status, Allergies Code Status Info: Full Allergies Info: Celebrex  Celecoxib, Codeine, Doxycycline, Meloxicam, Nortriptyline, Percocet Oxycodone-acetaminophen, Penicillins           Current Medications (04/25/2017):  This is the current hospital active medication list Current Facility-Administered Medications  Medication Dose Route Frequency Provider Last Rate Last Dose  . 0.9 %  sodium chloride infusion  250 mL Intravenous Continuous Maeola HarmanStern, Joseph, MD   Stopped at 04/25/17 1144  . acetaminophen (TYLENOL) tablet 650 mg  650 mg Oral Q4H PRN Maeola HarmanStern, Joseph, MD   650 mg at 04/24/17 1655   Or  . acetaminophen (TYLENOL) suppository 650 mg  650 mg Rectal Q4H PRN Maeola HarmanStern, Joseph, MD      . alum & mag hydroxide-simeth (MAALOX/MYLANTA) 200-200-20 MG/5ML suspension 30 mL  30 mL Oral Q6H PRN Maeola HarmanStern, Joseph, MD      . aspirin EC tablet 81 mg  81 mg Oral QHS Maeola HarmanStern, Joseph, MD   81 mg at 04/24/17 2310  . atorvastatin (LIPITOR) tablet 20 mg  20 mg Oral QHS Maeola HarmanStern, Joseph, MD   20 mg at 04/24/17 2310  . bisacodyl (DULCOLAX) suppository 10 mg  10 mg Rectal Daily PRN Maeola HarmanStern, Joseph, MD      . cholecalciferol (VITAMIN D) tablet 2,000 Units  2,000 Units Oral Daily Maeola HarmanStern, Joseph, MD   2,000 Units at 04/25/17 0945  . dexamethasone (DECADRON) injection 10 mg  10 mg Intravenous Q6H  Donalee Citrin, MD   10 mg at 04/25/17 0905  . dextrose 5 % and 0.45 % NaCl with KCl 20 mEq/L infusion   Intravenous Continuous Maeola Harman, MD   Stopped at 04/25/17 1144  . docusate sodium (COLACE) capsule 100 mg  100 mg Oral BID Maeola Harman, MD   100 mg at 04/25/17 0945  . enalapril (VASOTEC) tablet 20 mg  20 mg Oral Daily Maeola Harman, MD   20 mg at 04/25/17 0945  . gabapentin (NEURONTIN) capsule 300 mg  300 mg Oral QHS Maeola Harman, MD   300 mg at 04/24/17 2309  . hydrochlorothiazide (HYDRODIURIL) tablet 25 mg  25 mg Oral Daily Maeola Harman, MD   25 mg at 04/25/17 0945  . HYDROcodone-acetaminophen (NORCO/VICODIN) 5-325 MG per tablet 1-2 tablet  1-2 tablet Oral Q4H PRN Maeola Harman, MD   2 tablet at  04/25/17 0528  . menthol-cetylpyridinium (CEPACOL) lozenge 3 mg  1 lozenge Oral PRN Maeola Harman, MD       Or  . phenol [facility]) mouth spray 1 spray  1 spray Mouth/Throat PRN Maeola Harman, MD      . methocarbamol (ROBAXIN) tablet 500 mg  500 mg Oral Q6H PRN Maeola Harman, MD   500 mg at 04/25/17 1107   Or  . methocarbamol (ROBAXIN) 500 mg in dextrose 5 % 50 mL IVPB  500 mg Intravenous Q6H PRN Maeola Harman, MD      . metoprolol tartrate (LOPRESSOR) tablet 50 mg  50 mg Oral BID Maeola Harman, MD   50 mg at 04/25/17 0945  . morphine 2 MG/ML injection 2 mg  2 mg Intravenous Q2H PRN Maeola Harman, MD   2 mg at 04/25/17 1315  . multivitamin (PROSIGHT) tablet 1 tablet  1 tablet Oral BID Maeola Harman, MD   1 tablet at 04/25/17 0945  . ondansetron (ZOFRAN) tablet 4 mg  4 mg Oral Q6H PRN Maeola Harman, MD       Or  . ondansetron [facility]) injection 4 mg  4 mg Intravenous Q6H PRN Maeola Harman, MD      . pantoprazole (PROTONIX) EC tablet 40 mg  40 mg Oral BID Donalee Citrin, MD   40 mg at 04/25/17 0945  . polyvinyl alcohol (LIQUIFILM TEARS) 1.4 % ophthalmic solution 1 drop  1 drop Both Eyes QID PRN Maeola Harman, MD      . senna-docusate (Senokot-S) tablet 1 tablet  1 tablet Oral QHS PRN Maeola Harman, MD      . sodium chloride flush (NS) 0.9 % injection 3 mL  3 mL Intravenous Q12H Maeola Harman, MD   3 mL at 04/25/17 0947  . sodium chloride flush (NS) 0.9 % injection 3 mL  3 mL Intravenous PRN Maeola Harman, MD      . sodium phosphate (FLEET) 7-19 GM/118ML enema 1 enema  1 enema Rectal Once PRN Maeola Harman, MD      . traMADol Janean Sark) tablet 50 mg  50 mg Oral Q6H PRN Maeola Harman, MD   50 mg at 04/24/17 1155  . vancomycin (VANCOCIN) 1,500 mg in sodium chloride 0.9 % 500 mL IVPB  1,500 mg Intravenous Q24H Earnie Larsson, [facility]   Stopped at 04/25/17 1610  . zolpidem (AMBIEN) tablet 5 mg  5 mg Oral QHS PRN Maeola Harman, MD         Discharge Medications: Please see discharge summary for a list of  discharge medications.  Relevant Imaging Results:  Relevant Lab Results:   Additional  Information SS#: [id number]  Baldemar Lenis, LCSW

## 2017-04-25 NOTE — Progress Notes (Signed)
Patient ID: Sharon Gilmore, female   DOB: [birth year], 83 y.o.   MRN: [id number] Patient afebrile stable blood pressure 119/47.  Complaining of leftL3 radicular smptoms with pain hip lateral anterior quad to her knee.  Incision clean dry and intact  Will start IV Decadron and continue to mobilize with physical and occupational therapy

## 2017-04-26 MED ORDER — GABAPENTIN 300 MG PO CAPS
300.0000 mg | ORAL_CAPSULE | Freq: Three times a day (TID) | ORAL | Status: DC
  Administered 2017-04-26 – 2017-04-27 (×4): 300 mg via ORAL
  Filled 2017-04-26 (×4): qty 1

## 2017-04-26 MED ORDER — HYDROMORPHONE HCL 2 MG PO TABS
2.0000 mg | ORAL_TABLET | ORAL | Status: DC | PRN
  Administered 2017-04-27: 4 mg via ORAL
  Filled 2017-04-26: qty 2

## 2017-04-26 MED ORDER — HYDRALAZINE HCL 20 MG/ML IJ SOLN
10.0000 mg | Freq: Once | INTRAMUSCULAR | Status: AC
  Administered 2017-04-26: 10 mg via INTRAVENOUS
  Filled 2017-04-26: qty 1

## 2017-04-26 MED FILL — Sodium Chloride IV Soln 0.9%: INTRAVENOUS | Qty: 1000 | Status: AC

## 2017-04-26 MED FILL — Heparin Sodium (Porcine) Inj 1000 Unit/ML: INTRAMUSCULAR | Qty: 30 | Status: AC

## 2017-04-26 NOTE — Progress Notes (Signed)
Physical Therapy Treatment Patient Details Name: Sharon Gilmore MRN: [id number] DOB: 1934-03-12 Today's Date: 04/26/2017    History of Present Illness Pt is a 83 y/o female s/p decompression and fusion without interbody grafts of L2-5. Pt has a past medical history including Arthritis; Asthma; Carpal tunnel syndrome; Depression; Erythema nodosum; Hypertension; Swallowing disorder; and Systolic murmur; Pacemaker Implant (03/09/2017); LEFT HEART CATH AND CORONARY ANGIOGRAPHY (03/15/2017); and Bilateral carpal tunnel release.    PT Comments    Patient seen for mobility progression. Tolerated well, with less pain. Improvements noted in functional LLE. Will continue to see and progress as tolerated.    Follow Up Recommendations  SNF     Equipment Recommendations  None recommended by PT    Recommendations for Other Services       Precautions / Restrictions Precautions Precautions: Back Precaution Booklet Issued: Yes (comment) Precaution Comments: Pt able to recall 3/3 back precautions Required Braces or Orthoses: Spinal Brace Spinal Brace: Lumbar corset;Applied in sitting position Restrictions Weight Bearing Restrictions: No    Mobility  Bed Mobility Overal bed mobility: Needs Assistance Bed Mobility: Rolling;Sidelying to Sit Rolling: Supervision Sidelying to sit: Supervision       General bed mobility comments: increased time to perform, VCs for hand placement and positioning  Transfers Overall transfer level: Needs assistance Equipment used: Rolling walker (2 wheeled) Transfers: Sit to/from Stand Sit to Stand: Min guard         General transfer comment: min guard for stability VCs for positioning  Ambulation/Gait Ambulation/Gait assistance: Min guard Ambulation Distance (Feet): 110 Feet Assistive device: Rolling walker (2 wheeled) Gait Pattern/deviations: Step-to pattern;Step-through pattern;Decreased step length - right;Decreased step length - left;Decreased  stride length;Decreased dorsiflexion - left Gait velocity: decreased Gait velocity interpretation: Below normal speed for age/gender General Gait Details: Some modest improvements noted in LLE foot drop, increased active dorsiflexion noted. Min guard for stability   Stairs            Wheelchair Mobility    Modified Rankin (Stroke Patients Only)       Balance Overall balance assessment: Needs assistance Sitting-balance support: No upper extremity supported;Feet supported Sitting balance-Leahy Scale: Good     Standing balance support: Bilateral upper extremity supported;During functional activity Standing balance-Leahy Scale: Fair Standing balance comment: able to release UE support                            Cognition Arousal/Alertness: Awake/alert Behavior During Therapy: WFL for tasks assessed/performed Overall Cognitive Status: Within Functional Limits for tasks assessed                                        Exercises      General Comments        Pertinent Vitals/Pain Pain Assessment: Faces Faces Pain Scale: Hurts little more Pain Location: back and R LE Pain Descriptors / Indicators: Sore Pain Intervention(s): Monitored during session    Home Living                      Prior Function            PT Goals (current goals can now be found in the care plan section) Acute Rehab PT Goals Patient Stated Goal: to walk without DME by Thanksgiving PT Goal Formulation: With patient/family Time For Goal Achievement: 05/08/17 Potential to  Achieve Goals: Good Progress towards PT goals: Progressing toward goals    Frequency    Min 5X/week      PT Plan Current plan remains appropriate    Co-evaluation              AM-PAC PT "6 Clicks" Daily Activity  Outcome Measure  Difficulty turning over in bed (including adjusting bedclothes, sheets and blankets)?: A Lot Difficulty moving from lying on back to sitting  on the side of the bed? : A Lot Difficulty sitting down on and standing up from a chair with arms (e.g., wheelchair, bedside commode, etc,.)?: Unable Help needed moving to and from a bed to chair (including a wheelchair)?: A Little Help needed walking in hospital room?: A Little Help needed climbing 3-5 steps with a railing? : A Lot 6 Click Score: 13    End of Session Equipment Utilized During Treatment: Gait belt;Back brace Activity Tolerance: Patient limited by fatigue;Patient limited by pain Patient left: in chair;with call bell/phone within reach Nurse Communication: Mobility status PT Visit Diagnosis: Pain;Other abnormalities of gait and mobility (R26.89) Pain - part of body:  (back)     Time: [id number] PT Time Calculation (min) (ACUTE ONLY): 18 min  Charges:  $Gait Training: 8-22 mins                    G Codes:       Charlotte Crumbevon Firmin Belisle, PT DPT  Board Certified Neurologic Specialist [phone]    Fabio AsaDevon J Nalaysia Manganiello 04/26/2017, 10:34 AM

## 2017-04-26 NOTE — Progress Notes (Signed)
Patient ID: Sharon Gilmore, female   DOB: 08-Feb-1934, 83 y.o.   MRN: [id number]  Per DrStern, d/c Decadron and morphine. Will increase Neurontin 300mg  to TID and use Dilaudid 2mg  po for moderate to severe pain. Continue Robaxin and Norco (changing to Dilaudid if it is found to provide better relief). Orders entered.

## 2017-04-26 NOTE — Progress Notes (Addendum)
Subjective: Patient reports "i feel stronger, ad all the nerve pain is gone...but both thighs just ache terribly when I sit up"  Objective: Vital signs in last 24 hours: Temp:  [97.7 F (36.5 C)-98.4 F (36.9 C)] 97.8 F (36.6 C) (09/10 0624) Pulse Rate:  [59-73] 60 (09/10 0624) Resp:  [16-20] 18 (09/10 0624) BP: (148-193)/(62-84) 148/62 (09/10 0624) SpO2:  [94 %-100 %] 100 % (09/10 0545)  Intake/Output from previous day: 09/09 0701 - 09/10 0700 In: -  Out: 105 [Drains:105] Intake/Output this shift: No intake/output data recorded.  Alert, conversant. Reports unrelenting bilateral thigh pain, worse with sitting up in bed. She has walked in the hallway onec and made several bathroom trips. She remains weaker in the left on dorsiflexion, but strength is slightly improved. She denies all "nerve pain" experienced preoperatively, noting minimal incisional pain, but significant bilat thigh pain. Incision without erythema, swelling, or drainage. Hemovac with minimal o/p overnight. No BM yet, but no belly distention or tenderness, and little appetite.  Blood pressure down to 148/62.   Decadron 10mg  q6 IV began yesterday (3 doses so far). Pt notes no change. Morphine 2mg  IV "I don't think it's doing much" pt states.  Norco and Robaxin are tolerated well, but offer little relief.   Lab Results: No results for input(s): WBC, HGB, HCT, PLT in the last 72 hours. BMET  Recent Labs  04/25/17 0426  NA 137  K 3.6  CL 103  CO2 27  GLUCOSE 140*  BUN 5*  CREATININE 0.51  CALCIUM 8.4*    Studies/Results: No results found.  Assessment/Plan: improving  LOS: 3 days  Hemovac pulled and DSD to site (replaced honeycomb) on Dr. Fredrich BirksStern's order. Will d/w DrStern possible med changes. Will also ask her cardiologist, Dr. Johney FrameAllred, to consult.    PoteatArlys John, Brian 04/26/2017, 7:50 AM    We have adjusted pain meds to help with better analgesia.  Continue PT and mobilizing patient.

## 2017-04-27 MED ORDER — HYDROMORPHONE HCL 2 MG PO TABS: 2.0000 mg | ORAL_TABLET | ORAL | 0 refills | Status: DC | PRN

## 2017-04-27 MED ORDER — TRAMADOL HCL 50 MG PO TABS: 50.0000 mg | ORAL_TABLET | Freq: Four times a day (QID) | ORAL | 0 refills | Status: DC | PRN

## 2017-04-27 NOTE — Progress Notes (Signed)
Report call placed to [facility]. Awaiting son to pick up.

## 2017-04-27 NOTE — Progress Notes (Signed)
Discharge orders and follow up appointments reviewed with pt and son. Teaching and prescription  provided. Pt v/s stable. Pt escorted from unit via wheelchair.  Erich Montanerystal Kyreese Chio, RN

## 2017-04-27 NOTE — Discharge Summary (Signed)
Physician Discharge Summary  Patient ID: Sharon Gilmore MRN: [id number] DOB/AGE: 1933/09/08 83 y.o.  Admit date: 04/23/2017 Discharge date: 04/27/2017  Admission Diagnoses: Spinal stenosis, Lumbar region without neurogenic claudication; Radiculopathy, herniated disc Lumbar region, foot drop, scoliosis, spondylolisthesis L 2 - L 5 levels     Discharge Diagnoses: Spinal stenosis, Lumbar region without neurogenic claudication; Radiculopathy, herniated disc Lumbar region, foot drop, scoliosis, spondylolisthesis L 2 - L 5 levels s/p L2 to L5 Decompression/Fusion/Possible interbodies (N/A) - L2 to L5 Decompression/Fusion/Possible interbodies Decompression L 2 -L 5 levels with pedicle screw fixation L 2 - L 5 levels with posterolateral arthrodesis L 2 - L 5 levels       Active Problems:   Lumbar spinal stenosis   Discharged Condition: good  Hospital Course: Sharon Gilmore was admitted for surgery with dx stenosis,fot drop,  scoliosis, and radiculopathy. Following uncomplicated decompression and fusion L2-5 levels, she recovered nicely and transferred to [facility] for nursing care and therapies. She has mobilized slowly, but steadily.   Consults: cardiology  Significant Diagnostic Studies: radiology: X-Ray: intra-op  Treatments: surgery: L2 to L5 Decompression/Fusion/Possible interbodies (N/A) - L2 to L5 Decompression/Fusion/Possible interbodies Decompression L 2 -L 5 levels with pedicle screw fixation L 2 - L 5 levels with posterolateral arthrodesis L 2 - L 5 levels     Discharge Exam: Blood pressure (!) 171/63, pulse 60, temperature (!) 97.5 F (36.4 C), temperature source Oral, resp. rate 18, height  (1.626 m), weight 81.6 kg (179 lb 14.3 oz), SpO2 98 %. Alert, sitting in chair. Reports persistent bilateral thigh pain. Strength is good BLE with exception of left foot drop, stable.  She was able to walk in the hallway yesterday.  She reports some benefit to increased Neurontin dosing.       Disposition: 03-Skilled Nursing Facility Pt verbalizes understanding of d/c instructions. Office f/u in 3-4 weeks. Rx's to chart for SNF  Discharge Instructions    Diet - low sodium heart healthy    Complete by:  As directed    Increase activity slowly    Complete by:  As directed      Allergies as of 04/27/2017      Reactions   Celebrex [celecoxib]    LEG ACHING   Codeine Nausea And Vomiting   Doxycycline Swelling   Facial swelling   Meloxicam    TUMMY ACHE, INEFFECTIVE   Nortriptyline Other (See Comments)   unknown   Percocet [oxycodone-acetaminophen] Nausea And Vomiting   Penicillins Rash   Has patient had a PCN reaction causing immediate rash, facial/tongue/throat swelling, SOB or lightheadedness with hypotension: No Has patient had a PCN reaction causing severe rash involving mucus membranes or skin necrosis: No Has patient had a PCN reaction that required hospitalization No Has patient had a PCN reaction occurring within the last 10 years: No If all of the above answers are "NO", then may proceed with Cephalosporin use.      Medication List    TAKE these medications   aspirin EC 81 MG tablet Take 81 mg by mouth at bedtime.   atorvastatin 20 MG tablet Commonly known as:  LIPITOR Take 20 mg by mouth at bedtime.   enalapril 20 MG tablet Commonly known as:  VASOTEC Take 20 mg by mouth daily.   gabapentin 100 MG capsule Commonly known as:  NEURONTIN Take 3 capsules (300 mg total) by mouth at bedtime.   hydrochlorothiazide 25 MG tablet Commonly known as:  HYDRODIURIL Take 1 tablet (25 mg  total) by mouth daily.   HYDROmorphone 2 MG tablet Commonly known as:  DILAUDID Take 1-2 tablets (2-4 mg total) by mouth every 4 (four) hours as needed for moderate pain or severe pain.   hydroxypropyl methylcellulose / hypromellose 2.5 % ophthalmic solution Commonly known as:  ISOPTO TEARS / GONIOVISC Place 1 drop into both eyes 4 (four) times daily as needed for dry  eyes.   metoprolol tartrate 50 MG tablet Commonly known as:  LOPRESSOR Take 1 tablet (50 mg total) by mouth 2 (two) times daily.   PRESERVISION AREDS 2 Caps Take 1 capsule by mouth 2 (two) times daily.   traMADol 50 MG tablet Commonly known as:  ULTRAM Take 50 mg by mouth every 6 (six) hours as needed for moderate pain. What changed:  Another medication with the same name was added. Make sure you understand how and when to take each.   traMADol 50 MG tablet Commonly known as:  ULTRAM Take 1 tablet (50 mg total) by mouth every 6 (six) hours as needed for moderate pain. What changed:  You were already taking a medication with the same name, and this prescription was added. Make sure you understand how and when to take each.   Vitamin D3 2000 units Tabs Take 2,000 Units by mouth daily.            Discharge Care Instructions        Start     Ordered   04/27/17 0000  traMADol (ULTRAM) 50 MG tablet  Every 6 hours PRN     04/27/17 0803   04/27/17 0000  HYDROmorphone (DILAUDID) 2 MG tablet  Every 4 hours PRN     04/27/17 0803   04/27/17 0000  Increase activity slowly     04/27/17 0803   04/27/17 0000  Diet - low sodium heart healthy     04/27/17 0803     Contact information for after-discharge care    Destination    [facility] .   Specialty:  Skilled Nursing Facility Contact information: [address] [place] [id number] [phone]              Signed: Georgiann Cockeroteat, Oaklee Sunga 04/27/2017, 8:23 AM

## 2017-04-27 NOTE — Care Management Note (Signed)
Case Management Note  Patient Details  Name: Sharon Gilmore MRN: [id number] Date of Birth: [birth year]  Subjective/Objective:                    Action/Plan: Pt [facility]. No further needs per CM.   Expected Discharge Date:  04/27/17               Expected Discharge Plan:  Skilled Nursing Facility  In-House Referral:  Clinical Social Work  Discharge planning Services     Post Acute Care Choice:    Choice offered to:     DME Arranged:    DME Agency:     HH Arranged:    HH Agency:     Status of Service:  Completed, signed off  If discussed at [employer] Meetings, dates discussed:    Additional Comments:  Kermit BaloKelli F Eliakim Tendler, RN 04/27/2017, 10:19 AM

## 2017-04-27 NOTE — Care Management Important Message (Signed)
Important Message  Patient Details  Name: [name] Wherry MRN: [id number] Date of Birth: Oct 21, 1933   Medicare Important Message Given:  Yes    Erynne Kealey 04/27/2017, 11:12 AM

## 2017-04-27 NOTE — Progress Notes (Addendum)
Clinical Social Worker facilitated patient discharge including contacting patient family and facility to confirm patient discharge plans.  Clinical information faxed to facility and family agreeable with plan.  CSW spoke to patients son Rosaria FerriesJim Lahm and he stated he will transport patient back to facility.  RN to call [phone] (pt will be in room 7003 in [place]) for report prior to discharge.  Clinical Social Worker will sign off for now as social work intervention is no longer needed. Please consult us again if new need arises.  Marrianne MoodAshley Zygmund Passero, MSW, [employer]CSWA [phone]

## 2017-04-27 NOTE — Progress Notes (Addendum)
Subjective: Patient reports "I've been hurting since about 4...my back to my legs"  Objective: Vital signs in last 24 hours: Temp:  [97.5 F (36.4 C)-98.1 F (36.7 C)] 97.5 F (36.4 C) (09/11 0511) Pulse Rate:  [60-61] 60 (09/11 0511) Resp:  [18] 18 (09/11 0511) BP: (118-171)/(48-63) 171/63 (09/11 0511) SpO2:  [94 %-98 %] 98 % (09/11 0511)  Intake/Output from previous day: 09/10 0701 - 09/11 0700 In: 480 [P.O.:480] Out: -  Intake/Output this shift: No intake/output data recorded.  Alert, sitting in chair. Reports persistent bilateral thigh pain. Strength is good BLE with exception of left foot drop, stable.  She was able to walk in the hallway yesterday.  She reports some benefit to increased Neurontin dosing.   Lab Results: No results for input(s): WBC, HGB, HCT, PLT in the last 72 hours. BMET  Recent Labs  04/25/17 0426  NA 137  K 3.6  CL 103  CO2 27  GLUCOSE 140*  BUN 5*  CREATININE 0.51  CALCIUM 8.4*    Studies/Results: No results found.  Assessment/Plan: Improving slowly  LOS: 4 days  Hopeful for SNF placement this afternoon. She will request Dilaudid instead of Tramadol in hopes for better pain relief.    Georgiann Cockeroteat, Brian 04/27/2017, 7:17 AM    Patient is making slow, but steady progress.

## 2017-05-02 ENCOUNTER — Inpatient Hospital Stay (HOSPITAL_COMMUNITY): Payer: Medicare Other

## 2017-05-02 ENCOUNTER — Inpatient Hospital Stay (HOSPITAL_COMMUNITY)
Admission: EM | Admit: 2017-05-02 | Discharge: 2017-05-10 | DRG: 871 | Disposition: A | Discharge status: Skilled Nursing Facility | Payer: Medicare Other | Attending: Family Medicine | Admitting: Family Medicine

## 2017-05-02 DIAGNOSIS — Z833 Family history of diabetes mellitus: Secondary | ICD-10-CM | POA: Diagnosis not present

## 2017-05-02 DIAGNOSIS — Z4659 Encounter for fitting and adjustment of other gastrointestinal appliance and device: Secondary | ICD-10-CM

## 2017-05-02 DIAGNOSIS — Z87891 Personal history of nicotine dependence: Secondary | ICD-10-CM

## 2017-05-02 DIAGNOSIS — I4891 Unspecified atrial fibrillation: Secondary | ICD-10-CM | POA: Diagnosis present

## 2017-05-02 DIAGNOSIS — E861 Hypovolemia: Secondary | ICD-10-CM | POA: Diagnosis present

## 2017-05-02 DIAGNOSIS — E785 Hyperlipidemia, unspecified: Secondary | ICD-10-CM | POA: Diagnosis present

## 2017-05-02 DIAGNOSIS — R140 Abdominal distension (gaseous): Secondary | ICD-10-CM

## 2017-05-02 DIAGNOSIS — Z8249 Family history of ischemic heart disease and other diseases of the circulatory system: Secondary | ICD-10-CM

## 2017-05-02 DIAGNOSIS — B9620 Unspecified Escherichia coli [E. coli] as the cause of diseases classified elsewhere: Secondary | ICD-10-CM | POA: Diagnosis present

## 2017-05-02 DIAGNOSIS — N170 Acute kidney failure with tubular necrosis: Secondary | ICD-10-CM | POA: Diagnosis present

## 2017-05-02 DIAGNOSIS — B259 Cytomegaloviral disease, unspecified: Secondary | ICD-10-CM

## 2017-05-02 DIAGNOSIS — K567 Ileus, unspecified: Secondary | ICD-10-CM | POA: Diagnosis present

## 2017-05-02 DIAGNOSIS — R0689 Other abnormalities of breathing: Secondary | ICD-10-CM

## 2017-05-02 DIAGNOSIS — Z823 Family history of stroke: Secondary | ICD-10-CM

## 2017-05-02 DIAGNOSIS — N3080 Other cystitis without hematuria: Secondary | ICD-10-CM | POA: Diagnosis present

## 2017-05-02 DIAGNOSIS — K529 Noninfective gastroenteritis and colitis, unspecified: Secondary | ICD-10-CM | POA: Diagnosis not present

## 2017-05-02 DIAGNOSIS — K56609 Unspecified intestinal obstruction, unspecified as to partial versus complete obstruction: Secondary | ICD-10-CM | POA: Diagnosis not present

## 2017-05-02 DIAGNOSIS — Z981 Arthrodesis status: Secondary | ICD-10-CM | POA: Diagnosis not present

## 2017-05-02 DIAGNOSIS — R6521 Severe sepsis with septic shock: Secondary | ICD-10-CM | POA: Diagnosis present

## 2017-05-02 DIAGNOSIS — E872 Acidosis: Secondary | ICD-10-CM | POA: Diagnosis present

## 2017-05-02 DIAGNOSIS — E871 Hypo-osmolality and hyponatremia: Secondary | ICD-10-CM | POA: Diagnosis not present

## 2017-05-02 DIAGNOSIS — I10 Essential (primary) hypertension: Secondary | ICD-10-CM | POA: Diagnosis present

## 2017-05-02 DIAGNOSIS — J45909 Unspecified asthma, uncomplicated: Secondary | ICD-10-CM | POA: Diagnosis present

## 2017-05-02 DIAGNOSIS — Z95810 Presence of automatic (implantable) cardiac defibrillator: Secondary | ICD-10-CM | POA: Diagnosis not present

## 2017-05-02 DIAGNOSIS — R1010 Upper abdominal pain, unspecified: Secondary | ICD-10-CM | POA: Diagnosis not present

## 2017-05-02 DIAGNOSIS — A0839 Other viral enteritis: Secondary | ICD-10-CM

## 2017-05-02 DIAGNOSIS — I441 Atrioventricular block, second degree: Secondary | ICD-10-CM

## 2017-05-02 DIAGNOSIS — E876 Hypokalemia: Secondary | ICD-10-CM | POA: Diagnosis not present

## 2017-05-02 DIAGNOSIS — Z9049 Acquired absence of other specified parts of digestive tract: Secondary | ICD-10-CM

## 2017-05-02 DIAGNOSIS — R6520 Severe sepsis without septic shock: Secondary | ICD-10-CM

## 2017-05-02 DIAGNOSIS — Z7982 Long term (current) use of aspirin: Secondary | ICD-10-CM

## 2017-05-02 DIAGNOSIS — K559 Vascular disorder of intestine, unspecified: Secondary | ICD-10-CM | POA: Diagnosis present

## 2017-05-02 DIAGNOSIS — R109 Unspecified abdominal pain: Secondary | ICD-10-CM

## 2017-05-02 DIAGNOSIS — A419 Sepsis, unspecified organism: Secondary | ICD-10-CM | POA: Diagnosis present

## 2017-05-02 DIAGNOSIS — E8809 Other disorders of plasma-protein metabolism, not elsewhere classified: Secondary | ICD-10-CM | POA: Diagnosis not present

## 2017-05-02 DIAGNOSIS — N179 Acute kidney failure, unspecified: Secondary | ICD-10-CM

## 2017-05-02 DIAGNOSIS — I2510 Atherosclerotic heart disease of native coronary artery without angina pectoris: Secondary | ICD-10-CM | POA: Diagnosis present

## 2017-05-02 DIAGNOSIS — N390 Urinary tract infection, site not specified: Secondary | ICD-10-CM

## 2017-05-02 DIAGNOSIS — K631 Perforation of intestine (nontraumatic): Secondary | ICD-10-CM

## 2017-05-02 DIAGNOSIS — I480 Paroxysmal atrial fibrillation: Secondary | ICD-10-CM

## 2017-05-02 LAB — PROTIME-INR
INR: 1.52
PROTHROMBIN TIME: 18.2 s — AB (ref 11.4–15.2)

## 2017-05-02 LAB — URINALYSIS, ROUTINE W REFLEX MICROSCOPIC
GLUCOSE, UA: NEGATIVE mg/dL
Hgb urine dipstick: NEGATIVE
Ketones, ur: NEGATIVE mg/dL
Nitrite: NEGATIVE
Protein, ur: 30 mg/dL — AB
SPECIFIC GRAVITY, URINE: 1.017 (ref 1.005–1.030)
pH: 5 (ref 5.0–8.0)

## 2017-05-02 LAB — CBC WITH DIFFERENTIAL/PLATELET
BASOS PCT: 0 %
Basophils Absolute: 0 10*3/uL (ref 0.0–0.1)
EOS PCT: 0 %
Eosinophils Absolute: 0 10*3/uL (ref 0.0–0.7)
HCT: 40.4 % (ref 36.0–46.0)
HEMOGLOBIN: 13.9 g/dL (ref 12.0–15.0)
LYMPHS PCT: 4 %
Lymphs Abs: 1.3 10*3/uL (ref 0.7–4.0)
MCH: 29.4 pg (ref 26.0–34.0)
MCHC: 34.4 g/dL (ref 30.0–36.0)
MCV: 85.4 fL (ref 78.0–100.0)
MONO ABS: 1.9 10*3/uL — AB (ref 0.1–1.0)
Monocytes Relative: 6 %
NEUTROS PCT: 90 %
Neutro Abs: 28.9 10*3/uL — ABNORMAL HIGH (ref 1.7–7.7)
PLATELETS: 346 10*3/uL (ref 150–400)
RBC: 4.73 MIL/uL (ref 3.87–5.11)
RDW: 15.9 % — ABNORMAL HIGH (ref 11.5–15.5)
WBC: 32.1 10*3/uL — AB (ref 4.0–10.5)

## 2017-05-02 LAB — COMPREHENSIVE METABOLIC PANEL
ALK PHOS: 156 U/L — AB (ref 38–126)
ALT: 272 U/L — AB (ref 14–54)
AST: 64 U/L — AB (ref 15–41)
Albumin: 2.9 g/dL — ABNORMAL LOW (ref 3.5–5.0)
Anion gap: 16 — ABNORMAL HIGH (ref 5–15)
BUN: 34 mg/dL — ABNORMAL HIGH (ref 6–20)
CHLORIDE: 94 mmol/L — AB (ref 101–111)
CO2: 21 mmol/L — AB (ref 22–32)
Calcium: 8.5 mg/dL — ABNORMAL LOW (ref 8.9–10.3)
Creatinine, Ser: 2.11 mg/dL — ABNORMAL HIGH (ref 0.44–1.00)
GFR calc non Af Amer: 21 mL/min — ABNORMAL LOW (ref >60)
GFR, EST AFRICAN AMERICAN: 24 mL/min — AB (ref >60)
GLUCOSE: 157 mg/dL — AB (ref 65–99)
Potassium: 3.8 mmol/L (ref 3.5–5.1)
SODIUM: 131 mmol/L — AB (ref 135–145)
Total Bilirubin: 0.9 mg/dL (ref 0.3–1.2)
Total Protein: 6.4 g/dL — ABNORMAL LOW (ref 6.5–8.1)

## 2017-05-02 LAB — LACTIC ACID, PLASMA
LACTIC ACID, VENOUS: 3.3 mmol/L — AB (ref 0.5–1.9)
Lactic Acid, Venous: 3.9 mmol/L (ref 0.5–1.9)

## 2017-05-02 LAB — I-STAT CG4 LACTIC ACID, ED: Lactic Acid, Venous: 4.14 mmol/L (ref 0.5–1.9)

## 2017-05-02 LAB — APTT: aPTT: 32 seconds (ref 24–36)

## 2017-05-02 LAB — LIPASE, BLOOD: Lipase: 24 U/L (ref 11–51)

## 2017-05-02 MED ORDER — SODIUM CHLORIDE 0.9 % IV BOLUS (SEPSIS)
1000.0000 mL | Freq: Once | INTRAVENOUS | Status: AC
  Administered 2017-05-02: 1000 mL via INTRAVENOUS

## 2017-05-02 MED ORDER — DEXTROSE 5 % IV SOLN
2.0000 g | INTRAVENOUS | Status: DC
  Filled 2017-05-02: qty 2

## 2017-05-02 MED ORDER — SODIUM CHLORIDE 0.9 % IV SOLN
INTRAVENOUS | Status: DC
  Administered 2017-05-02 – 2017-05-03 (×2): via INTRAVENOUS

## 2017-05-02 MED ORDER — SODIUM CHLORIDE 0.9 % IV BOLUS (SEPSIS)
500.0000 mL | Freq: Once | INTRAVENOUS | Status: AC
  Administered 2017-05-02: 500 mL via INTRAVENOUS

## 2017-05-02 MED ORDER — ONDANSETRON HCL 4 MG/2ML IJ SOLN
4.0000 mg | Freq: Once | INTRAMUSCULAR | Status: AC
  Administered 2017-05-02: 4 mg via INTRAVENOUS
  Filled 2017-05-02: qty 2

## 2017-05-02 MED ORDER — SODIUM CHLORIDE 0.9 % IV SOLN
0.0000 ug/min | INTRAVENOUS | Status: DC
  Administered 2017-05-02: 20 ug/min via INTRAVENOUS
  Administered 2017-05-03: 35 ug/min via INTRAVENOUS
  Filled 2017-05-02 (×2): qty 1

## 2017-05-02 MED ORDER — DEXTROSE 5 % IV SOLN
1.0000 g | Freq: Once | INTRAVENOUS | Status: AC
  Administered 2017-05-02: 1 g via INTRAVENOUS
  Filled 2017-05-02: qty 10

## 2017-05-02 MED ORDER — FENTANYL CITRATE (PF) 100 MCG/2ML IJ SOLN
50.0000 ug | Freq: Once | INTRAMUSCULAR | Status: AC
  Administered 2017-05-02: 50 ug via INTRAVENOUS
  Filled 2017-05-02 (×2): qty 2

## 2017-05-02 MED ORDER — FENTANYL CITRATE (PF) 100 MCG/2ML IJ SOLN
50.0000 ug | Freq: Once | INTRAMUSCULAR | Status: AC
  Administered 2017-05-02: 50 ug via INTRAVENOUS
  Filled 2017-05-02: qty 2

## 2017-05-02 NOTE — Consult Note (Signed)
Reason for Consult:sepsis, abd pain Referring Physician: Dr Carolanne Grumbling is an 83 y.o. female.  HPI: 83 year old female with history of hypertension, arthritis, asthma and lumbar stenosis status post spinal fusion of L2-L5 on 9/7 was brought to the emergency room for malaise, hypotension, and feeling bad. She also reportedly had abdominal pain. She started having diarrhea which was nonbloody yesterday. She also had increased urinary frequency. She had no fever or chills. She had decreased appetite. She had loose stool in the emergency room which was nonbloody.  She was found to have a lactate of 4 on arrival, acute kidney injury with a creatinine of 2, hypertension, and a white count 32,000 and a strongly positive UA  She has had an open cholecystectomy. She states that she still has her appendix  Past Medical History:  Diagnosis Date  . Abnormal barium swallow 09/1999  . Arthritis   . Asthma   . Carpal tunnel syndrome   . Depression   . Erythema nodosum   . H/O Doppler ultrasound    LEFT LEG 11/1997  . Hypertension   . Impaired fasting glucose   . Presence of permanent cardiac pacemaker    [employer]  . Psoriasis   . Rosacea   . Seizures ([place]) 1980  . Swallowing disorder   . Systolic murmur     Past Surgical History:  Procedure Laterality Date  . BILATERAL CARPAL TUNNEL RELEASE    . CHOLECYSTECTOMY    . COLONOSCOPY  2004  . INCISIONAL HERNIA REPAIR    . LEFT HEART CATH AND CORONARY ANGIOGRAPHY N/A 03/15/2017   Procedure: Left Heart Cath and Coronary Angiography;  Surgeon: Lorretta Harp, MD;  Location: [facility];  Service: Cardiovascular;  Laterality: N/A;  . PACEMAKER IMPLANT N/A 03/09/2017   Procedure: Pacemaker Implant;  Surgeon: Thompson Grayer, MD;  Location: [facility];  Service: Cardiovascular;  Laterality: N/A;  . TONSILLECTOMY      Family History  Problem Relation Age of Onset  . CVA Mother   . Stroke Mother   . Parkinson's disease  Father   . Leukemia Brother   . CVA Maternal Grandmother   . Stroke Maternal Grandmother   . Peripheral vascular disease Maternal Grandfather   . Breast cancer Paternal Grandmother   . Diabetes Paternal Grandmother   . CVA Paternal Grandfather   . Stroke Paternal Grandfather   . Pancreatic cancer Paternal Aunt   . Stroke Paternal Aunt   . Lung cancer Paternal Uncle   . Pancreatic cancer Brother   . Coronary artery disease Brother     Social History:  reports that she quit smoking about 36 years ago. She has never used smokeless tobacco. She reports that she drinks alcohol. She reports that she does not use drugs.  Allergies:  Allergies  Allergen Reactions  . Celebrex [Celecoxib] Other (See Comments)    LEG ACHING   . Codeine Nausea And Vomiting  . Cymbalta [Duloxetine Hcl] Other (See Comments)    Sensitive per MAR  . Doxycycline Swelling and Other (See Comments)    Facial swelling  . Meloxicam Other (See Comments)    TUMMY ACHE, INEFFECTIVE  . Nortriptyline Other (See Comments)    unknown  . Percocet [Oxycodone-Acetaminophen] Nausea And Vomiting  . Penicillins Rash and Other (See Comments)    Has patient had a PCN reaction causing immediate rash, facial/tongue/throat swelling, SOB or lightheadedness with hypotension: No Has patient had a PCN reaction causing severe rash involving  mucus membranes or skin necrosis: No Has patient had a PCN reaction that required hospitalization No Has patient had a PCN reaction occurring within the last 10 years: No If all of the above answers are "NO", then may proceed with Cephalosporin use.    Medications: I have reviewed the patient's current medications.  Results for orders placed or performed during the hospital encounter of 05/02/17 (from the past 48 hour(s))  Urinalysis, Routine w reflex microscopic     Status: Abnormal   Collection Time: 05/02/17 11:41 AM  Result Value Ref Range   Color, Urine AMBER (A) YELLOW    Comment:  BIOCHEMICALS MAY BE AFFECTED BY COLOR   APPearance CLOUDY (A) CLEAR   Specific Gravity, Urine 1.017 1.005 - 1.030   pH 5.0 5.0 - 8.0   Glucose, UA NEGATIVE NEGATIVE mg/dL   Hgb urine dipstick NEGATIVE NEGATIVE   Bilirubin Urine SMALL (A) NEGATIVE   Ketones, ur NEGATIVE NEGATIVE mg/dL   Protein, ur 30 (A) NEGATIVE mg/dL   Nitrite NEGATIVE NEGATIVE   Leukocytes, UA MODERATE (A) NEGATIVE   RBC / HPF 0-5 0 - 5 RBC/hpf   WBC, UA TOO NUMEROUS TO COUNT 0 - 5 WBC/hpf   Bacteria, UA MANY (A) NONE SEEN   Squamous Epithelial / LPF 0-5 (A) NONE SEEN   WBC Clumps PRESENT    Mucus PRESENT    Hyaline Casts, UA PRESENT   Comprehensive metabolic panel     Status: Abnormal   Collection Time: 05/02/17 12:53 PM  Result Value Ref Range   Sodium 131 (L) 135 - 145 mmol/L   Potassium 3.8 3.5 - 5.1 mmol/L   Chloride 94 (L) 101 - 111 mmol/L   CO2 21 (L) 22 - 32 mmol/L   Glucose, Bld 157 (H) 65 - 99 mg/dL   BUN 34 (H) 6 - 20 mg/dL   Creatinine, Ser 2.11 (H) 0.44 - 1.00 mg/dL   Calcium 8.5 (L) 8.9 - 10.3 mg/dL   Total Protein 6.4 (L) 6.5 - 8.1 g/dL   Albumin 2.9 (L) 3.5 - 5.0 g/dL   AST 64 (H) 15 - 41 U/L   ALT 272 (H) 14 - 54 U/L   Alkaline Phosphatase 156 (H) 38 - 126 U/L   Total Bilirubin 0.9 0.3 - 1.2 mg/dL   GFR calc non Af Amer 21 (L) >60 mL/min   GFR calc Af Amer 24 (L) >60 mL/min    Comment: (NOTE) The eGFR has been calculated using the CKD EPI equation. This calculation has not been validated in all clinical situations. eGFR's persistently <60 mL/min signify possible Chronic Kidney Disease.    Anion gap 16 (H) 5 - 15  Lipase, blood     Status: None   Collection Time: 05/02/17 12:53 PM  Result Value Ref Range   Lipase 24 11 - 51 U/L  CBC WITH DIFFERENTIAL     Status: Abnormal   Collection Time: 05/02/17 12:53 PM  Result Value Ref Range   WBC 32.1 (H) 4.0 - 10.5 K/uL   RBC 4.73 3.87 - 5.11 MIL/uL   Hemoglobin 13.9 12.0 - 15.0 g/dL   HCT 40.4 36.0 - 46.0 %   MCV 85.4 78.0 - 100.0 fL     MCH 29.4 26.0 - 34.0 pg   MCHC 34.4 30.0 - 36.0 g/dL   RDW 15.9 (H) 11.5 - 15.5 %   Platelets 346 150 - 400 K/uL   Neutrophils Relative % 90 %   Lymphocytes Relative 4 %  Monocytes Relative 6 %   Eosinophils Relative 0 %   Basophils Relative 0 %   Neutro Abs 28.9 (H) 1.7 - 7.7 K/uL   Lymphs Abs 1.3 0.7 - 4.0 K/uL   Monocytes Absolute 1.9 (H) 0.1 - 1.0 K/uL   Eosinophils Absolute 0.0 0.0 - 0.7 K/uL   Basophils Absolute 0.0 0.0 - 0.1 K/uL   WBC Morphology TOXIC GRANULATION     Comment: INCREASED BANDS (>20% BANDS)  I-Stat CG4 Lactic Acid, ED     Status: Abnormal   Collection Time: 05/02/17  1:23 PM  Result Value Ref Range   Lactic Acid, Venous 4.14 (HH) 0.5 - 1.9 mmol/L   Comment NOTIFIED PHYSICIAN   Lactic acid, plasma     Status: Abnormal   Collection Time: 05/02/17  5:23 PM  Result Value Ref Range   Lactic Acid, Venous 3.9 (HH) 0.5 - 1.9 mmol/L    Comment: CRITICAL RESULT CALLED TO, READ BACK BY AND VERIFIED WITH: CHELSEA PHELPS RN AT 1025 05/02/17 BY WOOLLENK     Ct Abdomen Pelvis Wo Contrast  Result Date: 05/02/2017 CLINICAL DATA:  83 year old female with abdominal pain and distension. Sepsis. EXAM: CT ABDOMEN AND PELVIS WITHOUT CONTRAST TECHNIQUE: Multidetector CT imaging of the abdomen and pelvis was performed following the standard protocol without IV contrast. Intravenous contrast was not administered secondary to acute kidney injury. COMPARISON:  01/14/2017 CT FINDINGS: Please note that parenchymal abnormalities may be missed without intravenous contrast. Lower chest: Mild to moderate bibasilar atelectasis noted. Hepatobiliary: No hepatic abnormalities identified. The patient is status post cholecystectomy. No biliary dilatation. Pancreas: Unremarkable except for atrophy Spleen: Unremarkable Adrenals/Urinary Tract: Small foci of gas within the bladder wall noted likely representing emphysematous cystitis. The kidneys are unremarkable. Mild fullness of the adrenal glands  again noted. No evidence of hydronephrosis or obstructing urinary calculi. Stomach/Bowel: Mildly distended fluid and gas-filled colon noted with large amount of gas in the distal sigmoid colon and rectum. Pericolonic inflammation of the descending and sigmoid colon noted as well of the rectum. No dilated small bowel loops are noted. No evidence of pneumoperitoneum. The appendix is normal. Vascular/Lymphatic: Aortic atherosclerosis. No enlarged abdominal or pelvic lymph nodes. Reproductive: Uterus and bilateral adnexa are unremarkable. Other: No free fluid or definite focal collection. Musculoskeletal: No acute or suspicious bony abnormality. Lumbar spine surgical changes noted. IMPRESSION: 1. Inflammation adjacent to the descending colon, sigmoid colon and rectum -question colitis. Large amount of stool within the distal sigmoid colon and rectum, with the remainder of the colon mildly distended with gas and fluid. 2. Small foci of gas within the bladder wall likely representing emphysematous cystitis. 3. Mild to moderate bibasilar atelectasis 4.  Aortic Atherosclerosis (ICD10-I70.0). Electronically Signed   By: Margarette Canada M.D.   On: 05/02/2017 18:37   US Renal  Result Date: 05/02/2017 CLINICAL DATA:  Acute kidney injury EXAM: RENAL / URINARY TRACT ULTRASOUND COMPLETE COMPARISON:  01/14/2017 FINDINGS: Right Kidney: Length: 10.0 cm. Normal echogenicity. No hydronephrosis. Small renal cortical cysts in the midpole regions measure 1.5 cm or less in diameter. At least 2 cysts visualized. Left Kidney: Length: 12.4 cm. Echogenicity within normal limits. No mass or hydronephrosis visualized. Bladder: Completely empty.  Limited assess by ultrasound IMPRESSION: Incidental small renal cysts on the right kidney otherwise no acute finding by ultrasound. Negative for hydronephrosis. Electronically Signed   By: Jerilynn Mages.  Shick M.D.   On: 05/02/2017 17:22    Review of Systems  Constitutional: Positive for malaise/fatigue.  Negative for weight  loss.  HENT: Negative for nosebleeds.   Eyes: Negative for blurred vision.  Respiratory: Negative for shortness of breath.   Cardiovascular: Negative for chest pain, palpitations, orthopnea and PND.       Denies DOE  Gastrointestinal: Positive for diarrhea and nausea. Negative for abdominal pain and vomiting.  Genitourinary: Positive for dysuria and frequency. Negative for hematuria.  Musculoskeletal: Negative.   Skin: Negative for itching and rash.  Neurological: Negative for dizziness, focal weakness, seizures, loss of consciousness and headaches.       Denies TIAs, amaurosis fugax  Endo/Heme/Allergies: Does not bruise/bleed easily.  Psychiatric/Behavioral: The patient is not nervous/anxious.    Blood pressure (!) 89/49, pulse 79, temperature 98.5 F (36.9 C), temperature source Rectal, resp. rate (!) 25, height 5' 4"  (1.626 m), weight 81.2 kg (179 lb), SpO2 92 %. Physical Exam  Vitals reviewed. Constitutional: She is oriented to person, place, and time. She appears well-developed and well-nourished. She is cooperative. She appears ill. No distress.  HENT:  Head: Normocephalic and atraumatic.  Right Ear: External ear normal.  Left Ear: External ear normal.  Eyes: Conjunctivae are normal. No scleral icterus.  Neck: Normal range of motion. Neck supple. No tracheal deviation present. No thyromegaly present.  Cardiovascular: Normal rate and normal heart sounds.   Respiratory: Effort normal and breath sounds normal. No stridor. No respiratory distress. She has no wheezes.  GI: Soft. There is no rigidity, no rebound and no guarding. No hernia.    Mildly obese, soft, nondistended, very mild tenderness to palpation; certainly no rebound, guarding or peritonitis  Musculoskeletal: She exhibits no edema or tenderness.  Lymphadenopathy:    She has no cervical adenopathy.  Neurological: She is alert and oriented to person, place, and time. She exhibits normal muscle tone.    Skin: Skin is warm and dry. No rash noted. She is not diaphoretic. No erythema. There is pallor.  Psychiatric: She has a normal mood and affect. Her behavior is normal. Judgment and thought content normal.    Assessment/Plan: Sepsis/urosepsis  Emphysematous cystitis Mild distal sigmoid-upper rectal colitis Diarrhea Acute kidney injury Elevated transaminases Recent spinal surgery History of AV block status post pacemaker  She does not have a surgical abdomen. There is no sign of bowel ischemia. There is no pneumatosis. There is no portal venous gas. The amount of wall inflammation in the sigmoid colon is not that impressive.  Check C. Difficile Recommend urology consult Broad-spectrum IV antibiotics Serial lactates Fluid resuscitation, low threshold for vasopressor Would alert her neurosurgeon tomorrow Believe LFTs are due to shock  Will follow  Leighton Ruff. Redmond Pulling, MD, FACS General, Bariatric, & Minimally Invasive Surgery [facility], [place]    [facility] M 05/02/2017, 7:37 PM

## 2017-05-02 NOTE — ED Notes (Signed)
Pt back from US

## 2017-05-02 NOTE — Consult Note (Signed)
Name: Sharon Gilmore MRN: [id number] DOB: May 31, 1934    ADMISSION DATE:  05/02/2017 CONSULTATION DATE:  05/02/17  REFERRING MD :  Dr. 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   The patient was recently admitted from 9/7-9/11 for L2 to L5 decompression, fusion with posterolateral arthrodesis.  The patient had difficulty with pain control and constipation in the setting of narcotics.  She was discharged to [facility] for rehab efforts.  The patient and family reports she never felt well after discharge.  She reports weakness & fatigue.  In the two days prior to admit, she had decreased appetite, lower abdominal pain, nausea, and pain / "coldness" in her feet.  Initial ER evaluation found her to be hypotensive to 89 systolic.  Initial labs - Na 131, K 3.8, Cl 94, CO2 21, glucose 157, BUN 34 / Sr Cr 2.11, AG 16, Alk Phos 156, albumin 2.9, AST 64, ALT 272, lactic acid 4.14, WBC 32.1, Hgb 13.9 and platelets 346.  UA was positive for WBC TNTC, many bacteria.  She was treated with NSx2L & rocephin with improvement in her blood pressure to 782 systolic.    PCCM consulted for evaluation of severe sepsis.    PAST MEDICAL HISTORY :   has a past medical history of Abnormal barium swallow (09/1999); Arthritis; Asthma; Carpal tunnel syndrome; Depression; Erythema nodosum; H/O Doppler ultrasound; Hypertension; Impaired fasting glucose; Presence of permanent cardiac pacemaker; Psoriasis; Rosacea; Seizures ([place]) (1980); Swallowing disorder; and Systolic murmur.   has a past surgical history that includes Tonsillectomy; Cholecystectomy; Incisional hernia repair; Colonoscopy (2004); Pacemaker Implant (N/A, 03/09/2017); LEFT HEART CATH AND CORONARY ANGIOGRAPHY (N/A, 03/15/2017); and Bilateral carpal tunnel  release.  Prior to Admission medications   Medication Sig Start Date End Date Taking? Authorizing Provider  acetaminophen (TYLENOL) 500 MG tablet Take 1,000 mg by mouth 3 (three) times daily.   Yes [provider]  aspirin EC 81 MG tablet Take 81 mg by mouth at bedtime.    Yes [provider]  atorvastatin (LIPITOR) 20 MG tablet Take 20 mg by mouth at bedtime.    Yes [provider]  Cholecalciferol (VITAMIN D3) 2000 units TABS Take 2,000 Units by mouth daily.    Yes [provider]  cyclobenzaprine (FLEXERIL) 5 MG tablet Take 5 mg by mouth 3 (three) times daily as needed for muscle spasms.   Yes [provider]  enalapril (VASOTEC) 20 MG tablet Take 20 mg by mouth daily.   Yes [provider]  gabapentin (NEURONTIN) 100 MG capsule Take 3 capsules (300 mg total) by mouth at bedtime. 01/04/17  Yes Marcial Pacas, MD  hydrochlorothiazide (HYDRODIURIL) 25 MG tablet Take 1 tablet (25 mg total) by mouth daily. 03/18/17  Yes Roney Jaffe, MD  hydroxypropyl methylcellulose / hypromellose (ISOPTO TEARS / GONIOVISC) 2.5 % ophthalmic solution Place 1 drop into both eyes 4 (four) times daily as needed for dry eyes.   Yes [provider]  metoprolol tartrate (LOPRESSOR) 50 MG tablet Take 1 tablet (50 mg total) by mouth 2 (two) times daily. 03/22/17  Yes Baldwin Jamaica, PA-C  Multiple Vitamins-Minerals (PRESERVISION AREDS 2) CAPS Take 1 capsule by mouth 2 (two) times daily.   Yes [provider]  oxyCODONE (OXY IR/ROXICODONE)  5 MG immediate release tablet Take 5 mg by mouth every 4 (four) hours as needed for severe pain.   Yes [provider]  traMADol (ULTRAM) 50 MG tablet Take 1 tablet (50 mg total) by mouth every 6 (six) hours as needed for moderate pain. Patient taking differently: Take 50 mg by mouth 4 (four) times daily.  04/27/17  Yes Erline Levine, MD  HYDROmorphone (DILAUDID) 2 MG tablet Take 1-2 tablets (2-4 mg total) by  mouth every 4 (four) hours as needed for moderate pain or severe pain. Patient not taking: Reported on 05/02/2017 04/27/17   Erline Levine, MD    Allergies  Allergen Reactions  . Celebrex [Celecoxib] Other (See Comments)    LEG ACHING   . Codeine Nausea And Vomiting  . Cymbalta [Duloxetine Hcl] Other (See Comments)    Sensitive per MAR  . Doxycycline Swelling and Other (See Comments)    Facial swelling  . Meloxicam Other (See Comments)    TUMMY ACHE, INEFFECTIVE  . Nortriptyline Other (See Comments)    unknown  . Percocet [Oxycodone-Acetaminophen] Nausea And Vomiting  . Penicillins Rash and Other (See Comments)    Has patient had a PCN reaction causing immediate rash, facial/tongue/throat swelling, SOB or lightheadedness with hypotension: No Has patient had a PCN reaction causing severe rash involving mucus membranes or skin necrosis: No Has patient had a PCN reaction that required hospitalization No Has patient had a PCN reaction occurring within the last 10 years: No If all of the above answers are "NO", then may proceed with Cephalosporin use.    FAMILY HISTORY:  family history includes Breast cancer in her paternal grandmother; CVA in her maternal grandmother, mother, and paternal grandfather; Coronary artery disease in her brother; Diabetes in her paternal grandmother; Leukemia in her brother; Lung cancer in her paternal uncle; Pancreatic cancer in her brother and paternal aunt; Parkinson's disease in her father; Peripheral vascular disease in her maternal grandfather; Stroke in her maternal grandmother, mother, paternal aunt, and paternal grandfather.  SOCIAL HISTORY:  reports that she quit smoking about 36 years ago. She has never used smokeless tobacco. She reports that she drinks alcohol. She reports that she does not use drugs.  REVIEW OF SYSTEMS:  POSITIVES IN BOLD Constitutional: Negative for fever, chills, weight loss, malaise/fatigue and diaphoresis.  HENT: Negative for  hearing loss, ear pain, nosebleeds, congestion, sore throat, neck pain, tinnitus and ear discharge.   Eyes: Negative for blurred vision, double vision, photophobia, pain, discharge and redness.  Respiratory: Negative for cough, hemoptysis, sputum production, shortness of breath, wheezing and stridor.   Cardiovascular: Negative for chest pain, palpitations, orthopnea, claudication, leg swelling and PND.  Gastrointestinal: Negative for heartburn, nausea, vomiting, lower abdominal pain, diarrhea, constipation, blood in stool and melena.  Genitourinary: Negative for dysuria, urgency, frequency, hematuria and flank pain.  Musculoskeletal: Negative for myalgias, back pain, joint pain and falls.  Skin: Negative for itching and rash.  Neurological: Negative for dizziness, tingling, tremors, sensory change, speech change, focal weakness, seizures, loss of consciousness, weakness and headaches.  Endo/Heme/Allergies: Negative for environmental allergies and polydipsia. Does not bruise/bleed easily.  SUBJECTIVE:   VITAL SIGNS: Temp:  [98.5 F (36.9 C)] 98.5 F (36.9 C) (09/16 1143) Pulse Rate:  [73-83] 83 (09/16 1415) Resp:  [21-29] 24 (09/16 1415) BP: (89-106)/(47-63) 106/49 (09/16 1415) SpO2:  [90 %-99 %] 99 % (09/16 1415) Weight:  [179 lb (81.2 kg)] 179 lb (81.2 kg) (09/16 1123)  PHYSICAL EXAMINATION: General:  Well developed elderly  female in NAD lying in bed HEENT: MM pink/dry, no jvd PSY: calm/appropriate  Neuro: AAOx4, speech clear, MAE  CV: s1s2 rrr, 3/6 murmur noted 2nd ICS RSB & LSB  PULM: even/non-labored, lungs bilaterally clear  KP:QAES, non-tender, bsx4 active  Extremities: warm/dry, no edema, LE mottling noted Skin: no rashes or lesions    Recent Labs Lab 05/02/17 1253  NA 131*  K 3.8  CL 94*  CO2 21*  BUN 34*  CREATININE 2.11*  GLUCOSE 157*     Recent Labs Lab 05/02/17 1253  HGB 13.9  HCT 40.4  WBC 32.1*  PLT 346    No results found.    SIGNIFICANT  EVENTS  9/16  Admit with severe sepsis secondary to UTI   STUDIES:  Renal US 9/16 >>   ANTIBIOTICS Rocephin 2g 9/16 >>   CULTURES BCx2 9/16 >>  UA 9/16 >> positive UTI  UC 9/16 >>   ASSESSMENT / PLAN:  Discussion: 83 y/o F admitted with severe sepsis secondary to UTI.  Recently admitted for L2-L5 decompression / fusion.  Responding to IVF's.    Severe Sepsis secondary to UTI Plan:  Hold home antihypertensives (vasotec, HCTZ, metoprolol) for 9/16.  Consider restart 9/17 SDU admission per [facility]  Continue NS @ 152m/hr  Follow lactic acid  Assess renal [place]oreato ensure no obstruction / hydronephrosis  Rocephin as above  Follow urine / blood cultures, narrow abx as able    AKI - secondary to sepsis  Plan: Trend BMP / urinary output Replace electrolytes as indicated Avoid nephrotoxic agents, ensure adequate renal perfusion   Nausea - without vomiting, resolved Plan: Clear liquid diet as tolerated    HTN Plan: Per primary  Hold meds as above    Recent Decompression / Fusion Plan: PT efforts once stabilized from UTI Minimize sedating medications as able given hypotension  BNoe Gens NP-C [facility] Pgr: [phone] or if no answer [phone]/16/2018, 2:56 PM

## 2017-05-02 NOTE — H&P (Signed)
Family Medicine Teaching [facility] Admission History and Physical Service Pager: [phone]  Patient name: Sharon Gilmore Medical record number: [id number] Date of birth: 1934/01/27 Age: 83 y.o. Gender: female  Primary Care Provider: Olivia Canter, MD Consultants: critical care Code Status: FULL  Chief Complaint: abdominal pain, hypotension  Assessment and Plan: DANEL STUDZINSKI is a 83 y.o. female presenting with hypotension, abdominal pain, UA suggestive of urosepsis. PMH is significant for HTN, lumbar stenosis with recent decompression and fusion 04/2017, AV block s/p pacemaker 02/2017, asthma.   Urosepsis: hypotensive at SNF, BP 118/70 after 500cc bolus in EMS truck. Lactic acid 4.14, WBC 32. UA concerning for urosepsis (cloudy, leuks, TNTC WBC, many bacteria, 0-5 squams) in the setting of recent surgery with foley placement and patient with urinary frequency. S/p 30cck/kg normal saline bolus, BP responsive to this. Based on UA, leading differential is urosepsis, however patient with abdominal pain out of proportion to exam. Will get CT abd without contrast given AKI. Additional possible etiologies include bowel obstruction or perforation, urinary retention, mesenteric ischemia 2/2 hypoperfusion. Consulted surgery, Dr. Andrey Campanile to see after CT results. D/w lab, they will run urine culture from specimen prior to antibiotics.  -admit to stepdown, Dr. Gwendolyn Grant attending -cardiac monitoring -vitals q4 hours -trend lactic acid -continue rocephin -appreciate critical care consult and recs: hold antihypertensives, continue rocephin -CT wo contrast STAT -renal US  -APTT and INR -blood and urine cultures -consult general surgery 2/2 concern for intraabdominal process  -EKG  Elevated transaminases: AST 222 and ALT 64, last checked and WNL 03/13/2017. Elevated LFTs likely 2/2 shock liver with hypotension and lactic acidosis.  -CMP in am  AKI: baseline Cr. 0.5. Cr on admit 2.11 in the  setting of hypovolemia 2/2 sepsis (dry mucuous membranes on exam). Cause of AKI likely shock 2/2 sepsis vs urinary retention. S/p 30cc/kg bolus in ED.  -renal US -strict I/Os -monitor fluid status  Hx of spinal surgery: recent spinal surgery with Dr. Venetia Maxon (L2-L5 decompression and fusion) on 04/23/17. Prior to this, lived in independent apartment at [place]. D/c to SNF for rehab. No signs of infection or bleeding over site, patient denies back pain. -monitor on exam  HTN: home meds are enalapril, HCTZ, lopressor.  -hold home meds in the setting of sepsis and hypotension  Hx of high degree AV block, s/p pacemaker: pacer placed 03/08/17. Will observe blood cultures, if positive, may need to consider echo to rule out vegetations on leads.  -monitor blood cultures -monitor on tele  Asthma: patient reports no inhalers for several years, also denies URIs or hx of pneumonia. -monitor O2 sats on vitals  HLD: home med is lipitor . Hold in the setting of elevated transaminases.  -hold home lipitor  FEN/GI: NPO pending CT results Prophylaxis: SCDs  Disposition: admit to stepdown  History of Present Illness:  Sharon Gilmore is a 83 y.o. female presenting with hypotension from SNF as well as abdominal pain and fullness. Patient is a retired [employer] fundraiser, and 3 sons also provide history. She had lumbar surgery on 9/7 and was doing well on discharge except for some minor cramping in bilateral hamstrings. Prior to lumbar surgery, she lived in an independent apartment at [place]. She drove herself until about 2 months ago, manages her own finances. Had to start using a walker about 1 month ago due to gait instability thought to be 2/2 peripheral neuropathy with lumbar stenosis. On Friday, the patient noted some abdominal fullness and decreased appetite. Sons report she was  given a suppository and had a BM at that time. Over the weekend, they noticed her getting more lethargic, tired, and having less  appetite. She endorses urinary frequency starting Friday as well. She reports that her temps were low when checked by SNF staff, denies fever. Reports some nausea, denies emesis. Sons report she had an episode of diarrhea today in the ED. Today, she was sent via EMS 2/2 hypotension to 80s/30s. S/p 500cc bolus with repeat BP 118/70 in EMS truck. Of note, son Sharon Gilmore is present today and reports he is her HCPOA and that she does have a living will.   In the ED, hypotensive to 89/47, tachypneic to 29, afebrile. Lactic acid 4.14. UA c/w infection as above.   Review Of Systems: Per HPI with the following additions:   Review of Systems  Constitutional: Positive for malaise/fatigue.  HENT: Negative for congestion and sore throat.   Eyes: Negative for blurred vision.  Respiratory: Negative for cough, shortness of breath and wheezing.   Cardiovascular: Negative for chest pain, palpitations, orthopnea and leg swelling.  Gastrointestinal: Positive for abdominal pain and nausea. Negative for heartburn and vomiting.  Genitourinary: Positive for frequency. Negative for dysuria.  Musculoskeletal: Positive for myalgias. Negative for back pain and falls.  Skin: Negative for rash.  Neurological: Negative for weakness.    Patient Active Problem List   Diagnosis Date Noted  . Lumbar spinal stenosis 04/23/2017  . DOE (dyspnea on exertion) 03/08/2017  . AV block, 2nd degree 03/08/2017  . Essential hypertension 03/08/2017  . Family history of coronary artery disease in brother 03/08/2017  . History of asthma 03/08/2017  . AVB (atrioventricular block) 03/08/2017  . Abdominal pain 01/04/2017  . Spinal stenosis 01/04/2017    Past Medical History: Past Medical History:  Diagnosis Date  . Abnormal barium swallow 09/1999  . Arthritis   . Asthma   . Carpal tunnel syndrome   . Depression   . Erythema nodosum   . H/O Doppler ultrasound    LEFT LEG 11/1997  . Hypertension   . Impaired fasting glucose   .  Presence of permanent cardiac pacemaker    MEDTRONIC  . Psoriasis   . Rosacea   . Seizures (HCC) 1980  . Swallowing disorder   . Systolic murmur     Past Surgical History: Past Surgical History:  Procedure Laterality Date  . BILATERAL CARPAL TUNNEL RELEASE    . CHOLECYSTECTOMY    . COLONOSCOPY  2004  . INCISIONAL HERNIA REPAIR    . LEFT HEART CATH AND CORONARY ANGIOGRAPHY N/A 03/15/2017   Procedure: Left Heart Cath and Coronary Angiography;  Surgeon: Runell Gess, MD;  Location: [facility] INVASIVE CV LAB;  Service: Cardiovascular;  Laterality: N/A;  . PACEMAKER IMPLANT N/A 03/09/2017   Procedure: Pacemaker Implant;  Surgeon: Hillis Range, MD;  Location: [facility] INVASIVE CV LAB;  Service: Cardiovascular;  Laterality: N/A;  . TONSILLECTOMY      Social History: Social History  Substance Use Topics  . Smoking status: Former Smoker    Quit date: 1982  . Smokeless tobacco: Never Used  . Alcohol use Yes     Comment: 1-2 times per week   Additional social history: lives in independent apartment as above.  Please also refer to relevant sections of EMR.  Family History: Family History  Problem Relation Age of Onset  . CVA Mother   . Stroke Mother   . Parkinson's disease Father   . Leukemia Brother   . CVA Maternal Grandmother   .  Stroke Maternal Grandmother   . Peripheral vascular disease Maternal Grandfather   . Breast cancer Paternal Grandmother   . Diabetes Paternal Grandmother   . CVA Paternal Grandfather   . Stroke Paternal Grandfather   . Pancreatic cancer Paternal Aunt   . Stroke Paternal Aunt   . Lung cancer Paternal Uncle   . Pancreatic cancer Brother   . Coronary artery disease Brother     Allergies and Medications: Allergies  Allergen Reactions  . Celebrex [Celecoxib] Other (See Comments)    LEG ACHING   . Codeine Nausea And Vomiting  . Cymbalta [Duloxetine Hcl] Other (See Comments)    Sensitive per MAR  . Doxycycline Swelling and Other (See Comments)     Facial swelling  . Meloxicam Other (See Comments)    TUMMY ACHE, INEFFECTIVE  . Nortriptyline Other (See Comments)    unknown  . Percocet [Oxycodone-Acetaminophen] Nausea And Vomiting  . Penicillins Rash and Other (See Comments)    Has patient had a PCN reaction causing immediate rash, facial/tongue/throat swelling, SOB or lightheadedness with hypotension: No Has patient had a PCN reaction causing severe rash involving mucus membranes or skin necrosis: No Has patient had a PCN reaction that required hospitalization No Has patient had a PCN reaction occurring within the last 10 years: No If all of the above answers are "NO", then may proceed with Cephalosporin use.   No current facility-administered medications on file prior to encounter.    Current Outpatient Prescriptions on File Prior to Encounter  Medication Sig Dispense Refill  . aspirin EC 81 MG tablet Take 81 mg by mouth at bedtime.     Marland Kitchen atorvastatin (LIPITOR) 20 MG tablet Take 20 mg by mouth at bedtime.     . Cholecalciferol (VITAMIN D3) 2000 units TABS Take 2,000 Units by mouth daily.     . enalapril (VASOTEC) 20 MG tablet Take 20 mg by mouth daily.    Marland Kitchen gabapentin (NEURONTIN) 100 MG capsule Take 3 capsules (300 mg total) by mouth at bedtime. 90 capsule 6  . hydrochlorothiazide (HYDRODIURIL) 25 MG tablet Take 1 tablet (25 mg total) by mouth daily. 60 tablet 2  . hydroxypropyl methylcellulose / hypromellose (ISOPTO TEARS / GONIOVISC) 2.5 % ophthalmic solution Place 1 drop into both eyes 4 (four) times daily as needed for dry eyes.    . metoprolol tartrate (LOPRESSOR) 50 MG tablet Take 1 tablet (50 mg total) by mouth 2 (two) times daily. 60 tablet 6  . Multiple Vitamins-Minerals (PRESERVISION AREDS 2) CAPS Take 1 capsule by mouth 2 (two) times daily.    . traMADol (ULTRAM) 50 MG tablet Take 1 tablet (50 mg total) by mouth every 6 (six) hours as needed for moderate pain. (Patient taking differently: Take 50 mg by mouth 4 (four) times  daily. ) 60 tablet 0  . HYDROmorphone (DILAUDID) 2 MG tablet Take 1-2 tablets (2-4 mg total) by mouth every 4 (four) hours as needed for moderate pain or severe pain. (Patient not taking: Reported on 05/02/2017) 90 tablet 0    Objective: BP (!) 112/51   Pulse 88   Temp 98.5 F (36.9 C) (Rectal)   Resp (!) 24   Ht  (1.626 m)   Wt 179 lb (81.2 kg)   SpO2 100%   BMI 30.73 kg/m  Exam: General: sleepy but arousable, ill appearing elderly female lying in bed Eyes: PEERLA, EOMI ENTM: dry MM, atraumatic, fair dentition Neck: supple, no JVD Cardiovascular: RRR, no murmur Respiratory: CTAB, exam limited  as patient unable to turn, easy WOB, no crackles or wheezing Gastrointestinal: abdomen distended, soft, tender diffusely to light palpation, tender to any movement, +BS  MSK: dressing in place over lumbar spine without tenderness or drainage, back nontender to palpation Derm: no rashes or sores, dressing over surgical site as above C/D/I Neuro: strength and sensation symmetric, CN II-XII grossly intact Psych: mood and affect appropriate  Labs and Imaging: CBC BMET   Recent Labs Lab 05/02/17 1253  WBC 32.1*  HGB 13.9  HCT 40.4  PLT 346    Recent Labs Lab 05/02/17 1253  NA 131*  K 3.8  CL 94*  CO2 21*  BUN 34*  CREATININE 2.11*  GLUCOSE 157*  CALCIUM 8.5*     Lactic acid 4.14 Lipase 24  Garth Bigness, MD 05/02/2017, 3:29 PM PGY-2, [facility] FPTS Intern pager: [phone], text pages welcome

## 2017-05-02 NOTE — ED Provider Notes (Signed)
[facility]-EMERGENCY DEPT Provider Note  CSN: [id number] Arrival date & time: 05/02/17  1050   History   Chief Complaint Abdominal pain  HPI Sharon Gilmore is a 83 y.o. female.  HPI Patient presents to the emergency room for evaluation of abdominal pain.  Patient was admitted to the hospital earlier this month for elective spinal surgery. Patient was discharged on September 11.She tolerated her surgery well without any complications. Patient was transferred to a nursing facility to rehabilitation. Patient states yesterday she started having abdominal pain. She is noted some discomfort in her lower abdomen associated with a sensation of needing to urinate. She feels like she is not emptying her bladder properly. She has been nauseated but has not vomited. She denies any constipation and feels like she had a good bowel movement after some laxative use yesterday. Her symptoms were worse today so she was brought to the emergency room. EMS noted that her blood pressure was low initially and her skin color looked mottled. Past Medical History:  Diagnosis Date  . Abnormal barium swallow 09/1999  . Arthritis   . Asthma   . Carpal tunnel syndrome   . Depression   . Erythema nodosum   . H/O Doppler ultrasound    LEFT LEG 11/1997  . Hypertension   . Impaired fasting glucose   . Presence of permanent cardiac pacemaker    MEDTRONIC  . Psoriasis   . Rosacea   . Seizures (HCC) 1980  . Swallowing disorder   . Systolic murmur     Patient Active Problem List   Diagnosis Date Noted  . Lumbar spinal stenosis 04/23/2017  . DOE (dyspnea on exertion) 03/08/2017  . AV block, 2nd degree 03/08/2017  . Essential hypertension 03/08/2017  . Family history of coronary artery disease in brother 03/08/2017  . History of asthma 03/08/2017  . AVB (atrioventricular block) 03/08/2017  . Abdominal pain 01/04/2017  . Spinal stenosis 01/04/2017    Past Surgical History:  Procedure Laterality Date  .  BILATERAL CARPAL TUNNEL RELEASE    . CHOLECYSTECTOMY    . COLONOSCOPY  2004  . INCISIONAL HERNIA REPAIR    . LEFT HEART CATH AND CORONARY ANGIOGRAPHY N/A 03/15/2017   Procedure: Left Heart Cath and Coronary Angiography;  Surgeon: Runell Gess, MD;  Location: [facility] INVASIVE CV LAB;  Service: Cardiovascular;  Laterality: N/A;  . PACEMAKER IMPLANT N/A 03/09/2017   Procedure: Pacemaker Implant;  Surgeon: Hillis Range, MD;  Location: [facility] INVASIVE CV LAB;  Service: Cardiovascular;  Laterality: N/A;  . TONSILLECTOMY      OB History    No data available       Home Medications    Prior to Admission medications   Medication Sig Start Date End Date Taking? Authorizing Provider  aspirin EC 81 MG tablet Take 81 mg by mouth at bedtime.     [provider]  atorvastatin (LIPITOR) 20 MG tablet Take 20 mg by mouth at bedtime.     [provider]  Cholecalciferol (VITAMIN D3) 2000 units TABS Take 2,000 Units by mouth daily.     [provider]  enalapril (VASOTEC) 20 MG tablet Take 20 mg by mouth daily.    [provider]  gabapentin (NEURONTIN) 100 MG capsule Take 3 capsules (300 mg total) by mouth at bedtime. 01/04/17   Levert Feinstein, MD  hydrochlorothiazide (HYDRODIURIL) 25 MG tablet Take 1 tablet (25 mg total) by mouth daily. 03/18/17   Delano Metz, MD  HYDROmorphone (DILAUDID) 2 MG  tablet Take 1-2 tablets (2-4 mg total) by mouth every 4 (four) hours as needed for moderate pain or severe pain. 04/27/17   Maeola Harman, MD  hydroxypropyl methylcellulose / hypromellose (ISOPTO TEARS / GONIOVISC) 2.5 % ophthalmic solution Place 1 drop into both eyes 4 (four) times daily as needed for dry eyes.    [provider]  metoprolol tartrate (LOPRESSOR) 50 MG tablet Take 1 tablet (50 mg total) by mouth 2 (two) times daily. 03/22/17   Sheilah Pigeon, PA-C  Multiple Vitamins-Minerals (PRESERVISION AREDS 2) CAPS Take 1 capsule by mouth 2 (two) times daily.    [provider]  traMADol (ULTRAM) 50 MG tablet Take 50 mg by mouth every 6 (six) hours as needed for moderate pain.    [provider]  traMADol (ULTRAM) 50 MG tablet Take 1 tablet (50 mg total) by mouth every 6 (six) hours as needed for moderate pain. 04/27/17   Maeola Harman, MD    Family History Family History  Problem Relation Age of Onset  . CVA Mother   . Stroke Mother   . Parkinson's disease Father   . Leukemia Brother   . CVA Maternal Grandmother   . Stroke Maternal Grandmother   . Peripheral vascular disease Maternal Grandfather   . Breast cancer Paternal Grandmother   . Diabetes Paternal Grandmother   . CVA Paternal Grandfather   . Stroke Paternal Grandfather   . Pancreatic cancer Paternal Aunt   . Stroke Paternal Aunt   . Lung cancer Paternal Uncle   . Pancreatic cancer Brother   . Coronary artery disease Brother     Social History Social History  Substance Use Topics  . Smoking status: Former Smoker    Quit date: 1982  . Smokeless tobacco: Never Used  . Alcohol use Yes     Comment: 1-2 times per week     Allergies   Celebrex [celecoxib]; Codeine; Doxycycline; Meloxicam; Nortriptyline; Percocet [oxycodone-acetaminophen]; and Penicillins   Review of Systems Review of Systems  All other systems reviewed and are negative.    Physical Exam Updated Vital Signs There were no vitals taken for this visit.  Physical Exam  Constitutional: No distress.  Frail, elderly  HENT:  Head: Normocephalic and atraumatic.  Right Ear: External ear normal.  Left Ear: External ear normal.  Eyes: Conjunctivae are normal. Right eye exhibits no discharge. Left eye exhibits no discharge. No scleral icterus.  Neck: Neck supple. No tracheal deviation present.  Cardiovascular: Normal rate, regular rhythm and intact distal pulses.   Pulmonary/Chest: Effort normal and breath sounds normal. No stridor. No respiratory distress. She has no wheezes. She has no rales.    Abdominal: Soft. Bowel sounds are normal. She exhibits no distension. There is no hepatosplenomegaly. There is tenderness in the suprapubic area. There is no rebound and no guarding. No hernia.  Musculoskeletal: She exhibits no edema or tenderness.  No cyanosisin her peripheral extremities  Neurological: She is alert. She has normal strength. No cranial nerve deficit (no facial droop, extraocular movements intact, no slurred speech) or sensory deficit. She exhibits normal muscle tone. She displays no seizure activity. Coordination normal.  Skin: Skin is warm and dry. No rash noted. There is pallor.  Psychiatric: She has a normal mood and affect.  Nursing note and vitals reviewed.    ED Treatments / Results  Labs (all labs ordered are listed, but only abnormal results are displayed) Labs Reviewed  COMPREHENSIVE METABOLIC PANEL - Abnormal; Notable for the following:  Result Value   Sodium 131 (*)    Chloride 94 (*)    CO2 21 (*)    Glucose, Bld 157 (*)    BUN 34 (*)    Creatinine, Ser 2.11 (*)    Calcium 8.5 (*)    Total Protein 6.4 (*)    Albumin 2.9 (*)    AST 64 (*)    ALT 272 (*)    Alkaline Phosphatase 156 (*)    GFR calc non Af Amer 21 (*)    GFR calc Af Amer 24 (*)    Anion gap 16 (*)    All other components within normal limits  CBC WITH DIFFERENTIAL/PLATELET - Abnormal; Notable for the following:    WBC 32.1 (*)    RDW 15.9 (*)    Neutro Abs 28.9 (*)    Monocytes Absolute 1.9 (*)    All other components within normal limits  URINALYSIS, ROUTINE W REFLEX MICROSCOPIC - Abnormal; Notable for the following:    Color, Urine AMBER (*)    APPearance CLOUDY (*)    Bilirubin Urine SMALL (*)    Protein, ur 30 (*)    Leukocytes, UA MODERATE (*)    Bacteria, UA MANY (*)    Squamous Epithelial / LPF 0-5 (*)    All other components within normal limits  LACTIC ACID, PLASMA - Abnormal; Notable for the following:    Lactic Acid, Venous 3.9 (*)    All other components  within normal limits  LACTIC ACID, PLASMA - Abnormal; Notable for the following:    Lactic Acid, Venous 3.3 (*)    All other components within normal limits  PROTIME-INR - Abnormal; Notable for the following:    Prothrombin Time 18.2 (*)    All other components within normal limits  COMPREHENSIVE METABOLIC PANEL - Abnormal; Notable for the following:    Sodium 134 (*)    CO2 19 (*)    Glucose, Bld 109 (*)    BUN 33 (*)    Creatinine, Ser 1.08 (*)    Calcium 7.5 (*)    Total Protein 5.6 (*)    Albumin 2.1 (*)    AST 55 (*)    ALT 165 (*)    GFR calc non Af Amer 47 (*)    GFR calc Af Amer 55 (*)    All other components within normal limits  CBC - Abnormal; Notable for the following:    WBC 22.0 (*)    RDW 16.7 (*)    All other components within normal limits  GLUCOSE, CAPILLARY - Abnormal; Notable for the following:    Glucose-Capillary 115 (*)    All other components within normal limits  GLUCOSE, CAPILLARY - Abnormal; Notable for the following:    Glucose-Capillary 114 (*)    All other components within normal limits  I-STAT CG4 LACTIC ACID, ED - Abnormal; Notable for the following:    Lactic Acid, Venous 4.14 (*)    All other components within normal limits  CULTURE, BLOOD (ROUTINE X 2)  CULTURE, BLOOD (ROUTINE X 2)  URINE CULTURE  LIPASE, BLOOD  APTT     Procedures .Critical Care Performed by: Linwood Dibbles Authorized by: Linwood Dibbles   Critical care provider statement:    Critical care time (minutes):  45   Critical care was necessary to treat or prevent imminent or life-threatening deterioration of the following conditions:  Sepsis   Critical care was time spent personally by me on the following activities:  Discussions  with consultants, evaluation of patient's response to treatment, examination of patient, ordering and performing treatments and interventions, ordering and review of laboratory studies, ordering and review of radiographic studies, pulse oximetry,  re-evaluation of patient's condition, obtaining history from patient or surrogate and review of old charts   (including critical care time)  Medications Ordered in ED Medications  ondansetron (ZOFRAN) injection 4 mg (not administered)  fentaNYL (SUBLIMAZE) injection 50 mcg (not administered)  0.9 %  sodium chloride infusion (not administered)     Initial Impression / Assessment and Plan / ED Course  I have reviewed the triage vital signs and the nursing notes.  Pertinent labs & imaging results that were available during my care of the patient were reviewed by me and considered in my medical decision making (see chart for details).  Clinical Course as of May 03 857  Wynelle Link May 02, 2017  1239 UA is consistent with a UTI.  Will start on abx.   Labs still pending  [JK]  1248 Fluids had not been started yet.  Discussed with RN Edward Qualia who just assumed care.  Will work on fluid infusion, abx now.  [JK]  1340 Lactic acid elevated.  Sx concerning for sepsis.  CMET and lipase are pending.  IV fluids infusin, as well as abx.  Continue to monitor.  [JK]  1408 Discussed with critical care.  Will continue with fluid infusion.  They will consult regarding admission.  [JK]  1457 Bp is improving.  Pt seen by PCCM.  Feel that patient is appropriate for stepdown as BP she appears to be improving.  I will consult the medical service  [JK]    Clinical Course User Index [JK] Linwood Dibbles, MD    Pt presented to the ED after recent elective spinal surgery.  Yesterday started with abd pain and vomiting.  Hypotensive in the ED, concerning for sepsis.  Labs notable for lactic acidosis, leukocytosis, AKI, elevated lfts, and UTI.  Suspect urosepsis as the etiology.  Critical care consulted.  Pt did improve with iv hydration in the ED.  Admitted to the medical service for continued care.  Final Clinical Impressions(s) / ED Diagnoses   Final diagnoses:  Sepsis, due to unspecified organism [facility])  Urinary tract  infection without hematuria, site unspecified       Linwood Dibbles, MD 05/03/17 [phone]

## 2017-05-02 NOTE — Progress Notes (Signed)
eLink Physician-Brief Progress Note Patient Name: Sharon Gilmore DOB: [birth year] MRN: [id number]   Date of Service  05/02/2017  HPI/Events of Note  Contacted by RN. Patient cannot "transfer" from ED to ICU.  eICU Interventions  Order placed for ICU admission.      Intervention Category Major Interventions: Other:  Lawanda Cousins 05/02/2017, 11:44 PM

## 2017-05-02 NOTE — Progress Notes (Signed)
eLink Physician-Brief Progress Note Patient Name: Sharon Gilmore DOB: 1933-10-09 MRN: [id number]   Date of Service  05/02/2017  HPI/Events of Note  D/w FPTS resident Remains hypotensive CT abd >> colitis + air in bladder, they d/w urology  eICU Interventions  Transfer to ICU Start neo gtt for MAP 65 goal     Intervention Category Intermediate Interventions: Diagnostic test evaluation  Rabon Scholle V. 05/02/2017, 7:57 PM

## 2017-05-02 NOTE — ED Notes (Signed)
Dr aware  Of latic acid

## 2017-05-02 NOTE — ED Triage Notes (Signed)
PT snt from [place] burn with reported ABD pain and weakness. Pt was reported to be hypo tensive with BP  80/36.  Marland Kitchen Ems reported Bo  80/36 . 500 NS vis IV site given by EMS. BP 118/70 after bolus.

## 2017-05-02 NOTE — ED Notes (Signed)
Surgery at bedside.

## 2017-05-02 NOTE — Progress Notes (Addendum)
FPTS Interim Progress Note  S: Checking on patient again due to concerning abdominal exam. Patient reports pain unchanged from previous. Still great pain with movement. BP low again despite additional 1L NS (now s/p 4L NS).   O: BP (!) 89/49   Pulse 79   Temp 98.5 F (36.9 C) (Rectal)   Resp (!) 25   Ht  (1.626 m)   Wt 179 lb (81.2 kg)   SpO2 92%   BMI 30.73 kg/m   General: sleepy but arousable, ill appearing elderly female lying in bed Neck: supple, no JVD Cardiovascular: RRR, no murmur Respiratory: CTAB, exam limited as patient unable to turn, easy WOB, no crackles or wheezing Gastrointestinal: abdomen distended, soft, tender diffusely to light palpation (most tender in LLQ), tender with any movement, +BS, no rebound or rigidity Neuro: strength and sensation symmetric, CN II-XII grossly intact Psych: mood and affect appropriate  A/P: Abd pain and distention: will discuss again with surgery. CT results with colitis, emphysematous cystitis. Patient with now 2 episodes of diarrhea.  -discussed with urology re emphysematous cystitis: recommended foley for maximal drainage, no surgical role in this  -surgery to see -C diff stool study  Sepsis: repeat lactate after copious IVF now 3.9. MAP 69 when I was examining her. Concerned with minimal improvement despite IVF and abx.  -continue to trend lactic acid -will discuss again with CCM as BP is not holding with copious hydration and antibiotics  Garth Bigness, MD 05/02/2017, 7:43 PM PGY-2, [facility] pager [phone]

## 2017-05-02 NOTE — ED Notes (Signed)
Patient transported to CT 

## 2017-05-03 ENCOUNTER — Encounter (HOSPITAL_COMMUNITY): Payer: Self-pay | Admitting: *Deleted

## 2017-05-03 ENCOUNTER — Inpatient Hospital Stay (HOSPITAL_COMMUNITY): Payer: Medicare Other

## 2017-05-03 DIAGNOSIS — N179 Acute kidney failure, unspecified: Secondary | ICD-10-CM

## 2017-05-03 DIAGNOSIS — R6521 Severe sepsis with septic shock: Secondary | ICD-10-CM

## 2017-05-03 DIAGNOSIS — N390 Urinary tract infection, site not specified: Secondary | ICD-10-CM

## 2017-05-03 DIAGNOSIS — R1010 Upper abdominal pain, unspecified: Secondary | ICD-10-CM

## 2017-05-03 LAB — COMPREHENSIVE METABOLIC PANEL WITH GFR
ALT: 165 U/L — ABNORMAL HIGH (ref 14–54)
AST: 55 U/L — ABNORMAL HIGH (ref 15–41)
Albumin: 2.1 g/dL — ABNORMAL LOW (ref 3.5–5.0)
Alkaline Phosphatase: 114 U/L (ref 38–126)
Anion gap: 10 (ref 5–15)
BUN: 33 mg/dL — ABNORMAL HIGH (ref 6–20)
CO2: 19 mmol/L — ABNORMAL LOW (ref 22–32)
Calcium: 7.5 mg/dL — ABNORMAL LOW (ref 8.9–10.3)
Chloride: 105 mmol/L (ref 101–111)
Creatinine, Ser: 1.08 mg/dL — ABNORMAL HIGH (ref 0.44–1.00)
GFR calc Af Amer: 55 mL/min — ABNORMAL LOW
GFR calc non Af Amer: 47 mL/min — ABNORMAL LOW
Glucose, Bld: 109 mg/dL — ABNORMAL HIGH (ref 65–99)
Potassium: 3.9 mmol/L (ref 3.5–5.1)
Sodium: 134 mmol/L — ABNORMAL LOW (ref 135–145)
Total Bilirubin: 0.9 mg/dL (ref 0.3–1.2)
Total Protein: 5.6 g/dL — ABNORMAL LOW (ref 6.5–8.1)

## 2017-05-03 LAB — GLUCOSE, CAPILLARY
Glucose-Capillary: 115 mg/dL — ABNORMAL HIGH (ref 65–99)
Glucose-Capillary: 114 mg/dL — ABNORMAL HIGH (ref 65–99)
Glucose-Capillary: 128 mg/dL — ABNORMAL HIGH (ref 65–99)
Glucose-Capillary: 125 mg/dL — ABNORMAL HIGH (ref 65–99)

## 2017-05-03 LAB — CBC WITH DIFFERENTIAL/PLATELET
BASOS ABS: 0 10*3/uL (ref 0.0–0.1)
Basophils Relative: 0 %
EOS PCT: 0 %
Eosinophils Absolute: 0 10*3/uL (ref 0.0–0.7)
HEMATOCRIT: 33.6 % — AB (ref 36.0–46.0)
Hemoglobin: 11.2 g/dL — ABNORMAL LOW (ref 12.0–15.0)
Lymphocytes Relative: 3 %
Lymphs Abs: 0.4 10*3/uL — ABNORMAL LOW (ref 0.7–4.0)
MCH: 28.6 pg (ref 26.0–34.0)
MCHC: 33.3 g/dL (ref 30.0–36.0)
MCV: 85.9 fL (ref 78.0–100.0)
MONO ABS: 0.3 10*3/uL (ref 0.1–1.0)
MONOS PCT: 2 %
Neutro Abs: 13.5 10*3/uL — ABNORMAL HIGH (ref 1.7–7.7)
Neutrophils Relative %: 95 %
PLATELETS: 249 10*3/uL (ref 150–400)
RBC: 3.91 MIL/uL (ref 3.87–5.11)
RDW: 16.7 % — ABNORMAL HIGH (ref 11.5–15.5)
WBC Morphology: INCREASED
WBC: 14.2 10*3/uL — AB (ref 4.0–10.5)

## 2017-05-03 LAB — CBC
HEMATOCRIT: 36.5 % (ref 36.0–46.0)
Hemoglobin: 12.5 g/dL (ref 12.0–15.0)
MCH: 29.6 pg (ref 26.0–34.0)
MCHC: 34.2 g/dL (ref 30.0–36.0)
MCV: 86.5 fL (ref 78.0–100.0)
Platelets: 269 10*3/uL (ref 150–400)
RBC: 4.22 MIL/uL (ref 3.87–5.11)
RDW: 16.7 % — AB (ref 11.5–15.5)
WBC: 22 10*3/uL — AB (ref 4.0–10.5)

## 2017-05-03 LAB — LIPASE, BLOOD: LIPASE: 20 U/L (ref 11–51)

## 2017-05-03 LAB — AMYLASE: Amylase: 410 U/L — ABNORMAL HIGH (ref 28–100)

## 2017-05-03 MED ORDER — METOPROLOL TARTRATE 5 MG/5ML IV SOLN
2.5000 mg | INTRAVENOUS | Status: DC | PRN
Start: 2017-05-03 — End: 2017-05-05
  Administered 2017-05-03: 2.5 mg via INTRAVENOUS
  Administered 2017-05-04 – 2017-05-05 (×3): 5 mg via INTRAVENOUS
  Filled 2017-05-03 (×5): qty 5

## 2017-05-03 MED ORDER — CHLORHEXIDINE GLUCONATE 0.12 % MT SOLN
15.0000 mL | Freq: Two times a day (BID) | OROMUCOSAL | Status: DC
  Administered 2017-05-03 – 2017-05-05 (×7): 15 mL via OROMUCOSAL
  Filled 2017-05-03: qty 15

## 2017-05-03 MED ORDER — HEPARIN SODIUM (PORCINE) 5000 UNIT/ML IJ SOLN
5000.0000 [IU] | Freq: Three times a day (TID) | INTRAMUSCULAR | Status: DC
  Administered 2017-05-03 – 2017-05-10 (×21): 5000 [IU] via SUBCUTANEOUS
  Filled 2017-05-03 (×20): qty 1

## 2017-05-03 MED ORDER — FENTANYL CITRATE (PF) 100 MCG/2ML IJ SOLN
12.5000 ug | INTRAMUSCULAR | Status: DC | PRN
  Administered 2017-05-03 (×2): 12.5 ug via INTRAVENOUS
  Filled 2017-05-03 (×2): qty 2

## 2017-05-03 MED ORDER — GABAPENTIN 300 MG PO CAPS
300.0000 mg | ORAL_CAPSULE | Freq: Every day | ORAL | Status: DC
  Administered 2017-05-03: 300 mg via ORAL
  Filled 2017-05-03: qty 1

## 2017-05-03 MED ORDER — DEXTROSE 5 % IV SOLN
1.0000 g | Freq: Two times a day (BID) | INTRAVENOUS | Status: DC
  Administered 2017-05-03 – 2017-05-04 (×3): 1 g via INTRAVENOUS
  Filled 2017-05-03 (×4): qty 1

## 2017-05-03 MED ORDER — ORAL CARE MOUTH RINSE
15.0000 mL | Freq: Two times a day (BID) | OROMUCOSAL | Status: DC
  Administered 2017-05-03 – 2017-05-05 (×5): 15 mL via OROMUCOSAL

## 2017-05-03 MED ORDER — FENTANYL CITRATE (PF) 100 MCG/2ML IJ SOLN
50.0000 ug | INTRAMUSCULAR | Status: DC | PRN
  Administered 2017-05-03 – 2017-05-04 (×7): 50 ug via INTRAVENOUS
  Administered 2017-05-04: 25 ug via INTRAVENOUS
  Administered 2017-05-04 – 2017-05-05 (×2): 50 ug via INTRAVENOUS
  Filled 2017-05-03 (×10): qty 2

## 2017-05-03 MED ORDER — SODIUM CHLORIDE 0.9 % IV SOLN
Freq: Once | INTRAVENOUS | Status: AC
  Administered 2017-05-03: 16:00:00 via INTRAVENOUS

## 2017-05-03 MED ORDER — SODIUM CHLORIDE 0.45 % IV SOLN
INTRAVENOUS | Status: DC
  Administered 2017-05-03 – 2017-05-04 (×2): via INTRAVENOUS

## 2017-05-03 MED ORDER — SODIUM CHLORIDE 0.9 % IV BOLUS (SEPSIS)
1000.0000 mL | Freq: Once | INTRAVENOUS | Status: AC
  Administered 2017-05-03: 1000 mL via INTRAVENOUS

## 2017-05-03 MED ORDER — GABAPENTIN 600 MG PO TABS
300.0000 mg | ORAL_TABLET | Freq: Every day | ORAL | Status: DC
  Filled 2017-05-03: qty 0.5

## 2017-05-03 NOTE — Progress Notes (Signed)
Subjective/Chief Complaint: Formed stools, lower abd pain somewhat less   Objective: Vital signs in last 24 hours: Temp:  [97.7 F (36.5 C)-98.5 F (36.9 C)] 98.4 F (36.9 C) (09/17 0800) Pulse Rate:  [67-97] 97 (09/17 0900) Resp:  [18-29] 29 (09/17 0900) BP: (85-131)/(34-91) 119/44 (09/17 0900) SpO2:  [90 %-100 %] 98 % (09/17 0900) Weight:  [80.9 kg (178 lb 5.6 oz)-81.2 kg (179 lb)] 80.9 kg (178 lb 5.6 oz) (09/17 0200) Last BM Date: 05/03/17  Intake/Output from previous day: 09/16 0701 - 09/17 0700 In: 2576.8 [I.V.:2576.8] Out: 975 [Urine:975] Intake/Output this shift: Total I/O In: 125 [I.V.:125] Out: 300 [Urine:300]  General appearance: cooperative Resp: clear to auscultation bilaterally Cardio: regular rate and rhythm GI: soft, suprapubic tenderness, +BS  Lab Results:   Recent Labs  05/02/17 1253 05/03/17 0429  WBC 32.1* 22.0*  HGB 13.9 12.5  HCT 40.4 36.5  PLT 346 269   BMET  Recent Labs  05/02/17 1253 05/03/17 0429  NA 131* 134*  K 3.8 3.9  CL 94* 105  CO2 21* 19*  GLUCOSE 157* 109*  BUN 34* 33*  CREATININE 2.11* 1.08*  CALCIUM 8.5* 7.5*   PT/INR  Recent Labs  05/02/17 2148  LABPROT 18.2*  INR 1.52   ABG No results for input(s): PHART, HCO3 in the last 72 hours.  Invalid input(s): PCO2, PO2  Studies/Results: Ct Abdomen Pelvis Wo Contrast  Result Date: 05/02/2017 CLINICAL DATA:  83 year old female with abdominal pain and distension. Sepsis. EXAM: CT ABDOMEN AND PELVIS WITHOUT CONTRAST TECHNIQUE: Multidetector CT imaging of the abdomen and pelvis was performed following the standard protocol without IV contrast. Intravenous contrast was not administered secondary to acute kidney injury. COMPARISON:  01/14/2017 CT FINDINGS: Please note that parenchymal abnormalities may be missed without intravenous contrast. Lower chest: Mild to moderate bibasilar atelectasis noted. Hepatobiliary: No hepatic abnormalities identified. The patient is  status post cholecystectomy. No biliary dilatation. Pancreas: Unremarkable except for atrophy Spleen: Unremarkable Adrenals/Urinary Tract: Small foci of gas within the bladder wall noted likely representing emphysematous cystitis. The kidneys are unremarkable. Mild fullness of the adrenal glands again noted. No evidence of hydronephrosis or obstructing urinary calculi. Stomach/Bowel: Mildly distended fluid and gas-filled colon noted with large amount of gas in the distal sigmoid colon and rectum. Pericolonic inflammation of the descending and sigmoid colon noted as well of the rectum. No dilated small bowel loops are noted. No evidence of pneumoperitoneum. The appendix is normal. Vascular/Lymphatic: Aortic atherosclerosis. No enlarged abdominal or pelvic lymph nodes. Reproductive: Uterus and bilateral adnexa are unremarkable. Other: No free fluid or definite focal collection. Musculoskeletal: No acute or suspicious bony abnormality. Lumbar spine surgical changes noted. IMPRESSION: 1. Inflammation adjacent to the descending colon, sigmoid colon and rectum -question colitis. Large amount of stool within the distal sigmoid colon and rectum, with the remainder of the colon mildly distended with gas and fluid. 2. Small foci of gas within the bladder wall likely representing emphysematous cystitis. 3. Mild to moderate bibasilar atelectasis 4.  Aortic Atherosclerosis (ICD10-I70.0). Electronically Signed   By: Harmon Pier M.D.   On: 05/02/2017 18:37   US Renal  Result Date: 05/02/2017 CLINICAL DATA:  Acute kidney injury EXAM: RENAL / URINARY TRACT ULTRASOUND COMPLETE COMPARISON:  01/14/2017 FINDINGS: Right Kidney: Length: 10.0 cm. Normal echogenicity. No hydronephrosis. Small renal cortical cysts in the midpole regions measure 1.5 cm or less in diameter. At least 2 cysts visualized. Left Kidney: Length: 12.4 cm. Echogenicity within normal limits. No mass or  hydronephrosis visualized. Bladder: Completely empty.  Limited  assess by ultrasound IMPRESSION: Incidental small renal cysts on the right kidney otherwise no acute finding by ultrasound. Negative for hydronephrosis. Electronically Signed   By: Judie Petit.  Shick M.D.   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 No surgery needed from our standpoint. I spoke with her family as well.  LOS: 1 day    Twylia Oka E 05/03/2017

## 2017-05-03 NOTE — Progress Notes (Signed)
eLink Physician-Brief Progress Note Patient Name: Sharon Gilmore DOB: 04-Jul-1934 MRN: [id number]   Date of Service  05/03/2017  HPI/Events of Note  Sinus Tachycardia - HR = 120's to 150's.  eICU Interventions  Will order: 1. Metoprolol 2.5 - 5 mg IV Q 3 hours PRN HR > 115.      Intervention Category Major Interventions: Arrhythmia - evaluation and management  Jalayia Bagheri Eugene 05/03/2017, 10:55 PM

## 2017-05-03 NOTE — Consult Note (Signed)
Name: Sharon Gilmore MRN: [id number] DOB: 06-28-1934    ADMISSION DATE:  05/02/2017 CONSULTATION DATE:  05/02/17  REFERRING MD :  Dr. 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   The patient was recently admitted from 9/7-9/11 for L2 to L5 decompression, fusion with posterolateral arthrodesis.  The patient had difficulty with pain control and constipation in the setting of narcotics.  She was discharged to [facility] for rehab efforts.  The patient and family reports she never felt well after discharge.  She reports weakness & fatigue.  In the two days prior to admit, she had decreased appetite, lower abdominal pain, nausea, and pain / "coldness" in her feet.  Initial ER evaluation found her to be hypotensive to 89 systolic.  Initial labs - Na 131, K 3.8, Cl 94, CO2 21, glucose 157, BUN 34 / Sr Cr 2.11, AG 16, Alk Phos 156, albumin 2.9, AST 64, ALT 272, lactic acid 4.14, WBC 32.1, Hgb 13.9 and platelets 346.  UA was positive for WBC TNTC, many bacteria.  She was treated with NSx2L & rocephin with improvement in her blood pressure to 025 systolic.    PCCM consulted for evaluation of severe sepsis.    SUBJECTIVE: low dose neo  VITAL SIGNS: Temp:  [97.7 F (36.5 C)-98.5 F (36.9 C)] 98.4 F (36.9 C) (09/17 0800) Pulse Rate:  [67-97] 97 (09/17 0900) Resp:  [18-29] 29 (09/17 0900) BP: (85-131)/(34-91) 119/44 (09/17 0900) SpO2:  [90 %-100 %] 98 % (09/17 0900) Weight:  [80.9 kg (178 lb 5.6 oz)-81.2 kg (179 lb)] 80.9 kg (178 lb 5.6 oz) (09/17 0200)  PHYSICAL EXAMINATION: General: awake, fc Neuro: nonfocal HEENT: jvd low PULM: CTA CV:  s1 s2 RRR m present GI: soft, distended, LLL pain on  Palp, no r/g Extremities: no rash, no edema     Recent Labs Lab 05/02/17 1253  05/03/17 0429  NA 131* 134*  K 3.8 3.9  CL 94* 105  CO2 21* 19*  BUN 34* 33*  CREATININE 2.11* 1.08*  GLUCOSE 157* 109*     Recent Labs Lab 05/02/17 1253 05/03/17 0429  HGB 13.9 12.5  HCT 40.4 36.5  WBC 32.1* 22.0*  PLT 346 269    Ct Abdomen Pelvis Wo Contrast  Result Date: 05/02/2017 CLINICAL DATA:  83 year old female with abdominal pain and distension. Sepsis. EXAM: CT ABDOMEN AND PELVIS WITHOUT CONTRAST TECHNIQUE: Multidetector CT imaging of the abdomen and pelvis was performed following the standard protocol without IV contrast. Intravenous contrast was not administered secondary to acute kidney injury. COMPARISON:  01/14/2017 CT FINDINGS: Please note that parenchymal abnormalities may be missed without intravenous contrast. Lower chest: Mild to moderate bibasilar atelectasis noted. Hepatobiliary: No hepatic abnormalities identified. The patient is status post cholecystectomy. No biliary dilatation. Pancreas: Unremarkable except for atrophy Spleen: Unremarkable Adrenals/Urinary Tract: Small foci of gas within the bladder wall noted likely representing emphysematous cystitis. The kidneys are unremarkable. Mild fullness of the adrenal glands again noted. No evidence of hydronephrosis or obstructing urinary calculi. Stomach/Bowel: Mildly distended fluid and gas-filled colon noted with large amount of gas in the distal sigmoid colon and rectum. Pericolonic inflammation of the descending and sigmoid colon noted as well of the rectum. No dilated small bowel loops are noted. No  evidence of pneumoperitoneum. The appendix is normal. Vascular/Lymphatic: Aortic atherosclerosis. No enlarged abdominal or pelvic lymph nodes. Reproductive: Uterus and bilateral adnexa are unremarkable. Other: No free fluid or definite focal collection. Musculoskeletal: No acute or suspicious bony abnormality. Lumbar spine surgical changes noted. IMPRESSION: 1. Inflammation adjacent to the descending colon, sigmoid colon  and rectum -question colitis. Large amount of stool within the distal sigmoid colon and rectum, with the remainder of the colon mildly distended with gas and fluid. 2. Small foci of gas within the bladder wall likely representing emphysematous cystitis. 3. Mild to moderate bibasilar atelectasis 4.  Aortic Atherosclerosis (ICD10-I70.0). Electronically Signed   By: Margarette Canada M.D.   On: 05/02/2017 18:37   US Renal  Result Date: 05/02/2017 CLINICAL DATA:  Acute kidney injury EXAM: RENAL / URINARY TRACT ULTRASOUND COMPLETE COMPARISON:  01/14/2017 FINDINGS: Right Kidney: Length: 10.0 cm. Normal echogenicity. No hydronephrosis. Small renal cortical cysts in the midpole regions measure 1.5 cm or less in diameter. At least 2 cysts visualized. Left Kidney: Length: 12.4 cm. Echogenicity within normal limits. No mass or hydronephrosis visualized. Bladder: Completely empty.  Limited assess by ultrasound IMPRESSION: Incidental small renal cysts on the right kidney otherwise no acute finding by ultrasound. Negative for hydronephrosis. Electronically Signed   By: Jerilynn Mages.  Shick M.D.   On: 05/02/2017 17:22      SIGNIFICANT EVENTS  9/16  Admit with severe sepsis secondary to UTI   STUDIES:  Renal US 9/16 >> cysts, no hydro CT air bladder, colitis  ANTIBIOTICS Rocephin 2g 9/16 >>9/17 ceftaz 9/1>>>  CULTURES BCx2 9/16 >>  UA 9/16 >> positive UTI  UC 9/16 >>   ASSESSMENT / PLAN:  Discussion: 83 y/o F admitted with severe sepsis secondary to UTI.  Recently admitted for L2-L5 decompression / fusion.  Responding to IVF's.    Septic shock secondary to UTI Emphysematous cystitis colitits hypovolemia Plan:  Change ctx to ceftaz as from NH Low threshold to add vanc if not off pressors Neo to goal map 55-60, sys 90 Cortisol if remains in shock Bolus clinically   AKI - secondary to sepsis  NONAG  ATN Plan: bmet in am  Avoid saline unless bolus needed To 1/2 NS  Nausea - without vomiting,  resolved colitits Plan: Clear liquid diet as tolerated - doing well Surgery sign off   Recent Decompression / Fusion Plan: PT efforts asap Take down dressing  ID UTI colititis associated Air bladder - frmo NH, to ceftaz   Ccm time 30 min   Lavon Paganini. Titus Mould, MD, [place] Pgr: [facility]

## 2017-05-03 NOTE — Progress Notes (Signed)
Called CCM again as I had not been contacted back awaiting call back. Huntley Estelle E, RN 05/03/2017 1628

## 2017-05-03 NOTE — Progress Notes (Addendum)
FPTS Interim Progress Note  S:Patient restless and would like to get up and move around. Says she is feeling much better today.  O: BP 131/83   Pulse 91   Temp 97.9 F (36.6 C) (Oral)   Resp (!) 23   Ht  (1.626 m)   Wt 178 lb 5.6 oz (80.9 kg)   SpO2 97%   BMI 30.61 kg/m   General: NAD, pleasant Card: RRR GI: tender to palpation in LLQ, normoactive BS  A/P: Appreciate CCM, we will resume care when patient is out of ICU.  Nick Stults, [place], DO 05/03/2017, 8:05 AM PGY-1, [facility] pager [phone]

## 2017-05-03 NOTE — Progress Notes (Signed)
Called Elink regarding EKG, SOB, ST, and increasing LLQ pain unrelieved by fentanyl. CCM to call back when he has a moment. Huntley Estelle E, RN 05/03/2017 .[phone]

## 2017-05-03 NOTE — Progress Notes (Addendum)
eLink Physician-Brief Progress Note Patient Name: LOUGENIA MORRISSEY DOB: 1933-10-17 MRN: [id number]   Date of Service  05/03/2017  HPI/Events of Note  Patient is c/o severe abdominal pain. CT Abdomen and Pelvis from 05/02/2017  -1. Inflammation adjacent to the descending colon, sigmoid colon and rectum -question colitis. Large amount of stool within the distal sigmoid colon and rectum, with the remainder of the colon mildly distended with gas and fluid. 2. Small foci of gas within the bladder wall likely representing emphysematous cystitis. 3. Mild to moderate bibasilar atelectasis 4.  Aortic Atherosclerosis (ICD10-I70.0). SBP = 120 and Last LVEF = 55% to 60%.  eICU Interventions  Will order: 1. CBC with diff, amylase, lipase and KUB film now.  2. Bolus with 0.9 NaCl 1 liter IV over 1 hour now. 3. Increase Fentanyl IV to 50 mcg IV Q 2 hours PRN pain.      Intervention Category Intermediate Interventions: Pain - evaluation and management  Sommer,Steven Eugene 05/03/2017, 4:51 PM

## 2017-05-03 NOTE — Progress Notes (Signed)
eLink Physician-Brief Progress Note Patient Name: Sharon Gilmore DOB: Apr 28, 1934 MRN: [id number]   Date of Service  05/03/2017  HPI/Events of Note  Review of labs >> Amylase = 410, Lipase = 20. WBC = 14.2 and Creatinine = 1.08. KUB film - Progressive gaseous distention of small bowel and portions of colon which may reflect ileus or partial obstruction secondary to distal Colitis. The possibility of free intraperitoneal air cannot be assessed on a supine view. However, there was no free air on a CT Scan from 05/02/2017.   eICU Interventions  Will order: 1. NGT to LIS.      Intervention Category Major Interventions: Other:  Lenell Antu 05/03/2017, 8:02 PM

## 2017-05-04 ENCOUNTER — Inpatient Hospital Stay (HOSPITAL_COMMUNITY): Payer: Medicare Other

## 2017-05-04 DIAGNOSIS — B259 Cytomegaloviral disease, unspecified: Secondary | ICD-10-CM

## 2017-05-04 DIAGNOSIS — K529 Noninfective gastroenteritis and colitis, unspecified: Secondary | ICD-10-CM

## 2017-05-04 DIAGNOSIS — A0839 Other viral enteritis: Secondary | ICD-10-CM

## 2017-05-04 LAB — URINE CULTURE

## 2017-05-04 LAB — GLUCOSE, CAPILLARY
GLUCOSE-CAPILLARY: 119 mg/dL — AB (ref 65–99)
GLUCOSE-CAPILLARY: 121 mg/dL — AB (ref 65–99)
GLUCOSE-CAPILLARY: 122 mg/dL — AB (ref 65–99)
GLUCOSE-CAPILLARY: 123 mg/dL — AB (ref 65–99)
Glucose-Capillary: 111 mg/dL — ABNORMAL HIGH (ref 65–99)

## 2017-05-04 LAB — CBC WITH DIFFERENTIAL/PLATELET
Basophils Absolute: 0 K/uL (ref 0.0–0.1)
Basophils Relative: 0 %
Eosinophils Absolute: 0 K/uL (ref 0.0–0.7)
Eosinophils Relative: 0 %
HCT: 33.6 % — ABNORMAL LOW (ref 36.0–46.0)
Hemoglobin: 11.4 g/dL — ABNORMAL LOW (ref 12.0–15.0)
Lymphocytes Relative: 3 %
Lymphs Abs: 0.5 K/uL — ABNORMAL LOW (ref 0.7–4.0)
MCH: 28.9 pg (ref 26.0–34.0)
MCHC: 33.9 g/dL (ref 30.0–36.0)
MCV: 85.1 fL (ref 78.0–100.0)
Monocytes Absolute: 0.2 K/uL (ref 0.1–1.0)
Monocytes Relative: 1 %
Neutro Abs: 17.3 K/uL — ABNORMAL HIGH (ref 1.7–7.7)
Neutrophils Relative %: 96 %
Platelets: 262 K/uL (ref 150–400)
RBC: 3.95 MIL/uL (ref 3.87–5.11)
RDW: 16.7 % — ABNORMAL HIGH (ref 11.5–15.5)
WBC: 18 K/uL — ABNORMAL HIGH (ref 4.0–10.5)

## 2017-05-04 LAB — COMPREHENSIVE METABOLIC PANEL
ALK PHOS: 97 U/L (ref 38–126)
ALT: 112 U/L — AB (ref 14–54)
AST: 43 U/L — ABNORMAL HIGH (ref 15–41)
Albumin: 1.8 g/dL — ABNORMAL LOW (ref 3.5–5.0)
Anion gap: 9 (ref 5–15)
BILIRUBIN TOTAL: 0.7 mg/dL (ref 0.3–1.2)
BUN: 21 mg/dL — ABNORMAL HIGH (ref 6–20)
CALCIUM: 7.8 mg/dL — AB (ref 8.9–10.3)
CO2: 17 mmol/L — ABNORMAL LOW (ref 22–32)
Chloride: 108 mmol/L (ref 101–111)
Creatinine, Ser: 0.62 mg/dL (ref 0.44–1.00)
Glucose, Bld: 126 mg/dL — ABNORMAL HIGH (ref 65–99)
Potassium: 3.1 mmol/L — ABNORMAL LOW (ref 3.5–5.1)
Sodium: 134 mmol/L — ABNORMAL LOW (ref 135–145)
Total Protein: 5.2 g/dL — ABNORMAL LOW (ref 6.5–8.1)

## 2017-05-04 LAB — MAGNESIUM: Magnesium: 1.9 mg/dL (ref 1.7–2.4)

## 2017-05-04 MED ORDER — GABAPENTIN 250 MG/5ML PO SOLN
300.0000 mg | Freq: Once | ORAL | Status: AC
  Administered 2017-05-04: 300 mg
  Filled 2017-05-04: qty 6

## 2017-05-04 MED ORDER — POTASSIUM CHLORIDE 10 MEQ/100ML IV SOLN
10.0000 meq | INTRAVENOUS | Status: AC
  Administered 2017-05-04 (×4): 10 meq via INTRAVENOUS
  Filled 2017-05-04 (×5): qty 100

## 2017-05-04 MED ORDER — IOPAMIDOL (ISOVUE-300) INJECTION 61%
INTRAVENOUS | Status: AC
  Administered 2017-05-04: 100 mL
  Filled 2017-05-04: qty 100

## 2017-05-04 MED ORDER — CIPROFLOXACIN IN D5W 400 MG/200ML IV SOLN
400.0000 mg | Freq: Two times a day (BID) | INTRAVENOUS | Status: DC
  Administered 2017-05-04 – 2017-05-09 (×10): 400 mg via INTRAVENOUS
  Filled 2017-05-04 (×11): qty 200

## 2017-05-04 MED ORDER — GABAPENTIN 250 MG/5ML PO SOLN
300.0000 mg | Freq: Every day | ORAL | Status: DC
Start: 2017-05-04 — End: 2017-05-08
  Administered 2017-05-04 – 2017-05-07 (×4): 300 mg
  Filled 2017-05-04 (×7): qty 6

## 2017-05-04 MED ORDER — METRONIDAZOLE IN NACL 5-0.79 MG/ML-% IV SOLN
500.0000 mg | Freq: Four times a day (QID) | INTRAVENOUS | Status: DC
  Administered 2017-05-04 – 2017-05-09 (×19): 500 mg via INTRAVENOUS
  Filled 2017-05-04 (×21): qty 100

## 2017-05-04 MED ORDER — POTASSIUM CHLORIDE 10 MEQ/100ML IV SOLN
10.0000 meq | INTRAVENOUS | Status: AC
  Administered 2017-05-04 (×2): 10 meq via INTRAVENOUS
  Filled 2017-05-04 (×2): qty 100

## 2017-05-04 MED ORDER — IOPAMIDOL (ISOVUE-300) INJECTION 61%
INTRAVENOUS | Status: AC
  Administered 2017-05-04: 30 mL
  Filled 2017-05-04: qty 30

## 2017-05-04 MED ORDER — WHITE PETROLATUM GEL
Status: AC
  Administered 2017-05-04: 12:00:00
  Filled 2017-05-04: qty 1

## 2017-05-04 MED ORDER — BISACODYL 10 MG RE SUPP
10.0000 mg | Freq: Every day | RECTAL | Status: DC | PRN
  Administered 2017-05-04: 10 mg via RECTAL
  Filled 2017-05-04: qty 1

## 2017-05-04 MED ORDER — BISACODYL 5 MG PO TBEC: 5.0000 mg | DELAYED_RELEASE_TABLET | Freq: Every day | ORAL | Status: DC | PRN

## 2017-05-04 NOTE — Progress Notes (Signed)
eLink Physician-Brief Progress Note Patient Name: Sharon Gilmore DOB: 11/27/1933 MRN: [id number]   Date of Service  05/04/2017  HPI/Events of Note  Patient with difficulty sleeping. Received gabapentin at 11:30 AM yesterday. Ordered for Neurontin daily at bedtime. Currently has an enteric feeding tube in place.   eICU Interventions  1. Gabapentin 300 mg via tube 1 2. Resuming gabapentin 300 mg via tube daily at bedtime starting this evening      Intervention Category Intermediate Interventions: Other:  Lawanda Cousins 05/04/2017, 12:58 AM

## 2017-05-04 NOTE — Progress Notes (Signed)
Teaching service paged at 23:30 concerning increased agitation and restlessness due to the patient not sleeping in 2 days.

## 2017-05-04 NOTE — Progress Notes (Signed)
Family Medicine Teaching Service Daily Progress Note Intern Pager: [phone]  Patient name: Sharon Gilmore Medical record number: [id number] Date of birth: 02-03-1934 Age: 83 y.o. Gender: female  Primary Care Provider: Olivia Canter, MD Consultants: CCM, Urology, General surgery Code Status: Full  Pt Overview and Major Events to Date:  09/16 - admitted for hypotension, abd pain, urosepsis 09/16 - transfer to ICU for refractive hypotension, shock 09/16 - CT - rectosigmoid colitis 09/17 - KUB possible SBO vs ileus 09/18 - Family Medicine resume care  Assessment and Plan: Sharon Gilmore is a 83 y.o. female presenting with hypotension, abdominal pain, UA suggestive of urosepsis. PMH is significant for HTN, lumbar stenosis with recent decompression and fusion 04/2017, AV block s/p pacemaker 02/2017, asthma.   Rectosigmoid Colitis, complicated by ileus vs SBO LQ abd pain out of proportion to exam on admission, CT abd 9/16 showed rectosigmoid colitis, emphysematous bladder. KUB 9/17 showed possible SBO vs ileus. NGT in place. Abdominal pain less than yesterday.  - Ceftaz (9/17-) - NGT - NPO - 1/2NS 36ml/hr - repeat CT abd/pelvis - fentanyl q2h PRN pain, encourage movement - surgery following - CMP, CBC  Urosepsis: hypotensive at SNF, BP 118/70 after 500cc bolus in EMS truck. Lactic acid 4.14, WBC 32. UA concerning for urosepsis (cloudy, leuks, TNTC WBC, many bacteria, 0-5 squams) in the setting of recent surgery with foley placement and patient with urinary frequency.  Based on UA, leading differential is urosepsis, however patient with abdominal pain out of proportion to exam. CT abd showed rectosigmoid colitis and emphysematous bladder. Renal U/S showed cysts, no hydro. Slightly improved hypotension despite fluid rehydration, concern for severe sepsis, transferred to ICU where she required pressors. S/p rocephin (9/16-9/17). Urine culture grew pansensitive Ecoli. - cardiac  monitoring - vitals q4 hours - Ceftaz (9/17-) - APTT and INR - follow blood cultures - NGx1d - EKG - CBC  Elevated transaminases: AST 222 and ALT 64 on admssion, last checked and WNL 03/13/2017. Normalizing today 9/18. Elevated LFTs likely 2/2 shock liver with hypotension and lactic acidosis.  -CMP  AKI, resolved: baseline Cr. 0.5. Cr on admit 2.11 in the setting of hypovolemia 2/2 sepsis (dry mucuous membranes on exam). Cause of AKI likely shock 2/2 sepsis vs urinary retention. Renal U/S neg for hydro. Cr 9/18 0.62 - strict I/Os - monitor fluid status - monitor Cr  Hx of spinal surgery: recent spinal surgery with Dr. Venetia Maxon (L2-L5 decompression and fusion) on 04/23/17. Prior to this, lived in independent apartment at [place]. D/c to SNF for rehab. No signs of infection or bleeding over site, patient denies back pain. -monitor on exam  HTN: home meds are enalapril, HCTZ, lopressor.  -hold home meds in the setting of sepsis and hypotension  Hx of high degree AV block, s/p pacemaker: pacer placed 03/08/17. Will observe blood cultures, if positive, may need to consider echo to rule out vegetations on leads.  -monitor blood cultures -monitor on tele  Asthma: patient reports no inhalers for several years, also denies URIs or hx of pneumonia. -monitor O2 sats on vitals  HLD: home med is lipitor . Hold in the setting of elevated transaminases.  -hold home lipitor  FEN/GI: NPO  Prophylaxis: SCDs  Disposition: continued inpatient care of colitis, urosepsis.  Subjective:  Pt is hungry and wants to eat. Wants NGT out. Abd pain less than yesterday but still hurting.  Objective: Temp:  [97.4 F (36.3 C)-98.7 F (37.1 C)] 97.4 F (36.3 C) (09/18 0809) Pulse Rate:  [  97-123] 109 (09/18 0700) Resp:  [19-36] 19 (09/18 0700) BP: (108-152)/(41-95) 140/69 (09/18 0700) SpO2:  [93 %-99 %] 98 % (09/18 0700)  Physical Exam: General: elderly female lying in bed, in mod  distress Cardiovascular: RRR, no murmur Respiratory: CTA bilaterally anteriorly, no wheezes/crackles Abdomen: tender to palpation in lower quadrants, hypoactive BS, distended, no rebound or guarding Extremities: mottled LE, no LE edema  Laboratory:  Recent Labs Lab 05/03/17 0429 05/03/17 1649 05/04/17 0217  WBC 22.0* 14.2* 18.0*  HGB 12.5 11.2* 11.4*  HCT 36.5 33.6* 33.6*  PLT 269 249 262    Recent Labs Lab 05/02/17 1253 05/03/17 0429 05/04/17 0217  NA 131* 134* 134*  K 3.8 3.9 3.1*  CL 94* 105 108  CO2 21* 19* 17*  BUN 34* 33* 21*  CREATININE 2.11* 1.08* 0.62  CALCIUM 8.5* 7.5* 7.8*  PROT 6.4* 5.6* 5.2*  BILITOT 0.9 0.9 0.7  ALKPHOS 156* 114 97  ALT 272* 165* 112*  AST 64* 55* 43*  GLUCOSE 157* 109* 126*    Imaging/Diagnostic Tests:  Ct Abdomen Pelvis Wo Contrast  Result Date: 05/02/2017 CLINICAL DATA:  83 year old female with abdominal pain and distension. Sepsis. EXAM: CT ABDOMEN AND PELVIS WITHOUT CONTRAST TECHNIQUE: Multidetector CT imaging of the abdomen and pelvis was performed following the standard protocol without IV contrast. Intravenous contrast was not administered secondary to acute kidney injury. COMPARISON:  01/14/2017 CT FINDINGS: Please note that parenchymal abnormalities may be missed without intravenous contrast. Lower chest: Mild to moderate bibasilar atelectasis noted. Hepatobiliary: No hepatic abnormalities identified. The patient is status post cholecystectomy. No biliary dilatation. Pancreas: Unremarkable except for atrophy Spleen: Unremarkable Adrenals/Urinary Tract: Small foci of gas within the bladder wall noted likely representing emphysematous cystitis. The kidneys are unremarkable. Mild fullness of the adrenal glands again noted. No evidence of hydronephrosis or obstructing urinary calculi. Stomach/Bowel: Mildly distended fluid and gas-filled colon noted with large amount of gas in the distal sigmoid colon and rectum. Pericolonic inflammation  of the descending and sigmoid colon noted as well of the rectum. No dilated small bowel loops are noted. No evidence of pneumoperitoneum. The appendix is normal. Vascular/Lymphatic: Aortic atherosclerosis. No enlarged abdominal or pelvic lymph nodes. Reproductive: Uterus and bilateral adnexa are unremarkable. Other: No free fluid or definite focal collection. Musculoskeletal: No acute or suspicious bony abnormality. Lumbar spine surgical changes noted. IMPRESSION: 1. Inflammation adjacent to the descending colon, sigmoid colon and rectum -question colitis. Large amount of stool within the distal sigmoid colon and rectum, with the remainder of the colon mildly distended with gas and fluid. 2. Small foci of gas within the bladder wall likely representing emphysematous cystitis. 3. Mild to moderate bibasilar atelectasis 4.  Aortic Atherosclerosis (ICD10-I70.0). Electronically Signed   By: Harmon Pier M.D.   On: 05/02/2017 18:37   US Renal  Result Date: 05/02/2017 CLINICAL DATA:  Acute kidney injury EXAM: RENAL / URINARY TRACT ULTRASOUND COMPLETE COMPARISON:  01/14/2017 FINDINGS: Right Kidney: Length: 10.0 cm. Normal echogenicity. No hydronephrosis. Small renal cortical cysts in the midpole regions measure 1.5 cm or less in diameter. At least 2 cysts visualized. Left Kidney: Length: 12.4 cm. Echogenicity within normal limits. No mass or hydronephrosis visualized. Bladder: Completely empty.  Limited assess by ultrasound IMPRESSION: Incidental small renal cysts on the right kidney otherwise no acute finding by ultrasound. Negative for hydronephrosis. Electronically Signed   By: Judie Petit.  Shick M.D.   On: 05/02/2017 17:22   Dg Abd Portable 1v  Result Date: 05/04/2017 CLINICAL DATA:  NG tube placement EXAM: PORTABLE ABDOMEN - 1 VIEW COMPARISON:  05/03/2017 FINDINGS: Esophageal tube tip is in the left upper quadrant in the region of gastric fundus. There are multiple dilated loops of small bowel within the central abdomen  measuring up to 3.8 cm, concerning for a bowel obstruction. Surgical hardware in the lumbar spine. IMPRESSION: Esophageal tube tip projects over the gastric fundus. Multiple loops of dilated central small bowel are concerning for bowel obstruction Electronically Signed   By: Jasmine Pang M.D.   On: 05/04/2017 03:38   Dg Abd Portable 1v  Result Date: 05/03/2017 CLINICAL DATA:  Nasogastric tube placement. EXAM: PORTABLE ABDOMEN - 1 VIEW COMPARISON:  Plain film of the abdomen from earlier same day. FINDINGS: Nasogastric tube is adequately positioned in the stomach with tip directed towards the stomach fundus. Dilated gas-filled small bowel loops are again seen within the central abdomen, incompletely imaged. IMPRESSION: Nasogastric tube appears adequately positioned in the stomach. Persistent evidence of ileus versus small bowel obstruction, incompletely imaged. Electronically Signed   By: Bary Richard M.D.   On: 05/03/2017 20:59   Dg Abd Portable 1v  Result Date: 05/03/2017 CLINICAL DATA:  83 year old female with abdominal pain. Subsequent encounter. EXAM: PORTABLE ABDOMEN - 1 VIEW COMPARISON:  05/02/2017 CT. FINDINGS: Progressive gaseous distention of small bowel and portions of colon which may reflect ileus or partial obstruction secondary to distal colitis. The possibility of free intraperitoneal air cannot be assessed on a supine view. Degenerative changes lumbar spine. IMPRESSION: Progressive gaseous distention of small bowel and portions of colon which may reflect ileus or partial obstruction secondary to distal colitis. The possibility of free intraperitoneal air cannot be assessed on a supine view. Electronically Signed   By: Lacy Duverney M.D.   On: 05/03/2017 18:18     Ellwood Dense, DO 05/04/2017, 8:15 AM PGY-1, [facility] FPTS Intern pager: [phone], text pages welcome

## 2017-05-04 NOTE — Progress Notes (Signed)
Pt pulled NG tube half way out of her nose. Tube advanced, x-ray ordered. Will continue to monitor.

## 2017-05-04 NOTE — Progress Notes (Signed)
Subjective/Chief Complaint: Pt with some distention overnight and NGT replaced.  +BMs   Objective: Vital signs in last 24 hours: Temp:  [97.4 F (36.3 C)-98.7 F (37.1 C)] 97.6 F (36.4 C) (09/18 0338) Pulse Rate:  [91-123] 113 (09/18 0600) Resp:  [23-36] 30 (09/18 0600) BP: (95-152)/(41-95) 152/63 (09/18 0600) SpO2:  [93 %-99 %] 97 % (09/18 0600) Last BM Date: 05/03/17  Intake/Output from previous day: 09/17 0701 - 09/18 0700 In: 3307.5 [P.O.:360; I.V.:1847.5; IV Piggyback:1100] Out: 1485 [Urine:1185; Emesis/NG output:300] Intake/Output this shift: Total I/O In: 600 [I.V.:550; IV Piggyback:50] Out: 735 [Urine:435; Emesis/NG output:300]  Constitutional: No acute distress, conversant, appears states age. Eyes: Anicteric sclerae, moist conjunctiva, no lid lag Lungs: Clear to auscultation bilaterally, normal respiratory effort CV: regular rate and rhythm, no murmurs, no peripheral edema, pedal pulses 2+ GI: Soft, no masses or hepatosplenomegaly, non-tender to palpation, hypoactiv eBS Skin: No rashes, palpation reveals normal turgor Psychiatric: appropriate judgment and insight, oriented to person, place, and time  Lab Results:   Recent Labs  05/03/17 1649 05/04/17 0217  WBC 14.2* 18.0*  HGB 11.2* 11.4*  HCT 33.6* 33.6*  PLT 249 262   BMET  Recent Labs  05/03/17 0429 05/04/17 0217  NA 134* 134*  K 3.9 3.1*  CL 105 108  CO2 19* 17*  GLUCOSE 109* 126*  BUN 33* 21*  CREATININE 1.08* 0.62  CALCIUM 7.5* 7.8*   PT/INR  Recent Labs  05/02/17 2148  LABPROT 18.2*  INR 1.52   ABG No results for input(s): PHART, HCO3 in the last 72 hours.  Invalid input(s): PCO2, PO2  Studies/Results: Ct Abdomen Pelvis Wo Contrast  Result Date: 05/02/2017 CLINICAL DATA:  83 year old female with abdominal pain and distension. Sepsis. EXAM: CT ABDOMEN AND PELVIS WITHOUT CONTRAST TECHNIQUE: Multidetector CT imaging of the abdomen and pelvis was performed following the  standard protocol without IV contrast. Intravenous contrast was not administered secondary to acute kidney injury. COMPARISON:  01/14/2017 CT FINDINGS: Please note that parenchymal abnormalities may be missed without intravenous contrast. Lower chest: Mild to moderate bibasilar atelectasis noted. Hepatobiliary: No hepatic abnormalities identified. The patient is status post cholecystectomy. No biliary dilatation. Pancreas: Unremarkable except for atrophy Spleen: Unremarkable Adrenals/Urinary Tract: Small foci of gas within the bladder wall noted likely representing emphysematous cystitis. The kidneys are unremarkable. Mild fullness of the adrenal glands again noted. No evidence of hydronephrosis or obstructing urinary calculi. Stomach/Bowel: Mildly distended fluid and gas-filled colon noted with large amount of gas in the distal sigmoid colon and rectum. Pericolonic inflammation of the descending and sigmoid colon noted as well of the rectum. No dilated small bowel loops are noted. No evidence of pneumoperitoneum. The appendix is normal. Vascular/Lymphatic: Aortic atherosclerosis. No enlarged abdominal or pelvic lymph nodes. Reproductive: Uterus and bilateral adnexa are unremarkable. Other: No free fluid or definite focal collection. Musculoskeletal: No acute or suspicious bony abnormality. Lumbar spine surgical changes noted. IMPRESSION: 1. Inflammation adjacent to the descending colon, sigmoid colon and rectum -question colitis. Large amount of stool within the distal sigmoid colon and rectum, with the remainder of the colon mildly distended with gas and fluid. 2. Small foci of gas within the bladder wall likely representing emphysematous cystitis. 3. Mild to moderate bibasilar atelectasis 4.  Aortic Atherosclerosis (ICD10-I70.0). Electronically Signed   By: Harmon Pier M.D.   On: 05/02/2017 18:37   US Renal  Result Date: 05/02/2017 CLINICAL DATA:  Acute kidney injury EXAM: RENAL / URINARY TRACT ULTRASOUND  COMPLETE COMPARISON:  01/14/2017  FINDINGS: Right Kidney: Length: 10.0 cm. Normal echogenicity. No hydronephrosis. Small renal cortical cysts in the midpole regions measure 1.5 cm or less in diameter. At least 2 cysts visualized. Left Kidney: Length: 12.4 cm. Echogenicity within normal limits. No mass or hydronephrosis visualized. Bladder: Completely empty.  Limited assess by ultrasound IMPRESSION: Incidental small renal cysts on the right kidney otherwise no acute finding by ultrasound. Negative for hydronephrosis. Electronically Signed   By: Judie Petit.  Shick M.D.   On: 05/02/2017 17:22   Dg Abd Portable 1v  Result Date: 05/04/2017 CLINICAL DATA:  NG tube placement EXAM: PORTABLE ABDOMEN - 1 VIEW COMPARISON:  05/03/2017 FINDINGS: Esophageal tube tip is in the left upper quadrant in the region of gastric fundus. There are multiple dilated loops of small bowel within the central abdomen measuring up to 3.8 cm, concerning for a bowel obstruction. Surgical hardware in the lumbar spine. IMPRESSION: Esophageal tube tip projects over the gastric fundus. Multiple loops of dilated central small bowel are concerning for bowel obstruction Electronically Signed   By: Jasmine Pang M.D.   On: 05/04/2017 03:38   Dg Abd Portable 1v  Result Date: 05/03/2017 CLINICAL DATA:  Nasogastric tube placement. EXAM: PORTABLE ABDOMEN - 1 VIEW COMPARISON:  Plain film of the abdomen from earlier same day. FINDINGS: Nasogastric tube is adequately positioned in the stomach with tip directed towards the stomach fundus. Dilated gas-filled small bowel loops are again seen within the central abdomen, incompletely imaged. IMPRESSION: Nasogastric tube appears adequately positioned in the stomach. Persistent evidence of ileus versus small bowel obstruction, incompletely imaged. Electronically Signed   By: Bary Richard M.D.   On: 05/03/2017 20:59   Dg Abd Portable 1v  Result Date: 05/03/2017 CLINICAL DATA:  83 year old female with abdominal pain.  Subsequent encounter. EXAM: PORTABLE ABDOMEN - 1 VIEW COMPARISON:  05/02/2017 CT. FINDINGS: Progressive gaseous distention of small bowel and portions of colon which may reflect ileus or partial obstruction secondary to distal colitis. The possibility of free intraperitoneal air cannot be assessed on a supine view. Degenerative changes lumbar spine. IMPRESSION: Progressive gaseous distention of small bowel and portions of colon which may reflect ileus or partial obstruction secondary to distal colitis. The possibility of free intraperitoneal air cannot be assessed on a supine view. Electronically Signed   By: Lacy Duverney M.D.   On: 05/03/2017 18:18    Anti-infectives: Anti-infectives    Start     Dose/Rate Route Frequency Ordered Stop   05/03/17 1500  cefTRIAXone (ROCEPHIN) 2 g in dextrose 5 % 50 mL IVPB  Status:  Discontinued     2 g 100 mL/hr over 30 Minutes Intravenous Every 24 hours 05/02/17 1438 05/03/17 1004   05/03/17 1030  cefTAZidime (FORTAZ) 1 g in dextrose 5 % 50 mL IVPB     1 g 100 mL/hr over 30 Minutes Intravenous Every 12 hours 05/03/17 1004     05/02/17 1345  cefTRIAXone (ROCEPHIN) 1 g in dextrose 5 % 50 mL IVPB     1 g 100 mL/hr over 30 Minutes Intravenous  Once 05/02/17 1331 05/02/17 1450   05/02/17 1245  cefTRIAXone (ROCEPHIN) 1 g in dextrose 5 % 50 mL IVPB     1 g 100 mL/hr over 30 Minutes Intravenous  Once 05/02/17 1239 05/02/17 1357      Assessment/Plan: 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 No surgery needed from our standpoint.  I spoke with her family as well. Ileus - 2/2 to above.  con't NGT for now and await bowel function, replace lytes  PT consult for mobilization    LOS: 2 days    Marigene Ehlers., [facility] 05/04/2017

## 2017-05-04 NOTE — Evaluation (Addendum)
Physical Therapy Evaluation Patient Details Name: Sharon Gilmore MRN: [id number] DOB: August 01, 1934 Today's Date: 05/04/2017   History of Present Illness  Patient is a 83 y/o female with recent decompression and fusion L2-5 on 9/7 presents from rehab with abdominal pain, nausea, weakness. Admitted with urosepsis. PMH includes deperssion, erythema nodosum, pacemaker, swallowing disorder.  Clinical Impression  Patient presents with lethargy, fluctuating cognition, pain, and impaired mobility s/p above. Tolerated rolling in bed for pericare and to change pad/linens but other mobility limited due to stool incontinence with any movement. RN requested to defer mobility at this time until flexiseal can be placed. Pt with recent spine surgery 9/7 and was getting rehab at [facility]. Reviewed back precautions. Recommend return to SNF once medically stable and will continue to follow to maximize independence and mobility while in hospital.     SNF    Equipment Recommendations  None recommended by PT    Recommendations for Other Services OT consult     Precautions / Restrictions Precautions Precautions: Back;Fall Precaution Booklet Issued: No Precaution Comments: Able to recall 2/3 precautions; NG tube to suction Required Braces or Orthoses: Spinal Brace Spinal Brace: Lumbar corset Restrictions Weight Bearing Restrictions: No Other Position/Activity Restrictions: pacemaker ~1 month ago      Mobility  Bed Mobility Overal bed mobility: Needs Assistance Bed Mobility: Rolling Rolling: Min assist         General bed mobility comments: Easier time rolling to right; more assist needed to roll left to change pads and for pericare. Pt incontinent of stool with any movement. Awaiting flexi seal to be placed.  Transfers                 General transfer comment: Deferred per RN secondary to stool incontinence.   Ambulation/Gait                Stairs             Wheelchair Mobility    Modified Rankin (Stroke Patients Only)       Balance Overall balance assessment: No apparent balance deficits (not formally assessed)                                           Pertinent Vitals/Pain Pain Assessment: Faces Faces Pain Scale: Hurts little more Pain Location: abdomen Pain Descriptors / Indicators: Sore;Aching;Pressure Pain Intervention(s): Monitored during session;Repositioned;Limited activity within patient's tolerance    Home Living Family/patient expects to be discharged to:: Skilled nursing facility                 Additional Comments: From [facility] SNF (due to recent back surgery 9/7).    Prior Function Level of Independence: Needs assistance   Gait / Transfers Assistance Needed: Walking short distances at rehab with assist; Prior to surgery. MOd I with RW.           Hand Dominance   Dominant Hand: Right    Extremity/Trunk Assessment   Upper Extremity Assessment Upper Extremity Assessment: Defer to OT evaluation    Lower Extremity Assessment Lower Extremity Assessment: Generalized weakness (not formally assessed this session.)    Cervical / Trunk Assessment Cervical / Trunk Assessment: Other exceptions Cervical / Trunk Exceptions: s/p back sx Sept 7  Communication   Communication: No difficulties  Cognition Arousal/Alertness: Lethargic Behavior During Therapy: Flat affect   Area of Impairment: Memory;Problem solving  Memory: Decreased short-term memory       Problem Solving: Slow processing General Comments: Cognition fluctuating throughout session; in/out of confusion. A&Ox4. Stating things that are unrelated to questions asked at times.       General Comments General comments (skin integrity, edema, etc.): VSS.    Exercises     Assessment/Plan    PT Assessment Patient needs continued PT services  PT Problem List Decreased range of  motion;Decreased strength;Decreased activity tolerance;Decreased balance;Decreased mobility;Decreased coordination;Decreased knowledge of use of DME;Decreased safety awareness;Decreased knowledge of precautions;Pain;Decreased cognition       PT Treatment Interventions DME instruction;Gait training;Stair training;Therapeutic exercise;Functional mobility training;Therapeutic activities;Balance training;Neuromuscular re-education;Patient/family education;Cognitive remediation    PT Goals (Current goals can be found in the Care Plan section)  Acute Rehab PT Goals Patient Stated Goal: to get better PT Goal Formulation: With patient Time For Goal Achievement: 05/18/17 Potential to Achieve Goals: Fair    Frequency Min 3X/week   Barriers to discharge        Co-evaluation               AM-PAC PT "6 Clicks" Daily Activity  Outcome Measure Difficulty turning over in bed (including adjusting bedclothes, sheets and blankets)?: Unable Difficulty moving from lying on back to sitting on the side of the bed? : Unable Difficulty sitting down on and standing up from a chair with arms (e.g., wheelchair, bedside commode, etc,.)?: Unable Help needed moving to and from a bed to chair (including a wheelchair)?: A Little Help needed walking in hospital room?: A Lot Help needed climbing 3-5 steps with a railing? : A Lot 6 Click Score: 10    End of Session   Activity Tolerance: Patient limited by lethargy;Other (comment) (stool incontinence) Patient left: in bed;with call bell/phone within reach;with bed alarm set Nurse Communication: Mobility status PT Visit Diagnosis: Pain;Other abnormalities of gait and mobility (R26.89);Muscle weakness (generalized) (M62.81) Pain - part of body:  (back)    Time: 6962-9528 PT Time Calculation (min) (ACUTE ONLY): 20 min   Charges:   PT Evaluation $PT Eval Moderate Complexity: 1 Mod     PT G CodesMylo Red, PT,  DPT [phone]    Sharon Gilmore 05/04/2017, 3:15 PM

## 2017-05-05 DIAGNOSIS — I10 Essential (primary) hypertension: Secondary | ICD-10-CM

## 2017-05-05 DIAGNOSIS — I480 Paroxysmal atrial fibrillation: Secondary | ICD-10-CM

## 2017-05-05 DIAGNOSIS — I4891 Unspecified atrial fibrillation: Secondary | ICD-10-CM

## 2017-05-05 DIAGNOSIS — K56609 Unspecified intestinal obstruction, unspecified as to partial versus complete obstruction: Secondary | ICD-10-CM

## 2017-05-05 DIAGNOSIS — I441 Atrioventricular block, second degree: Secondary | ICD-10-CM

## 2017-05-05 DIAGNOSIS — I2510 Atherosclerotic heart disease of native coronary artery without angina pectoris: Secondary | ICD-10-CM

## 2017-05-05 LAB — CBC WITH DIFFERENTIAL/PLATELET
BASOS ABS: 0 10*3/uL (ref 0.0–0.1)
Basophils Relative: 0 %
Eosinophils Absolute: 0 10*3/uL (ref 0.0–0.7)
Eosinophils Relative: 0 %
HCT: 31.4 % — ABNORMAL LOW (ref 36.0–46.0)
HEMOGLOBIN: 10.6 g/dL — AB (ref 12.0–15.0)
LYMPHS ABS: 0.5 10*3/uL — AB (ref 0.7–4.0)
Lymphocytes Relative: 3 %
MCH: 28.5 pg (ref 26.0–34.0)
MCHC: 33.8 g/dL (ref 30.0–36.0)
MCV: 84.4 fL (ref 78.0–100.0)
MONO ABS: 0.9 10*3/uL (ref 0.1–1.0)
Monocytes Relative: 5 %
NEUTROS ABS: 16.9 10*3/uL — AB (ref 1.7–7.7)
Neutrophils Relative %: 92 %
Platelets: 257 10*3/uL (ref 150–400)
RBC: 3.72 MIL/uL — AB (ref 3.87–5.11)
RDW: 16.5 % — AB (ref 11.5–15.5)
WBC: 18.3 10*3/uL — AB (ref 4.0–10.5)

## 2017-05-05 LAB — COMPREHENSIVE METABOLIC PANEL
ALBUMIN: 1.7 g/dL — AB (ref 3.5–5.0)
ALK PHOS: 103 U/L (ref 38–126)
ALT: 85 U/L — ABNORMAL HIGH (ref 14–54)
AST: 42 U/L — AB (ref 15–41)
Anion gap: 7 (ref 5–15)
BILIRUBIN TOTAL: 0.8 mg/dL (ref 0.3–1.2)
BUN: 15 mg/dL (ref 6–20)
CALCIUM: 7.8 mg/dL — AB (ref 8.9–10.3)
CO2: 22 mmol/L (ref 22–32)
Chloride: 103 mmol/L (ref 101–111)
Creatinine, Ser: 0.56 mg/dL (ref 0.44–1.00)
GFR calc Af Amer: >60 mL/min (ref >60)
GLUCOSE: 108 mg/dL — AB (ref 65–99)
POTASSIUM: 3.6 mmol/L (ref 3.5–5.1)
Sodium: 132 mmol/L — ABNORMAL LOW (ref 135–145)
TOTAL PROTEIN: 5 g/dL — AB (ref 6.5–8.1)

## 2017-05-05 LAB — GLUCOSE, CAPILLARY
GLUCOSE-CAPILLARY: 107 mg/dL — AB (ref 65–99)
GLUCOSE-CAPILLARY: 107 mg/dL — AB (ref 65–99)
GLUCOSE-CAPILLARY: 124 mg/dL — AB (ref 65–99)
Glucose-Capillary: 112 mg/dL — ABNORMAL HIGH (ref 65–99)
Glucose-Capillary: 111 mg/dL — ABNORMAL HIGH (ref 65–99)
Glucose-Capillary: 110 mg/dL — ABNORMAL HIGH (ref 65–99)

## 2017-05-05 MED ORDER — FENTANYL CITRATE (PF) 100 MCG/2ML IJ SOLN
12.5000 ug | INTRAMUSCULAR | Status: DC | PRN
  Administered 2017-05-05 – 2017-05-06 (×3): 12.5 ug via INTRAVENOUS
  Filled 2017-05-05 (×3): qty 2

## 2017-05-05 MED ORDER — CHLORHEXIDINE GLUCONATE 0.12 % MT SOLN
15.0000 mL | Freq: Two times a day (BID) | OROMUCOSAL | Status: DC
  Administered 2017-05-06 – 2017-05-10 (×9): 15 mL via OROMUCOSAL
  Filled 2017-05-05 (×6): qty 15

## 2017-05-05 MED ORDER — DILTIAZEM LOAD VIA INFUSION
20.0000 mg | Freq: Once | INTRAVENOUS | Status: AC
  Administered 2017-05-05: 20 mg via INTRAVENOUS
  Filled 2017-05-05: qty 20

## 2017-05-05 MED ORDER — DIPHENHYDRAMINE HCL 50 MG/ML IJ SOLN
25.0000 mg | Freq: Once | INTRAMUSCULAR | Status: AC
  Administered 2017-05-05: 25 mg via INTRAVENOUS
  Filled 2017-05-05: qty 1
  Filled 2017-05-05: qty 0.5

## 2017-05-05 MED ORDER — SODIUM CHLORIDE 0.9 % IV SOLN
INTRAVENOUS | Status: AC
  Administered 2017-05-05: 11:00:00 via INTRAVENOUS

## 2017-05-05 MED ORDER — DIPHENHYDRAMINE HCL 50 MG/ML IJ SOLN
12.5000 mg | Freq: Every evening | INTRAMUSCULAR | Status: AC | PRN
  Administered 2017-05-05: 12.5 mg via INTRAVENOUS
  Filled 2017-05-05: qty 1

## 2017-05-05 MED ORDER — DILTIAZEM HCL 100 MG IV SOLR
5.0000 mg/h | INTRAVENOUS | Status: DC
  Administered 2017-05-05: 15 mg/h via INTRAVENOUS
  Administered 2017-05-05: 10 mg/h via INTRAVENOUS
  Administered 2017-05-05: 5 mg/h via INTRAVENOUS
  Administered 2017-05-06 – 2017-05-08 (×7): 10 mg/h via INTRAVENOUS
  Filled 2017-05-05 (×13): qty 100

## 2017-05-05 MED ORDER — SODIUM CHLORIDE 0.9 % IV SOLN: 25.0000 mg | Freq: Once | INTRAVENOUS | Status: DC

## 2017-05-05 MED ORDER — ORAL CARE MOUTH RINSE
15.0000 mL | Freq: Two times a day (BID) | OROMUCOSAL | Status: DC
  Administered 2017-05-06 – 2017-05-07 (×4): 15 mL via OROMUCOSAL

## 2017-05-05 MED ORDER — BISACODYL 10 MG RE SUPP
10.0000 mg | Freq: Every day | RECTAL | Status: DC
  Administered 2017-05-05 – 2017-05-08 (×4): 10 mg via RECTAL
  Filled 2017-05-05 (×5): qty 1

## 2017-05-05 MED ORDER — POTASSIUM CHLORIDE 10 MEQ/100ML IV SOLN
10.0000 meq | INTRAVENOUS | Status: AC
  Administered 2017-05-05 (×2): 10 meq via INTRAVENOUS
  Filled 2017-05-05 (×2): qty 100

## 2017-05-05 NOTE — Consult Note (Signed)
Cardiology Consultation:   Patient ID: BRANDE UNCAPHER; [id number]; [birth year]   Admit date: 05/02/2017 Date of Consult: 05/05/2017  Primary Care Provider: Olivia Canter, MD Primary Cardiologist: Dr Delton See Primary Electrophysiologist:  Dr Johney Frame   Patient Profile:   Sharon Gilmore is a 82 y.o. female with a hx of a pacemeker who is being seen today for the evaluation of AF at the request of Dr Leveda Anna.  History of Present Illness:   Sharon Gilmore is a pleasant 83 y/o female who was most recently seen by the cardioloy service during a hospital stay in July 2018. Sharon Gilmore was found to have 2:1 AVB and underwent placement of a MDT PCTVD 03/09/17.    Sharon Gilmore then then had c/o ongoing DOE/SOB (uncheaged from prior to her PPM) with an elevated Troponin and underwent LHC 03/15/17. This showed 60% LAD to be managed medically. HerTroponin elevation was suspected to be from demand ischemia secondary to HTN urgency. Sharon Gilmore was discharged 03/17/17 to rehab and seen in f/u 04/07/17.  Sharon Gilmore has know spinal stenosis and on 04/23/17 underwent L2-L5 decompression and fusion. Sharon Gilmore was discharged 04/27/17 and then presented 05/02/17 with urosepsis and shock. Since admission Sharon Gilmore has also developed an ileus and is NPO. Today Sharon Gilmore went into AF with RVR-new for her. When I saw her this evening Sharon Gilmore reported Sharon Gilmore was unaware of going into AF. Sharon Gilmore is now in NSR on IV Diltiazem.   Past Medical History:  Diagnosis Date  . Abnormal barium swallow 09/1999  . Arthritis   . Asthma   . Carpal tunnel syndrome   . Depression   . Erythema nodosum   . H/O Doppler ultrasound    LEFT LEG 11/1997  . Hypertension   . Impaired fasting glucose   . Presence of permanent cardiac pacemaker    MEDTRONIC  . Psoriasis   . Rosacea   . Seizures (HCC) 1980  . Swallowing disorder   . Systolic murmur     Past Surgical History:  Procedure Laterality Date  . BILATERAL CARPAL TUNNEL RELEASE    . CHOLECYSTECTOMY    . COLONOSCOPY  2004  .  INCISIONAL HERNIA REPAIR    . LEFT HEART CATH AND CORONARY ANGIOGRAPHY N/A 03/15/2017   Procedure: Left Heart Cath and Coronary Angiography;  Surgeon: Runell Gess, MD;  Location: [facility] INVASIVE CV LAB;  Service: Cardiovascular;  Laterality: N/A;  . PACEMAKER IMPLANT N/A 03/09/2017   Procedure: Pacemaker Implant;  Surgeon: Hillis Range, MD;  Location: [facility] INVASIVE CV LAB;  Service: Cardiovascular;  Laterality: N/A;  . TONSILLECTOMY       Home Medications:  Prior to Admission medications   Medication Sig Start Date End Date Taking? Authorizing Provider  acetaminophen (TYLENOL) 500 MG tablet Take 1,000 mg by mouth 3 (three) times daily.   Yes [provider]  aspirin EC 81 MG tablet Take 81 mg by mouth at bedtime.    Yes [provider]  atorvastatin (LIPITOR) 20 MG tablet Take 20 mg by mouth at bedtime.    Yes [provider]  Cholecalciferol (VITAMIN D3) 2000 units TABS Take 2,000 Units by mouth daily.    Yes [provider]  cyclobenzaprine (FLEXERIL) 5 MG tablet Take 5 mg by mouth 3 (three) times daily as needed for muscle spasms.   Yes [provider]  enalapril (VASOTEC) 20 MG tablet Take 20 mg by mouth daily.   Yes [provider]  gabapentin (NEURONTIN) 100 MG capsule Take 3 capsules (300 mg  total) by mouth at bedtime. 01/04/17  Yes Levert Feinstein, MD  hydrochlorothiazide (HYDRODIURIL) 25 MG tablet Take 1 tablet (25 mg total) by mouth daily. 03/18/17  Yes Delano Metz, MD  hydroxypropyl methylcellulose / hypromellose (ISOPTO TEARS / GONIOVISC) 2.5 % ophthalmic solution Place 1 drop into both eyes 4 (four) times daily as needed for dry eyes.   Yes [provider]  metoprolol tartrate (LOPRESSOR) 50 MG tablet Take 1 tablet (50 mg total) by mouth 2 (two) times daily. 03/22/17  Yes Sheilah Pigeon, PA-C  Multiple Vitamins-Minerals (PRESERVISION AREDS 2) CAPS Take 1 capsule by mouth 2 (two) times daily.   Yes [provider]    oxyCODONE (OXY IR/ROXICODONE) 5 MG immediate release tablet Take 5 mg by mouth every 4 (four) hours as needed for severe pain.   Yes [provider]  traMADol (ULTRAM) 50 MG tablet Take 1 tablet (50 mg total) by mouth every 6 (six) hours as needed for moderate pain. Patient taking differently: Take 50 mg by mouth 4 (four) times daily.  04/27/17  Yes Maeola Harman, MD  HYDROmorphone (DILAUDID) 2 MG tablet Take 1-2 tablets (2-4 mg total) by mouth every 4 (four) hours as needed for moderate pain or severe pain. Patient not taking: Reported on 05/02/2017 04/27/17   Maeola Harman, MD    Inpatient Medications: Scheduled Meds: . bisacodyl  10 mg Rectal Daily  . chlorhexidine  15 mL Mouth Rinse BID  . chlorhexidine  15 mL Mouth Rinse BID  . gabapentin  300 mg Per Tube QHS  . heparin subcutaneous  5,000 Units Subcutaneous Q8H  . mouth rinse  15 mL Mouth Rinse q12n4p  . mouth rinse  15 mL Mouth Rinse q12n4p   Continuous Infusions: . sodium chloride 100 mL/hr at 05/05/17 1843  . ciprofloxacin Stopped (05/05/17 1037)  . diltiazem (CARDIZEM) infusion 15 mg/hr (05/05/17 1515)  . metronidazole Stopped (05/05/17 1534)   PRN Meds: fentaNYL (SUBLIMAZE) injection  Allergies:    Allergies  Allergen Reactions  . Celebrex [Celecoxib] Other (See Comments)    LEG ACHING   . Codeine Nausea And Vomiting  . Cymbalta [Duloxetine Hcl] Other (See Comments)    Sensitive per MAR  . Doxycycline Swelling and Other (See Comments)    Facial swelling  . Meloxicam Other (See Comments)    TUMMY ACHE, INEFFECTIVE  . Nortriptyline Other (See Comments)    unknown  . Percocet [Oxycodone-Acetaminophen] Nausea And Vomiting  . Penicillins Rash and Other (See Comments)    Has patient had a PCN reaction causing immediate rash, facial/tongue/throat swelling, SOB or lightheadedness with hypotension: No Has patient had a PCN reaction causing severe rash involving mucus membranes or skin necrosis: No Has patient  had a PCN reaction that required hospitalization No Has patient had a PCN reaction occurring within the last 10 years: No If all of the above answers are "NO", then may proceed with Cephalosporin use.    Social History:   Social History   Social History  . Marital status: Widowed    Spouse name: N/A  . Number of children: 7  . Years of education: College   Occupational History  . RETIRED RN    Social History Main Topics  . Smoking status: Former Smoker    Quit date: 1982  . Smokeless tobacco: Never Used  . Alcohol use Yes     Comment: 1-2 times per week  . Drug use: No  . Sexual activity: Not on file   Other Topics  Concern  . Not on file   Social History Narrative   Lives at home alone.   Right-handed.   Two cups caffeine per day.    Family History:    Family History  Problem Relation Age of Onset  . CVA Mother   . Stroke Mother   . Parkinson's disease Father   . Leukemia Brother   . CVA Maternal Grandmother   . Stroke Maternal Grandmother   . Peripheral vascular disease Maternal Grandfather   . Breast cancer Paternal Grandmother   . Diabetes Paternal Grandmother   . CVA Paternal Grandfather   . Stroke Paternal Grandfather   . Pancreatic cancer Paternal Aunt   . Stroke Paternal Aunt   . Lung cancer Paternal Uncle   . Pancreatic cancer Brother   . Coronary artery disease Brother      ROS:  Please see the history of present illness.  ROS  All other ROS reviewed and negative.     Physical Exam/Data:   Vitals:   05/05/17 1500 05/05/17 1515 05/05/17 1530 05/05/17 1540  BP: 120/75 139/63 128/62 (!) 127/59  Pulse: (!) 52 72 78 75  Resp: (!) 29 (!) 27 (!) 29 (!) 29  Temp:    98.2 F (36.8 C)  TempSrc:    Oral  SpO2: 96% 97% 96% 96%  Weight:      Height:        Intake/Output Summary (Last 24 hours) at 05/05/17 1858 Last data filed at 05/05/17 1200  Gross per 24 hour  Intake          2153.75 ml  Output              575 ml  Net          1578.75 ml    Filed Weights   05/02/17 1123 05/03/17 0200  Weight: 179 lb (81.2 kg) 178 lb 5.6 oz (80.9 kg)   Body mass index is 30.61 kg/m.  General:  Elderly female, in bed, NG in place, NAD HEENT: normal Lymph: no adenopathy Neck: no JVD Endocrine:  No thryomegaly Vascular: No carotid bruits; FA pulses 2+ bilaterally without bruits  Cardiac:  normal S1, S2; RRR; 2/6 systolic murmur Lungs:  clear to auscultation bilaterally, no wheezing, rhonchi or rales  Abd: distended, BS diminnished Ext: no edema Musculoskeletal:  No deformities, BUE and BLE strength normal and equal Skin: warm and dry  Neuro:  CNs 2-12 intact, no focal abnormalities noted Psych:  Normal affect   EKG:  The EKG from 05/05/17 11 am was personally reviewed and demonstrates:  AF with RVR, borderline LVH, and anterior TWI Telemetry:  Telemetry was personally reviewed and demonstrates:  NSR now  Relevant CV Studies: Echo 03/13/17- Study Conclusions  - Left ventricle: The cavity size was normal. Wall thickness was   increased in a pattern of mild LVH. Systolic function was normal.   The estimated ejection fraction was in the range of 55% to 60%.   Doppler parameters are consistent with both elevated ventricular   end-diastolic filling pressure and elevated left atrial filling   pressure. - Aortic valve: Valve area (Vmax): 2.59 cm^2. - Mitral valve: Valve area by continuity equation (using LVOT   flow): 2.07 cm^2. - Left atrium: The atrium was mildly dilated. - Atrial septum: No defect or patent foramen ovale was identified. - Pericardium, extracardiac: A trivial pericardial effusion was   identified posterior to the heart.  Laboratory Data:  Chemistry Recent Labs Lab 05/03/17 [phone] 05/04/17  0217 05/05/17 0254  NA 134* 134* 132*  K 3.9 3.1* 3.6  CL 105 108 103  CO2 19* 17* 22  GLUCOSE 109* 126* 108*  BUN 33* 21* 15  CREATININE 1.08* 0.62 0.56  CALCIUM 7.5* 7.8* 7.8*  GFRNONAA 47* >60 >60  GFRAA 55* >60 >60   ANIONGAP Recent Labs Lab 05/03/17 0429 05/04/17 0217 05/05/17 0254  PROT 5.6* 5.2* 5.0*  ALBUMIN 2.1* 1.8* 1.7*  AST 55* 43* 42*  ALT 165* 112* 85*  ALKPHOS 114 97 103  BILITOT 0.9 0.7 0.8   Hematology Recent Labs Lab 05/03/17 1649 05/04/17 0217 05/05/17 0254  WBC 14.2* 18.0* 18.3*  RBC 3.91 3.95 3.72*  HGB 11.2* 11.4* 10.6*  HCT 33.6* 33.6* 31.4*  MCV 85.9 85.1 84.4  MCH 28.6 28.9 28.5  MCHC 33.3 33.9 33.8  RDW 16.7* 16.7* 16.5*  PLT 249 262 257   Cardiac EnzymesNo results for input(s): TROPONINI in the last 168 hours. No results for input(s): TROPIPOC in the last 168 hours.  BNPNo results for input(s): BNP, PROBNP in the last 168 hours.  DDimer No results for input(s): DDIMER in the last 168 hours.  Radiology/Studies:  Ct Abdomen Pelvis Wo Contrast  Result Date: 05/02/2017 CLINICAL DATA:  83 year old female with abdominal pain and distension. Sepsis. EXAM: CT ABDOMEN AND PELVIS WITHOUT CONTRAST TECHNIQUE: Multidetector CT imaging of the abdomen and pelvis was performed following the standard protocol without IV contrast. Intravenous contrast was not administered secondary to acute kidney injury. COMPARISON:  01/14/2017 CT FINDINGS: Please note that parenchymal abnormalities may be missed without intravenous contrast. Lower chest: Mild to moderate bibasilar atelectasis noted. Hepatobiliary: No hepatic abnormalities identified. The patient is status post cholecystectomy. No biliary dilatation. Pancreas: Unremarkable except for atrophy Spleen: Unremarkable Adrenals/Urinary Tract: Small foci of gas within the bladder wall noted likely representing emphysematous cystitis. The kidneys are unremarkable. Mild fullness of the adrenal glands again noted. No evidence of hydronephrosis or obstructing urinary calculi. Stomach/Bowel: Mildly distended fluid and gas-filled colon noted with large amount of gas in the distal sigmoid colon and rectum. Pericolonic inflammation of the  descending and sigmoid colon noted as well of the rectum. No dilated small bowel loops are noted. No evidence of pneumoperitoneum. The appendix is normal. Vascular/Lymphatic: Aortic atherosclerosis. No enlarged abdominal or pelvic lymph nodes. Reproductive: Uterus and bilateral adnexa are unremarkable. Other: No free fluid or definite focal collection. Musculoskeletal: No acute or suspicious bony abnormality. Lumbar spine surgical changes noted. IMPRESSION: 1. Inflammation adjacent to the descending colon, sigmoid colon and rectum -question colitis. Large amount of stool within the distal sigmoid colon and rectum, with the remainder of the colon mildly distended with gas and fluid. 2. Small foci of gas within the bladder wall likely representing emphysematous cystitis. 3. Mild to moderate bibasilar atelectasis 4.  Aortic Atherosclerosis (ICD10-I70.0). Electronically Signed   By: Harmon Pier M.D.   On: 05/02/2017 18:37   Ct Abdomen Pelvis W Contrast  Result Date: 05/04/2017 CLINICAL DATA:  Abdominal pain. CT abdomen 9/16 showing rectosigmoid colitis, emphysematous bladder. KUB on 09/17 showing possible SBO versus ileus. Nasogastric tube in place. Abdominal pain less than yesterday. EXAM: CT ABDOMEN AND PELVIS WITH CONTRAST TECHNIQUE: Multidetector CT imaging of the abdomen and pelvis was performed using the standard protocol following bolus administration of intravenous contrast. CONTRAST:  30mL ISOVUE-300 IOPAMIDOL (ISOVUE-300) INJECTION 61%, ISOVUE-300 IOPAMIDOL (ISOVUE-300) INJECTION 61% COMPARISON:  CT abdomen dated 05/02/2017.  KUB dated 05/04/2017. FINDINGS:  Lower chest: Small bilateral pleural effusions and associated bibasilar atelectasis, both slightly increased compared to the earlier exam. Hepatobiliary: No focal liver abnormality is seen. Status post cholecystectomy. No biliary dilatation. Pancreas: Unremarkable. No pancreatic ductal dilatation or surrounding inflammatory changes. Spleen: Normal  in size without focal abnormality. Adrenals/Urinary Tract: Adrenal glands appear normal. Kidneys are unremarkable without mass, stone or hydronephrosis. No ureteral or bladder calculi identified. Bladder decompressed by Foley catheter. Stomach/Bowel: Persistent thickening of the walls of the rectosigmoid colon. Wall thickening now also noted at the level of the upper/mid descending colon. Again noted is a large amount of stool and gas throughout the affected portions of the colon, and large amount of gas within the transverse colon. Again noted is pericolonic fluid stranding/inflammation which is stable or perhaps slightly increased compared to the previous exam. Stomach is decompressed by nasogastric tube. No significant small bowel dilatation. Vascular/Lymphatic: Aortic atherosclerosis. No enlarged abdominal or pelvic lymph nodes. Reproductive: Uterus and bilateral adnexa are unremarkable. Other: Small amount of free fluid within the abdomen and pelvis. No circumscribed abscess collection identified. No free intraperitoneal air or pneumatosis intestinalis seen. Musculoskeletal: No acute or suspicious osseous finding. Degenerative changes throughout the slightly scoliotic thoracolumbar spine, at least moderate in degree. Status post surgical decompression within the lumbar spine without evidence of surgical complicating feature. Mild anasarca. IMPRESSION: 1. Persistent thickening of the walls of the rectosigmoid colon. Wall thickening is now also noted at the levels of the upper and mid descending colon. Findings are consistent with colitis of infectious or inflammatory nature. Again noted is associated pericolonic fluid stranding/inflammation which is stable or perhaps slightly increased compared to the previous exam. No evidence of bowel perforation, pneumatosis intestinalis or abscess. 2. The affected portions of the colon are again distended with stool and gas, most suggestive of ileus. Asymmetrically prominent  mount of stool within the rectosigmoid colon may indicate a component of constipation/impaction. No significant small bowel dilatation on today's exam. Nasogastric tube appropriately positioned in the decompressed stomach. 3. Bladder decompressed by Foley catheter. No emphysematous cystitis changes appreciated on today's exam, perhaps obscured by the bladder wall decompression. 4. Small amount of free fluid in the abdomen and pelvis. Again, no abscess collection seen. 5. Small bilateral pleural effusions and associated bibasilar atelectasis. 6. Mild anasarca. 7. Aortic atherosclerosis. Electronically Signed   By: Bary Richard M.D.   On: 05/04/2017 16:29   US Renal  Result Date: 05/02/2017 CLINICAL DATA:  Acute kidney injury EXAM: RENAL / URINARY TRACT ULTRASOUND COMPLETE COMPARISON:  01/14/2017 FINDINGS: Right Kidney: Length: 10.0 cm. Normal echogenicity. No hydronephrosis. Small renal cortical cysts in the midpole regions measure 1.5 cm or less in diameter. At least 2 cysts visualized. Left Kidney: Length: 12.4 cm. Echogenicity within normal limits. No mass or hydronephrosis visualized. Bladder: Completely empty.  Limited assess by ultrasound IMPRESSION: Incidental small renal cysts on the right kidney otherwise no acute finding by ultrasound. Negative for hydronephrosis. Electronically Signed   By: Judie Petit.  Shick M.D.   On: 05/02/2017 17:22   Dg Abd Portable 1v  Result Date: 05/04/2017 CLINICAL DATA:  NG tube placement EXAM: PORTABLE ABDOMEN - 1 VIEW COMPARISON:  05/03/2017 FINDINGS: Esophageal tube tip is in the left upper quadrant in the region of gastric fundus. There are multiple dilated loops of small bowel within the central abdomen measuring up to 3.8 cm, concerning for a bowel obstruction. Surgical hardware in the lumbar spine. IMPRESSION: Esophageal tube tip projects over the gastric fundus. Multiple loops of dilated  central small bowel are concerning for bowel obstruction Electronically Signed   By:  Jasmine Pang M.D.   On: 05/04/2017 03:38   Dg Abd Portable 1v  Result Date: 05/03/2017 CLINICAL DATA:  Nasogastric tube placement. EXAM: PORTABLE ABDOMEN - 1 VIEW COMPARISON:  Plain film of the abdomen from earlier same day. FINDINGS: Nasogastric tube is adequately positioned in the stomach with tip directed towards the stomach fundus. Dilated gas-filled small bowel loops are again seen within the central abdomen, incompletely imaged. IMPRESSION: Nasogastric tube appears adequately positioned in the stomach. Persistent evidence of ileus versus small bowel obstruction, incompletely imaged. Electronically Signed   By: Bary Richard M.D.   On: 05/03/2017 20:59   Dg Abd Portable 1v  Result Date: 05/03/2017 CLINICAL DATA:  83 year old female with abdominal pain. Subsequent encounter. EXAM: PORTABLE ABDOMEN - 1 VIEW COMPARISON:  05/02/2017 CT. FINDINGS: Progressive gaseous distention of small bowel and portions of colon which may reflect ileus or partial obstruction secondary to distal colitis. The possibility of free intraperitoneal air cannot be assessed on a supine view. Degenerative changes lumbar spine. IMPRESSION: Progressive gaseous distention of small bowel and portions of colon which may reflect ileus or partial obstruction secondary to distal colitis. The possibility of free intraperitoneal air cannot be assessed on a supine view. Electronically Signed   By: Lacy Duverney M.D.   On: 05/03/2017 18:18    Assessment and Plan:   1. AF with RVR-    New for her, now in NSR on IV Diltiazem  2. High degree heart block s/p PPM 03/09/17    doing well on f/u 04/07/17  3. HTN, with a history of past hypertensive urgency     B/P controlled, mild LVH by echo  4.  Non-obstructive CAD by cath     60% LAD at cath July 2018  5. Urosepsis-per primary service  6. Ileus- per primary service  7. Recent back surgery- Dr Wynetta Emery  Plan: Sharon Gilmore was on 50 mg Lopressor BID prior to admission as well as Vasotec 20  mg for HTN. IV Diltiazem a good choice for now to help control HTN and rate. If Sharon Gilmore has recurrent AF will need to consider systemic anticoagulation and starting IV beta blocker. MD to see. I spoke with Sharon Gilmore and her son. TSH was normal in July, no need to repeat.     For questions or updates, please contact [facility] Please consult [email or web address] for contact info under Cardiology/STEMI.   Jolene Provost, PA-C  05/05/2017 6:58 PM

## 2017-05-05 NOTE — Progress Notes (Signed)
Family Medicine Teaching Service Daily Progress Note Intern Pager: [phone]  Patient name: Sharon Gilmore Medical record number: [id number] Date of birth: 31-Jan-1934 Age: 83 y.o. Gender: female  Primary Care Provider: Olivia Canter, MD Consultants: CCM, Urology, General surgery Code Status: Full  Pt Overview and Major Events to Date:  09/16 - admitted for hypotension, abd pain, urosepsis 09/16 - transfer to ICU for refractive hypotension, shock 09/16 - CT - rectosigmoid colitis 09/17 - KUB possible SBO vs ileus 09/18 - Family Medicine resume care 09/19 - Patient transferred to step down  Assessment and Plan: EMMALENA Gilmore is a 83 y.o. female presenting with hypotension, abdominal pain, UA suggestive of urosepsis. PMH is significant for HTN, lumbar stenosis with recent decompression and fusion 04/2017, AV block s/p pacemaker 02/2017, asthma.   Rectosigmoid Colitis, complicated by ileus: LLQ abd pain. Repeat CT abd/pelvis on 09/18- Colitis. Pericolonic fluid stranding/inflammation which is stable. No evidence of bowel perforation, pneumatosis intestinalis or abscess. Affected portions of the colon are distended with stool and gas, most suggestive of ileus. NGT in place. Abdominal pain less than yesterday. - Ceftaz (9/17-9/18), cipro and flagyl (9/18-) started in setting of colitis- switch to PO when NTG out - NGT until bowels improve - GI consulted - NPO - Changed to NS 50ml/hr for 18 hours - Decreased to fentanyl 12.5 q2h PRN pain, encourage movement - surgery rec appreciated, not surgical at this time - Daily dulcolax suppositories.  - Keep potassium >4, mag >2 surgery repleting - May consider adding reglan  - WBC elevated to 22> 14.2> 18.3 on 9/19 - Patient ok to go to floo  Urosepsis: UA concerning for urosepsis (cloudy, leuks, TNTC WBC, many bacteria, 0-5 squams) in the setting of recent surgery with foley placement and patient with urinary frequency. Renal U/S  showed cysts, no hydro. Now in step down s/p pressors. S/p rocephin (9/16-9/17).  Urine culture grew pansensitive Ecoli. Repeat CT showed bladder decompressed by Foley catheter. No emphysematous cystitis changes appreciated, perhaps obscured by the bladder wall decompression. - D/c foley today - follow blood cultures - NGx2d - CBC  Elevated transaminases: Improving. AST 222> 85 and ALT 64> 42, WNL 03/13/2017. Elevated LFTs likely 2/2 shock liver with hypotension and lactic acidosis.  - Monitor  AKI, resolved: baseline Cr. 0.5. Cr on admit 2.11 in the setting of hypovolemia 2/2 sepsis (dry mucuous membranes on exam). Cause of AKI likely shock 2/2 sepsis vs urinary retention. Renal U/S neg for hydro. Cr 9/19 0.56 - strict I/Os - monitor fluid status - monitor Cr  Hyponatremia:Patient on - 1/2 NS 43ml/hr - Will d/c 1/2 NS and replace with NS at 75 mL/hr for 18 hours - Monitor and consider changing back to 1/2 NS   Hx of spinal surgery: recent spinal surgery with Dr. Venetia Maxon (L2-L5 decompression and fusion) on 04/23/17. Prior to this, lived in independent apartment at [place]. D/c to SNF for rehab. No signs of infection or bleeding over site, patient denies back pain. -monitor on exam  HTN: Normotensive- slightly elevated BP with last BP of 147/70. home meds are enalapril, HCTZ, lopressor.  -hold home meds   Hx of high degree AV block, s/p pacemaker: pacer placed 03/08/17. Will observe blood cultures, if positive, may need to consider echo to rule out vegetations on leads.  -monitor blood cultures -monitor on tele  Asthma: patient reports no inhalers for several years, also denies URIs or hx of pneumonia. -monitor O2 sats on vitals  HLD: home med  is lipitor . Hold in the setting of elevated transaminases.  -hold home lipitor  FEN/GI: NPO  Prophylaxis: SCDs  Disposition: Transfer to floor  Subjective:  Patient reports that she is feeling much better today with less abdominal  pain. Is eager to get up and moving and eat. Patient reports that the benadryl helped her get rest fort he first time since she's been here and would appreciate having it prn if she is still unable to sleep now that she is in step down.   Objective: Temp:  [97.4 F (36.3 C)-98.6 F (37 C)] 98.2 F (36.8 C) (09/19 0415) Pulse Rate:  [92-118] 107 (09/19 0500) Resp:  [19-35] 29 (09/19 0500) BP: (124-152)/(57-127) 136/81 (09/19 0500) SpO2:  [91 %-100 %] 100 % (09/19 0500)  Physical Exam: General: elderly female lying in bed, in mod distress Cardiovascular: RRR, no murmur Respiratory: CTA bilaterally anteriorly Abdomen: less tender to palpation in lower quadrants, hypoactive BS, distended, no rebound or guarding Extremities: trace  BLLE edema  Laboratory:  Recent Labs Lab 05/03/17 1649 05/04/17 0217 05/05/17 0254  WBC 14.2* 18.0* 18.3*  HGB 11.2* 11.4* 10.6*  HCT 33.6* 33.6* 31.4*  PLT 249 262 257    Recent Labs Lab 05/03/17 0429 05/04/17 0217 05/05/17 0254  NA 134* 134* 132*  K 3.9 3.1* 3.6  CL 105 108 103  CO2 19* 17* 22  BUN 33* 21* 15  CREATININE 1.08* 0.62 0.56  CALCIUM 7.5* 7.8* 7.8*  PROT 5.6* 5.2* 5.0*  BILITOT 0.9 0.7 0.8  ALKPHOS 114 97 103  ALT 165* 112* 85*  AST 55* 43* 42*  GLUCOSE 109* 126* 108*    Imaging/Diagnostic Tests:  Ct Abdomen Pelvis W Contrast  Result Date: 05/04/2017 CLINICAL DATA:  Abdominal pain. CT abdomen 9/16 showing rectosigmoid colitis, emphysematous bladder. KUB on 09/17 showing possible SBO versus ileus. Nasogastric tube in place. Abdominal pain less than yesterday. EXAM: CT ABDOMEN AND PELVIS WITH CONTRAST TECHNIQUE: Multidetector CT imaging of the abdomen and pelvis was performed using the standard protocol following bolus administration of intravenous contrast. CONTRAST:  30mL ISOVUE-300 IOPAMIDOL (ISOVUE-300) INJECTION 61%, ISOVUE-300 IOPAMIDOL (ISOVUE-300) INJECTION 61% COMPARISON:  CT abdomen dated 05/02/2017.  KUB dated  05/04/2017. FINDINGS: Lower chest: Small bilateral pleural effusions and associated bibasilar atelectasis, both slightly increased compared to the earlier exam. Hepatobiliary: No focal liver abnormality is seen. Status post cholecystectomy. No biliary dilatation. Pancreas: Unremarkable. No pancreatic ductal dilatation or surrounding inflammatory changes. Spleen: Normal in size without focal abnormality. Adrenals/Urinary Tract: Adrenal glands appear normal. Kidneys are unremarkable without mass, stone or hydronephrosis. No ureteral or bladder calculi identified. Bladder decompressed by Foley catheter. Stomach/Bowel: Persistent thickening of the walls of the rectosigmoid colon. Wall thickening now also noted at the level of the upper/mid descending colon. Again noted is a large amount of stool and gas throughout the affected portions of the colon, and large amount of gas within the transverse colon. Again noted is pericolonic fluid stranding/inflammation which is stable or perhaps slightly increased compared to the previous exam. Stomach is decompressed by nasogastric tube. No significant small bowel dilatation. Vascular/Lymphatic: Aortic atherosclerosis. No enlarged abdominal or pelvic lymph nodes. Reproductive: Uterus and bilateral adnexa are unremarkable. Other: Small amount of free fluid within the abdomen and pelvis. No circumscribed abscess collection identified. No free intraperitoneal air or pneumatosis intestinalis seen. Musculoskeletal: No acute or suspicious osseous finding. Degenerative changes throughout the slightly scoliotic thoracolumbar spine, at least moderate in degree. Status post surgical decompression within the lumbar  spine without evidence of surgical complicating feature. Mild anasarca. IMPRESSION: 1. Persistent thickening of the walls of the rectosigmoid colon. Wall thickening is now also noted at the levels of the upper and mid descending colon. Findings are consistent with colitis of  infectious or inflammatory nature. Again noted is associated pericolonic fluid stranding/inflammation which is stable or perhaps slightly increased compared to the previous exam. No evidence of bowel perforation, pneumatosis intestinalis or abscess. 2. The affected portions of the colon are again distended with stool and gas, most suggestive of ileus. Asymmetrically prominent mount of stool within the rectosigmoid colon may indicate a component of constipation/impaction. No significant small bowel dilatation on today's exam. Nasogastric tube appropriately positioned in the decompressed stomach. 3. Bladder decompressed by Foley catheter. No emphysematous cystitis changes appreciated on today's exam, perhaps obscured by the bladder wall decompression. 4. Small amount of free fluid in the abdomen and pelvis. Again, no abscess collection seen. 5. Small bilateral pleural effusions and associated bibasilar atelectasis. 6. Mild anasarca. 7. Aortic atherosclerosis. Electronically Signed   By: Bary Richard M.D.   On: 05/04/2017 16:29   Dg Abd Portable 1v  Result Date: 05/04/2017 CLINICAL DATA:  NG tube placement EXAM: PORTABLE ABDOMEN - 1 VIEW COMPARISON:  05/03/2017 FINDINGS: Esophageal tube tip is in the left upper quadrant in the region of gastric fundus. There are multiple dilated loops of small bowel within the central abdomen measuring up to 3.8 cm, concerning for a bowel obstruction. Surgical hardware in the lumbar spine. IMPRESSION: Esophageal tube tip projects over the gastric fundus. Multiple loops of dilated central small bowel are concerning for bowel obstruction Electronically Signed   By: Jasmine Pang M.D.   On: 05/04/2017 03:38   Dg Abd Portable 1v  Result Date: 05/03/2017 CLINICAL DATA:  Nasogastric tube placement. EXAM: PORTABLE ABDOMEN - 1 VIEW COMPARISON:  Plain film of the abdomen from earlier same day. FINDINGS: Nasogastric tube is adequately positioned in the stomach with tip directed towards  the stomach fundus. Dilated gas-filled small bowel loops are again seen within the central abdomen, incompletely imaged. IMPRESSION: Nasogastric tube appears adequately positioned in the stomach. Persistent evidence of ileus versus small bowel obstruction, incompletely imaged. Electronically Signed   By: Bary Richard M.D.   On: 05/03/2017 20:59   Dg Abd Portable 1v  Result Date: 05/03/2017 CLINICAL DATA:  83 year old female with abdominal pain. Subsequent encounter. EXAM: PORTABLE ABDOMEN - 1 VIEW COMPARISON:  05/02/2017 CT. FINDINGS: Progressive gaseous distention of small bowel and portions of colon which may reflect ileus or partial obstruction secondary to distal colitis. The possibility of free intraperitoneal air cannot be assessed on a supine view. Degenerative changes lumbar spine. IMPRESSION: Progressive gaseous distention of small bowel and portions of colon which may reflect ileus or partial obstruction secondary to distal colitis. The possibility of free intraperitoneal air cannot be assessed on a supine view. Electronically Signed   By: Lacy Duverney M.D.   On: 05/03/2017 18:18     Areeb Corron, [place], DO 05/05/2017, 6:52 AM PGY-1, [facility] FPTS Intern pager: [phone], text pages welcome

## 2017-05-05 NOTE — Progress Notes (Signed)
Bladder scanned for greater than 130. Pt states still can not void, not feeling urger. Paged md on call, will continue to monitor for now

## 2017-05-05 NOTE — Progress Notes (Signed)
   05/05/17 1132  Vitals  BP (!) 148/73  MAP (mmHg) 88  Pulse Rate (!) 127  ECG Heart Rate (!) 129  Resp (!) 29  Oxygen Therapy  SpO2 97 %  cardizem gtt started

## 2017-05-05 NOTE — Progress Notes (Signed)
Refused tap water enema at this time. Will notify md

## 2017-05-05 NOTE — Progress Notes (Signed)
   05/05/17 1156  Vitals  Pulse Rate (!) 150  ECG Heart Rate (!) 151  Resp (!) 29  Oxygen Therapy  SpO2 96 %  O2 Device Nasal Cannula  O2 Flow Rate (L/min) 4 L/min  pt short of breath increased to 4l/min, pt slightly more confused than this am.

## 2017-05-05 NOTE — Progress Notes (Signed)
Physical Therapy Treatment Patient Details Name: Sharon Gilmore MRN: [id number] DOB: [birth year] Today's Date: 05/05/2017    History of Present Illness Patient is a 83 y/o female with recent decompression and fusion L2-5 on 9/7 presents from rehab with abdominal pain, nausea, weakness. Admitted with urosepsis. PMH includes deperssion, erythema nodosum, pacemaker, swallowing disorder.  Past Medical History:  Diagnosis Date  . Abnormal barium swallow 09/1999  . Arthritis   . Asthma   . Carpal tunnel syndrome   . Depression   . Erythema nodosum   . H/O Doppler ultrasound    LEFT LEG 11/1997  . Hypertension   . Impaired fasting glucose   . Presence of permanent cardiac pacemaker    [employer]ONIC  . Psoriasis   . Rosacea   . Seizures (HCC) 1980  . Swallowing disorder   . Systolic murmur       PT Comments    Pt admitted with above diagnosis. Pt currently with functional limitations due to balance and endurance deficits. Daughter in law bringing brace per pt but she states she can still sit up with out it. Pt was able to transfer to chair with +2 min to mod assist for stability.  Will continue acute PT as pt tolerates.  Pt will benefit from skilled PT to increase their independence and safety with mobility to allow discharge to the venue listed below.     Follow Up Recommendations  SNF     Equipment Recommendations  None recommended by PT    Recommendations for Other Services OT consult     Precautions / Restrictions Precautions Precautions: Back;Fall (NG tube, rectal pouch in place) Precaution Booklet Issued: No Precaution Comments: Able to recall 2/3 precautions; NG tube to suction Required Braces or Orthoses: Spinal Brace Spinal Brace: Lumbar corset (daughter-in-law  is bringing the brace today) Restrictions Weight Bearing Restrictions: No Other Position/Activity Restrictions: pacemaker ~1 month ago    Mobility  Bed Mobility Overal bed mobility: Needs Assistance Bed  Mobility: Rolling Rolling: Min assist Sidelying to sit: Min assist       General bed mobility comments: Pt needed min assist for LES and for elevation of trunk.  Needed assist to scoot out and steayding assist at EOB.  Trying to not scoot without lifting up due to rectal pouch.   Transfers Overall transfer level: Needs assistance Equipment used: Rolling walker (2 wheeled) Transfers: Sit to/from [employer] Sit to Stand: Mod assist;+2 safety/equipment         General transfer comment: Needed mod assist to power up and +2 mod assit for stand pivot transfer with bil knee instabiity. Needed RW for stabiity as well.  Very poor endurance needing controlled descent into chair.   Ambulation/Gait                 Stairs            Wheelchair Mobility    Modified Rankin (Stroke Patients Only)       Balance Overall balance assessment: Needs assistance Sitting-balance support: Feet supported;Bilateral upper extremity supported Sitting balance-Leahy Scale: Poor Sitting balance - Comments: Needed assist to steady pt at EOB.   Standing balance support: Bilateral upper extremity supported;During functional activity Standing balance-Leahy Scale: Poor Standing balance comment: heavy reliance on UES for support                            Cognition Arousal/Alertness: Awake/alert Behavior During Therapy: Flat affect Overall Cognitive  Status: Within Functional Limits for tasks assessed Area of Impairment: Memory;Problem solving                     Memory: Decreased short-term memory       Problem Solving: Slow processing General Comments: Cognition fluctuating throughout session; in/out of confusion. A&Ox4. Stating things that are unrelated to questions asked at times.       Exercises      General Comments General comments (skin integrity, edema, etc.): VSS with treatment       Pertinent Vitals/Pain Pain Assessment: Faces Faces  Pain Scale: Hurts little more Pain Location: abdomen Pain Descriptors / Indicators: Sore;Aching;Pressure Pain Intervention(s): Limited activity within patient's tolerance;Monitored during session;Repositioned    Home Living                      Prior Function            PT Goals (current goals can now be found in the care plan section) Progress towards PT goals: Progressing toward goals    Frequency    Min 3X/week      PT Plan Current plan remains appropriate    Co-evaluation              AM-PAC PT "6 Clicks" Daily Activity  Outcome Measure  Difficulty turning over in bed (including adjusting bedclothes, sheets and blankets)?: Unable Difficulty moving from lying on back to sitting on the side of the bed? : Unable Difficulty sitting down on and standing up from a chair with arms (e.g., wheelchair, bedside commode, etc,.)?: A Lot Help needed moving to and from a bed to chair (including a wheelchair)?: A Lot Help needed walking in hospital room?: Total Help needed climbing 3-5 steps with a railing? : Total 6 Click Score: 8    End of Session Equipment Utilized During Treatment: Gait belt;Oxygen Activity Tolerance: Patient limited by fatigue Patient left: with call bell/phone within reach;in chair Nurse Communication: Mobility status PT Visit Diagnosis: Pain;Other abnormalities of gait and mobility (R26.89);Muscle weakness (generalized) (M62.81) Pain - part of body:  (abdomen)     Time: 1610-9604 PT Time Calculation (min) (ACUTE ONLY): 13 min  Charges:  $Therapeutic Activity: 8-22 mins                    G Codes:       Brittain Hosie,PT Acute Rehabilitation [phone] (pager)    Berline Lopes 05/05/2017, 10:45 AM

## 2017-05-05 NOTE — Progress Notes (Signed)
since no sensation to void,  Bladder scanned for 109cc and 120cc.  Paged MD, Waiting on orders.

## 2017-05-05 NOTE — Progress Notes (Signed)
FPTS Interim Progress Note  S: Called due to concerns regarding patient's respiratory status. Patient easily awoken and says she feels better. Denies chest pain, SOB. Says she just needs to get some rest.   O: BP 134/69   Pulse (!) 107   Temp 98.6 F (37 C) (Oral)   Resp (!) 27   Ht  (1.626 m)   Wt 178 lb 5.6 oz (80.9 kg)   SpO2 98%   BMI 30.61 kg/m   Gen: sleeping, easily awoken, pleasant, NAD Card: RRR, no mrg Resp: Normal work of breathing, on 4L [place], minimal wheeze noted on exam, RR 20 while at bedside  A/P: Will continue to monitor respiratory status.  Twanna Resh, [place], DO 05/05/2017, 3:48 AM PGY-1, [facility] Service pager [phone]

## 2017-05-05 NOTE — Progress Notes (Signed)
Patient ID: Sharon Gilmore, female   DOB: Aug 20, 1933, 83 y.o.   MRN: [id number]  [facility] Surgery Progress Note     Subjective: CC- ileus Lying in bed, son at bedside. Overall feeling a little better today. She reports less abdominal pain. Had a BM with suppository yesterday. No flatus. Denies n/v. Abdominal CT repeated yesterday and showed persistent ileus and possible constipation/impaction.  PT recommending SNF.  Objective: Vital signs in last 24 hours: Temp:  [97.4 F (36.3 C)-98.6 F (37 C)] 98.2 F (36.8 C) (09/19 0415) Pulse Rate:  [92-118] 107 (09/19 0500) Resp:  [19-35] 29 (09/19 0500) BP: (124-152)/(57-127) 136/81 (09/19 0500) SpO2:  [91 %-100 %] 100 % (09/19 0500) Last BM Date: 05/04/17  Intake/Output from previous day: 09/18 0701 - 09/19 0700 In: 2476.3 [I.V.:1576.3; NG/GT:50; IV Piggyback:850] Out: 1105 [Urine:1005; Emesis/NG output:100] Intake/Output this shift: No intake/output data recorded.  PE: Gen:  Alert, NAD, pleasant HEENT: EOM's intact, pupils equal and round Card:  RRR, no M/G/R heard Pulm:  CTAB, no W/R/R, effort normal Abd: Soft, mild distension, few BS, mild suprapubic tenderness Ext:  No erythema, edema, or tenderness BUE/BLE  Psych: A&Ox3  Skin: no rashes noted, warm and dry  Lab Results:   Recent Labs  05/04/17 0217 05/05/17 0254  WBC 18.0* 18.3*  HGB 11.4* 10.6*  HCT 33.6* 31.4*  PLT 262 257   BMET  Recent Labs  05/04/17 0217 05/05/17 0254  NA 134* 132*  K 3.1* 3.6  CL 108 103  CO2 17* 22  GLUCOSE 126* 108*  BUN 21* 15  CREATININE 0.62 0.56  CALCIUM 7.8* 7.8*   PT/INR  Recent Labs  05/02/17 2148  LABPROT 18.2*  INR 1.52   CMP     Component Value Date/Time   NA 132 (L) 05/05/2017 0254   NA 139 01/20/2017 1554   K 3.6 05/05/2017 0254   CL 103 05/05/2017 0254   CO2 22 05/05/2017 0254   GLUCOSE 108 (H) 05/05/2017 0254   BUN 15 05/05/2017 0254   BUN 13 01/20/2017 1554   CREATININE 0.56  05/05/2017 0254   CALCIUM 7.8 (L) 05/05/2017 0254   PROT 5.0 (L) 05/05/2017 0254   PROT 6.7 01/20/2017 1554   ALBUMIN 1.7 (L) 05/05/2017 0254   ALBUMIN 3.8 01/20/2017 1554   AST 42 (H) 05/05/2017 0254   ALT 85 (H) 05/05/2017 0254   ALKPHOS 103 05/05/2017 0254   BILITOT 0.8 05/05/2017 0254   BILITOT 0.2 01/20/2017 1554   GFRNONAA >60 05/05/2017 0254   GFRAA >60 05/05/2017 0254   Lipase     Component Value Date/Time   LIPASE 20 05/03/2017 1649       Studies/Results: Ct Abdomen Pelvis W Contrast  Result Date: 05/04/2017 CLINICAL DATA:  Abdominal pain. CT abdomen 9/16 showing rectosigmoid colitis, emphysematous bladder. KUB on 09/17 showing possible SBO versus ileus. Nasogastric tube in place. Abdominal pain less than yesterday. EXAM: CT ABDOMEN AND PELVIS WITH CONTRAST TECHNIQUE: Multidetector CT imaging of the abdomen and pelvis was performed using the standard protocol following bolus administration of intravenous contrast. CONTRAST:  30mL ISOVUE-300 IOPAMIDOL (ISOVUE-300) INJECTION 61%, ISOVUE-300 IOPAMIDOL (ISOVUE-300) INJECTION 61% COMPARISON:  CT abdomen dated 05/02/2017.  KUB dated 05/04/2017. FINDINGS: Lower chest: Small bilateral pleural effusions and associated bibasilar atelectasis, both slightly increased compared to the earlier exam. Hepatobiliary: No focal liver abnormality is seen. Status post cholecystectomy. No biliary dilatation. Pancreas: Unremarkable. No pancreatic ductal dilatation or surrounding inflammatory changes. Spleen: Normal in size without focal  abnormality. Adrenals/Urinary Tract: Adrenal glands appear normal. Kidneys are unremarkable without mass, stone or hydronephrosis. No ureteral or bladder calculi identified. Bladder decompressed by Foley catheter. Stomach/Bowel: Persistent thickening of the walls of the rectosigmoid colon. Wall thickening now also noted at the level of the upper/mid descending colon. Again noted is a large amount of stool and gas  throughout the affected portions of the colon, and large amount of gas within the transverse colon. Again noted is pericolonic fluid stranding/inflammation which is stable or perhaps slightly increased compared to the previous exam. Stomach is decompressed by nasogastric tube. No significant small bowel dilatation. Vascular/Lymphatic: Aortic atherosclerosis. No enlarged abdominal or pelvic lymph nodes. Reproductive: Uterus and bilateral adnexa are unremarkable. Other: Small amount of free fluid within the abdomen and pelvis. No circumscribed abscess collection identified. No free intraperitoneal air or pneumatosis intestinalis seen. Musculoskeletal: No acute or suspicious osseous finding. Degenerative changes throughout the slightly scoliotic thoracolumbar spine, at least moderate in degree. Status post surgical decompression within the lumbar spine without evidence of surgical complicating feature. Mild anasarca. IMPRESSION: 1. Persistent thickening of the walls of the rectosigmoid colon. Wall thickening is now also noted at the levels of the upper and mid descending colon. Findings are consistent with colitis of infectious or inflammatory nature. Again noted is associated pericolonic fluid stranding/inflammation which is stable or perhaps slightly increased compared to the previous exam. No evidence of bowel perforation, pneumatosis intestinalis or abscess. 2. The affected portions of the colon are again distended with stool and gas, most suggestive of ileus. Asymmetrically prominent mount of stool within the rectosigmoid colon may indicate a component of constipation/impaction. No significant small bowel dilatation on today's exam. Nasogastric tube appropriately positioned in the decompressed stomach. 3. Bladder decompressed by Foley catheter. No emphysematous cystitis changes appreciated on today's exam, perhaps obscured by the bladder wall decompression. 4. Small amount of free fluid in the abdomen and pelvis.  Again, no abscess collection seen. 5. Small bilateral pleural effusions and associated bibasilar atelectasis. 6. Mild anasarca. 7. Aortic atherosclerosis. Electronically Signed   By: Bary Richard M.D.   On: 05/04/2017 16:29   Dg Abd Portable 1v  Result Date: 05/04/2017 CLINICAL DATA:  NG tube placement EXAM: PORTABLE ABDOMEN - 1 VIEW COMPARISON:  05/03/2017 FINDINGS: Esophageal tube tip is in the left upper quadrant in the region of gastric fundus. There are multiple dilated loops of small bowel within the central abdomen measuring up to 3.8 cm, concerning for a bowel obstruction. Surgical hardware in the lumbar spine. IMPRESSION: Esophageal tube tip projects over the gastric fundus. Multiple loops of dilated central small bowel are concerning for bowel obstruction Electronically Signed   By: Jasmine Pang M.D.   On: 05/04/2017 03:38   Dg Abd Portable 1v  Result Date: 05/03/2017 CLINICAL DATA:  Nasogastric tube placement. EXAM: PORTABLE ABDOMEN - 1 VIEW COMPARISON:  Plain film of the abdomen from earlier same day. FINDINGS: Nasogastric tube is adequately positioned in the stomach with tip directed towards the stomach fundus. Dilated gas-filled small bowel loops are again seen within the central abdomen, incompletely imaged. IMPRESSION: Nasogastric tube appears adequately positioned in the stomach. Persistent evidence of ileus versus small bowel obstruction, incompletely imaged. Electronically Signed   By: Bary Richard M.D.   On: 05/03/2017 20:59   Dg Abd Portable 1v  Result Date: 05/03/2017 CLINICAL DATA:  83 year old female with abdominal pain. Subsequent encounter. EXAM: PORTABLE ABDOMEN - 1 VIEW COMPARISON:  05/02/2017 CT. FINDINGS: Progressive gaseous distention of small bowel  and portions of colon which may reflect ileus or partial obstruction secondary to distal colitis. The possibility of free intraperitoneal air cannot be assessed on a supine view. Degenerative changes lumbar spine. IMPRESSION:  Progressive gaseous distention of small bowel and portions of colon which may reflect ileus or partial obstruction secondary to distal colitis. The possibility of free intraperitoneal air cannot be assessed on a supine view. Electronically Signed   By: Lacy Duverney M.D.   On: 05/03/2017 18:18    Anti-infectives: Anti-infectives    Start     Dose/Rate Route Frequency Ordered Stop   05/04/17 2100  metroNIDAZOLE (FLAGYL) IVPB 500 mg     500 mg 100 mL/hr over 60 Minutes Intravenous Every 6 hours 05/04/17 2019     05/04/17 2100  ciprofloxacin (CIPRO) IVPB 400 mg     400 mg 200 mL/hr over 60 Minutes Intravenous Every 12 hours 05/04/17 2019     05/03/17 1500  cefTRIAXone (ROCEPHIN) 2 g in dextrose 5 % 50 mL IVPB  Status:  Discontinued     2 g 100 mL/hr over 30 Minutes Intravenous Every 24 hours 05/02/17 1438 05/03/17 1004   05/03/17 1030  cefTAZidime (FORTAZ) 1 g in dextrose 5 % 50 mL IVPB  Status:  Discontinued     1 g 100 mL/hr over 30 Minutes Intravenous Every 12 hours 05/03/17 1004 05/04/17 2019   05/02/17 1345  cefTRIAXone (ROCEPHIN) 1 g in dextrose 5 % 50 mL IVPB     1 g 100 mL/hr over 30 Minutes Intravenous  Once 05/02/17 1331 05/02/17 1450   05/02/17 1245  cefTRIAXone (ROCEPHIN) 1 g in dextrose 5 % 50 mL IVPB     1 g 100 mL/hr over 30 Minutes Intravenous  Once 05/02/17 1239 05/02/17 1357       Assessment/Plan HTN H/o AV block s/p Pacemaker Asthma HLD  Emphasematous cystitis- IV ABX per primary/urology. Ucx growing E coli Rectosigmoid colitis- secondary inflammation due to above, no C diff done as stools are formed. No surgery needed from our standpoint.  Ileus  - 2/2 to above - CT yesterday showed persistent ileus, no signs of SBO - continue NG tube until return in bowel function. Schedule daily dulcolax suppositories. Encourage OOB. Keep potassium >4 (will give 2 more runs of K today). Limit narcotics. May consider adding reglan if no improvement.  ID - Cipro 9/18>>,  Flagyl 9/18>>, Elita Quick 9/17>>9/18, Rocephin 9/16>>9/17 FEN - IVF, NPO/NGT VTE - SCDs, heparin Follow up - TBD    LOS: 3 days    Franne Forts , [facility] Surgery 05/05/2017, 7:28 AM Pager: [phone] Consults: [phone] Mon-Fri 7:00 am-4:30 pm Sat-Sun 7:00 am-11:30 am

## 2017-05-05 NOTE — Clinical Social Work Note (Signed)
Clinical Social Work Assessment  Patient Details  Name: Sharon Gilmore MRN: [id number] Date of Birth: 01-Nov-1933  Date of referral:  05/05/17               Reason for consult:  Discharge Planning                Permission sought to share information with:  Chartered certified accountant granted to share information::  Yes, Verbal Permission Granted  Name::        Agency::  [facility]  Relationship::     Contact Information:     Housing/Transportation Living arrangements for the past 2 months:  [place], [place] II of Information:  Patient, Medical Team, Other (Comment Required), Facility (Daughters-in-law) Patient Interpreter Needed:  None Criminal Activity/Legal Involvement Pertinent to Current Situation/Hospitalization:  No - Comment as needed Significant Relationships:  Adult Children, Other Family Members Lives with:  Facility Resident Do you feel safe going back to the place where you live?  Yes Need for family participation in patient care:  Yes (Comment)  Care giving concerns:  Patient admitted from [facility] SN[facility]. PT recommending SNF once medically stable for discharge.   Social Worker assessment / plan:  CSW met with patient. Two daughters-in-law at bedside. CSW introduced role and explained that discharge planning would be discussed. Patient confirmed she is from [place] and plans to return once discharged. Per notes from previous admission, patient is a resident on the independent living side but went to SNF side for rehab. Will wait to do FL2 when NG tube is out as this is a barrier to discharge. No further concerns. CSW encouraged patient and her family to contact CSW as needed. CSW will continue to follow patient and her family for support and facilitate discharge to SNF once medically stable.  Employment status:  Retired Forensic scientist:  Medicare PT Recommendations:  [place] / Referral to community resources:  [place]  Patient/Family's Response to care:  Patient agreeable to return to SNF. Patient's family supportive and involved in patient's care. Patient and her daughters-in-law appreciated social work intervention.  Patient/Family's Understanding of and Emotional Response to Diagnosis, Current Treatment, and Prognosis:  Patient and her daughters-in-law have a good understanding of the reason for admission and her need for continued rehab prior to returning to independent living. Patient and her daughters-in-law appear happy with hospital care.  Emotional Assessment Appearance:  Appears stated age Attitude/Demeanor/Rapport:  Other, Lethargic (Pleasant) Affect (typically observed):  Accepting, Appropriate, Calm, Pleasant Orientation:  Oriented to Self, Oriented to Place, Oriented to  Time, Oriented to Situation Alcohol / Substance use:  Never Used Psych involvement (Current and /or in the community):  No (Comment)  Discharge Needs  Concerns to be addressed:  Care Coordination Readmission within the last 30 days:  Yes Current discharge risk:  Dependent with Mobility Barriers to Discharge:  Continued Medical Work up, Other (NG tube)   Candie Chroman, LCSW 05/05/2017, 1:12 PM

## 2017-05-05 NOTE — Consult Note (Signed)
Referring Provider: Dr. Leveda Anna Primary Care Physician:  Olivia Canter, MD Primary Gastroenterologist:  Gentry Fitz  Reason for Consultation:  Abdominal pain; Colitis  HPI: Sharon Gilmore is a 83 y.o. female admitted for urosepsis being seen for a consultation due to abdominal pain and rectosigmoid colitis seen on CT scan. Question ileus on KUB. CT showed rectosigmoid and descending colon thickening. No perforation or abscess. She denies any BMs since admit and denies rectal bleeding. Has been on Ceftaz and Cipro/Flagyl. LFTs have improved to AST 42 (64), ALT 85 (272). She was weaned off pressors and was transferred from ICU to step down unit today.  Past Medical History:  Diagnosis Date  . Abnormal barium swallow 09/1999  . Arthritis   . Asthma   . Carpal tunnel syndrome   . Depression   . Erythema nodosum   . H/O Doppler ultrasound    LEFT LEG 11/1997  . Hypertension   . Impaired fasting glucose   . Presence of permanent cardiac pacemaker    [employer]ONIC  . Psoriasis   . Rosacea   . Seizures (HCC) 1980  . Swallowing disorder   . Systolic murmur     Past Surgical History:  Procedure Laterality Date  . BILATERAL CARPAL TUNNEL RELEASE    . CHOLECYSTECTOMY    . COLONOSCOPY  2004  . INCISIONAL HERNIA REPAIR    . LEFT HEART CATH AND CORONARY ANGIOGRAPHY N/A 03/15/2017   Procedure: Left Heart Cath and Coronary Angiography;  Surgeon: Runell Gess, MD;  Location: [facility] INVASIVE CV LAB;  Service: Cardiovascular;  Laterality: N/A;  . PACEMAKER IMPLANT N/A 03/09/2017   Procedure: Pacemaker Implant;  Surgeon: Hillis Range, MD;  Location: [facility] INVASIVE CV LAB;  Service: Cardiovascular;  Laterality: N/A;  . TONSILLECTOMY      Prior to Admission medications   Medication Sig Start Date End Date Taking? Authorizing Provider  acetaminophen (TYLENOL) 500 MG tablet Take 1,000 mg by mouth 3 (three) times daily.   Yes [provider]  aspirin EC 81 MG tablet Take 81 mg by mouth at  bedtime.    Yes [provider]  atorvastatin (LIPITOR) 20 MG tablet Take 20 mg by mouth at bedtime.    Yes [provider]  Cholecalciferol (VITAMIN D3) 2000 units TABS Take 2,000 Units by mouth daily.    Yes [provider]  cyclobenzaprine (FLEXERIL) 5 MG tablet Take 5 mg by mouth 3 (three) times daily as needed for muscle spasms.   Yes [provider]  enalapril (VASOTEC) 20 MG tablet Take 20 mg by mouth daily.   Yes [provider]  gabapentin (NEURONTIN) 100 MG capsule Take 3 capsules (300 mg total) by mouth at bedtime. 01/04/17  Yes Levert Feinstein, MD  hydrochlorothiazide (HYDRODIURIL) 25 MG tablet Take 1 tablet (25 mg total) by mouth daily. 03/18/17  Yes Delano Metz, MD  hydroxypropyl methylcellulose / hypromellose (ISOPTO TEARS / GONIOVISC) 2.5 % ophthalmic solution Place 1 drop into both eyes 4 (four) times daily as needed for dry eyes.   Yes [provider]  metoprolol tartrate (LOPRESSOR) 50 MG tablet Take 1 tablet (50 mg total) by mouth 2 (two) times daily. 03/22/17  Yes Sheilah Pigeon, PA-C  Multiple Vitamins-Minerals (PRESERVISION AREDS 2) CAPS Take 1 capsule by mouth 2 (two) times daily.   Yes [provider]  oxyCODONE (OXY IR/ROXICODONE) 5 MG immediate release tablet Take 5 mg by mouth every 4 (four) hours as needed for severe pain.   Yes [provider]  traMADol (ULTRAM) 50 MG tablet Take 1 tablet (50 mg total) by mouth every 6 (six) hours as needed for moderate pain. Patient taking differently: Take 50 mg by mouth 4 (four) times daily.  04/27/17  Yes Maeola Harman, MD  HYDROmorphone (DILAUDID) 2 MG tablet Take 1-2 tablets (2-4 mg total) by mouth every 4 (four) hours as needed for moderate pain or severe pain. Patient not taking: Reported on 05/02/2017 04/27/17   Maeola Harman, MD    Scheduled Meds: . bisacodyl  10 mg Rectal Daily  . chlorhexidine  15 mL Mouth Rinse BID  . chlorhexidine  15 mL Mouth Rinse BID   . gabapentin  300 mg Per Tube QHS  . heparin subcutaneous  5,000 Units Subcutaneous Q8H  . mouth rinse  15 mL Mouth Rinse q12n4p  . mouth rinse  15 mL Mouth Rinse q12n4p   Continuous Infusions: . sodium chloride 75 mL/hr at 05/05/17 0500  . ciprofloxacin Stopped (05/04/17 2150)  . metronidazole Stopped (05/05/17 0425)  . potassium chloride 10 mEq (05/05/17 0822)   PRN Meds:.fentaNYL (SUBLIMAZE) injection, metoprolol tartrate  Allergies as of 05/02/2017 - Review Complete 05/02/2017  Allergen Reaction Noted  . Celebrex [celecoxib] Other (See Comments) 06/02/2016  . Codeine Nausea And Vomiting 06/02/2016  . Cymbalta [duloxetine hcl] Other (See Comments) 05/02/2017  . Doxycycline Swelling and Other (See Comments) 04/12/2017  . Meloxicam Other (See Comments) 06/02/2016  . Nortriptyline Other (See Comments) 06/02/2016  . Percocet [oxycodone-acetaminophen] Nausea And Vomiting 06/02/2016  . Penicillins Rash and Other (See Comments) 06/02/2016    Family History  Problem Relation Age of Onset  . CVA Mother   . Stroke Mother   . Parkinson's disease Father   . Leukemia Brother   . CVA Maternal Grandmother   . Stroke Maternal Grandmother   . Peripheral vascular disease Maternal Grandfather   . Breast cancer Paternal Grandmother   . Diabetes Paternal Grandmother   . CVA Paternal Grandfather   . Stroke Paternal Grandfather   . Pancreatic cancer Paternal Aunt   . Stroke Paternal Aunt   . Lung cancer Paternal Uncle   . Pancreatic cancer Brother   . Coronary artery disease Brother     Social History   Social History  . Marital status: Widowed    Spouse name: N/A  . Number of children: 7  . Years of education: College   Occupational History  . RETIRED RN    Social History Main Topics  . Smoking status: Former Smoker    Quit date: 1982  . Smokeless tobacco: Never Used  . Alcohol use Yes     Comment: 1-2 times per week  . Drug use: No  . Sexual activity: Not on file    Other Topics Concern  . Not on file   Social History Narrative   Lives at home alone.   Right-handed.   Two cups caffeine per day.    Review of Systems: All negative except as stated above in HPI.  Physical Exam: Vital signs: Vitals:   05/05/17 0500 05/05/17 0722  BP: 136/81 (!) 147/70  Pulse: (!) 107 (!) 111  Resp: (!) 29 (!) 29  Temp:  97.7 F (36.5 C)  SpO2: 100% 100%   Last BM Date: 05/04/17 General:  Elderly, frail, mild acute distress, thin Head: normocephalic, atraumatic Eyes: anicteric sclera ENT: oropharynx clear, dry mouth Neck: supple, nontender Lungs:  Coarse breath sounds Heart:  Regular rate and rhythm; no murmurs, clicks, rubs,  or  gallops. Abdomen: diffuse tenderness with guarding (greatest in LLQ), mild distention, +BS  Rectal:  Deferred Ext: no edema  GI:  Lab Results:  Recent Labs  05/03/17 1649 05/04/17 0217 05/05/17 0254  WBC 14.2* 18.0* 18.3*  HGB 11.2* 11.4* 10.6*  HCT 33.6* 33.6* 31.4*  PLT 249 262 257   BMET  Recent Labs  05/03/17 0429 05/04/17 0217 05/05/17 0254  NA 134* 134* 132*  K 3.9 3.1* 3.6  CL 105 108 103  CO2 19* 17* 22  GLUCOSE 109* 126* 108*  BUN 33* 21* 15  CREATININE 1.08* 0.62 0.56  CALCIUM 7.5* 7.8* 7.8*   LFT  Recent Labs  05/05/17 0254  PROT 5.0*  ALBUMIN 1.7*  AST 42*  ALT 85*  ALKPHOS 103  BILITOT 0.8   PT/INR  Recent Labs  05/02/17 2148  LABPROT 18.2*  INR 1.52     Studies/Results: Ct Abdomen Pelvis W Contrast  Result Date: 05/04/2017 CLINICAL DATA:  Abdominal pain. CT abdomen 9/16 showing rectosigmoid colitis, emphysematous bladder. KUB on 09/17 showing possible SBO versus ileus. Nasogastric tube in place. Abdominal pain less than yesterday. EXAM: CT ABDOMEN AND PELVIS WITH CONTRAST TECHNIQUE: Multidetector CT imaging of the abdomen and pelvis was performed using the standard protocol following bolus administration of intravenous contrast. CONTRAST:  30mL ISOVUE-300 IOPAMIDOL  (ISOVUE-300) INJECTION 61%, ISOVUE-300 IOPAMIDOL (ISOVUE-300) INJECTION 61% COMPARISON:  CT abdomen dated 05/02/2017.  KUB dated 05/04/2017. FINDINGS: Lower chest: Small bilateral pleural effusions and associated bibasilar atelectasis, both slightly increased compared to the earlier exam. Hepatobiliary: No focal liver abnormality is seen. Status post cholecystectomy. No biliary dilatation. Pancreas: Unremarkable. No pancreatic ductal dilatation or surrounding inflammatory changes. Spleen: Normal in size without focal abnormality. Adrenals/Urinary Tract: Adrenal glands appear normal. Kidneys are unremarkable without mass, stone or hydronephrosis. No ureteral or bladder calculi identified. Bladder decompressed by Foley catheter. Stomach/Bowel: Persistent thickening of the walls of the rectosigmoid colon. Wall thickening now also noted at the level of the upper/mid descending colon. Again noted is a large amount of stool and gas throughout the affected portions of the colon, and large amount of gas within the transverse colon. Again noted is pericolonic fluid stranding/inflammation which is stable or perhaps slightly increased compared to the previous exam. Stomach is decompressed by nasogastric tube. No significant small bowel dilatation. Vascular/Lymphatic: Aortic atherosclerosis. No enlarged abdominal or pelvic lymph nodes. Reproductive: Uterus and bilateral adnexa are unremarkable. Other: Small amount of free fluid within the abdomen and pelvis. No circumscribed abscess collection identified. No free intraperitoneal air or pneumatosis intestinalis seen. Musculoskeletal: No acute or suspicious osseous finding. Degenerative changes throughout the slightly scoliotic thoracolumbar spine, at least moderate in degree. Status post surgical decompression within the lumbar spine without evidence of surgical complicating feature. Mild anasarca. IMPRESSION: 1. Persistent thickening of the walls of the rectosigmoid  colon. Wall thickening is now also noted at the levels of the upper and mid descending colon. Findings are consistent with colitis of infectious or inflammatory nature. Again noted is associated pericolonic fluid stranding/inflammation which is stable or perhaps slightly increased compared to the previous exam. No evidence of bowel perforation, pneumatosis intestinalis or abscess. 2. The affected portions of the colon are again distended with stool and gas, most suggestive of ileus. Asymmetrically prominent mount of stool within the rectosigmoid colon may indicate a component of constipation/impaction. No significant small bowel dilatation on today's exam. Nasogastric tube appropriately positioned in the decompressed stomach. 3. Bladder decompressed by Foley catheter. No emphysematous cystitis changes  appreciated on today's exam, perhaps obscured by the bladder wall decompression. 4. Small amount of free fluid in the abdomen and pelvis. Again, no abscess collection seen. 5. Small bilateral pleural effusions and associated bibasilar atelectasis. 6. Mild anasarca. 7. Aortic atherosclerosis. Electronically Signed   By: Bary Richard M.D.   On: 05/04/2017 16:29   Dg Abd Portable 1v  Result Date: 05/04/2017 CLINICAL DATA:  NG tube placement EXAM: PORTABLE ABDOMEN - 1 VIEW COMPARISON:  05/03/2017 FINDINGS: Esophageal tube tip is in the left upper quadrant in the region of gastric fundus. There are multiple dilated loops of small bowel within the central abdomen measuring up to 3.8 cm, concerning for a bowel obstruction. Surgical hardware in the lumbar spine. IMPRESSION: Esophageal tube tip projects over the gastric fundus. Multiple loops of dilated central small bowel are concerning for bowel obstruction Electronically Signed   By: Jasmine Pang M.D.   On: 05/04/2017 03:38   Dg Abd Portable 1v  Result Date: 05/03/2017 CLINICAL DATA:  Nasogastric tube placement. EXAM: PORTABLE ABDOMEN - 1 VIEW COMPARISON:  Plain  film of the abdomen from earlier same day. FINDINGS: Nasogastric tube is adequately positioned in the stomach with tip directed towards the stomach fundus. Dilated gas-filled small bowel loops are again seen within the central abdomen, incompletely imaged. IMPRESSION: Nasogastric tube appears adequately positioned in the stomach. Persistent evidence of ileus versus small bowel obstruction, incompletely imaged. Electronically Signed   By: Bary Richard M.D.   On: 05/03/2017 20:59   Dg Abd Portable 1v  Result Date: 05/03/2017 CLINICAL DATA:  83 year old female with abdominal pain. Subsequent encounter. EXAM: PORTABLE ABDOMEN - 1 VIEW COMPARISON:  05/02/2017 CT. FINDINGS: Progressive gaseous distention of small bowel and portions of colon which may reflect ileus or partial obstruction secondary to distal colitis. The possibility of free intraperitoneal air cannot be assessed on a supine view. Degenerative changes lumbar spine. IMPRESSION: Progressive gaseous distention of small bowel and portions of colon which may reflect ileus or partial obstruction secondary to distal colitis. The possibility of free intraperitoneal air cannot be assessed on a supine view. Electronically Signed   By: Lacy Duverney M.D.   On: 05/03/2017 18:18    Impression/Plan: 83 yo with urosepsis seen for GI consult due to rectosigmoid colitis. Her CT findings could be from obstipation although ischemic colitis also possible but less likely. Will give tap water enemas. May need a flexible sigmoidoscopy if pain persists after movement of her bowels. Continue Abx and supportive care. [facility] will f/u tomorrow.    LOS: 3 days   Edd Reppert C.  05/05/2017, 8:48 AM  Pager [phone]  AFTER 5 pm or on weekends please call [phone]

## 2017-05-06 ENCOUNTER — Inpatient Hospital Stay (HOSPITAL_COMMUNITY): Payer: Medicare Other

## 2017-05-06 LAB — CBC
HCT: 30.5 % — ABNORMAL LOW (ref 36.0–46.0)
HEMOGLOBIN: 10 g/dL — AB (ref 12.0–15.0)
MCH: 27.9 pg (ref 26.0–34.0)
MCHC: 32.8 g/dL (ref 30.0–36.0)
MCV: 85.2 fL (ref 78.0–100.0)
PLATELETS: 237 10*3/uL (ref 150–400)
RBC: 3.58 MIL/uL — AB (ref 3.87–5.11)
RDW: 16.4 % — ABNORMAL HIGH (ref 11.5–15.5)
WBC: 17.4 10*3/uL — AB (ref 4.0–10.5)

## 2017-05-06 LAB — BASIC METABOLIC PANEL
ANION GAP: 8 (ref 5–15)
BUN: 14 mg/dL (ref 6–20)
CALCIUM: 7.6 mg/dL — AB (ref 8.9–10.3)
CO2: 17 mmol/L — ABNORMAL LOW (ref 22–32)
CREATININE: 0.52 mg/dL (ref 0.44–1.00)
Chloride: 108 mmol/L (ref 101–111)
GFR calc Af Amer: >60 mL/min (ref >60)
Glucose, Bld: 118 mg/dL — ABNORMAL HIGH (ref 65–99)
Potassium: 3.6 mmol/L (ref 3.5–5.1)
SODIUM: 133 mmol/L — AB (ref 135–145)

## 2017-05-06 LAB — BLOOD GAS, ARTERIAL
ACID-BASE DEFICIT: 5 mmol/L — AB (ref 0.0–2.0)
BICARBONATE: 18.4 mmol/L — AB (ref 20.0–28.0)
DRAWN BY: 280981
O2 CONTENT: 2 L/min
O2 SAT: 96.2 %
PH ART: 7.443 (ref 7.350–7.450)
Patient temperature: 98.6
pCO2 arterial: 27.3 mmHg — ABNORMAL LOW (ref 32.0–48.0)
pO2, Arterial: 81.5 mmHg — ABNORMAL LOW (ref 83.0–108.0)

## 2017-05-06 LAB — GLUCOSE, CAPILLARY
GLUCOSE-CAPILLARY: 118 mg/dL — AB (ref 65–99)
GLUCOSE-CAPILLARY: 106 mg/dL — AB (ref 65–99)
Glucose-Capillary: 109 mg/dL — ABNORMAL HIGH (ref 65–99)
Glucose-Capillary: 111 mg/dL — ABNORMAL HIGH (ref 65–99)
Glucose-Capillary: 106 mg/dL — ABNORMAL HIGH (ref 65–99)
Glucose-Capillary: 119 mg/dL — ABNORMAL HIGH (ref 65–99)

## 2017-05-06 LAB — MAGNESIUM: MAGNESIUM: 2 mg/dL (ref 1.7–2.4)

## 2017-05-06 MED ORDER — POTASSIUM CHLORIDE 10 MEQ/100ML IV SOLN
10.0000 meq | INTRAVENOUS | Status: AC
  Administered 2017-05-06 (×4): 10 meq via INTRAVENOUS
  Filled 2017-05-06 (×4): qty 100

## 2017-05-06 MED ORDER — FENTANYL CITRATE (PF) 100 MCG/2ML IJ SOLN
12.5000 ug | Freq: Four times a day (QID) | INTRAMUSCULAR | Status: DC | PRN
Start: 2017-05-06 — End: 2017-05-09
  Administered 2017-05-07 – 2017-05-09 (×4): 12.5 ug via INTRAVENOUS
  Filled 2017-05-06 (×4): qty 2

## 2017-05-06 MED ORDER — IPRATROPIUM-ALBUTEROL 0.5-2.5 (3) MG/3ML IN SOLN
3.0000 mL | Freq: Four times a day (QID) | RESPIRATORY_TRACT | Status: DC | PRN
Start: 2017-05-06 — End: 2017-05-11
  Administered 2017-05-06: 3 mL via RESPIRATORY_TRACT
  Filled 2017-05-06: qty 3

## 2017-05-06 MED ORDER — SODIUM CHLORIDE 0.9 % IV SOLN
INTRAVENOUS | Status: DC
  Administered 2017-05-07: 08:00:00 via INTRAVENOUS

## 2017-05-06 NOTE — Progress Notes (Signed)
Soap sud enema given with little result only a few small pieces of hard stool. Will give schedule dulcolax supp. Later.

## 2017-05-06 NOTE — Progress Notes (Signed)
Patient ID: Sharon Gilmore, female   DOB: 04-30-1934, 83 y.o.   MRN: [id number]  [facility] Surgery Progress Note     Subjective: CC- ileus Son at bedside. States that patient went into AF with RVR yesterday, cardiology following. She has been more SOB with increased work of breathing today. O2 sats stable. Patient states that her abdomen feels about the same. She reports mild lower abdominal pain. BM yesterday, but denies any flatus today.   Objective: Vital signs in last 24 hours: Temp:  [97.5 F (36.4 C)-98.4 F (36.9 C)] 98.4 F (36.9 C) (09/20 0730) Pulse Rate:  [27-150] 88 (09/20 0730) Resp:  [17-31] 27 (09/20 0730) BP: (119-160)/(49-114) 149/68 (09/20 0730) SpO2:  [92 %-99 %] 96 % (09/20 0730) Last BM Date: 05/05/17  Intake/Output from previous day: 09/19 0701 - 09/20 0700 In: 2860.2 [I.V.:1900.2; NG/GT:60; IV Piggyback:900] Out: 650 [Urine:600; Emesis/NG output:50] Intake/Output this shift: Total I/O In: 202.7 [I.V.:102.7; IV Piggyback:100] Out: 10 [Emesis/NG output:10]  PE: Gen:  Alert, NAD, pleasant HEENT: EOM's intact, pupils equal and round Card:  irregularly irregular Pulm:  diffuse rhonchi and mild wheezing bilaterally, increased work of breathing Abd: Soft, mild distension, few BS, mild suprapubic tenderness Ext:  No erythema, edema, or tenderness BUE/BLE  Psych: A&Ox3  Skin: no rashes noted, warm and dry  Lab Results:   Recent Labs  05/05/17 0254 05/06/17 0246  WBC 18.3* 17.4*  HGB 10.6* 10.0*  HCT 31.4* 30.5*  PLT 257 237   BMET  Recent Labs  05/05/17 0254 05/06/17 0246  NA 132* 133*  K 3.6 3.6  CL 103 108  CO2 22 17*  GLUCOSE 108* 118*  BUN 15 14  CREATININE 0.56 0.52  CALCIUM 7.8* 7.6*   PT/INR No results for input(s): LABPROT, INR in the last 72 hours. CMP     Component Value Date/Time   NA 133 (L) 05/06/2017 0246   NA 139 01/20/2017 1554   K 3.6 05/06/2017 0246   CL 108 05/06/2017 0246   CO2 17 (L) 05/06/2017  0246   GLUCOSE 118 (H) 05/06/2017 0246   BUN 14 05/06/2017 0246   BUN 13 01/20/2017 1554   CREATININE 0.52 05/06/2017 0246   CALCIUM 7.6 (L) 05/06/2017 0246   PROT 5.0 (L) 05/05/2017 0254   PROT 6.7 01/20/2017 1554   ALBUMIN 1.7 (L) 05/05/2017 0254   ALBUMIN 3.8 01/20/2017 1554   AST 42 (H) 05/05/2017 0254   ALT 85 (H) 05/05/2017 0254   ALKPHOS 103 05/05/2017 0254   BILITOT 0.8 05/05/2017 0254   BILITOT 0.2 01/20/2017 1554   GFRNONAA >60 05/06/2017 0246   GFRAA >60 05/06/2017 0246   Lipase     Component Value Date/Time   LIPASE 20 05/03/2017 1649       Studies/Results: Ct Abdomen Pelvis W Contrast  Result Date: 05/04/2017 CLINICAL DATA:  Abdominal pain. CT abdomen 9/16 showing rectosigmoid colitis, emphysematous bladder. KUB on 09/17 showing possible SBO versus ileus. Nasogastric tube in place. Abdominal pain less than yesterday. EXAM: CT ABDOMEN AND PELVIS WITH CONTRAST TECHNIQUE: Multidetector CT imaging of the abdomen and pelvis was performed using the standard protocol following bolus administration of intravenous contrast. CONTRAST:  30mL ISOVUE-300 IOPAMIDOL (ISOVUE-300) INJECTION 61%, ISOVUE-300 IOPAMIDOL (ISOVUE-300) INJECTION 61% COMPARISON:  CT abdomen dated 05/02/2017.  KUB dated 05/04/2017. FINDINGS: Lower chest: Small bilateral pleural effusions and associated bibasilar atelectasis, both slightly increased compared to the earlier exam. Hepatobiliary: No focal liver abnormality is seen. Status post cholecystectomy. No biliary  dilatation. Pancreas: Unremarkable. No pancreatic ductal dilatation or surrounding inflammatory changes. Spleen: Normal in size without focal abnormality. Adrenals/Urinary Tract: Adrenal glands appear normal. Kidneys are unremarkable without mass, stone or hydronephrosis. No ureteral or bladder calculi identified. Bladder decompressed by Foley catheter. Stomach/Bowel: Persistent thickening of the walls of the rectosigmoid colon. Wall thickening  now also noted at the level of the upper/mid descending colon. Again noted is a large amount of stool and gas throughout the affected portions of the colon, and large amount of gas within the transverse colon. Again noted is pericolonic fluid stranding/inflammation which is stable or perhaps slightly increased compared to the previous exam. Stomach is decompressed by nasogastric tube. No significant small bowel dilatation. Vascular/Lymphatic: Aortic atherosclerosis. No enlarged abdominal or pelvic lymph nodes. Reproductive: Uterus and bilateral adnexa are unremarkable. Other: Small amount of free fluid within the abdomen and pelvis. No circumscribed abscess collection identified. No free intraperitoneal air or pneumatosis intestinalis seen. Musculoskeletal: No acute or suspicious osseous finding. Degenerative changes throughout the slightly scoliotic thoracolumbar spine, at least moderate in degree. Status post surgical decompression within the lumbar spine without evidence of surgical complicating feature. Mild anasarca. IMPRESSION: 1. Persistent thickening of the walls of the rectosigmoid colon. Wall thickening is now also noted at the levels of the upper and mid descending colon. Findings are consistent with colitis of infectious or inflammatory nature. Again noted is associated pericolonic fluid stranding/inflammation which is stable or perhaps slightly increased compared to the previous exam. No evidence of bowel perforation, pneumatosis intestinalis or abscess. 2. The affected portions of the colon are again distended with stool and gas, most suggestive of ileus. Asymmetrically prominent mount of stool within the rectosigmoid colon may indicate a component of constipation/impaction. No significant small bowel dilatation on today's exam. Nasogastric tube appropriately positioned in the decompressed stomach. 3. Bladder decompressed by Foley catheter. No emphysematous cystitis changes appreciated on today's exam,  perhaps obscured by the bladder wall decompression. 4. Small amount of free fluid in the abdomen and pelvis. Again, no abscess collection seen. 5. Small bilateral pleural effusions and associated bibasilar atelectasis. 6. Mild anasarca. 7. Aortic atherosclerosis. Electronically Signed   By: Bary Richard M.D.   On: 05/04/2017 16:29    Anti-infectives: Anti-infectives    Start     Dose/Rate Route Frequency Ordered Stop   05/04/17 2100  metroNIDAZOLE (FLAGYL) IVPB 500 mg     500 mg 100 mL/hr over 60 Minutes Intravenous Every 6 hours 05/04/17 2019     05/04/17 2100  ciprofloxacin (CIPRO) IVPB 400 mg     400 mg 200 mL/hr over 60 Minutes Intravenous Every 12 hours 05/04/17 2019     05/03/17 1500  cefTRIAXone (ROCEPHIN) 2 g in dextrose 5 % 50 mL IVPB  Status:  Discontinued     2 g 100 mL/hr over 30 Minutes Intravenous Every 24 hours 05/02/17 1438 05/03/17 1004   05/03/17 1030  cefTAZidime (FORTAZ) 1 g in dextrose 5 % 50 mL IVPB  Status:  Discontinued     1 g 100 mL/hr over 30 Minutes Intravenous Every 12 hours 05/03/17 1004 05/04/17 2019   05/02/17 1345  cefTRIAXone (ROCEPHIN) 1 g in dextrose 5 % 50 mL IVPB     1 g 100 mL/hr over 30 Minutes Intravenous  Once 05/02/17 1331 05/02/17 1450   05/02/17 1245  cefTRIAXone (ROCEPHIN) 1 g in dextrose 5 % 50 mL IVPB     1 g 100 mL/hr over 30 Minutes Intravenous  Once 05/02/17 1239  05/02/17 1357       Assessment/Plan HTN H/o AV block s/p Pacemaker Asthma HLD H/o spine surgery 04/23/17 Dr. Venetia Maxon New onset AF with RVR - cardiology following, interrogating pacemaker, make require anticoagulation  Emphasematous cystitis- IV ABX per primary/urology. Ucx growing E coli Rectosigmoid colitis- secondary inflammation due to above, no C diff done as stools are formed. No surgery needed from our standpoint.  Ileus - 2/2 to above - CT 9/18 showed persistent ileus, no signs of SBO - BM yesterday, no flatus today and still mildly distended  ID - Cipro  9/18>>, Flagyl 9/18>>, Fortaz 9/17>>9/18, Rocephin 9/16>>9/17 FEN - IVF, NPO/NGT VTE - SCDs, heparin Follow up - TBD  Plan - NG output low but patient still mildly distended with few BS on exam and not passing any flatus. Will order enema this morning. 4 runs of potassium. Check magnesium. Encourage OOB. Limit narcotics. GI following as well. Add duonebs and IS for wheezing. Medicine team checking CXR.   LOS: 4 days    Franne Forts , [facility] 05/06/2017, 8:01 AM Pager: [phone] Consults: [phone] Mon-Fri 7:00 am-4:30 pm Sat-Sun 7:00 am-11:30 am

## 2017-05-06 NOTE — Discharge Summary (Signed)
Family Medicine Teaching [facility] Discharge Summary  Patient name: Sharon Gilmore Medical record number: [id number] Date of birth: Nov 25, 1933 Age: 83 y.o. Gender: female Date of Admission: 05/02/2017  Date of Discharge: 05/10/17  Admitting Physician: Roslynn Amble, MD  Primary Care Provider: Olivia Canter, MD Consultants: GI, CCM, surgery  Indication for Hospitalization: Urosepsis, acute rectosigmoid colitis   Discharge Diagnoses/Problem List:  Rectosigmoid colitis, complicated by ileus New onset AF with RVR- reversible cause Urosepsis, resolved Asthma Elevated transaminases AKI, resolved Hx of spinal surgery HTN Hx of high degree AV block, s/p pacemaker HLD  Disposition: back to SNF  Discharge Condition: improved   Discharge Exam: see progress note from day of discharge   Brief Hospital Course:  83 yo female who presented with abdominal fullness, decreased appetite, and hypotension. PMH significant for known AV block s/p pacemaker placement in July 2018, HTN, lumbar stenosis with recent fusion on 04/23/17. Patient discharged to SNF after lumbar fusion. She had been having some cramps for past few days and then reported low blood pressures checked at her rehabilitation center. She was brought to the emergency department and noted to have concern for UTI with persistent hypotension plus lactic acidosis. She was initially admitted to  family practice teaching service, and started on ceftriaxzone. However despite 3 L fluid resuscitation her blood pressure remained low and CCM was called for admission to the ICU on 09/16, requiring phenylephrine. She also complained some left lower quadrant pain that was found to have rectosigmoid colitis and on 09/17 KUB showed possible SBO vs ileus. 09/18 Family Medicine resumed care and patient was transferred to step down. GI consulted for colitis, patient treated with 7 days of cipro/flagyl. Patient developed new onset afib with RVR  likely due to acute stress from admission. Cardiology saw and did a pacer interrogation which showed that this was the only occurrence. Went into sinus rhythm after diltiazem drip. She was placed on po diltiazem on 09/23. Cardiology recommended against anticoagulation. Patient placed on soft diet on day of discharge.   Issues for Follow Up:  1. Urinary retention - patient had foley inserted on admission 2/2 emphysematous cystitis. Failed voiding trail 9/22. Flomax started 9/24. Please reattempt voiding trial in 2-3 days.  2. Colitis with ileus - please observe for constipation. If patient does not stool, GI recommended Miralax daily for maintenance. In terms of diet, please advance as tolerated. Soft diet 9/24, consider regular diet 9/25.  3. Urosepsis - E coli completely treated.  4. Hypoalbuminemia- patient with some LE edema likely due to third spacing. Would recheck albumin in 1 week if edema persistent as albumin can be falsely low as an acute phase reactant.  5. New onset afib with RVR - due to acute stress. Now in SR, pacer interrogated without past episodes of afib. Cards recommended against anticoagulation.  6. Recent hx of spinal surgery - continue gabapentin 100 mg BID and 300 mg QHS for nerve pain. Follow up with Dr. Venetia Maxon.   Significant Procedures: None  Significant Labs and Imaging: Ct Abdomen Pelvis Wo Contrast  Result Date: 05/02/2017 CLINICAL DATA:  83 year old female with abdominal pain and distension. Sepsis. EXAM: CT ABDOMEN AND PELVIS WITHOUT CONTRAST TECHNIQUE: Multidetector CT imaging of the abdomen and pelvis was performed following the standard protocol without IV contrast. Intravenous contrast was not administered secondary to acute kidney injury. COMPARISON:  01/14/2017 CT FINDINGS: Please note that parenchymal abnormalities may be missed without intravenous contrast. Lower chest: Mild to moderate bibasilar atelectasis noted.  Hepatobiliary: No hepatic abnormalities  identified. The patient is status post cholecystectomy. No biliary dilatation. Pancreas: Unremarkable except for atrophy Spleen: Unremarkable Adrenals/Urinary Tract: Small foci of gas within the bladder wall noted likely representing emphysematous cystitis. The kidneys are unremarkable. Mild fullness of the adrenal glands again noted. No evidence of hydronephrosis or obstructing urinary calculi. Stomach/Bowel: Mildly distended fluid and gas-filled colon noted with large amount of gas in the distal sigmoid colon and rectum. Pericolonic inflammation of the descending and sigmoid colon noted as well of the rectum. No dilated small bowel loops are noted. No evidence of pneumoperitoneum. The appendix is normal. Vascular/Lymphatic: Aortic atherosclerosis. No enlarged abdominal or pelvic lymph nodes. Reproductive: Uterus and bilateral adnexa are unremarkable. Other: No free fluid or definite focal collection. Musculoskeletal: No acute or suspicious bony abnormality. Lumbar spine surgical changes noted. IMPRESSION: 1. Inflammation adjacent to the descending colon, sigmoid colon and rectum -question colitis. Large amount of stool within the distal sigmoid colon and rectum, with the remainder of the colon mildly distended with gas and fluid. 2. Small foci of gas within the bladder wall likely representing emphysematous cystitis. 3. Mild to moderate bibasilar atelectasis 4.  Aortic Atherosclerosis (ICD10-I70.0). Electronically Signed   By: Harmon Pier M.D.   On: 05/02/2017 18:37   US Renal  Result Date: 05/02/2017 CLINICAL DATA:  Acute kidney injury EXAM: RENAL / URINARY TRACT ULTRASOUND COMPLETE COMPARISON:  01/14/2017 FINDINGS: Right Kidney: Length: 10.0 cm. Normal echogenicity. No hydronephrosis. Small renal cortical cysts in the midpole regions measure 1.5 cm or less in diameter. At least 2 cysts visualized. Left Kidney: Length: 12.4 cm. Echogenicity within normal limits. No mass or hydronephrosis visualized. Bladder:  Completely empty.  Limited assess by ultrasound IMPRESSION: Incidental small renal cysts on the right kidney otherwise no acute finding by ultrasound. Negative for hydronephrosis. Electronically Signed   By: Judie Petit.  Shick M.D.   On: 05/02/2017 17:22   Dg Abd Portable 1v  Result Date: 05/03/2017 CLINICAL DATA:  Nasogastric tube placement. EXAM: PORTABLE ABDOMEN - 1 VIEW COMPARISON:  Plain film of the abdomen from earlier same day. FINDINGS: Nasogastric tube is adequately positioned in the stomach with tip directed towards the stomach fundus. Dilated gas-filled small bowel loops are again seen within the central abdomen, incompletely imaged. IMPRESSION: Nasogastric tube appears adequately positioned in the stomach. Persistent evidence of ileus versus small bowel obstruction, incompletely imaged. Electronically Signed   By: Bary Richard M.D.   On: 05/03/2017 20:59   Dg Abd Portable 1v  Result Date: 05/03/2017 CLINICAL DATA:  83 year old female with abdominal pain. Subsequent encounter. EXAM: PORTABLE ABDOMEN - 1 VIEW COMPARISON:  05/02/2017 CT. FINDINGS: Progressive gaseous distention of small bowel and portions of colon which may reflect ileus or partial obstruction secondary to distal colitis. The possibility of free intraperitoneal air cannot be assessed on a supine view. Degenerative changes lumbar spine. IMPRESSION: Progressive gaseous distention of small bowel and portions of colon which may reflect ileus or partial obstruction secondary to distal colitis. The possibility of free intraperitoneal air cannot be assessed on a supine view. Electronically Signed   By: Lacy Duverney M.D.   On: 05/03/2017 18:18   Recent Labs Lab 05/08/17 0307 05/09/17 0335 05/10/17 0142  WBC 18.9* 16.9* 16.6*  HGB 9.7* 9.6* 10.0*  HCT 28.5* 29.9* 30.1*  PLT 247 265 267    Recent Labs Lab 05/04/17 0217 05/04/17 0700 05/05/17 0254 05/06/17 0246 05/06/17 0721 05/07/17 0219 05/08/17 1041 05/09/17 0335  05/10/17 0142  NA 134*  --  132* 133*  --  135 135 135 135  K 3.1*  --  3.6 3.6  --  3.7 3.2* 3.4* 3.6  CL 108  --  103 108  --  109 110 109 110  CO2 17*  --  22 17*  --  21* 20* 21* 20*  GLUCOSE 126*  --  108* 118*  --  107* 105* 103* 103*  BUN 21*  --  15 14  --  8 6 5* <5*  CREATININE 0.62  --  0.56 0.52  --  0.48 0.38* 0.40* 0.36*  CALCIUM 7.8*  --  7.8* 7.6*  --  7.5* 7.4* 7.3* 7.4*  MG  --  1.9  --   --  2.0  --   --   --   --   ALKPHOS 97  --  103  --   --  88 71  --   --   AST 43*  --  42*  --   --  37 33  --   --   ALT 112*  --  85*  --   --  53 40  --   --   ALBUMIN 1.8*  --  1.7*  --   --  1.7* 1.5*  --   --     Results/Tests Pending at Time of Discharge:   Discharge Medications:  Allergies as of 05/10/2017      Reactions   Celebrex [celecoxib] Other (See Comments)   LEG ACHING   Codeine Nausea And Vomiting   Cymbalta [duloxetine Hcl] Other (See Comments)   Sensitive per [facility]   Doxycycline Swelling, Other (See Comments)   Facial swelling   Meloxicam Other (See Comments)   TUMMY ACHE, INEFFECTIVE   Nortriptyline Other (See Comments)   unknown   Percocet [oxycodone-acetaminophen] Nausea And Vomiting   Penicillins Rash, Other (See Comments)   Has patient had a PCN reaction causing immediate rash, facial/tongue/throat swelling, SOB or lightheadedness with hypotension: No Has patient had a PCN reaction causing severe rash involving mucus membranes or skin necrosis: No Has patient had a PCN reaction that required hospitalization No Has patient had a PCN reaction occurring within the last 10 years: No If all of the above answers are "NO", then may proceed with Cephalosporin use.      Medication List    STOP taking these medications   enalapril 20 MG tablet Commonly known as:  VASOTEC   hydrochlorothiazide 25 MG tablet Commonly known as:  HYDRODIURIL   HYDROmorphone 2 MG tablet Commonly known as:  DILAUDID   metoprolol tartrate 50 MG tablet Commonly known as:   LOPRESSOR   oxyCODONE 5 MG immediate release tablet Commonly known as:  Oxy IR/ROXICODONE     TAKE these medications   acetaminophen 500 MG tablet Commonly known as:  TYLENOL Take 1,000 mg by mouth 3 (three) times daily.   aspirin EC 81 MG tablet Take 81 mg by mouth at bedtime.   atorvastatin 20 MG tablet Commonly known as:  LIPITOR Take 20 mg by mouth at bedtime.   cyclobenzaprine 5 MG tablet Commonly known as:  FLEXERIL Take 5 mg by mouth 3 (three) times daily as needed for muscle spasms.   diltiazem 180 MG 24 hr capsule Commonly known as:  CARDIZEM CD Take 1 capsule (180 mg total) by mouth daily.   gabapentin 100 MG capsule Commonly known as:  NEURONTIN Take 1 capsule (100 mg total) by mouth 2 (two) times daily at 10  am and 4 pm. What changed:  how much to take  when to take this   gabapentin 600 MG tablet Commonly known as:  NEURONTIN Take 0.5 tablets (300 mg total) by mouth at bedtime. What changed:  You were already taking a medication with the same name, and this prescription was added. Make sure you understand how and when to take each.   Gerhardt's butt cream Crea Apply 1 application topically 4 (four) times daily as needed for irritation.   hydroxypropyl methylcellulose / hypromellose 2.5 % ophthalmic solution Commonly known as:  ISOPTO TEARS / GONIOVISC Place 1 drop into both eyes 4 (four) times daily as needed for dry eyes.   PRESERVISION AREDS 2 Caps Take 1 capsule by mouth 2 (two) times daily.   saccharomyces boulardii 250 MG capsule Commonly known as:  FLORASTOR Take 1 capsule (250 mg total) by mouth 2 (two) times daily.   tamsulosin 0.4 MG Caps capsule Commonly known as:  FLOMAX Take 1 capsule (0.4 mg total) by mouth daily after breakfast.   traMADol 50 MG tablet Commonly known as:  ULTRAM Take 1 tablet (50 mg total) by mouth every 6 (six) hours as needed for moderate pain. What changed:  when to take this   traZODone 50 MG  tablet Commonly known as:  DESYREL Take 1 tablet (50 mg total) by mouth at bedtime as needed for sleep.   Vitamin D3 2000 units Tabs Take 2,000 Units by mouth daily.            Discharge Care Instructions        Start     Ordered   05/11/17 0000  diltiazem (CARDIZEM CD) 180 MG 24 hr capsule  Daily     05/10/17 1531   05/11/17 0000  tamsulosin (FLOMAX) 0.4 MG CAPS capsule  Daily after breakfast     05/10/17 1531   05/10/17 0000  gabapentin (NEURONTIN) 100 MG capsule  2 times daily at 10am and 4pm     05/10/17 1531   05/10/17 0000  gabapentin (NEURONTIN) 600 MG tablet  Daily at bedtime     05/10/17 1531   05/10/17 0000  Hydrocortisone (GERHARDT'S BUTT CREAM) CREA  4 times daily PRN     05/10/17 1531   05/10/17 0000  saccharomyces boulardii (FLORASTOR) 250 MG capsule  2 times daily     05/10/17 1531   05/10/17 0000  traZODone (DESYREL) 50 MG tablet  At bedtime PRN     05/10/17 1531   05/10/17 0000  Increase activity slowly     05/10/17 1531      Discharge Instructions: Please refer to Patient Instructions section of EMR for full details.  Patient was counseled important signs and symptoms that should prompt return to medical care, changes in medications, dietary instructions, activity restrictions, and follow up appointments.   Follow-Up Appointments: Contact information for after-discharge care    Destination    [facility] .   Specialty:  Skilled Nursing Facility Contact information: [address] [place] [address] [phone]              Garth Bigness, MD 05/10/2017, 3:34 PM PGY-2, [facility]

## 2017-05-06 NOTE — Progress Notes (Signed)
Bladder scanned for only 21cc and 19cc. Will continue to monitor

## 2017-05-06 NOTE — Progress Notes (Signed)
[facility] PROGRESS NOTE Subjective Patient was admitted with septic shock to the [facility]. She has had atrial fibrillation with RVR and is being seen by cardiology. She was hypotensive. She had abdominal pain and CT scan showed segmental thickening of the descending and sigmoid colon with a fair amount stool. There was no free air etc. Enemas were ordered but were refused by the patient during the night.  Objective: Vital signs in last 24 hours: Temp:  [97.5 F (36.4 C)-98.4 F (36.9 C)] 98.4 F (36.9 C) (09/20 0730) Pulse Rate:  [27-150] 88 (09/20 0730) Resp:  [17-31] 27 (09/20 0730) BP: (119-160)/(49-114) 149/68 (09/20 0730) SpO2:  [92 %-99 %] 96 % (09/20 0730) Last BM Date: 05/05/17  Intake/Output from previous day: 09/19 0701 - 09/20 0700 In: 2860.2 [I.V.:1900.2; NG/GT:60; IV Piggyback:900] Out: 650 [Urine:600; Emesis/NG output:50] Intake/Output this shift: Total I/O In: 202.7 [I.V.:102.7; IV Piggyback:100] Out: 10 [Emesis/NG output:10]  PE: General--NG tube in place. Patient talking but seems confused Heart--tachycardia Lungs--. Rapid breathing Abdomen--slight distention but relatively nontender to exam. Minimal if any bowel sounds.  Lab Results:  Recent Labs  05/03/17 1649 05/04/17 0217 05/05/17 0254 05/06/17 0246  WBC 14.2* 18.0* 18.3* 17.4*  HGB 11.2* 11.4* 10.6* 10.0*  HCT 33.6* 33.6* 31.4* 30.5*  PLT 249 262 257 237   BMET  Recent Labs  05/04/17 0217 05/05/17 0254 05/06/17 0246  NA 134* 132* 133*  K 3.1* 3.6 3.6  CL 108 103 108  CO2 17* 22 17*  CREATININE 0.62 0.56 0.52   LFT  Recent Labs  05/04/17 0217 05/05/17 0254  PROT 5.2* 5.0*  AST 43* 42*  ALT 112* 85*  ALKPHOS 97 103  BILITOT 0.7 0.8   PT/INR No results for input(s): LABPROT, INR in the last 72 hours. PANCREAS  Recent Labs  05/03/17 1649  LIPASE 20         Studies/Results: Ct Abdomen Pelvis W Contrast  Result Date: 05/04/2017 CLINICAL DATA:   Abdominal pain. CT abdomen 9/16 showing rectosigmoid colitis, emphysematous bladder. KUB on 09/17 showing possible SBO versus ileus. Nasogastric tube in place. Abdominal pain less than yesterday. EXAM: CT ABDOMEN AND PELVIS WITH CONTRAST TECHNIQUE: Multidetector CT imaging of the abdomen and pelvis was performed using the standard protocol following bolus administration of intravenous contrast. CONTRAST:  30mL ISOVUE-300 IOPAMIDOL (ISOVUE-300) INJECTION 61%, ISOVUE-300 IOPAMIDOL (ISOVUE-300) INJECTION 61% COMPARISON:  CT abdomen dated 05/02/2017.  KUB dated 05/04/2017. FINDINGS: Lower chest: Small bilateral pleural effusions and associated bibasilar atelectasis, both slightly increased compared to the earlier exam. Hepatobiliary: No focal liver abnormality is seen. Status post cholecystectomy. No biliary dilatation. Pancreas: Unremarkable. No pancreatic ductal dilatation or surrounding inflammatory changes. Spleen: Normal in size without focal abnormality. Adrenals/Urinary Tract: Adrenal glands appear normal. Kidneys are unremarkable without mass, stone or hydronephrosis. No ureteral or bladder calculi identified. Bladder decompressed by Foley catheter. Stomach/Bowel: Persistent thickening of the walls of the rectosigmoid colon. Wall thickening now also noted at the level of the upper/mid descending colon. Again noted is a large amount of stool and gas throughout the affected portions of the colon, and large amount of gas within the transverse colon. Again noted is pericolonic fluid stranding/inflammation which is stable or perhaps slightly increased compared to the previous exam. Stomach is decompressed by nasogastric tube. No significant small bowel dilatation. Vascular/Lymphatic: Aortic atherosclerosis. No enlarged abdominal or pelvic lymph nodes. Reproductive: Uterus and bilateral adnexa are unremarkable. Other: Small amount of free fluid within the abdomen and pelvis. No  circumscribed abscess collection  identified. No free intraperitoneal air or pneumatosis intestinalis seen. Musculoskeletal: No acute or suspicious osseous finding. Degenerative changes throughout the slightly scoliotic thoracolumbar spine, at least moderate in degree. Status post surgical decompression within the lumbar spine without evidence of surgical complicating feature. Mild anasarca. IMPRESSION: 1. Persistent thickening of the walls of the rectosigmoid colon. Wall thickening is now also noted at the levels of the upper and mid descending colon. Findings are consistent with colitis of infectious or inflammatory nature. Again noted is associated pericolonic fluid stranding/inflammation which is stable or perhaps slightly increased compared to the previous exam. No evidence of bowel perforation, pneumatosis intestinalis or abscess. 2. The affected portions of the colon are again distended with stool and gas, most suggestive of ileus. Asymmetrically prominent mount of stool within the rectosigmoid colon may indicate a component of constipation/impaction. No significant small bowel dilatation on today's exam. Nasogastric tube appropriately positioned in the decompressed stomach. 3. Bladder decompressed by Foley catheter. No emphysematous cystitis changes appreciated on today's exam, perhaps obscured by the bladder wall decompression. 4. Small amount of free fluid in the abdomen and pelvis. Again, no abscess collection seen. 5. Small bilateral pleural effusions and associated bibasilar atelectasis. 6. Mild anasarca. 7. Aortic atherosclerosis. Electronically Signed   By: Bary Richard M.D.   On: 05/04/2017 16:29    Medications: I have reviewed the patient's current medications.  Assessment:  1. Urosepsis. Recent septic shock with hypotension 2. Segmental colitis. This most likely is result of her sepsis, hypotension is a result of sepsis and AFib.still a fair amount of solid stool in colon. Not particularly tender on exam today.  We will  recommend going ahead with enemas and keep NPO with NG suction. Have discussed this with her son who is at the bedside. At some point she may need it sigmoidoscopy with biopsy but ischemic colitis is the most likely etiology of her segmental colitis.   Sharon Gilmore,Sharon Gilmore 05/06/2017, 8:18 AM  This note was created using voice recognition software. Minor errors may Have occurred unintentionally.  Pager: [phone] If no answer or after hours call [phone]

## 2017-05-06 NOTE — Progress Notes (Signed)
Pacer interrogated the only AF she has had was what was noted yesterday.  Corine Shelter PA-C 05/06/2017 1:48 PM  Would hold off of anticoagulation for now.   Donato Schultz, MD

## 2017-05-06 NOTE — Progress Notes (Signed)
Notified md. Pt abdomen appears more distended since soap sud eneman and suppository with little results. New orders rec'd for KUB.

## 2017-05-06 NOTE — Progress Notes (Signed)
Family Medicine Teaching Service Daily Progress Note Intern Pager: [phone]  Patient name: Sharon Gilmore Medical record number: [id number] Date of birth: 01/14/1934 Age: 83 y.o. Gender: female  Primary Care Provider: Olivia Canter, MD Consultants: CCM, Urology, General surgery Code Status: Full  Pt Overview and Major Events to Date:  09/16 - admitted for hypotension, abd pain, urosepsis 09/16 - transfer to ICU for refractive hypotension, shock 09/16 - CT - rectosigmoid colitis  09/17 - KUB possible SBO vs ileus 09/18 - Family Medicine resume care 09/19 - Patient transferred to step down  Assessment and Plan: LUREE PALLA is a 83 y.o. female presenting with hypotension, abdominal pain, UA suggestive of urosepsis. PMH is significant for HTN, lumbar stenosis with recent decompression and fusion 04/2017, AV block s/p pacemaker 02/2017, asthma.   Rectosigmoid Colitis, complicated by ileus: LLQ abd pain. Repeat CT abd/pelvis on 09/18- Colitis. Affected portions of the colon are distended with stool and gas, most suggestive of ileus. NGT in place. Abdominal pain less than yesterday. - Ceftaz (9/17-9/18), cipro and flagyl (9/18-) started in setting of colitis- switch to PO when NTG out - NGT until bowels improve - GI consulted - NPO - Increase NS to 125 ml/hr for decreased urine output- if patient does not void consider reinserting foley - Decreased to fentanyl 12.5 q6h PRN pain, encourage movement - surgery rec appreciated, not surgical at this time - Daily dulcolax suppositories.  - Keep potassium >4, mag >2; given 40 meq of K, mag is 2.0 - May consider adding reglan  - WBC elevated to 22> 14.2> 17.4 on 9/20  Hyperventilation: Patient tachypneic this morning and put back on oxygen for this, despite good O2 sats. CXR on 09/20: Low volume chest with atelectasis and small pleural effusions. - ABG 7.443/27.3/81.5/18.4 - monitor - Duonebs and incentive spirometer   New onset  AF with RVR: Resolved. on IV Diltiazem, s/p 2 bolus doses. CHADS2VASC score of 4 (female, age > 29, HTN). Likely due to high catecholamines during stress from urosepsis. - Regular rhythm now while on dilt - Cardiology recs appreciated - Will continue to monitor - Obtaining CT w/o contrast to r/o CVA due to increased somnolence after Afib episode- Some progression of atrophy and small vessel ischemic change throughout the periventricular white matter. No acute intracranial Abnormality. Diffuse paranasal sinus disease. - Investigating pacer to r/o recurrent Afib.  - Will consider medical treatment if this is recurrent  Urosepsis: S/p rocephin (9/16-9/17).  Urine culture grew pansensitive Ecoli. Repeat CT with no emphysematous cystitis changes appreciated. - D/c foley- patient unable to urinate, had to have in and out cath last night with 600 cc drained - follow blood cultures - NGx3d - CBC  Asthma: patient reports no inhalers for several years, also denies URIs or hx of pneumonia. -monitor O2 sats on vitals   Hyponatremia: improving. 133 today. Patient on NS 100 ml/hr - Increased NS to 125 mL/hr   Elevated transaminases: Improving. AST 222> 85 and ALT 64> 42, WNL 03/13/2017. Elevated LFTs likely 2/2 shock liver with hypotension and lactic acidosis.  - Monitor - Repeat CMP tomorrow  AKI, resolved: baseline Cr. 0.5. Cr on admit 2.11 in the setting of hypovolemia 2/2 sepsis (dry mucuous membranes on exam). Cause of AKI likely shock 2/2 sepsis vs urinary retention. Renal U/S neg for hydro. Cr 9/20 on 0.52 - strict I/Os - monitor fluid status - monitor Cr  Hx of spinal surgery: recent spinal surgery with Dr. Venetia Maxon (L2-L5 decompression and  fusion) on 04/23/17. Prior to this, lived in independent apartment at [place]. D/c to SNF for rehab. No signs of infection or bleeding over site, patient denies back pain. -monitor on exam  HTN: slightly elevated BP with last BP of 149/68. home meds are  enalapril, HCTZ, lopressor.  -hold home meds   Hx of high degree AV block, s/p pacemaker: pacer placed 03/08/17. Will observe blood cultures, if positive, may need to consider echo to rule out vegetations on leads.  -monitor blood cultures -monitor on tele  HLD: home med is lipitor . Hold in the setting of elevated transaminases.  -hold home lipitor  FEN/GI: NPO  Prophylaxis: SCDs, heparin  Disposition: Step down  Subjective:  Patient is more somnolent this am. She is able to follow commands and answer questions. She reports that hr pain is better. She feels better after the bath, but she is also talking through her dreams.   Objective: Temp:  [97.5 F (36.4 C)-98.4 F (36.9 C)] 98.4 F (36.9 C) (09/20 0730) Pulse Rate:  [27-150] 88 (09/20 0730) Resp:  [17-31] 27 (09/20 0730) BP: (119-160)/(49-114) 149/68 (09/20 0730) SpO2:  [92 %-98 %] 96 % (09/20 0902)  Physical Exam: General: elderly female lying in bed, NAD, more somnolent Cardiovascular: RRR, no murmur Respiratory: BL rhonchi on exam with some mild end-expiratory wheeze, coarse breath sounds throughout Abdomen: less tender to palpation in lower quadrants, hypoactive BS, mildly distended, no rebound or guarding Extremities: no edema  Laboratory:  Recent Labs Lab 05/04/17 0217 05/05/17 0254 05/06/17 0246  WBC 18.0* 18.3* 17.4*  HGB 11.4* 10.6* 10.0*  HCT 33.6* 31.4* 30.5*  PLT 262 257 237    Recent Labs Lab 05/03/17 0429 05/04/17 0217 05/05/17 0254 05/06/17 0246  NA 134* 134* 132* 133*  K 3.9 3.1* 3.6 3.6  CL 105 108 103 108  CO2 19* 17* 22 17*  BUN 33* 21* 15 14  CREATININE 1.08* 0.62 0.56 0.52  CALCIUM 7.5* 7.8* 7.8* 7.6*  PROT 5.6* 5.2* 5.0*  --   BILITOT 0.9 0.7 0.8  --   ALKPHOS 114 97 103  --   ALT 165* 112* 85*  --   AST 55* 43* 42*  --   GLUCOSE 109* 126* 108* 118*    Imaging/Diagnostic Tests:  Ct Head Wo Contrast  Result Date: 05/06/2017 CLINICAL DATA:  Altered mental status,  loss of consciousness which is unexplained EXAM: CT HEAD WITHOUT CONTRAST TECHNIQUE: Contiguous axial images were obtained from the base of the skull through the vertex without intravenous contrast. COMPARISON:  CT brain of 04/04/2004 FINDINGS: Brain: The ventricular system is slightly more prominent as are the cortical sulci indicative of some progression of atrophy diffusely. The septum remains midline in position. There is a small low-attenuation adjacent to the anterior horn of the right lateral ventricle consistent with small vessel disease with moderate small vessel ischemic changes throughout the periventricular white matter as well. No hemorrhage, mass lesion, or acute infarction is currently seen. Vascular: No vascular abnormality is noted on this unenhanced study. Skull: On bone window images, no calvarial abnormality is noted. Sinuses/Orbits: There is partial opacification of the left frontal sinuses with mucosal thickening within the right frontal sinus, ethmoid air cells, and maxillary sinuses consistent with diffuse sinus disease. The sphenoid sinus is relatively well aerated with no abnormality noted. Other: None. IMPRESSION: 1. Some progression of atrophy and small vessel ischemic change throughout the periventricular white matter. No acute intracranial abnormality. 2. Diffuse paranasal sinus disease. Electronically  Signed   By: Dwyane Dee M.D.   On: 05/06/2017 10:14   Ct Abdomen Pelvis W Contrast  Result Date: 05/04/2017 CLINICAL DATA:  Abdominal pain. CT abdomen 9/16 showing rectosigmoid colitis, emphysematous bladder. KUB on 09/17 showing possible SBO versus ileus. Nasogastric tube in place. Abdominal pain less than yesterday. EXAM: CT ABDOMEN AND PELVIS WITH CONTRAST TECHNIQUE: Multidetector CT imaging of the abdomen and pelvis was performed using the standard protocol following bolus administration of intravenous contrast. CONTRAST:  30mL ISOVUE-300 IOPAMIDOL (ISOVUE-300) INJECTION 61%,  ISOVUE-300 IOPAMIDOL (ISOVUE-300) INJECTION 61% COMPARISON:  CT abdomen dated 05/02/2017.  KUB dated 05/04/2017. FINDINGS: Lower chest: Small bilateral pleural effusions and associated bibasilar atelectasis, both slightly increased compared to the earlier exam. Hepatobiliary: No focal liver abnormality is seen. Status post cholecystectomy. No biliary dilatation. Pancreas: Unremarkable. No pancreatic ductal dilatation or surrounding inflammatory changes. Spleen: Normal in size without focal abnormality. Adrenals/Urinary Tract: Adrenal glands appear normal. Kidneys are unremarkable without mass, stone or hydronephrosis. No ureteral or bladder calculi identified. Bladder decompressed by Foley catheter. Stomach/Bowel: Persistent thickening of the walls of the rectosigmoid colon. Wall thickening now also noted at the level of the upper/mid descending colon. Again noted is a large amount of stool and gas throughout the affected portions of the colon, and large amount of gas within the transverse colon. Again noted is pericolonic fluid stranding/inflammation which is stable or perhaps slightly increased compared to the previous exam. Stomach is decompressed by nasogastric tube. No significant small bowel dilatation. Vascular/Lymphatic: Aortic atherosclerosis. No enlarged abdominal or pelvic lymph nodes. Reproductive: Uterus and bilateral adnexa are unremarkable. Other: Small amount of free fluid within the abdomen and pelvis. No circumscribed abscess collection identified. No free intraperitoneal air or pneumatosis intestinalis seen. Musculoskeletal: No acute or suspicious osseous finding. Degenerative changes throughout the slightly scoliotic thoracolumbar spine, at least moderate in degree. Status post surgical decompression within the lumbar spine without evidence of surgical complicating feature. Mild anasarca. IMPRESSION: 1. Persistent thickening of the walls of the rectosigmoid colon. Wall thickening is now  also noted at the levels of the upper and mid descending colon. Findings are consistent with colitis of infectious or inflammatory nature. Again noted is associated pericolonic fluid stranding/inflammation which is stable or perhaps slightly increased compared to the previous exam. No evidence of bowel perforation, pneumatosis intestinalis or abscess. 2. The affected portions of the colon are again distended with stool and gas, most suggestive of ileus. Asymmetrically prominent mount of stool within the rectosigmoid colon may indicate a component of constipation/impaction. No significant small bowel dilatation on today's exam. Nasogastric tube appropriately positioned in the decompressed stomach. 3. Bladder decompressed by Foley catheter. No emphysematous cystitis changes appreciated on today's exam, perhaps obscured by the bladder wall decompression. 4. Small amount of free fluid in the abdomen and pelvis. Again, no abscess collection seen. 5. Small bilateral pleural effusions and associated bibasilar atelectasis. 6. Mild anasarca. 7. Aortic atherosclerosis. Electronically Signed   By: Bary Richard M.D.   On: 05/04/2017 16:29   Dg Chest Port 1 View  Result Date: 05/06/2017 CLINICAL DATA:  Abnormal breath sounds.  Shortness of breath EXAM: PORTABLE CHEST 1 VIEW COMPARISON:  03/13/2017 FINDINGS: Low volume chest with hazy opacities at the bases from atelectasis and small effusions based on abdominal CT 2 days ago. There is a nasogastric tube with tip over the upper stomach. Borderline cardiomegaly, accentuated by low volumes and mediastinal fat. Dual-chamber pacer leads from the left in stable position. No edema or  pneumothorax. IMPRESSION: Low volume chest with atelectasis and small pleural effusions. Electronically Signed   By: Marnee Spring M.D.   On: 05/06/2017 08:32     Shara Hartis, [place], DO 05/06/2017, 10:45 AM PGY-1, [facility] FPTS Intern pager: [phone], text pages welcome

## 2017-05-06 NOTE — Progress Notes (Addendum)
   Maintaining NSR on IV dilt.  Recommend converting to PO Dr. Mayford Knife consult note reviewed.  Will have pacer interrogated for silent AFIB.  If AFIB has been present in the past, recommend as she states, anticoagulation.  If no AFIB in the past, would consider this episode of afib to be related to underlying catecholamine state (reversible cause) and not anticoagulate long term.   Please call if any further ? Will sign off.   Donato Schultz, MD

## 2017-05-07 LAB — COMPREHENSIVE METABOLIC PANEL
ALK PHOS: 88 U/L (ref 38–126)
ALT: 53 U/L (ref 14–54)
AST: 37 U/L (ref 15–41)
Albumin: 1.7 g/dL — ABNORMAL LOW (ref 3.5–5.0)
Anion gap: 5 (ref 5–15)
BUN: 8 mg/dL (ref 6–20)
CALCIUM: 7.5 mg/dL — AB (ref 8.9–10.3)
CO2: 21 mmol/L — AB (ref 22–32)
Chloride: 109 mmol/L (ref 101–111)
Creatinine, Ser: 0.48 mg/dL (ref 0.44–1.00)
GFR calc Af Amer: >60 mL/min (ref >60)
GFR calc non Af Amer: >60 mL/min (ref >60)
Glucose, Bld: 107 mg/dL — ABNORMAL HIGH (ref 65–99)
Potassium: 3.7 mmol/L (ref 3.5–5.1)
SODIUM: 135 mmol/L (ref 135–145)
Total Bilirubin: 0.7 mg/dL (ref 0.3–1.2)
Total Protein: 4.7 g/dL — ABNORMAL LOW (ref 6.5–8.1)

## 2017-05-07 LAB — CULTURE, BLOOD (ROUTINE X 2)
CULTURE: NO GROWTH
CULTURE: NO GROWTH
SPECIAL REQUESTS: ADEQUATE
Special Requests: ADEQUATE

## 2017-05-07 LAB — GLUCOSE, CAPILLARY
GLUCOSE-CAPILLARY: 102 mg/dL — AB (ref 65–99)
GLUCOSE-CAPILLARY: 95 mg/dL (ref 65–99)
GLUCOSE-CAPILLARY: 108 mg/dL — AB (ref 65–99)
Glucose-Capillary: 97 mg/dL (ref 65–99)
Glucose-Capillary: 91 mg/dL (ref 65–99)
Glucose-Capillary: 106 mg/dL — ABNORMAL HIGH (ref 65–99)

## 2017-05-07 LAB — CBC
HCT: 29 % — ABNORMAL LOW (ref 36.0–46.0)
HEMOGLOBIN: 9.3 g/dL — AB (ref 12.0–15.0)
MCH: 27.7 pg (ref 26.0–34.0)
MCHC: 32.1 g/dL (ref 30.0–36.0)
MCV: 86.3 fL (ref 78.0–100.0)
PLATELETS: 238 10*3/uL (ref 150–400)
RBC: 3.36 MIL/uL — AB (ref 3.87–5.11)
RDW: 17 % — ABNORMAL HIGH (ref 11.5–15.5)
WBC: 16.4 10*3/uL — AB (ref 4.0–10.5)

## 2017-05-07 MED ORDER — POTASSIUM CHLORIDE 10 MEQ/100ML IV SOLN
10.0000 meq | INTRAVENOUS | Status: AC
  Administered 2017-05-07 (×3): 10 meq via INTRAVENOUS
  Filled 2017-05-07 (×3): qty 100

## 2017-05-07 NOTE — Progress Notes (Signed)
Patient ID: Sharon Gilmore, female   DOB: Mar 15, 1934, 83 y.o.   MRN: [id number] Agree with bowel management per GI. No acute surgical issues. Re-call surgery PRN. [name], MD, MPH, FACS Trauma: [phone] General Surgery: [phone]

## 2017-05-07 NOTE — Progress Notes (Signed)
Family Medicine Teaching Service Daily Progress Note Intern Pager: [phone]  Patient name: Sharon Gilmore Medical record number: [id number] Date of birth: Jan 28, 1934 Age: 83 y.o. Gender: female  Primary Care Provider: Olivia Canter, MD Consultants: CCM, Urology, General surgery Code Status: Full  Pt Overview and Major Events to Date:  09/16 - admitted for hypotension, abd pain, urosepsis 09/16 - transfer to ICU for refractive hypotension, shock 09/16 - CT - rectosigmoid colitis  09/17 - KUB possible SBO vs ileus 09/18 - Family Medicine resume care 09/19 - Patient transferred to step down  Assessment and Plan: Sharon Gilmore is a 83 y.o. female presenting with hypotension, abdominal pain, UA suggestive of urosepsis. PMH is significant for HTN, lumbar stenosis with recent decompression and fusion 04/2017, AV block s/p pacemaker 02/2017, asthma.   Rectosigmoid Colitis, complicated by ileus: LLQ abd pain, imrpoving. Repeat CT abd/pelvis on 09/18- Colitis. Ileus. NGT removed by patient this am. Abdominal pain less than yesterday. - Ceftaz (9/17-9/18), cipro and flagyl (9/18-) started in setting of colitis- switch to PO  - NGT removed by patient this am- monitor N/V and consider adding PO meds with sips of medicine 6-8 hours post removal - GI consulted - NPO except for ice chips, if doing well around noon- 2pm can switch her IV meds to po- dilt, cipro, flagyl - Discontinued NS 125 ml/hr due to possible fluid overload - Decreased to fentanyl 12.5 q6h PRN pain, encourage movement - surgery has signed off - Daily dulcolax suppositories.  - Keep potassium >4, mag >2; given 30 meq of K, mag 2.0 on 9/20 - May consider adding reglan  - WBC elevated to 22> 14.2> 16.4 on 9/21 - OK to go to floor if patient able to tolerate PO dilt  Hyperventilation, resolved: Patient tachypneic this morning and on 2L [place]. Has good O2 sats. CXR on 09/20: Low volume chest with atelectasis and small  pleural effusions. - monitor - Wean Sharon Gilmore as able  New onset AF with RVR: Resolved. on IV Diltiazem, s/p 2 bolus doses. CHADS2VASC score of 4 (female, age > 61, HTN). Likely due to high catecholamines during stress from urosepsis. - Regular rhythm now while on dilt- switch to PO as above - Cardiology has signed off - Will continue to monitor - Pacer indicating that this was the sole episode and no medical treatment indicated  Urosepsis, improving: S/p rocephin (9/16-9/17).  Urine culture grew pansensitive Ecoli. cipro and flagyl (9/18-) - Foley re-inserted 09/20 after failed voiding trial> repeat voiding trial tomorrow  - follow blood cultures - NGx4d - CBC  Asthma: patient reports no inhalers for several years, also denies URIs or hx of pneumonia. - monitor O2 sats- wean Sharon Gilmore as able - Duonebs and incentive spirometer   Hyponatremia: Resolved. 135 today. Patient on NS to 125 mL/hr   Elevated transaminases: Resolved. AST 222> 37 and ALT 64> 53, WNL 03/13/2017. Elevated LFTs likely 2/2 shock liver with hypotension and lactic acidosis.  - Monitor  AKI, resolved: baseline Cr. 0.5. Cr on admit 2.11 in the setting of hypovolemia 2/2 sepsis (dry mucuous membranes on exam). Cause of AKI likely shock 2/2 sepsis vs urinary retention. Renal U/S neg for hydro. Cr 9/21 on 0.48 - strict I/Os - monitor fluid status - monitor Cr  Hx of spinal surgery: recent spinal surgery with Dr. Venetia Maxon (L2-L5 decompression and fusion) on 04/23/17. Prior to this, lived in independent apartment at [place]. D/c to SNF for rehab. No signs of infection or bleeding over  site, patient denies back pain. -monitor on exam - Some increased nerve pain down thigh from recent surgery while in chair, plan to increase gabapentin and change to TID once taking PO meds  HTN: slightly elevated BP through the night with last BP of 138/74. home meds are enalapril, HCTZ, lopressor.  -hold home meds   Hx of high degree AV block, s/p  pacemaker: pacer placed 03/08/17. Will observe blood cultures, if positive, may need to consider echo to rule out vegetations on leads.  -monitor blood cultures -monitor on tele  HLD: home med is lipitor . Hold in the setting of elevated transaminases.  -hold home lipitor  FEN/GI: NPO  Prophylaxis: SCDs, heparin  Disposition: In step down, pending po dilt then to med-surg   Subjective:  Patient is sitting up in chair and complaining of leg pain, reports decreased abdominal pain. Much less confused today. 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 She is ready to go to the floor and get out of step down.    Objective: Temp:  [97.3 F (36.3 C)-98.7 F (37.1 C)] 98.5 F (36.9 C) (09/21 0300) Pulse Rate:  [83-98] 89 (09/21 0400) Resp:  [19-29] 25 (09/21 0600) BP: (112-158)/(49-109) 138/74 (09/21 0600) SpO2:  [93 %-99 %] 98 % (09/21 0400)  Physical Exam: General: elderly female lying in bed, NAD, more somnolent Cardiovascular: RRR, no murmur Respiratory: BL rhonchi on exam with some mild end-expiratory wheeze, coarse breath sounds throughout Abdomen: less tender to palpation in lower quadrants, hypoactive BS, mildly distended, no rebound or guarding Extremities: trace LE edema  Laboratory:  Recent Labs Lab 05/05/17 0254 05/06/17 0246 05/07/17 0219  WBC 18.3* 17.4* 16.4*  HGB 10.6* 10.0* 9.3*  HCT 31.4* 30.5* 29.0*  PLT 257 237 238    Recent Labs Lab 05/04/17 0217 05/05/17 0254 05/06/17 0246 05/07/17 0219  NA 134* 132* 133* 135  K 3.1* 3.6 3.6 3.7  CL 108 103 108 109  CO2 17* 22 17* 21*  BUN 21* CREATININE 0.62 0.56 0.52 0.48  CALCIUM 7.8* 7.8* 7.6* 7.5*  PROT 5.2* 5.0*  --  4.7*  BILITOT 0.7 0.8  --  0.7  ALKPHOS 97 103  --  88  ALT 112* 85*  --  53  AST 43* 42*  --  37  GLUCOSE 126* 108* 118* 107*    Imaging/Diagnostic Tests:  Ct Head Wo Contrast  Result Date: 05/06/2017 CLINICAL DATA:  Altered mental  status, loss of consciousness which is unexplained EXAM: CT HEAD WITHOUT CONTRAST TECHNIQUE: Contiguous axial images were obtained from the base of the skull through the vertex without intravenous contrast. COMPARISON:  CT brain of 04/04/2004 FINDINGS: Brain: The ventricular system is slightly more prominent as are the cortical sulci indicative of some progression of atrophy diffusely. The septum remains midline in position. There is a small low-attenuation adjacent to the anterior horn of the right lateral ventricle consistent with small vessel disease with moderate small vessel ischemic changes throughout the periventricular white matter as well. No hemorrhage, mass lesion, or acute infarction is currently seen. Vascular: No vascular abnormality is noted on this unenhanced study. Skull: On bone window images, no calvarial abnormality is noted. Sinuses/Orbits: There is partial opacification of the left frontal sinuses with mucosal thickening within the right frontal sinus, ethmoid air cells, and maxillary sinuses consistent with diffuse sinus disease. The sphenoid sinus is relatively well aerated with no abnormality noted. Other:  None. IMPRESSION: 1. Some progression of atrophy and small vessel ischemic change throughout the periventricular white matter. No acute intracranial abnormality. 2. Diffuse paranasal sinus disease. Electronically Signed   By: Dwyane Dee M.D.   On: 05/06/2017 10:14   Dg Chest Port 1 View  Result Date: 05/06/2017 CLINICAL DATA:  Abnormal breath sounds.  Shortness of breath EXAM: PORTABLE CHEST 1 VIEW COMPARISON:  03/13/2017 FINDINGS: Low volume chest with hazy opacities at the bases from atelectasis and small effusions based on abdominal CT 2 days ago. There is a nasogastric tube with tip over the upper stomach. Borderline cardiomegaly, accentuated by low volumes and mediastinal fat. Dual-chamber pacer leads from the left in stable position. No edema or pneumothorax. IMPRESSION: Low  volume chest with atelectasis and small pleural effusions. Electronically Signed   By: Marnee Spring M.D.   On: 05/06/2017 08:32   Dg Abd Acute W/chest  Result Date: 05/06/2017 CLINICAL DATA:  Bowel perforation EXAM: DG ABDOMEN ACUTE W/ 1V CHEST COMPARISON:  05/06/2017, 05/04/2017 FINDINGS: Low lung volumes with minor basilar atelectasis. No significant enlarging effusion or pneumothorax. Trachea is midline. Left subclavian 2 lead pacer noted. NG tube within the stomach. Postop changes noted of the spine.  Bones are osteopenic . Limited upright exam. No large volume of free air appreciated. Diffuse gas distention of bowel remains nonspecific for diffuse ileus versus distal obstruction. No significant change compared CT of 05/04/2017. IMPRESSION: Stable gaseous distention of the bowel. Low lung volumes with bibasilar atelectasis Electronically Signed   By: Judie Petit.  Shick M.D.   On: 05/06/2017 15:07     Sharon Gilmore, [place], DO 05/07/2017, 6:43 AM PGY-1, [facility] FPTS Intern pager: [phone], text pages welcome

## 2017-05-07 NOTE — Care Management Note (Signed)
Case Management Note  Patient Details  Name: Sharon Gilmore MRN: [id number] Date of Birth: 05-27-1934  Subjective/Objective:                    Action/Plan: Pt discharging to [facility]. No further needs per CM.   Expected Discharge Date:                  Expected Discharge Plan:  Skilled Nursing Facility (From SNF)  In-House Referral:  Clinical Social Work  Discharge planning Services  CM Consult  Post Acute Care Choice:    Choice offered to:     DME Arranged:    DME Agency:     HH Arranged:    HH Agency:     Status of Service:     If discussed at [employer], dates discussed:    Additional Comments: 05/07/2017 Pt on SD - on IV Cardizem, IV antibiotics and PRN IV fentanyl Cherylann Parr, RN 05/07/2017, 10:56 AM

## 2017-05-07 NOTE — Progress Notes (Signed)
[facility] Progress Note  Sharon Gilmore 83 y.o. [birth year]  CC:  Colitis   Subjective: Patient currently resting in the recliner. Son at bedside. Discussed with the nursing staff. Had the clear liquid the bowel movement yesterday which could be just residual of enemas . She pulled out her NG tube this morning. No further vomiting this morning.    Objective: Vital signs in last 24 hours: Vitals:   05/07/17 0600 05/07/17 0735  BP: 138/74 (!) 148/74  Pulse:  84  Resp: (!) 25 (!) 26  Temp:  98.3 F (36.8 C)  SpO2:  95%    Physical Exam:  General:  Alert, cooperative, Appears to be in mild distress   Head:  Normocephalic, without obvious abnormality, atraumatic  Eyes:  , EOM's intact,   Lungs:   Bilateral rhonchi and fine basilar crackles. Mild respiratory distress noted   Heart:  Regular rate and rhythm, S1, S2 normal  Abdomen:   Soft, non-tender, Large binder on the abdomen noted but I was able to hear bowel sounds in the lower quadrants. no masses,   Extremities: Bilateral lower oximetry mottling noted.        Lab Results:  Recent Labs  05/06/17 0246 05/06/17 0721 05/07/17 0219  NA 133*  --  135  K 3.6  --  3.7  CL 108  --  109  CO2 17*  --  21*  GLUCOSE 118*  --  107*  BUN 14  --  8  CREATININE 0.52  --  0.48  CALCIUM 7.6*  --  7.5*  MG  --  2.0  --     Recent Labs  05/05/17 0254 05/07/17 0219  AST 42* 37  ALT 85* 53  ALKPHOS 103 88  BILITOT 0.8 0.7  PROT 5.0* 4.7*  ALBUMIN 1.7* 1.7*    Recent Labs  05/05/17 0254 05/06/17 0246 05/07/17 0219  WBC 18.3* 17.4* 16.4*  NEUTROABS 16.9*  --   --   HGB 10.6* 10.0* 9.3*  HCT 31.4* 30.5* 29.0*  MCV 84.4 85.2 86.3  PLT 257 237 238   No results for input(s): LABPROT, INR in the last 72 hours.    Assessment/Plan: - Colitis with CT scan showing thickening of rectosigmoid colon. - Ileus - Urosepsis. - Atrial fibrillation.  - High  degree heart block status post pacemaker placement  on July 2018  Recommendations ---------------------------- - Detailed discussion with son regarding management options. Given her multiple comorbidities, would hold off on flexible sigmoidoscopy for now as I do not think this will change  management at this time. - Recommend continuation of antibiotics. Okay to have ice chips. - Continue as needed Enemas.  - May consider flexible sigmoidoscopy next week if no improvement.    Kathi Der MD, FACP 05/07/2017, 9:44 AM  Pager [phone]  If no answer or after 5 PM call [phone]

## 2017-05-07 NOTE — Progress Notes (Signed)
Patient removed NG tube and refused replacement despite education on purpose and benefit of NG.  MD notified.  Dr. Talbert Forest to bedside, ok to leave tube out for now, will follow GI recommendations upon rounds. RN will continue to monitor patient.

## 2017-05-08 ENCOUNTER — Inpatient Hospital Stay (HOSPITAL_COMMUNITY): Payer: Medicare Other

## 2017-05-08 LAB — COMPREHENSIVE METABOLIC PANEL
ALT: 40 U/L (ref 14–54)
ANION GAP: 5 (ref 5–15)
AST: 33 U/L (ref 15–41)
Albumin: 1.5 g/dL — ABNORMAL LOW (ref 3.5–5.0)
Alkaline Phosphatase: 71 U/L (ref 38–126)
BILIRUBIN TOTAL: 0.5 mg/dL (ref 0.3–1.2)
BUN: 6 mg/dL (ref 6–20)
CO2: 20 mmol/L — ABNORMAL LOW (ref 22–32)
Calcium: 7.4 mg/dL — ABNORMAL LOW (ref 8.9–10.3)
Chloride: 110 mmol/L (ref 101–111)
Creatinine, Ser: 0.38 mg/dL — ABNORMAL LOW (ref 0.44–1.00)
Glucose, Bld: 105 mg/dL — ABNORMAL HIGH (ref 65–99)
POTASSIUM: 3.2 mmol/L — AB (ref 3.5–5.1)
Sodium: 135 mmol/L (ref 135–145)
TOTAL PROTEIN: 4.4 g/dL — AB (ref 6.5–8.1)

## 2017-05-08 LAB — CBC
HCT: 28.5 % — ABNORMAL LOW (ref 36.0–46.0)
Hemoglobin: 9.7 g/dL — ABNORMAL LOW (ref 12.0–15.0)
MCH: 29.4 pg (ref 26.0–34.0)
MCHC: 34 g/dL (ref 30.0–36.0)
MCV: 86.4 fL (ref 78.0–100.0)
PLATELETS: 247 10*3/uL (ref 150–400)
RBC: 3.3 MIL/uL — ABNORMAL LOW (ref 3.87–5.11)
RDW: 17.3 % — AB (ref 11.5–15.5)
WBC: 18.9 10*3/uL — AB (ref 4.0–10.5)

## 2017-05-08 LAB — GLUCOSE, CAPILLARY
GLUCOSE-CAPILLARY: 102 mg/dL — AB (ref 65–99)
GLUCOSE-CAPILLARY: 84 mg/dL (ref 65–99)
GLUCOSE-CAPILLARY: 95 mg/dL (ref 65–99)
GLUCOSE-CAPILLARY: 97 mg/dL (ref 65–99)
Glucose-Capillary: 94 mg/dL (ref 65–99)
Glucose-Capillary: 114 mg/dL — ABNORMAL HIGH (ref 65–99)

## 2017-05-08 MED ORDER — SODIUM CHLORIDE 0.9 % IV SOLN
INTRAVENOUS | Status: DC
  Administered 2017-05-08: 17:00:00 via INTRAVENOUS

## 2017-05-08 MED ORDER — TRAZODONE HCL 50 MG PO TABS
50.0000 mg | ORAL_TABLET | Freq: Every evening | ORAL | Status: DC | PRN
  Administered 2017-05-08: 50 mg via ORAL
  Filled 2017-05-08: qty 1

## 2017-05-08 MED ORDER — BACLOFEN 5 MG HALF TABLET
5.0000 mg | ORAL_TABLET | Freq: Once | ORAL | Status: AC | PRN
  Administered 2017-05-08: 5 mg via ORAL
  Filled 2017-05-08 (×2): qty 1

## 2017-05-08 MED ORDER — GERHARDT'S BUTT CREAM
TOPICAL_CREAM | Freq: Four times a day (QID) | CUTANEOUS | Status: DC | PRN
  Administered 2017-05-08 – 2017-05-10 (×2): via TOPICAL
  Filled 2017-05-08: qty 1

## 2017-05-08 MED ORDER — GABAPENTIN 600 MG PO TABS
300.0000 mg | ORAL_TABLET | Freq: Every day | ORAL | Status: DC
  Administered 2017-05-08: 300 mg via ORAL
  Filled 2017-05-08: qty 1

## 2017-05-08 MED ORDER — POTASSIUM CHLORIDE 10 MEQ/100ML IV SOLN
10.0000 meq | INTRAVENOUS | Status: AC
  Administered 2017-05-08 (×4): 10 meq via INTRAVENOUS
  Filled 2017-05-08 (×4): qty 100

## 2017-05-08 NOTE — Progress Notes (Signed)
Sharon Gilmore 1:39 PM  Subjective: Patient doing a little better and case discussed with my partner Dr. B as well as her son and daughter-in-law and her hospital computer chart reviewed and we discussed ischemic colitis and her white count and answered their questions tolerating clear liquids  Objective: Vital signs stable afebrile no acute distress sleeping comfortably in the bed but arousable and answering questions appropriately abdomen is soft nontender white count still elevated  Assessment: Ischemic colitis  Plan: Will check on tomorrow and await white count then and discussed flexible sigmoidoscopy with son  [facility]  Pager [phone] After 5PM or if no answer call [phone]

## 2017-05-08 NOTE — Progress Notes (Signed)
Family Medicine Teaching Service Daily Progress Note Intern Pager: [phone]  Patient name: Sharon Gilmore Medical record number: [id number] Date of birth: Dec 13, 1933 Age: 83 y.o. Gender: female  Primary Care Provider: Olivia Canter, MD Consultants: CCM, Urology, General surgery Code Status: Full  Pt Overview and Major Events to Date:  09/16 - admitted for hypotension, abd pain, urosepsis 09/16 - transfer to ICU for refractive hypotension, shock 09/16 - CT - rectosigmoid colitis  09/17 - KUB possible SBO vs ileus 09/18 - Family Medicine resume care 09/19 - Patient transferred to step down 9/21: NGT removed by patient   Assessment and Plan: Sharon Gilmore is a 83 y.o. female presenting with hypotension, abdominal pain, UA suggestive of urosepsis. PMH is significant for HTN, lumbar stenosis with recent decompression and fusion 04/2017, AV block s/p pacemaker 02/2017, asthma.   Rectosigmoid Colitis, complicated by ileus:  LLQ abd pain, imrpoving. Repeat CT abd/pelvis on 09/18- Colitis. Ileus.  WBC increased from yesterday but clinically still doing well.. - will advance to clear liquid diet today - gentle IVF @ 50 cc/hr with plans to discontinue later today if good PO - Ceftaz (9/17-9/18), cipro and flagyl (9/18 >) - GI consulted: would hold off flex sig due to co-morbidities (may consider next week), cont abx.  - Decreased to fentanyl 12.5 q6h PRN pain, encourage movement - surgery has signed off - Daily dulcolax suppositories.  - Keep potassium >4, mag >2; given 30 meq of K, mag 2.0 on 9/20 - WBC elevated to 22> 14.2> 16.4 > 18.9 9/22  Hypokalemia: 3.2 today  - IV K 10 mEq x 4 runs  Hyperventilation, resolved:  Has good O2 sats. CXR on 09/20: Low volume chest with atelectasis and small pleural effusions. - monitor - Wean [place]enio Saenz as able  New onset AF with RVR: Resolved.  CHADS2VASC score of 4 (female, age > 25, HTN). Likely due to high catecholamines during stress from  urosepsis. Pacer indicating that this was the sole episode and no anticoagulation indicated - Regular rhythm now while on dilt. - if tolerates clear liquid diet, will switch to PO dilt - Cardiology has signed off - Will continue to monitor  Urosepsis, resolved: S/p rocephin (9/16-9/17).  Urine culture grew pansensitive Ecoli. cipro and flagyl (9/18-) - Foley re-inserted 09/20 after failed voiding trial> repeat voiding trial tomorrow  - follow blood cultures - NGx4d - voiding trial today   Asthma: patient reports no inhalers for several years, also denies URIs or hx of pneumonia. - monitor O2 sats- wean [place] as able - Duonebs and incentive spirometer   Hyponatremia, resolved: Resolved. 135 today.  Elevated transaminases, resolved: Marland Kitchen AST 222> 37 and ALT 64> 53, WNL 03/13/2017. Elevated LFTs likely 2/2 shock liver with hypotension and lactic acidosis.  - Monitor  AKI, resolved: baseline Cr. 0.5. Cr on admit 2.11 in the setting of hypovolemia 2/2 sepsis (dry mucuous membranes on exam). Cause of AKI likely shock 2/2 sepsis vs urinary retention. Renal U/S neg for hydro. Cr 9/21 on 0.48 - strict I/Os - monitor fluid status - monitor Cr  Hx of spinal surgery: recent spinal surgery with Dr. Venetia Maxon (L2-L5 decompression and fusion) on 04/23/17. Prior to this, lived in independent apartment at [place]. D/c to SNF for rehab. No signs of infection or bleeding over site, patient denies back pain. -monitor on exam - Some increased nerve pain down thigh from recent surgery while in chair, plan to increase gabapentin and change to TID once taking PO meds  HTN: slightly elevated BP. home meds are enalapril, HCTZ, lopressor.  -hold home meds   Hx of high degree AV block, s/p pacemaker: pacer placed 03/08/17.   HLD: home med is lipitor . Hold in the setting of elevated transaminases.  -hold home lipitor. Will add if tolerates diet today   FEN/GI: clear liquid diet today Prophylaxis: SCDs,  heparin  Disposition: In step down, pending po dilt then to med-surg   Subjective:  Reports she is doing okay but feels overall tired. She didn't sleep well last night. She feels a little more bloated today. No abdominal pain. She does not think she has passes gas.   Objective: Temp:  [97.9 F (36.6 C)-98.3 F (36.8 C)] 98.1 F (36.7 C) (09/22 0400) Pulse Rate:  [77-89] 85 (09/22 0700) Resp:  [20-29] 25 (09/22 0700) BP: (91-157)/(57-83) 146/63 (09/22 0700) SpO2:  [92 %-99 %] 96 % (09/22 0700)  Physical Exam:  General: elderly female lying in bed, NAD Cardiovascular: RRR, no murmur Respiratory: on 2L [place], mild tachypnea. Mildly coarse breath sounds but no crackles noted.  Abdomen: no tenderness to palpation. hypoactive BS, mildly distended, no rebound or guarding Extremities: trace LE edema  Laboratory:  Recent Labs Lab 05/06/17 0246 05/07/17 0219 05/08/17 0307  WBC 17.4* 16.4* 18.9*  HGB 10.0* 9.3* 9.7*  HCT 30.5* 29.0* 28.5*  PLT 237 238 247    Recent Labs Lab 05/04/17 0217 05/05/17 0254 05/06/17 0246 05/07/17 0219  NA 134* 132* 133* 135  K 3.1* 3.6 3.6 3.7  CL 108 103 108 109  CO2 17* 22 17* 21*  BUN 21* CREATININE 0.62 0.56 0.52 0.48  CALCIUM 7.8* 7.8* 7.6* 7.5*  PROT 5.2* 5.0*  --  4.7*  BILITOT 0.7 0.8  --  0.7  ALKPHOS 97 103  --  88  ALT 112* 85*  --  53  AST 43* 42*  --  37  GLUCOSE 126* 108* 118* 107*    Imaging/Diagnostic Tests:  Ct Head Wo Contrast  Result Date: 05/06/2017 CLINICAL DATA:  Altered mental status, loss of consciousness which is unexplained EXAM: CT HEAD WITHOUT CONTRAST TECHNIQUE: Contiguous axial images were obtained from the base of the skull through the vertex without intravenous contrast. COMPARISON:  CT brain of 04/04/2004 FINDINGS: Brain: The ventricular system is slightly more prominent as are the cortical sulci indicative of some progression of atrophy diffusely. The septum remains midline in position. There is a  small low-attenuation adjacent to the anterior horn of the right lateral ventricle consistent with small vessel disease with moderate small vessel ischemic changes throughout the periventricular white matter as well. No hemorrhage, mass lesion, or acute infarction is currently seen. Vascular: No vascular abnormality is noted on this unenhanced study. Skull: On bone window images, no calvarial abnormality is noted. Sinuses/Orbits: There is partial opacification of the left frontal sinuses with mucosal thickening within the right frontal sinus, ethmoid air cells, and maxillary sinuses consistent with diffuse sinus disease. The sphenoid sinus is relatively well aerated with no abnormality noted. Other: None. IMPRESSION: 1. Some progression of atrophy and small vessel ischemic change throughout the periventricular white matter. No acute intracranial abnormality. 2. Diffuse paranasal sinus disease. Electronically Signed   By: Dwyane Dee M.D.   On: 05/06/2017 10:14   Dg Abd Acute W/chest  Result Date: 05/06/2017 CLINICAL DATA:  Bowel perforation EXAM: DG ABDOMEN ACUTE W/ 1V CHEST COMPARISON:  05/06/2017, 05/04/2017 FINDINGS: Low lung volumes with minor basilar atelectasis. No significant  enlarging effusion or pneumothorax. Trachea is midline. Left subclavian 2 lead pacer noted. NG tube within the stomach. Postop changes noted of the spine.  Bones are osteopenic . Limited upright exam. No large volume of free air appreciated. Diffuse gas distention of bowel remains nonspecific for diffuse ileus versus distal obstruction. No significant change compared CT of 05/04/2017. IMPRESSION: Stable gaseous distention of the bowel. Low lung volumes with bibasilar atelectasis Electronically Signed   By: Judie Petit.  Shick M.D.   On: 05/06/2017 15:07     Palma Holter, MD 05/08/2017, 7:44 AM PGY-1, [facility] FPTS Intern pager: [phone], text pages welcome

## 2017-05-08 NOTE — NC FL2 (Signed)
[place] MEDICAID FL2 LEVEL OF CARE SCREENING TOOL     IDENTIFICATION  Patient Name: Sharon Gilmore Birthdate: [birth year] Sex: female Admission Date (Current Location): 05/02/2017  [facility] and [place] Number:  Producer, television/film/video and Address:  [facility] [place], [address] [place], [place] [address]      Provider Number: [id number]  Attending Physician Name and Address:  Moses Manners, MD  Relative Name and Phone Number:       Current Level of Care: Hospital Recommended Level of Care: Skilled Nursing Facility Prior Approval Number:    Date Approved/Denied:   PASRR Number: [id number]  Discharge Plan: SNF    Current Diagnoses: Patient Active Problem List   Diagnosis Date Noted  . SBO (small bowel obstruction) (HCC)   . Atrial fibrillation with rapid ventricular response (HCC)   . Second degree AV block, Mobitz type II   . Colitis   . AKI (acute kidney injury) (HCC)   . Urinary tract infection without hematuria   . Sepsis (HCC) 05/02/2017  . Septic shock (HCC) 05/02/2017  . Lumbar spinal stenosis 04/23/2017  . DOE (dyspnea on exertion) 03/08/2017  . AV block, 2nd degree 03/08/2017  . Essential hypertension 03/08/2017  . Family history of coronary artery disease in brother 03/08/2017  . History of asthma 03/08/2017  . AVB (atrioventricular block) 03/08/2017  . Abdominal pain 01/04/2017  . Spinal stenosis 01/04/2017    Orientation RESPIRATION BLADDER Height & Weight     Self, Time, Situation, Place  O2 (Nasal Canula 2 L) Indwelling catheter Weight: 178 lb 5.6 oz (80.9 kg) Height:   (162.6 cm)  BEHAVIORAL SYMPTOMS/MOOD NEUROLOGICAL BOWEL NUTRITION STATUS   (None)  (None) Incontinent Diet (See discharge summary. Patient pulled out NG tube yesterday and they are slowly progressing. Currently just ice chips.)  AMBULATORY STATUS COMMUNICATION OF NEEDS Skin   Extensive Assist Verbally Bruising, Surgical wounds                    Personal Care Assistance Level of Assistance  Bathing, Dressing Bathing Assistance: Limited assistance   Dressing Assistance: Limited assistance     Functional Limitations Info  Sight, Hearing, Speech Sight Info: Adequate Hearing Info: Adequate Speech Info: Adequate    SPECIAL CARE FACTORS FREQUENCY  PT (By licensed PT), Blood pressure     PT Frequency: 5 x week              Contractures Contractures Info: Not present    Additional Factors Info  Code Status, Allergies Code Status Info: Full Allergies Info: Celebrex (Celecoxib), Codeine, Cymbalta (Duloxetine Hcl), Doxycycline, Meloxicam, Nortriptyline, Percocet (Oxycodone-acetaminophen), Penicillins           Current Medications (05/08/2017):  This is the current hospital active medication list Current Facility-Administered Medications  Medication Dose Route Frequency Provider Last Rate Last Dose  . bisacodyl (DULCOLAX) suppository 10 mg  10 mg Rectal Daily Meuth, Brooke A, PA-C   10 mg at 05/07/17 0935  . chlorhexidine (PERIDEX) 0.12 % solution 15 mL  15 mL Mouth Rinse BID Moses Manners, MD   15 mL at 05/07/17 2141  . ciprofloxacin (CIPRO) IVPB 400 mg  400 mg Intravenous Q12H Marquette Saa, MD   Stopped at 05/07/17 2241  . diltiazem (CARDIZEM) 100 mg in dextrose 5 % 100 mL (1 mg/mL) infusion  5-15 mg/hr Intravenous Titrated Palma Holter, MD 10 mL/hr at 05/08/17 0357 10 mg/hr at 05/08/17 0357  .  fentaNYL (SUBLIMAZE) injection 12.5 mcg  12.5 mcg Intravenous Q6H PRN Lockamy, Timothy, DO   12.5 mcg at 05/07/17 0854  . gabapentin (NEURONTIN) 250 MG/5ML solution 300 mg  300 mg Per Tube QHS Roslynn Amble, MD   300 mg at 05/07/17 2141  . heparin injection 5,000 Units  5,000 Units Subcutaneous Q8H Nelda Bucks, MD   5,000 Units at 05/08/17 [phone]  . ipratropium-albuterol (DUONEB) 0.5-2.5 (3) MG/3ML nebulizer solution 3 mL  3 mL Nebulization Q6H PRN Meuth, Brooke A, PA-C   3 mL at  05/06/17 0852  . MEDLINE mouth rinse  15 mL Mouth Rinse q12n4p Moses Manners, MD   15 mL at 05/07/17 1552  . metroNIDAZOLE (FLAGYL) IVPB 500 mg  500 mg Intravenous Q6H Marquette Saa, MD   Stopped at 05/08/17 [phone]     Discharge Medications: Please see discharge summary for a list of discharge medications.  Relevant Imaging Results:  Relevant Lab Results:   Additional Information SS#: [id number]  Margarito Liner, LCSW

## 2017-05-09 LAB — GLUCOSE, CAPILLARY
GLUCOSE-CAPILLARY: 116 mg/dL — AB (ref 65–99)
GLUCOSE-CAPILLARY: 94 mg/dL (ref 65–99)
Glucose-Capillary: 97 mg/dL (ref 65–99)
Glucose-Capillary: 109 mg/dL — ABNORMAL HIGH (ref 65–99)
Glucose-Capillary: 107 mg/dL — ABNORMAL HIGH (ref 65–99)
Glucose-Capillary: 108 mg/dL — ABNORMAL HIGH (ref 65–99)

## 2017-05-09 LAB — BASIC METABOLIC PANEL
Anion gap: 5 (ref 5–15)
BUN: 5 mg/dL — AB (ref 6–20)
CHLORIDE: 109 mmol/L (ref 101–111)
CO2: 21 mmol/L — AB (ref 22–32)
CREATININE: 0.4 mg/dL — AB (ref 0.44–1.00)
Calcium: 7.3 mg/dL — ABNORMAL LOW (ref 8.9–10.3)
GFR calc Af Amer: >60 mL/min (ref >60)
GFR calc non Af Amer: >60 mL/min (ref >60)
Glucose, Bld: 103 mg/dL — ABNORMAL HIGH (ref 65–99)
POTASSIUM: 3.4 mmol/L — AB (ref 3.5–5.1)
SODIUM: 135 mmol/L (ref 135–145)

## 2017-05-09 LAB — CBC
HEMATOCRIT: 29.9 % — AB (ref 36.0–46.0)
HEMOGLOBIN: 9.6 g/dL — AB (ref 12.0–15.0)
MCH: 27.8 pg (ref 26.0–34.0)
MCHC: 32.1 g/dL (ref 30.0–36.0)
MCV: 86.7 fL (ref 78.0–100.0)
Platelets: 265 10*3/uL (ref 150–400)
RBC: 3.45 MIL/uL — AB (ref 3.87–5.11)
RDW: 17.3 % — ABNORMAL HIGH (ref 11.5–15.5)
WBC: 16.9 10*3/uL — ABNORMAL HIGH (ref 4.0–10.5)

## 2017-05-09 MED ORDER — POTASSIUM CHLORIDE CRYS ER 20 MEQ PO TBCR
40.0000 meq | EXTENDED_RELEASE_TABLET | Freq: Once | ORAL | Status: AC
  Administered 2017-05-09: 40 meq via ORAL
  Filled 2017-05-09: qty 2

## 2017-05-09 MED ORDER — GABAPENTIN 100 MG PO CAPS
100.0000 mg | ORAL_CAPSULE | Freq: Two times a day (BID) | ORAL | Status: DC
  Administered 2017-05-09 – 2017-05-10 (×3): 100 mg via ORAL
  Filled 2017-05-09 (×3): qty 1

## 2017-05-09 MED ORDER — GABAPENTIN 600 MG PO TABS
300.0000 mg | ORAL_TABLET | Freq: Every day | ORAL | Status: DC
  Administered 2017-05-09: 300 mg via ORAL
  Filled 2017-05-09: qty 1

## 2017-05-09 MED ORDER — METRONIDAZOLE 500 MG PO TABS
500.0000 mg | ORAL_TABLET | Freq: Three times a day (TID) | ORAL | Status: DC
  Administered 2017-05-09 – 2017-05-10 (×3): 500 mg via ORAL
  Filled 2017-05-09 (×4): qty 1

## 2017-05-09 MED ORDER — DILTIAZEM HCL 30 MG PO TABS
30.0000 mg | ORAL_TABLET | Freq: Four times a day (QID) | ORAL | Status: DC
  Administered 2017-05-09 – 2017-05-10 (×4): 30 mg via ORAL
  Filled 2017-05-09 (×4): qty 1

## 2017-05-09 MED ORDER — ATORVASTATIN CALCIUM 20 MG PO TABS
20.0000 mg | ORAL_TABLET | Freq: Every day | ORAL | Status: DC
  Administered 2017-05-09 – 2017-05-10 (×2): 20 mg via ORAL
  Filled 2017-05-09 (×2): qty 1

## 2017-05-09 MED ORDER — TRAMADOL HCL 50 MG PO TABS: 50.0000 mg | ORAL_TABLET | Freq: Four times a day (QID) | ORAL | Status: DC

## 2017-05-09 MED ORDER — GABAPENTIN 600 MG PO TABS: 300.0000 mg | ORAL_TABLET | Freq: Three times a day (TID) | ORAL | Status: DC

## 2017-05-09 MED ORDER — ACETAMINOPHEN 325 MG PO TABS
650.0000 mg | ORAL_TABLET | Freq: Four times a day (QID) | ORAL | Status: DC
  Administered 2017-05-09 – 2017-05-10 (×6): 650 mg via ORAL
  Filled 2017-05-09 (×6): qty 2

## 2017-05-09 MED ORDER — CIPROFLOXACIN HCL 500 MG PO TABS
500.0000 mg | ORAL_TABLET | Freq: Two times a day (BID) | ORAL | Status: DC
  Administered 2017-05-09 – 2017-05-10 (×2): 500 mg via ORAL
  Filled 2017-05-09 (×2): qty 1

## 2017-05-09 MED ORDER — DEXTROSE 5 % IV SOLN
5.0000 mg/h | INTRAVENOUS | Status: DC
  Filled 2017-05-09: qty 100

## 2017-05-09 MED ORDER — TRAMADOL HCL 50 MG PO TABS
50.0000 mg | ORAL_TABLET | Freq: Four times a day (QID) | ORAL | Status: DC | PRN
  Administered 2017-05-10 (×3): 50 mg via ORAL
  Filled 2017-05-09 (×3): qty 1

## 2017-05-09 NOTE — Progress Notes (Signed)
Donaciano Eva 9:04 AM  Subjective: Patient doing much better today much more alert and enjoying her last clear liquids particularly Jell-O. Abdomen is still little distended but she is moving her extremities better and her case discussed with her family again and we answered all of their questions  Objective: Vital signs stable afebrile no acute distress abdomen a little distended but soft nontender occasional bowel sounds white count slight decrease BUN okay x-ray probable ileus which we discussed  Assessment: Multiple medical problems including hopefully resolving ischemic colitis  Plan: Will continue to follow continue clear liquids for now but possibly advance tomorrow if continual improvement noted slowly increase activity which should help her ileus and continue to wean pain medicines  Garin Mata E  Pager [phone] After 5PM or if no answer call [phone]

## 2017-05-09 NOTE — Progress Notes (Signed)
FPTS Interim Progress Note  S:Patient resting in bed. Easily awoken. Reports she is having no pain and that she has been trying to get up more today. She says she would prefer to change the gabapentin from 300 mg three times a day because she thinks it makes her too tired. She would like to try a reduced dose during the day and then the same bedtime dose she has been on to help her sleep. She claims it reduces her pain which lets her rest.  O: BP 139/67   Pulse 80   Temp 98 F (36.7 C) (Axillary)   Resp (!) 21   Ht  (1.626 m)   Wt 178 lb 5.6 oz (80.9 kg)   SpO2 96%   BMI 30.61 kg/m   General: NAD, resting, pleasant Cardio: RRR Neuro: Alert and oriented  A/P: Will scheduled gabapentin 100 mg for breakfast and afternoon, with 300 mg nightly. Patient now on oral diltiazem and still in rhythm. Continue to monitor abdominal pain.  Tanicka Bisaillon, [place], DO 05/09/2017, 4:51 PM PGY-1, [facility] Family Medicine Service pager [phone]

## 2017-05-09 NOTE — Progress Notes (Signed)
Family Medicine Teaching Service Daily Progress Note Intern Pager: [phone]  Patient name: Sharon Gilmore Medical record number: [id number] Date of birth: [birth year] Age: 83 y.o. Gender: female  Primary Care Provider: Olivia Canter, MD Consultants: CCM, Urology, General surgery Code Status: Full  Pt Overview and Major Events to Date:  09/16 - admitted for hypotension, abd pain, urosepsis 09/16 - transfer to ICU for refractive hypotension, shock 09/16 - CT - rectosigmoid colitis  09/17 - KUB possible SBO vs ileus 09/18 - Family Medicine resume care 09/19 - Patient transferred to step down 09/21 - NGT removed by patient   Assessment and Plan: Sharon Gilmore is a 83 y.o. female presenting with hypotension, abdominal pain, UA suggestive of urosepsis. PMH is significant for HTN, lumbar stenosis with recent decompression and fusion 04/2017, AV block s/p pacemaker 02/2017, asthma.   Rectosigmoid Colitis, complicated by ileus:  LLQ abd pain, improving. Repeat CT abd/pelvis on 09/18- Colitis. Ileus.  WBC improving slightly. - Tolerating clear liquid diet well - gentle IVF @ 50 cc/hr with plans to discontinue later today if good PO - Ceftaz (9/17-9/18), cipro and flagyl (9/18- ) changed to po from IV - GI consulted: flexible sigmoidoscopy next week if no improvement, cont abx.  - Changed fentanyl 12.5 q6h to po tramadol, tylenol q6 and increased gabapentin to  TID, encourage movement - Daily dulcolax suppositories.  - Keep potassium >4, mag >2; mag 2.0 on 9/20 - WBC elevated to 22> 14.2> 16.4 > 16.9 9/23 - Patient ok to move from step down with po dilt  Hypokalemia: 3.4 today  - given 40 meq of Kdur  Hypoalbuminemia: 1.5 on 09/23. Likely contributing to LE edema due to third spacing. Will continue to monitor.  Hyperventilation, resolved:  Has good O2 sats. CXR on 09/20: Low volume chest with atelectasis and small pleural effusions. - monitor - Wean [place] as able  New  onset AF with RVR: Resolved. CHADS2VASC score of 4 (female, age > 36, HTN). Likely due to high catecholamines during stress from urosepsis. Pacer indicating that this was the sole episode and no anticoagulation indicated - Regular rhythm now while on dilt - if tolerates clear liquid diet, will switch to PO dilt - Will continue to monitor  Urosepsis, resolved: S/p rocephin (9/16-9/17).  Urine culture grew pansensitive Ecoli. cipro and flagyl (9/18-) - Foley re-inserted 09/20 after failed voiding trial> failed voiding trial yesterday (09/22)  - follow blood cultures - NGx5d  Asthma: patient reports no inhalers for several years, also denies URIs or hx of pneumonia. - monitor O2 sats- wean [place] as able- on RA this am - Duonebs and incentive spirometer   Hyponatremia, resolved: 135 today.  Elevated transaminases, resolved: Marland Kitchen AST 222> 33 and ALT 64> 40, WNL 03/13/2017. Elevated LFTs likely 2/2 shock liver with hypotension and lactic acidosis.  - Monitor  AKI, resolved: baseline Cr. 0.5. Cr on admit 2.11 in the setting of hypovolemia 2/2 sepsis (dry mucuous membranes on exam). Cause of AKI likely shock 2/2 sepsis vs urinary retention. Renal U/S neg for hydro. Cr 0.40 on 09/23 - strict I/Os - monitor fluid status - monitor Cr  Hx of spinal surgery: recent spinal surgery with Dr. Venetia Maxon (L2-L5 decompression and fusion) on 04/23/17. Prior to this, lived in independent apartment at [place]. D/c to SNF for rehab. No signs of infection or bleeding over site, patient denies back pain. -monitor on exam - Some increased nerve pain down thigh from recent surgery while in chair, plan to  increase gabapentin and change to TID once taking PO meds  HTN: slightly elevated BP. Last recorded BP of 139/67. home meds are enalapril, HCTZ, lopressor.  -hold home meds   Hx of high degree AV block, s/p pacemaker: pacer placed 03/08/17.   HLD: home med is lipitor . Hold in the setting of elevated transaminases.   -hold home lipitor. Will add   FEN/GI: clear liquid diet  Prophylaxis: SCDs, heparin  Disposition: In step down, pending po dilt then to med-surg   Subjective:  Reports she feels that she is doing better. She slept well last night. She feels a little more bloated today. No abdominal pain. She just had a bm with formed stool.   Objective: Temp:  [97.6 F (36.4 C)-98.2 F (36.8 C)] 98.2 F (36.8 C) (09/23 0400) Pulse Rate:  [75-87] 76 (09/23 0400) Resp:  [18-27] 22 (09/23 0400) BP: (124-159)/(57-75) 124/57 (09/23 0400) SpO2:  [92 %-98 %] 92 % (09/23 0400)  Physical Exam:  General: elderly female lying in bed, NAD Cardiovascular: RRR, no murmur Respiratory: on RA, normal work of breathing. Coarse breath sounds but no crackles noted.  Abdomen: no tenderness to palpation, hypoactive BS, mildly distended, no rebound or guarding Extremities: +1 pitting LE edema  Laboratory:  Recent Labs Lab 05/07/17 0219 05/08/17 0307 05/09/17 0335  WBC 16.4* 18.9* 16.9*  HGB 9.3* 9.7* 9.6*  HCT 29.0* 28.5* 29.9*  PLT 238 247 265    Recent Labs Lab 05/05/17 0254  05/07/17 0219 05/08/17 1041 05/09/17 0335  NA 132*  < > 135 135 135  K 3.6  < > 3.7 3.2* 3.4*  CL 103  < > 109 110 109  CO2 22  < > 21* 20* 21*  BUN 15  < > 8 6 5*  CREATININE 0.56  < > 0.48 0.38* 0.40*  CALCIUM 7.8*  < > 7.5* 7.4* 7.3*  PROT 5.0*  --  4.7* 4.4*  --   BILITOT 0.8  --  0.7 0.5  --   ALKPHOS 103  --  88 71  --   ALT 85*  --  53 40  --   AST 42*  --  37 33  --   GLUCOSE 108*  < > 107* 105* 103*  < > = values in this interval not displayed.  Imaging/Diagnostic Tests:  Dg Abd Portable 2v  Result Date: 05/08/2017 CLINICAL DATA:  83 y/o  F; abdominal distention. EXAM: PORTABLE ABDOMEN - 2 VIEW COMPARISON:  05/04/2017 CT abdomen and pelvis. FINDINGS: Dilated loop of small bowel in the right mid abdomen. No pneumoperitoneum. Featureless distal colon compatible with colitis. Lower lumbar spine posterior  fusion and laminectomy changes. Two lead pacemaker noted. IMPRESSION: Featureless distal colon compatible with colitis as seen on prior CT. Dilated loop of small bowel may represent associated ileus or obstruction. Electronically Signed   By: Mitzi Hansen M.D.   On: 05/08/2017 19:03     Laderius Valbuena, [place], DO 05/09/2017, 7:22 AM PGY-1, [facility] Family Medicine FPTS Intern pager: [phone], text pages welcome

## 2017-05-10 LAB — GLUCOSE, CAPILLARY
GLUCOSE-CAPILLARY: 104 mg/dL — AB (ref 65–99)
Glucose-Capillary: 101 mg/dL — ABNORMAL HIGH (ref 65–99)
Glucose-Capillary: 106 mg/dL — ABNORMAL HIGH (ref 65–99)

## 2017-05-10 LAB — BASIC METABOLIC PANEL
Anion gap: 5 (ref 5–15)
BUN: <5 mg/dL — ABNORMAL LOW (ref 6–20)
CALCIUM: 7.4 mg/dL — AB (ref 8.9–10.3)
CHLORIDE: 110 mmol/L (ref 101–111)
CO2: 20 mmol/L — AB (ref 22–32)
CREATININE: 0.36 mg/dL — AB (ref 0.44–1.00)
GFR calc Af Amer: >60 mL/min (ref >60)
GFR calc non Af Amer: >60 mL/min (ref >60)
GLUCOSE: 103 mg/dL — AB (ref 65–99)
Potassium: 3.6 mmol/L (ref 3.5–5.1)
Sodium: 135 mmol/L (ref 135–145)

## 2017-05-10 LAB — CBC
HCT: 30.1 % — ABNORMAL LOW (ref 36.0–46.0)
Hemoglobin: 10 g/dL — ABNORMAL LOW (ref 12.0–15.0)
MCH: 29.2 pg (ref 26.0–34.0)
MCHC: 33.2 g/dL (ref 30.0–36.0)
MCV: 87.8 fL (ref 78.0–100.0)
PLATELETS: 267 10*3/uL (ref 150–400)
RBC: 3.43 MIL/uL — AB (ref 3.87–5.11)
RDW: 18.3 % — ABNORMAL HIGH (ref 11.5–15.5)
WBC: 16.6 10*3/uL — ABNORMAL HIGH (ref 4.0–10.5)

## 2017-05-10 MED ORDER — SACCHAROMYCES BOULARDII 250 MG PO CAPS: 250.0000 mg | ORAL_CAPSULE | Freq: Two times a day (BID) | ORAL | Status: AC

## 2017-05-10 MED ORDER — GABAPENTIN 600 MG PO TABS: 300.0000 mg | ORAL_TABLET | Freq: Every day | ORAL | Status: DC

## 2017-05-10 MED ORDER — GABAPENTIN 100 MG PO CAPS: 100.0000 mg | ORAL_CAPSULE | Freq: Two times a day (BID) | ORAL | Status: DC

## 2017-05-10 MED ORDER — POTASSIUM CHLORIDE CRYS ER 20 MEQ PO TBCR
30.0000 meq | EXTENDED_RELEASE_TABLET | Freq: Once | ORAL | Status: AC
  Administered 2017-05-10: 30 meq via ORAL
  Filled 2017-05-10: qty 1

## 2017-05-10 MED ORDER — DILTIAZEM HCL ER COATED BEADS 180 MG PO CP24: 180.0000 mg | ORAL_CAPSULE | Freq: Every day | ORAL | 0 refills | Status: DC

## 2017-05-10 MED ORDER — TRAZODONE HCL 50 MG PO TABS: 50.0000 mg | ORAL_TABLET | Freq: Every evening | ORAL | Status: DC | PRN

## 2017-05-10 MED ORDER — TAMSULOSIN HCL 0.4 MG PO CAPS: 0.4000 mg | ORAL_CAPSULE | Freq: Every day | ORAL | Status: DC

## 2017-05-10 MED ORDER — SACCHAROMYCES BOULARDII 250 MG PO CAPS
250.0000 mg | ORAL_CAPSULE | Freq: Two times a day (BID) | ORAL | Status: DC
  Administered 2017-05-10: 250 mg via ORAL
  Filled 2017-05-10: qty 1

## 2017-05-10 MED ORDER — TAMSULOSIN HCL 0.4 MG PO CAPS
0.4000 mg | ORAL_CAPSULE | Freq: Every day | ORAL | Status: DC
  Administered 2017-05-10: 0.4 mg via ORAL
  Filled 2017-05-10: qty 1

## 2017-05-10 MED ORDER — DILTIAZEM HCL ER COATED BEADS 180 MG PO CP24
180.0000 mg | ORAL_CAPSULE | Freq: Every day | ORAL | Status: DC
  Administered 2017-05-10: 180 mg via ORAL
  Filled 2017-05-10: qty 1

## 2017-05-10 MED ORDER — GERHARDT'S BUTT CREAM: 1.0000 "application " | TOPICAL_CREAM | Freq: Four times a day (QID) | CUTANEOUS | Status: AC | PRN

## 2017-05-10 NOTE — Care Management Note (Signed)
Case Management Note  Patient Details  Name: INDICA MARCOTT MRN: [id number] Date of Birth: November 15, 1933  Subjective/Objective:   CM following for progression and d/c planning.                  Action/Plan: 05/10/2017 Call from pt son, Mikel Pyon re concerns re his mother's d/c planned for today. This CM arranged for pt MD to call Mr Raine to discuss pt progression and this CM explained the appeal process to Mr Chabot. This CM then met with family members in the pt room and explained the appeal process. Family to make a decision, they were given another IM letter to review.   Expected Discharge Date:  05/10/17               Expected Discharge Plan:  [place] (From SNF)  In-House Referral:  Clinical Social Work  Discharge planning Services  CM Consult  Post Acute Care Choice:  NA Choice offered to:  NA  DME Arranged:  N/A DME Agency:  NA  HH Arranged:  NA HH Agency:  NA  Status of Service:  Completed, signed off  If discussed at [employer] of Stay Meetings, dates discussed:    Additional Comments:  Adron Bene, RN 05/10/2017, 4:33 PM

## 2017-05-10 NOTE — Progress Notes (Signed)
PT was transferred to this unit from [facility] around 2200. Pt is alert and oriented. She was put on a low bed and on telemetry. Pt is resting comfortably at this time. Denies pain. Will continue to monitor. Call light in reach and bed alarm activated.   Larey Days, RN

## 2017-05-10 NOTE — Progress Notes (Signed)
Family Medicine Teaching Service Daily Progress Note Intern Pager: [phone]  Patient name: Sharon Gilmore Medical record number: [id number] Date of birth: 21-Aug-1933 Age: 83 y.o. Gender: female  Primary Care Provider: Olivia Canter, MD Consultants: CCM, Urology, General surgery Code Status: Full  Pt Overview and Major Events to Date:  09/16 - admitted for hypotension, abd pain, urosepsis 09/16 - transfer to ICU for refractive hypotension, shock 09/16 - CT - rectosigmoid colitis  09/17 - KUB possible SBO vs ileus 09/18 - Family Medicine resume care 09/19 - Patient transferred to step down 09/21 - NGT removed by patient   Assessment and Plan: VIA ROSADO is a 83 y.o. female presenting with hypotension, abdominal pain, UA suggestive of urosepsis. PMH is significant for HTN, lumbar stenosis with recent decompression and fusion 04/2017, AV block s/p pacemaker 02/2017, asthma.   Rectosigmoid Colitis, complicated by ileus:  LLQ abd pain, improving. Repeat CT abd/pelvis on 09/18- Colitis. Ileus.  WBC improving slightly. - Tolerating clear liquid diet well- will give soft diet today - Discontinued IVF @ 50 cc/hr with  good PO - Ceftaz (9/17-9/18), cipro and flagyl (9/18-24 ) 7 day regimen completed and discontinued today - GI consulted: flexible sigmoidoscopy next week if no improvement, cont abx.  - Pain control: tramadol, tylenol q6 and increased gabapentin to 100 mg BID and  QHS, encourage movement - Daily dulcolax suppositories.  - Keep potassium >4, mag >2; mag 2.0 on 9/20 - WBC elevated to 22> 14.2> 16.4 > 16.6 9/24  Hypokalemia: 3.6 today  - given 30 meq of Kdur  Hypoalbuminemia: 1.5 on 09/23. Likely contributing to LE edema due to third spacing. Will continue to monitor.  Hyperventilation, resolved: Has good O2 sats. CXR on 09/20: Low volume chest with atelectasis and small pleural effusions. - monitor - On RA  New onset AF with RVR: Resolved. CHADS2VASC  score of 4 (female, age > 43, HTN). Likely due to high catecholamines during stress from urosepsis. Pacer indicating that this was the sole episode and no anticoagulation indicated - Regular rhythm now while on dilt - Will place on diltiazem CD, and stop tomorrow - Will continue to monitor  Urosepsis, resolved: S/p rocephin (9/16-9/17). Urine culture grew pansensitive Ecoli. cipro and flagyl (9/18-) - Foley re-inserted 09/20 after failed voiding trial> failed voiding trial (09/22)  - follow blood cultures - NGx5d - Will start on flomax 0.4 mg daily - outpatient urology if not resolved  Asthma: patient reports no inhalers for several years, also denies URIs or hx of pneumonia. - monitor O2 sats- on RA this am - Duonebs and incentive spirometer   Hyponatremia, resolved: 135 today.  Elevated transaminases, resolved: Marland Kitchen AST 222> 33 and ALT 64> 40, WNL 03/13/2017. Elevated LFTs likely 2/2 shock liver with hypotension and lactic acidosis.  - Monitor  AKI, resolved: baseline Cr. 0.5. Cr on admit 2.11 in the setting of hypovolemia 2/2 sepsis (dry mucuous membranes on exam). Cause of AKI likely shock 2/2 sepsis vs urinary retention. Renal U/S neg for hydro. Cr 0.36 on 09/23 - strict I/Os - monitor fluid status - monitor Cr  Hx of spinal surgery: recent spinal surgery with Dr. Venetia Maxon (L2-L5 decompression and fusion) on 04/23/17. Prior to this, lived in independent apartment at [place]. D/c to SNF for rehab. No signs of infection or bleeding over site, patient denies back pain. -monitor on exam - Some increased nerve pain down thigh from recent surgery while in chair, plan to increase gabapentin and change to TID  once taking PO meds  HTN: slightly elevated BP. Last recorded BP of 157/65. home meds are enalapril, HCTZ, lopressor.  -hold home meds   Hx of high degree AV block, s/p pacemaker: pacer placed 03/08/17.   HLD: home med is lipitor . Hold in the setting of elevated transaminases.   -Continue home lipitor.   FEN/GI: clear liquid diet  Prophylaxis: SCDs, heparin  Disposition: med-surg, back to SNF when medically able  Subjective:  Patient feeling well this am and requesting soft diet. Says she has had a bm and has no abdominal pain. Says she slept well and is feeling in much better spirits. She does say she feels like her legs are swollen. She also says the gabapentin dosage has helped her pain greatly.  Objective: Temp:  [97.3 F (36.3 C)-98.9 F (37.2 C)] 98.9 F (37.2 C) (09/24 0414) Pulse Rate:  [74-80] 78 (09/24 0414) Resp:  [18-21] 18 (09/24 0414) BP: (139-157)/(65-73) 157/65 (09/24 0414) SpO2:  [96 %-98 %] 96 % (09/24 0414)  Physical Exam:  General: elderly female lying in bed, NAD Cardiovascular: RRR, no murmur Respiratory: CTA BL, on RA, normal work of breathing.  Abdomen: no tenderness to palpation, normoactive BS, mildly distended- less than previously, no rebound or guarding Extremities: +2 pitting BLLE edema  Laboratory:  Recent Labs Lab 05/08/17 0307 05/09/17 0335 05/10/17 0142  WBC 18.9* 16.9* 16.6*  HGB 9.7* 9.6* 10.0*  HCT 28.5* 29.9* 30.1*  PLT 247 265 267    Recent Labs Lab 05/05/17 0254  05/07/17 0219 05/08/17 1041 05/09/17 0335 05/10/17 0142  NA 132*  < > 135 135 135 135  K 3.6  < > 3.7 3.2* 3.4* 3.6  CL 103  < > 109 110 109 110  CO2 22  < > 21* 20* 21* 20*  BUN 15  < > 8 6 5* <5*  CREATININE 0.56  < > 0.48 0.38* 0.40* 0.36*  CALCIUM 7.8*  < > 7.5* 7.4* 7.3* 7.4*  PROT 5.0*  --  4.7* 4.4*  --   --   BILITOT 0.8  --  0.7 0.5  --   --   ALKPHOS 103  --  88 71  --   --   ALT 85*  --  53 40  --   --   AST 42*  --  37 33  --   --   GLUCOSE 108*  < > 107* 105* 103* 103*  < > = values in this interval not displayed.  Imaging/Diagnostic Tests:  Dg Abd Portable 2v  Result Date: 05/08/2017 CLINICAL DATA:  84 y/o  F; abdominal distention. EXAM: PORTABLE ABDOMEN - 2 VIEW COMPARISON:  05/04/2017 CT abdomen and pelvis.  FINDINGS: Dilated loop of small bowel in the right mid abdomen. No pneumoperitoneum. Featureless distal colon compatible with colitis. Lower lumbar spine posterior fusion and laminectomy changes. Two lead pacemaker noted. IMPRESSION: Featureless distal colon compatible with colitis as seen on prior CT. Dilated loop of small bowel may represent associated ileus or obstruction. Electronically Signed   By: Mitzi Hansen M.D.   On: 05/08/2017 19:03     Hilarie Sinha, [place], DO 05/10/2017, 6:41 AM PGY-1, [facility] FPTS Intern pager: [phone], text pages welcome

## 2017-05-10 NOTE — Care Management Important Message (Signed)
Important Message  Patient Details  Name: Sharon Gilmore MRN: [id number] Date of Birth: [birth year]   Medicare Important Message Given:  Yes    [name] 05/10/2017, 9:30 AM

## 2017-05-10 NOTE — Progress Notes (Signed)
Physical Therapy Treatment Patient Details Name: Sharon Gilmore MRN: [id number] DOB: 30-Apr-1934 Today's Date: 05/10/2017    History of Present Illness Patient is a 83 y/o female with recent decompression and fusion L2-5 on 9/7 presents from rehab with abdominal pain, nausea, weakness. Admitted with urosepsis. PMH includes deperssion, erythema nodosum, pacemaker, swallowing disorder.    PT Comments    Continuing work on functional mobility and activity tolerance;  Presents still with generalized weakness, overall fatigue, limiting progressive amb and transfers; Noting improvements in activity tolerance, as she was able to perform 4 sit<>stand transfers before working on in-room ambulation; continue to recommend SNF stay to maximize independence and safety with mobility   Follow Up Recommendations  SNF     Equipment Recommendations  None recommended by PT    Recommendations for Other Services       Precautions / Restrictions Precautions Precautions: Back;Fall Precaution Booklet Issued: No Precaution Comments: Able to recall 2/3 precautions; NG tube to suction Required Braces or Orthoses: Spinal Brace Spinal Brace: Lumbar corset;Applied in sitting position Restrictions Other Position/Activity Restrictions: pacemaker ~1 month ago    Mobility  Bed Mobility Overal bed mobility: Needs Assistance Bed Mobility: Rolling Rolling: Min assist Sidelying to sit: Min assist       General bed mobility comments: Pt needed min assist for LES and for elevation of trunk.  Needed assist to scoot out and steayding assist at EOB.  Cues to self-monitor for activity tolerance  Transfers Overall transfer level: Needs assistance Equipment used: Rolling walker (2 wheeled) Transfers: Sit to/from Stand Sit to Stand: Mod assist;+2 safety/equipment         General transfer comment: Cues for hand placement; Heavy mod assist to power up and get to fully upright standing; Stood at least 4  times  Ambulation/Gait Ambulation/Gait assistance: Architect (Feet): 5 Feet Assistive device: Rolling walker (2 wheeled) Gait Pattern/deviations: Decreased step length - right;Decreased step length - left;Decreased stride length     General Gait Details: Cues to self-monitor for activity tolerance; chair pulled behind to boost confidence; walked 5 feet with max encouragement; fatigued post multiple transfers   Stairs            Wheelchair Mobility    Modified Rankin (Stroke Patients Only)       Balance     Sitting balance-Leahy Scale: Fair       Standing balance-Leahy Scale: Poor Standing balance comment: heavy reliance on UES for support                            Cognition Arousal/Alertness: Awake/alert Behavior During Therapy: WFL for tasks assessed/performed Overall Cognitive Status: Within Functional Limits for tasks assessed                                        Exercises      General Comments        Pertinent Vitals/Pain Pain Assessment: 0-10 Pain Score: 1  Pain Location: proximal LLE Pain Descriptors / Indicators: Sore;Aching Pain Intervention(s): Monitored during session    Home Living                      Prior Function            PT Goals (current goals can now be found in the care plan section) Acute  Rehab PT Goals Patient Stated Goal: to get better PT Goal Formulation: With patient Time For Goal Achievement: 05/18/17 Potential to Achieve Goals: Fair Progress towards PT goals: Progressing toward goals    Frequency    Min 3X/week      PT Plan Current plan remains appropriate    Co-evaluation              AM-PAC PT "6 Clicks" Daily Activity  Outcome Measure  Difficulty turning over in bed (including adjusting bedclothes, sheets and blankets)?: Unable Difficulty moving from lying on back to sitting on the side of the bed? : Unable Difficulty sitting down on  and standing up from a chair with arms (e.g., wheelchair, bedside commode, etc,.)?: Unable Help needed moving to and from a bed to chair (including a wheelchair)?: A Lot Help needed walking in hospital room?: A Lot Help needed climbing 3-5 steps with a railing? : Total 6 Click Score: 8    End of Session Equipment Utilized During Treatment: Gait belt;Back brace Activity Tolerance: Patient tolerated treatment well (though still fatigued) Patient left: in chair;with call bell/phone within reach Nurse Communication: Mobility status PT Visit Diagnosis: Pain;Other abnormalities of gait and mobility (R26.89);Muscle weakness (generalized) (M62.81)     Time: 6962-9528 PT Time Calculation (min) (ACUTE ONLY): 42 min  Charges:  $Gait Training: 8-22 mins $Therapeutic Activity: 23-37 mins                    G Codes:       Sharon Gilmore, PT  Acute Rehabilitation Services Pager [phone] Office [phone]    Sharon Gilmore 05/10/2017, 1:55 PM

## 2017-05-10 NOTE — Progress Notes (Signed)
Addendum to earlier note:  Pt's lingering (albeit improved) leukocytosis noted.  Afebrile.  Suspect this may reflect residual (noninfectious) inflammation.  Discussed w/ FP Res my opinion that antbx are not needed at this point from the colitis perspective, so I do think it's appropriate to D/C unless other indication present.  Since CT did show stool in prox colon, we will need to watch for constipation now that Bisacodyl is d/c'd.  Pt might be a good candidate for Miralax for maintenance, once the diarr has resolved.  Florencia Reasons, M.D. Pager [phone] If no answer or after 5 PM call [phone]

## 2017-05-10 NOTE — Progress Notes (Signed)
Pt feeling ok, but still having liquid brown stools (several in past 24 hrs, per pt).  Pt is on antbx and daily bisacodyl suppository.  I cannot see where a C diff has been checked, and the system won't let me order one in view of recent bisacodyl.  Pt denies abd pain.  CT did show left-sided colitis and pericolonic inflammatory change.  IMPR:  I think the most likely scenario is ischemic colitis, which is in the resolving phase.  RECOMM:  1.  D/C bisacodyl for now, to see if diarr improves (ordered) 2.  If diarr doesn't improve off bisacodyl, check C. Diff 3.  Have ordered FloraStor probiotic to hopefully decrease risk of C diff, taking into acct pt's inpt status and antbx exposure, both of which are risk factors for C diff 4.  I do not feel that antbx are needed for the colitis, and would consider stopping the Cipro and Flagyl unless they are needed for some other indication.  Please call me prn to discuss.   Florencia Reasons, M.D. Pager [phone] If no answer or after 5 PM call [phone]

## 2017-05-10 NOTE — Progress Notes (Addendum)
Telephone report called and given to [place], RN @ [facility]. All questions answered and addressed. Clinical social worker is setting up transportation via [facility]. Pt & family updated. Will continue to monitor until departure.

## 2017-05-10 NOTE — Clinical Social Work Note (Signed)
Patient medically stable for discharge back to [facility] skilled facility to continue rehab. Patient and family initially had concerns about today's discharge, however after talking with MD and nurse case manager, patient and family decided to discharge. Admissions director notified and d/c clinicals transmitted to facility. Ms. Holladay will be transported by ambulance. CSW signing off, however please reconsult if any other SW intervention services needed before patient leaves the hospital.  Genelle Bal, MSW, LCSW Licensed Clinical Social Worker Clinical Social Work Department [employer] [phone]

## 2017-05-11 LAB — GLUCOSE, CAPILLARY
GLUCOSE-CAPILLARY: 115 mg/dL — AB (ref 65–99)
Glucose-Capillary: 34 mg/dL — CL (ref 65–99)

## 2017-05-15 ENCOUNTER — Inpatient Hospital Stay (HOSPITAL_COMMUNITY)
Admission: EM | Admit: 2017-05-15 | Discharge: 2017-05-18 | DRG: 357 | Disposition: A | Discharge status: Skilled Nursing Facility | Payer: Medicare Other | Attending: Family Medicine | Admitting: Family Medicine

## 2017-05-15 ENCOUNTER — Emergency Department (HOSPITAL_COMMUNITY): Payer: Medicare Other

## 2017-05-15 ENCOUNTER — Encounter (HOSPITAL_COMMUNITY): Payer: Self-pay

## 2017-05-15 DIAGNOSIS — E876 Hypokalemia: Secondary | ICD-10-CM | POA: Diagnosis present

## 2017-05-15 DIAGNOSIS — K622 Anal prolapse: Secondary | ICD-10-CM | POA: Diagnosis not present

## 2017-05-15 DIAGNOSIS — K629 Disease of anus and rectum, unspecified: Secondary | ICD-10-CM | POA: Diagnosis present

## 2017-05-15 DIAGNOSIS — Z79899 Other long term (current) drug therapy: Secondary | ICD-10-CM

## 2017-05-15 DIAGNOSIS — Z7982 Long term (current) use of aspirin: Secondary | ICD-10-CM

## 2017-05-15 DIAGNOSIS — I38 Endocarditis, valve unspecified: Secondary | ICD-10-CM | POA: Diagnosis present

## 2017-05-15 DIAGNOSIS — Z886 Allergy status to analgesic agent status: Secondary | ICD-10-CM

## 2017-05-15 DIAGNOSIS — Z981 Arthrodesis status: Secondary | ICD-10-CM

## 2017-05-15 DIAGNOSIS — I10 Essential (primary) hypertension: Secondary | ICD-10-CM | POA: Diagnosis present

## 2017-05-15 DIAGNOSIS — R109 Unspecified abdominal pain: Secondary | ICD-10-CM | POA: Diagnosis present

## 2017-05-15 DIAGNOSIS — Z23 Encounter for immunization: Secondary | ICD-10-CM

## 2017-05-15 DIAGNOSIS — F329 Major depressive disorder, single episode, unspecified: Secondary | ICD-10-CM | POA: Diagnosis present

## 2017-05-15 DIAGNOSIS — Z950 Presence of cardiac pacemaker: Secondary | ICD-10-CM

## 2017-05-15 DIAGNOSIS — I208 Other forms of angina pectoris: Secondary | ICD-10-CM

## 2017-05-15 DIAGNOSIS — G709 Myoneural disorder, unspecified: Secondary | ICD-10-CM | POA: Diagnosis present

## 2017-05-15 DIAGNOSIS — Z888 Allergy status to other drugs, medicaments and biological substances status: Secondary | ICD-10-CM

## 2017-05-15 DIAGNOSIS — K559 Vascular disorder of intestine, unspecified: Secondary | ICD-10-CM | POA: Diagnosis present

## 2017-05-15 DIAGNOSIS — I4891 Unspecified atrial fibrillation: Secondary | ICD-10-CM | POA: Diagnosis present

## 2017-05-15 DIAGNOSIS — Z87891 Personal history of nicotine dependence: Secondary | ICD-10-CM

## 2017-05-15 DIAGNOSIS — Z9049 Acquired absence of other specified parts of digestive tract: Secondary | ICD-10-CM

## 2017-05-15 DIAGNOSIS — I248 Other forms of acute ischemic heart disease: Secondary | ICD-10-CM | POA: Diagnosis present

## 2017-05-15 DIAGNOSIS — Z881 Allergy status to other antibiotic agents status: Secondary | ICD-10-CM

## 2017-05-15 DIAGNOSIS — N289 Disorder of kidney and ureter, unspecified: Secondary | ICD-10-CM | POA: Diagnosis present

## 2017-05-15 DIAGNOSIS — J45909 Unspecified asthma, uncomplicated: Secondary | ICD-10-CM | POA: Diagnosis present

## 2017-05-15 DIAGNOSIS — Z885 Allergy status to narcotic agent status: Secondary | ICD-10-CM

## 2017-05-15 DIAGNOSIS — M1990 Unspecified osteoarthritis, unspecified site: Secondary | ICD-10-CM | POA: Diagnosis present

## 2017-05-15 DIAGNOSIS — Z880 Allergy status to penicillin: Secondary | ICD-10-CM

## 2017-05-15 DIAGNOSIS — E785 Hyperlipidemia, unspecified: Secondary | ICD-10-CM | POA: Diagnosis present

## 2017-05-15 DIAGNOSIS — E8809 Other disorders of plasma-protein metabolism, not elsewhere classified: Secondary | ICD-10-CM | POA: Diagnosis present

## 2017-05-15 DIAGNOSIS — K633 Ulcer of intestine: Secondary | ICD-10-CM | POA: Diagnosis present

## 2017-05-15 MED ORDER — ASPIRIN 81 MG PO CHEW
324.0000 mg | CHEWABLE_TABLET | Freq: Once | ORAL | Status: AC
  Administered 2017-05-15: 324 mg via ORAL
  Filled 2017-05-15: qty 4

## 2017-05-15 NOTE — ED Notes (Signed)
X-ray at bedside

## 2017-05-15 NOTE — ED Notes (Signed)
ED Provider at bedside. 

## 2017-05-15 NOTE — ED Triage Notes (Signed)
Pt presents to the ed with ems from [facility]. She is there for rehab from a spinal fusion she had done.  Pt was found to have a prolapsed rectum by staff there. Pt denies any abdominal pain or rectal bleeding.  She also complains of recent pitting edema in her legs over the last few days with some shortness of breath.

## 2017-05-15 NOTE — ED Provider Notes (Addendum)
[facility]-EMERGENCY DEPT Provider Note   CSN: [id number] Arrival date & time: 05/15/17  1812     History   Chief Complaint Chief Complaint  Patient presents with  . Prolapsed Rectum    HPI Sharon Gilmore is a 83 y.o. female.  HPI 83 y/o with PMH significant for known AV block s/p pacemaker placement, HTN, lumbar stenosis with recent fusion on 04/23/17 and subsequent admission for sepsis and ileus comes in with cc of rectal prolapses. Pt has had problems with bowel regimen the last several days. Today, after a BM the SNF noted suspected rectal prolapse and sent pt to the Er. Pt has had persistent abd fullness, but she has no new abd pain. Pt hasnt had a normal BM is weeks.  Past Medical History:  Diagnosis Date  . Abnormal barium swallow 09/1999  . Arthritis   . Asthma   . Carpal tunnel syndrome   . Depression   . Erythema nodosum   . H/O Doppler ultrasound    LEFT LEG 11/1997  . Hypertension   . Impaired fasting glucose   . Presence of permanent cardiac pacemaker    [employer]  . Psoriasis   . Rosacea   . Seizures (HCC) 1980  . Swallowing disorder   . Systolic murmur     Patient Active Problem List   Diagnosis Date Noted  . SBO (small bowel obstruction) (HCC)   . Atrial fibrillation with rapid ventricular response (HCC)   . Second degree AV block, Mobitz type II   . Colitis   . AKI (acute kidney injury) (HCC)   . Urinary tract infection without hematuria   . Sepsis (HCC) 05/02/2017  . Septic shock (HCC) 05/02/2017  . Lumbar spinal stenosis 04/23/2017  . DOE (dyspnea on exertion) 03/08/2017  . AV block, 2nd degree 03/08/2017  . Essential hypertension 03/08/2017  . Family history of coronary artery disease in brother 03/08/2017  . History of asthma 03/08/2017  . AVB (atrioventricular block) 03/08/2017  . Abdominal pain 01/04/2017  . Spinal stenosis 01/04/2017    Past Surgical History:  Procedure Laterality Date  . BILATERAL CARPAL TUNNEL RELEASE    .  CHOLECYSTECTOMY    . COLONOSCOPY  2004  . INCISIONAL HERNIA REPAIR    . LEFT HEART CATH AND CORONARY ANGIOGRAPHY N/A 03/15/2017   Procedure: Left Heart Cath and Coronary Angiography;  Surgeon: Runell Gess, MD;  Location: [facility] INVASIVE CV LAB;  Service: Cardiovascular;  Laterality: N/A;  . PACEMAKER IMPLANT N/A 03/09/2017   Procedure: Pacemaker Implant;  Surgeon: Hillis Range, MD;  Location: [facility] INVASIVE CV LAB;  Service: Cardiovascular;  Laterality: N/A;  . TONSILLECTOMY      OB History    No data available       Home Medications    Prior to Admission medications   Medication Sig Start Date End Date Taking? Authorizing Provider  acetaminophen (TYLENOL) 500 MG tablet Take 1,000 mg by mouth 3 (three) times daily.   Yes [provider]  antiseptic oral rinse (BIOTENE) LIQD 15 mLs by Mouth Rinse route 3 (three) times daily before meals. spray   Yes [provider]  aspirin EC 81 MG tablet Take 81 mg by mouth at bedtime.    Yes [provider]  cyclobenzaprine (FLEXERIL) 5 MG tablet Take 5 mg by mouth 3 (three) times daily as needed for muscle spasms.   Yes [provider]  diltiazem (CARDIZEM CD) 180 MG 24 hr capsule Take 1 capsule (180 mg total)  by mouth daily. 05/11/17  Yes Garth Bigness, MD  gabapentin (NEURONTIN) 100 MG capsule Take 1 capsule (100 mg total) by mouth 2 (two) times daily at 10 am and 4 pm. Patient taking differently: Take 100-300 mg by mouth See admin instructions. Take 100 mg at 10:00 am and 16:00  and 300 mg at bedtime 05/10/17  Yes Garth Bigness, MD  Hydrocortisone (GERHARDT'S BUTT CREAM) CREA Apply 1 application topically 4 (four) times daily as needed for irritation. 05/10/17  Yes Garth Bigness, MD  hydroxypropyl methylcellulose / hypromellose (ISOPTO TEARS / GONIOVISC) 2.5 % ophthalmic solution Place 1 drop into both eyes 4 (four) times daily as needed for dry eyes.   Yes [provider]   ipratropium-albuterol (DUONEB) 0.5-2.5 (3) MG/3ML SOLN Take 3 mLs by nebulization See admin instructions. For five days started 05/12/2017   Yes [provider]  nystatin (MYCOSTATIN) 100000 UNIT/ML suspension Take 5 mLs by mouth 3 (three) times daily. For seven days starting 05/15/2017   Yes [provider]  saccharomyces boulardii (FLORASTOR) 250 MG capsule Take 1 capsule (250 mg total) by mouth 2 (two) times daily. 05/10/17  Yes Garth Bigness, MD  tamsulosin [facility]) 0.4 MG CAPS capsule Take 1 capsule (0.4 mg total) by mouth daily after breakfast. 05/11/17  Yes Garth Bigness, MD  traZODone (DESYREL) 50 MG tablet Take 1 tablet (50 mg total) by mouth at bedtime as needed for sleep. 05/10/17  Yes Garth Bigness, MD  Cholecalciferol (VITAMIN D3) 2000 units TABS Take 2,000 Units by mouth daily.     [provider]  gabapentin (NEURONTIN) 600 MG tablet Take 0.5 tablets (300 mg total) by mouth at bedtime. Patient not taking: Reported on 05/15/2017 05/10/17   Garth Bigness, MD  hydroxypropyl methylcellulose / hypromellose (ISOPTO TEARS / GONIOVISC) 2.5 % ophthalmic solution Place 1 drop into both eyes 4 (four) times daily as needed for dry eyes.    [provider]  traMADol (ULTRAM) 50 MG tablet Take 1 tablet (50 mg total) by mouth every 6 (six) hours as needed for moderate pain. Patient not taking: Reported on 05/15/2017 04/27/17   Maeola Harman, MD    Family History Family History  Problem Relation Age of Onset  . CVA Mother   . Stroke Mother   . Parkinson's disease Father   . Leukemia Brother   . CVA Maternal Grandmother   . Stroke Maternal Grandmother   . Peripheral vascular disease Maternal Grandfather   . Breast cancer Paternal Grandmother   . Diabetes Paternal Grandmother   . CVA Paternal Grandfather   . Stroke Paternal Grandfather   . Pancreatic cancer Paternal Aunt   . Stroke Paternal Aunt   . Lung cancer Paternal Uncle   .  Pancreatic cancer Brother   . Coronary artery disease Brother     Social History Social History  Substance Use Topics  . Smoking status: Former Smoker    Quit date: 1982  . Smokeless tobacco: Never Used  . Alcohol use Yes     Comment: 1-2 times per week     Allergies   Celebrex [celecoxib]; Codeine; Cymbalta [duloxetine hcl]; Doxycycline; Meloxicam; Nortriptyline; Percocet [oxycodone-acetaminophen]; and Penicillins   Review of Systems Review of Systems  Constitutional: Positive for activity change.  Gastrointestinal: Negative for abdominal pain, anal bleeding and rectal pain.  All other systems reviewed and are negative.    Physical Exam Updated Vital Signs BP (!) 150/76   Pulse 100   Temp 99.7 F (37.6 C) (Oral)  Resp (!) 28   Wt 82.1 kg (181 lb)   SpO2 96%   BMI 31.07 kg/m   Physical Exam  Constitutional: She is oriented to person, place, and time. She appears well-developed.  HENT:  Head: Normocephalic and atraumatic.  Eyes: EOM are normal.  Neck: Normal range of motion. Neck supple.  Cardiovascular: Normal rate.   Pulmonary/Chest: Effort normal.  Abdominal: Soft. Bowel sounds are normal.   Hyperpigmented, sting like tissue projecting out of the rectum. No increase in content descending with straining. The tissue is dry, there is no peristalsis.  Neurological: She is alert and oriented to person, place, and time.  Skin: Skin is warm and dry.  Nursing note and vitals reviewed.          ED Treatments / Results  Labs (all labs ordered are listed, but only abnormal results are displayed) Labs Reviewed  CBC - Abnormal; Notable for the following:       Result Value   RBC 3.48 (*)    Hemoglobin 9.7 (*)    HCT 30.4 (*)    RDW 18.5 (*)    All other components within normal limits  I-STAT CHEM 8, ED - Abnormal; Notable for the following:    Potassium 3.2 (*)    Chloride 98 (*)    BUN <3 (*)    Creatinine, Ser 0.30 (*)    Calcium, Ion 1.05 (*)     Hemoglobin 9.5 (*)    HCT 28.0 (*)    All other components within normal limits  I-STAT TROPONIN, ED    EKG  EKG Interpretation  Date/Time:  Saturday May 15 2017 18:21:44 EDT Ventricular Rate:  96 PR Interval:    QRS Duration: 100 QT Interval:  428 QTC Calculation: 541 R Axis:   67 Text Interpretation:  Failure to sense and/or capture (?magnet) No further analysis attempted due to paced rhythm twi in the infeiror and lateral leads new Confirmed by Derwood Kaplan ([id number]) on 05/15/2017 8:06:22 PM       Radiology Dg Chest Port 1 View  Result Date: 05/15/2017 CLINICAL DATA:  Shortness of breath. EXAM: PORTABLE CHEST 1 VIEW COMPARISON:  Chest from abdominal radiographs 05/06/2017 FINDINGS: Left-sided pacemaker remains in place. Unchanged heart size and mediastinal contours with tortuous thoracic aorta. Low lung volumes persist. Persistent left lower lobe atelectasis and probable effusion. Slight improved right lung base aeration. No new consolidation, pulmonary edema or pneumothorax. IMPRESSION: Persistent low lung volumes with left lung base atelectasis and probable small effusion. Electronically Signed   By: Rubye Oaks M.D.   On: 05/15/2017 23:55   Dg Abd Portable 1 View  Result Date: 05/16/2017 CLINICAL DATA:  Concern for small bowel obstruction. Prolapsed rectum noted staff at rehab center. EXAM: PORTABLE ABDOMEN - 1 VIEW COMPARISON:  Most recent radiographs 05/08/2017. Most recent CT 05/04/2017 FINDINGS: Increased right-sided colonic stool from prior exams. Similar gaseous distention of transverse and descending colon and small bowel. No evidence of free air on single supine view, upper abdomen not included. Posterior lumbar fusion again seen. IMPRESSION: Increased stool in the right colon from prior, otherwise unchanged bowel gas pattern with mild gaseous distension of distal colon and small bowel. Suspect slow transit. No specific findings to suggest obstruction on the  current exam. Electronically Signed   By: Rubye Oaks M.D.   On: 05/16/2017 00:00    Procedures Debridement Date/Time: 05/15/2017 9:00 PM Performed by: Derwood Kaplan Authorized by: Derwood Kaplan  Consent: Verbal consent obtained. Risks and  benefits: risks, benefits and alternatives were discussed Consent given by: patient Patient understanding: patient states understanding of the procedure being performed Imaging studies: imaging studies available Required items: required blood products, implants, devices, and special equipment available Patient identity confirmed: arm band Time out: Immediately prior to procedure a "time out" was called to verify the correct patient, procedure, equipment, support staff and site/side marked as required. Local anesthesia used: no  Anesthesia: Local anesthesia used: no  Sedation: Patient sedated: no Patient tolerance: Patient tolerated the procedure well with no immediate complications Comments: Herniated tissue was resected. Slimy, green discharge noted. No bleeding. The remainder of tissue reduced by itself into the rectal cavity. Post procedure no abd tenderness. Xrays ordered post procedure.    (including critical care time)  Medications Ordered in ED Medications  aspirin chewable tablet 324 mg (324 mg Oral Given 05/15/17 2356)     Initial Impression / Assessment and Plan / ED Course  I have reviewed the triage vital signs and the nursing notes.  Pertinent labs & imaging results that were available during my care of the patient were reviewed by me and considered in my medical decision making (see chart for details).  Clinical Course as of May 16 28  Sat May 15, 2017  2043 Spoke with Dr. Jesusita Oka, General Surgery. We discussed patient's presenting symptoms, protrusion of tissues that is dry and hyperpigmented and no associated pain. We discussed recent admission for sepsis and post op ileus with irregular BM and constipation.  Dr. Rayburn Ma called me back after reviewing the pictures I had put on EPIC. He doesn't think that the tissue is intestinal content or rectal prolapses. He advised I cut the tissue.  Recommendations from Surgeon discussed with the patient and family and they have agreed with the plan.  [AN]  2246 Pt tolerated the procedure well. When I cut the tissue, there was some slimy material that came out. Prior to cutting the tissue, I ensured there was no blood supply or peristalsis. No bleeding appreciated due to the procedure. I called Dr. Rayburn Ma about my findings of slimy discharge, and he thinks it would be a good idea to talk to GI to see if pt needs to stay for scope. Pt appears to have seen [facility] during the recent admission, and they are on call.  The tissue protruding out could have been mesenteric fat????  [AN]  2335 [facility] recommends admission, they will evaluate the patient further.   [AN]    Clinical Course User Index [AN] Derwood Kaplan, MD    Pt comes in with tissue prolapsing from the rectum. I am a bit concerned, that pt is having intestinal content protruding out, other possibilities include internal hernia protruding out. Pt has hx of severe ileus and colitis, so more likely possibility is that this could be some mucosal sloughing.  More alarming is the fact that the tissue is dry and hyperpigmented - which makes the likelihood of intestinal content low. Additionally, I didn't appreciate any peristalsis or blood supply.  Pt has no abdominal tenderness, which is reassuring. Tissue placed in wet gauze. Surgery consulted.   12:29 AM Pt informed about the plan to admit, and she started c/o of chest pain. Pt feels a little short of breath. Pt's EKG has new TWI in the inferior and lateral leads. Cardiac enzymes ordered. We will give her aspirin now. Abd exam is still benign. Baseline Xrays ordered.  Chart review shows cath just recently that showed the following  impression:  IMPRESSION: Essentially normal coronary arteries with a 60% mid LAD after a moderate size diagonal branch probably of no hemodynamic significance. The LVEDP was mildly elevated as was her blood pressure. I suspect her mildly elevated troponin was from demand ischemia and hypertension. Medical therapy will be recommended. The sheath was removed and a TR band was placed on the right wrist to achieve patent hemostasis. The patient left the lab in stable condition.  Final Clinical Impressions(s) / ED Diagnoses   Final diagnoses:  Angina at rest [facility])  Anal prolapse    New Prescriptions New Prescriptions   No medications on file     Derwood Kaplan, MD 05/15/17 2248    Derwood Kaplan, MD 05/16/17 0001    Derwood Kaplan, MD 05/16/17 4332    Derwood Kaplan, MD 05/17/17 1647

## 2017-05-16 ENCOUNTER — Inpatient Hospital Stay (HOSPITAL_COMMUNITY): Payer: Medicare Other

## 2017-05-16 DIAGNOSIS — Z886 Allergy status to analgesic agent status: Secondary | ICD-10-CM | POA: Diagnosis not present

## 2017-05-16 DIAGNOSIS — Z9049 Acquired absence of other specified parts of digestive tract: Secondary | ICD-10-CM | POA: Diagnosis not present

## 2017-05-16 DIAGNOSIS — E785 Hyperlipidemia, unspecified: Secondary | ICD-10-CM | POA: Diagnosis present

## 2017-05-16 DIAGNOSIS — Z87891 Personal history of nicotine dependence: Secondary | ICD-10-CM | POA: Diagnosis not present

## 2017-05-16 DIAGNOSIS — I4891 Unspecified atrial fibrillation: Secondary | ICD-10-CM | POA: Diagnosis present

## 2017-05-16 DIAGNOSIS — N289 Disorder of kidney and ureter, unspecified: Secondary | ICD-10-CM | POA: Diagnosis present

## 2017-05-16 DIAGNOSIS — Z950 Presence of cardiac pacemaker: Secondary | ICD-10-CM | POA: Diagnosis not present

## 2017-05-16 DIAGNOSIS — G709 Myoneural disorder, unspecified: Secondary | ICD-10-CM | POA: Diagnosis present

## 2017-05-16 DIAGNOSIS — Z881 Allergy status to other antibiotic agents status: Secondary | ICD-10-CM | POA: Diagnosis not present

## 2017-05-16 DIAGNOSIS — K633 Ulcer of intestine: Secondary | ICD-10-CM | POA: Diagnosis present

## 2017-05-16 DIAGNOSIS — I10 Essential (primary) hypertension: Secondary | ICD-10-CM | POA: Diagnosis present

## 2017-05-16 DIAGNOSIS — Z888 Allergy status to other drugs, medicaments and biological substances status: Secondary | ICD-10-CM | POA: Diagnosis not present

## 2017-05-16 DIAGNOSIS — K559 Vascular disorder of intestine, unspecified: Secondary | ICD-10-CM | POA: Diagnosis present

## 2017-05-16 DIAGNOSIS — Z885 Allergy status to narcotic agent status: Secondary | ICD-10-CM | POA: Diagnosis not present

## 2017-05-16 DIAGNOSIS — I208 Other forms of angina pectoris: Secondary | ICD-10-CM | POA: Diagnosis not present

## 2017-05-16 DIAGNOSIS — Z880 Allergy status to penicillin: Secondary | ICD-10-CM | POA: Diagnosis not present

## 2017-05-16 DIAGNOSIS — K622 Anal prolapse: Secondary | ICD-10-CM | POA: Diagnosis present

## 2017-05-16 DIAGNOSIS — M1990 Unspecified osteoarthritis, unspecified site: Secondary | ICD-10-CM | POA: Diagnosis present

## 2017-05-16 DIAGNOSIS — J45909 Unspecified asthma, uncomplicated: Secondary | ICD-10-CM | POA: Diagnosis present

## 2017-05-16 DIAGNOSIS — E8809 Other disorders of plasma-protein metabolism, not elsewhere classified: Secondary | ICD-10-CM | POA: Diagnosis present

## 2017-05-16 DIAGNOSIS — F329 Major depressive disorder, single episode, unspecified: Secondary | ICD-10-CM | POA: Diagnosis present

## 2017-05-16 DIAGNOSIS — K629 Disease of anus and rectum, unspecified: Secondary | ICD-10-CM | POA: Diagnosis present

## 2017-05-16 DIAGNOSIS — I248 Other forms of acute ischemic heart disease: Secondary | ICD-10-CM | POA: Diagnosis present

## 2017-05-16 DIAGNOSIS — I38 Endocarditis, valve unspecified: Secondary | ICD-10-CM | POA: Diagnosis present

## 2017-05-16 DIAGNOSIS — Z23 Encounter for immunization: Secondary | ICD-10-CM | POA: Diagnosis present

## 2017-05-16 DIAGNOSIS — E876 Hypokalemia: Secondary | ICD-10-CM | POA: Diagnosis present

## 2017-05-16 LAB — COMPREHENSIVE METABOLIC PANEL
ALBUMIN: 1.7 g/dL — AB (ref 3.5–5.0)
ALK PHOS: 71 U/L (ref 38–126)
ALT: 18 U/L (ref 14–54)
AST: 26 U/L (ref 15–41)
Anion gap: 7 (ref 5–15)
CALCIUM: 7.5 mg/dL — AB (ref 8.9–10.3)
CO2: 26 mmol/L (ref 22–32)
CREATININE: 0.35 mg/dL — AB (ref 0.44–1.00)
Chloride: 101 mmol/L (ref 101–111)
GFR calc Af Amer: >60 mL/min (ref >60)
GFR calc non Af Amer: >60 mL/min (ref >60)
GLUCOSE: 90 mg/dL (ref 65–99)
Potassium: 3.3 mmol/L — ABNORMAL LOW (ref 3.5–5.1)
SODIUM: 134 mmol/L — AB (ref 135–145)
Total Bilirubin: 0.7 mg/dL (ref 0.3–1.2)
Total Protein: 4.9 g/dL — ABNORMAL LOW (ref 6.5–8.1)

## 2017-05-16 LAB — I-STAT CHEM 8, ED
BUN: <3 mg/dL — ABNORMAL LOW (ref 6–20)
CHLORIDE: 98 mmol/L — AB (ref 101–111)
CREATININE: 0.3 mg/dL — AB (ref 0.44–1.00)
Calcium, Ion: 1.05 mmol/L — ABNORMAL LOW (ref 1.15–1.40)
GLUCOSE: 96 mg/dL (ref 65–99)
HEMATOCRIT: 28 % — AB (ref 36.0–46.0)
HEMOGLOBIN: 9.5 g/dL — AB (ref 12.0–15.0)
POTASSIUM: 3.2 mmol/L — AB (ref 3.5–5.1)
Sodium: 137 mmol/L (ref 135–145)
TCO2: 29 mmol/L (ref 22–32)

## 2017-05-16 LAB — CBC
HCT: 30.4 % — ABNORMAL LOW (ref 36.0–46.0)
HEMOGLOBIN: 9.7 g/dL — AB (ref 12.0–15.0)
MCH: 27.9 pg (ref 26.0–34.0)
MCHC: 31.9 g/dL (ref 30.0–36.0)
MCV: 87.4 fL (ref 78.0–100.0)
Platelets: 324 10*3/uL (ref 150–400)
RBC: 3.48 MIL/uL — AB (ref 3.87–5.11)
RDW: 18.5 % — ABNORMAL HIGH (ref 11.5–15.5)
WBC: 7.8 10*3/uL (ref 4.0–10.5)

## 2017-05-16 LAB — TROPONIN I
Troponin I: <0.03 ng/mL (ref <0.03)
Troponin I: 0.06 ng/mL (ref <0.03)

## 2017-05-16 LAB — I-STAT TROPONIN, ED: TROPONIN I, POC: 0.02 ng/mL (ref 0.00–0.08)

## 2017-05-16 LAB — C DIFFICILE QUICK SCREEN W PCR REFLEX
C Diff antigen: NEGATIVE
C Diff interpretation: NOT DETECTED
C Diff toxin: NEGATIVE

## 2017-05-16 LAB — BRAIN NATRIURETIC PEPTIDE: B Natriuretic Peptide: 185.2 pg/mL — ABNORMAL HIGH (ref 0.0–100.0)

## 2017-05-16 LAB — LIPASE, BLOOD: LIPASE: 26 U/L (ref 11–51)

## 2017-05-16 MED ORDER — DILTIAZEM HCL ER COATED BEADS 180 MG PO CP24: 180.0000 mg | ORAL_CAPSULE | Freq: Every day | ORAL | Status: DC

## 2017-05-16 MED ORDER — ONDANSETRON HCL 4 MG/2ML IJ SOLN
4.0000 mg | Freq: Four times a day (QID) | INTRAMUSCULAR | Status: DC | PRN
  Administered 2017-05-16: 4 mg via INTRAVENOUS
  Filled 2017-05-16: qty 2

## 2017-05-16 MED ORDER — DEXTROSE-NACL 5-0.45 % IV SOLN
INTRAVENOUS | Status: DC
  Administered 2017-05-16 – 2017-05-18 (×3): via INTRAVENOUS

## 2017-05-16 MED ORDER — TRAZODONE HCL 50 MG PO TABS
50.0000 mg | ORAL_TABLET | Freq: Once | ORAL | Status: DC
  Administered 2017-05-16: 50 mg via ORAL
  Filled 2017-05-16: qty 1

## 2017-05-16 MED ORDER — HYPROMELLOSE (GONIOSCOPIC) 2.5 % OP SOLN: 1.0000 [drp] | Freq: Four times a day (QID) | OPHTHALMIC | Status: DC | PRN

## 2017-05-16 MED ORDER — DILTIAZEM HCL ER COATED BEADS 120 MG PO CP24
120.0000 mg | ORAL_CAPSULE | Freq: Every day | ORAL | Status: DC
  Administered 2017-05-16 – 2017-05-18 (×3): 120 mg via ORAL
  Filled 2017-05-16 (×3): qty 1

## 2017-05-16 MED ORDER — GABAPENTIN 300 MG PO CAPS
300.0000 mg | ORAL_CAPSULE | Freq: Every day | ORAL | Status: DC
  Administered 2017-05-16 – 2017-05-17 (×2): 300 mg via ORAL
  Filled 2017-05-16 (×2): qty 1

## 2017-05-16 MED ORDER — GABAPENTIN 300 MG PO CAPS
300.0000 mg | ORAL_CAPSULE | Freq: Once | ORAL | Status: DC
  Administered 2017-05-16: 300 mg via ORAL
  Filled 2017-05-16: qty 1

## 2017-05-16 MED ORDER — INFLUENZA VAC SPLIT HIGH-DOSE 0.5 ML IM SUSY
0.5000 mL | PREFILLED_SYRINGE | INTRAMUSCULAR | Status: AC
  Administered 2017-05-17: 0.5 mL via INTRAMUSCULAR
  Filled 2017-05-16: qty 0.5

## 2017-05-16 MED ORDER — HALOPERIDOL LACTATE 5 MG/ML IJ SOLN: 0.5000 mg | Freq: Once | INTRAMUSCULAR | Status: DC

## 2017-05-16 MED ORDER — ENOXAPARIN SODIUM 40 MG/0.4ML ~~LOC~~ SOLN
40.0000 mg | SUBCUTANEOUS | Status: DC
  Administered 2017-05-16 – 2017-05-18 (×3): 40 mg via SUBCUTANEOUS
  Filled 2017-05-16 (×2): qty 0.4

## 2017-05-16 MED ORDER — POTASSIUM CHLORIDE 10 MEQ/100ML IV SOLN
10.0000 meq | INTRAVENOUS | Status: AC
  Administered 2017-05-16 (×4): 10 meq via INTRAVENOUS
  Filled 2017-05-16 (×4): qty 100

## 2017-05-16 MED ORDER — BIOTENE DRY MOUTH MT LIQD
15.0000 mL | Freq: Three times a day (TID) | OROMUCOSAL | Status: DC
  Administered 2017-05-16 – 2017-05-18 (×7): 15 mL via OROMUCOSAL

## 2017-05-16 MED ORDER — NYSTATIN 100000 UNIT/ML MT SUSP
5.0000 mL | Freq: Three times a day (TID) | OROMUCOSAL | Status: DC
  Administered 2017-05-16 – 2017-05-18 (×7): 500000 [IU] via ORAL
  Filled 2017-05-16 (×7): qty 5

## 2017-05-16 MED ORDER — IOPAMIDOL (ISOVUE-300) INJECTION 61%
INTRAVENOUS | Status: AC
  Administered 2017-05-16: 100 mL via INTRAVENOUS
  Filled 2017-05-16: qty 100

## 2017-05-16 MED ORDER — POLYVINYL ALCOHOL 1.4 % OP SOLN
1.0000 [drp] | Freq: Four times a day (QID) | OPHTHALMIC | Status: DC | PRN
  Filled 2017-05-16: qty 15

## 2017-05-16 MED ORDER — GERHARDT'S BUTT CREAM
1.0000 "application " | TOPICAL_CREAM | Freq: Four times a day (QID) | CUTANEOUS | Status: DC | PRN
  Filled 2017-05-16: qty 1

## 2017-05-16 MED ORDER — TRAZODONE HCL 50 MG PO TABS
50.0000 mg | ORAL_TABLET | Freq: Every evening | ORAL | Status: DC | PRN
  Administered 2017-05-16 – 2017-05-17 (×2): 50 mg via ORAL
  Filled 2017-05-16 (×2): qty 1

## 2017-05-16 MED ORDER — MORPHINE SULFATE (PF) 4 MG/ML IV SOLN: 1.0000 mg | Freq: Once | INTRAVENOUS | Status: DC

## 2017-05-16 NOTE — Progress Notes (Signed)
CRITICAL VALUE ALERT  Critical Value:  Troponin 0.06  Date & Time Notied:  05/16/17 @ 1247  Provider Notified: Family Medicine Intern pager.  Orders Received/Actions taken: 12-lead EKG ordered.

## 2017-05-16 NOTE — Progress Notes (Signed)
FPTS Interim Progress Note  S:Patient reporting that she would like to no longer be NPO. She is having dry mouth. Her last diarrhea episode was this am. She is not having any more chest pain. She denies any other symptoms.  O: BP (!) 155/66 (BP Location: Right Arm)   Pulse (!) 101   Temp 99 F (37.2 C) (Oral)   Resp 18   Wt 181 lb (82.1 kg)   SpO2 94%   BMI 31.07 kg/m   General: NAD, laying in bed, pleasant Cardio: RRR, no mrg Pulm: CTABL, normal work of breathing GI: non-tender, hypo-active bowel sounds, mild distention Psych: appropriate affect  A/P: GI recommendations  Continue with hydration until no longer NPO Consider Lasix once patient not receiving fluid due to increased swelling and increased BNP of 185 Troponins neg x2 Will replete potassium  Diondre Pulis, [place], DO 05/16/2017, 11:21 AM PGY-1, [facility] pager [phone]

## 2017-05-16 NOTE — Consult Note (Signed)
Referring Provider: Dr. Rhunette Croft Primary Care Physician:  Olivia Canter, MD Primary Gastroenterologist:  UNASSIGNED  Reason for Consultation:  Abdominal pain; Question of rectal prolapse  HPI: Sharon Gilmore is a 83 y.o. female with multiple medical problems who was recently was hospitalized for urosepsis and acute rectosigmoid colitis with postop ileus. She is s/p spinal fusion on 04/23/17. She has been having loose nonbloody stools the latter half of this month without abdominal pain until today. Marked distention today compared to admit per the patient and her 2 sons who are bedside. Reports a normal colonoscopy 3 years ago by Dr. Matthias Hughs. CT on 05/04/17 showed rectosigmoid colonic wall thickening as well thickening of the descending colon. Denies melena or hematochezia. In the ER she was noted to have a long string-like tissue projecting out of the rectum that was hyperpigmented.  Past Medical History:  Diagnosis Date  . Abnormal barium swallow 09/1999  . Arthritis   . Asthma   . Carpal tunnel syndrome   . Depression   . Erythema nodosum   . H/O Doppler ultrasound    LEFT LEG 11/1997  . Hypertension   . Impaired fasting glucose   . Presence of permanent cardiac pacemaker    [employer]  . Psoriasis   . Rosacea   . Seizures (HCC) 1980  . Swallowing disorder   . Systolic murmur     Past Surgical History:  Procedure Laterality Date  . BILATERAL CARPAL TUNNEL RELEASE    . CHOLECYSTECTOMY    . COLONOSCOPY  2004  . INCISIONAL HERNIA REPAIR    . LEFT HEART CATH AND CORONARY ANGIOGRAPHY N/A 03/15/2017   Procedure: Left Heart Cath and Coronary Angiography;  Surgeon: Runell Gess, MD;  Location: [facility] INVASIVE CV LAB;  Service: Cardiovascular;  Laterality: N/A;  . PACEMAKER IMPLANT N/A 03/09/2017   Procedure: Pacemaker Implant;  Surgeon: Hillis Range, MD;  Location: [facility] INVASIVE CV LAB;  Service: Cardiovascular;  Laterality: N/A;  . TONSILLECTOMY      Prior to Admission medications    Medication Sig Start Date End Date Taking? Authorizing Provider  acetaminophen (TYLENOL) 500 MG tablet Take 1,000 mg by mouth 3 (three) times daily.   Yes [provider]  antiseptic oral rinse (BIOTENE) LIQD 15 mLs by Mouth Rinse route 3 (three) times daily before meals. spray   Yes [provider]  aspirin EC 81 MG tablet Take 81 mg by mouth at bedtime.    Yes [provider]  cyclobenzaprine (FLEXERIL) 5 MG tablet Take 5 mg by mouth 3 (three) times daily as needed for muscle spasms.   Yes [provider]  diltiazem (CARDIZEM CD) 180 MG 24 hr capsule Take 1 capsule (180 mg total) by mouth daily. 05/11/17  Yes Garth Bigness, MD  gabapentin (NEURONTIN) 100 MG capsule Take 1 capsule (100 mg total) by mouth 2 (two) times daily at 10 am and 4 pm. Patient taking differently: Take 100-300 mg by mouth See admin instructions. Take 100 mg at 10:00 am and 16:00  and 300 mg at bedtime 05/10/17  Yes Garth Bigness, MD  Hydrocortisone (GERHARDT'S BUTT CREAM) CREA Apply 1 application topically 4 (four) times daily as needed for irritation. 05/10/17  Yes Garth Bigness, MD  hydroxypropyl methylcellulose / hypromellose (ISOPTO TEARS / GONIOVISC) 2.5 % ophthalmic solution Place 1 drop into both eyes 4 (four) times daily as needed for dry eyes.   Yes [provider]  ipratropium-albuterol (DUONEB) 0.5-2.5 (3) MG/3ML SOLN Take 3 mLs by nebulization  See admin instructions. For five days started 05/12/2017   Yes [provider]  nystatin (MYCOSTATIN) 100000 UNIT/ML suspension Take 5 mLs by mouth 3 (three) times daily. For seven days starting 05/15/2017   Yes [provider]  saccharomyces boulardii (FLORASTOR) 250 MG capsule Take 1 capsule (250 mg total) by mouth 2 (two) times daily. 05/10/17  Yes Garth Bigness, MD  tamsulosin [facility]) 0.4 MG CAPS capsule Take 1 capsule (0.4 mg total) by mouth daily after breakfast. 05/11/17  Yes Garth Bigness, MD  traZODone (DESYREL) 50 MG tablet Take 1 tablet (50 mg total) by mouth at bedtime as needed for sleep. 05/10/17  Yes Garth Bigness, MD  Cholecalciferol (VITAMIN D3) 2000 units TABS Take 2,000 Units by mouth daily.     [provider]  gabapentin (NEURONTIN) 600 MG tablet Take 0.5 tablets (300 mg total) by mouth at bedtime. Patient not taking: Reported on 05/15/2017 05/10/17   Garth Bigness, MD  hydroxypropyl methylcellulose / hypromellose (ISOPTO TEARS / GONIOVISC) 2.5 % ophthalmic solution Place 1 drop into both eyes 4 (four) times daily as needed for dry eyes.    [provider]  traMADol (ULTRAM) 50 MG tablet Take 1 tablet (50 mg total) by mouth every 6 (six) hours as needed for moderate pain. Patient not taking: Reported on 05/15/2017 04/27/17   Maeola Harman, MD    Scheduled Meds: . antiseptic oral rinse  15 mL Mouth Rinse TID AC  . enoxaparin (LOVENOX) injection  40 mg Subcutaneous Q24H  . haloperidol lactate  0.5 mg Intravenous Once  . [START ON 05/17/2017] Influenza vac split quadrivalent PF  0.5 mL Intramuscular Tomorrow-1000  .  morphine injection  1 mg Intravenous Once  . nystatin  5 mL Oral TID   Continuous Infusions: . dextrose 5 % and 0.45% NaCl 75 mL/hr at 05/16/17 0711  . potassium chloride 10 mEq (05/16/17 1308)   PRN Meds:.Gerhardt's butt cream, ondansetron (ZOFRAN) IV, polyvinyl alcohol  Allergies as of 05/15/2017 - Review Complete 05/15/2017  Allergen Reaction Noted  . Celebrex [celecoxib] Other (See Comments) 06/02/2016  . Codeine Nausea And Vomiting 06/02/2016  . Cymbalta [duloxetine hcl] Other (See Comments) 05/02/2017  . Doxycycline Swelling and Other (See Comments) 04/12/2017  . Meloxicam Other (See Comments) 06/02/2016  . Nortriptyline Other (See Comments) 06/02/2016  . Percocet [oxycodone-acetaminophen] Nausea And Vomiting 06/02/2016  . Penicillins Rash and Other (See Comments) 06/02/2016    Family History  Problem  Relation Age of Onset  . CVA Mother   . Stroke Mother   . Parkinson's disease Father   . Leukemia Brother   . CVA Maternal Grandmother   . Stroke Maternal Grandmother   . Peripheral vascular disease Maternal Grandfather   . Breast cancer Paternal Grandmother   . Diabetes Paternal Grandmother   . CVA Paternal Grandfather   . Stroke Paternal Grandfather   . Pancreatic cancer Paternal Aunt   . Stroke Paternal Aunt   . Lung cancer Paternal Uncle   . Pancreatic cancer Brother   . Coronary artery disease Brother     Social History   Social History  . Marital status: Widowed    Spouse name: N/A  . Number of children: 7  . Years of education: College   Occupational History  . RETIRED RN    Social History Main Topics  . Smoking status: Former Smoker    Quit date: 1982  . Smokeless tobacco: Never Used  . Alcohol use Yes     Comment: 1-2  times per week  . Drug use: No  . Sexual activity: Not on file   Other Topics Concern  . Not on file   Social History Narrative   Lives at home alone.   Right-handed.   Two cups caffeine per day.    Review of Systems: All negative except as stated above in HPI.  Physical Exam: Vital signs: Vitals:   05/16/17 0530 05/16/17 0622  BP: (!) 143/68 (!) 155/66  Pulse: 94 (!) 101  Resp: (!) 24 18  Temp:  99 F (37.2 C)  SpO2: 90% 94%   Last BM Date: 05/15/17 General:  Lethargic, elderly, frail, Well-developed, well-nourished, mild acute distress Head: normocephalic, atraumatic Eyes: anicteric sclera ENT: oropharynx clear Neck: supple, nontender Lungs:  Clear throughout to auscultation.   No wheezes, crackles, or rhonchi. No acute distress. Heart:  Regular rate and rhythm; no murmurs, clicks, rubs,  or gallops. Abdomen: diffuse tenderness with guarding, soft, +distention, +BS  Rectal:  Deferred Ext: 2+ pitting LE edema  GI:  Lab Results:  Recent Labs  05/15/17 2334 05/16/17 0016  WBC 7.8  --   HGB 9.7* 9.5*  HCT 30.4*  28.0*  PLT 324  --    BMET  Recent Labs  05/16/17 0016 05/16/17 1043  NA 137 134*  K 3.2* 3.3*  CL 98* 101  CO2  --  26  GLUCOSE 96 90  BUN <3* <5*  CREATININE 0.30* 0.35*  CALCIUM  --  7.5*   LFT  Recent Labs  05/16/17 1043  PROT 4.9*  ALBUMIN 1.7*  AST 26  ALT 18  ALKPHOS 71  BILITOT 0.7   PT/INR No results for input(s): LABPROT, INR in the last 72 hours.   Studies/Results: Dg Chest Port 1 View  Result Date: 05/15/2017 CLINICAL DATA:  Shortness of breath. EXAM: PORTABLE CHEST 1 VIEW COMPARISON:  Chest from abdominal radiographs 05/06/2017 FINDINGS: Left-sided pacemaker remains in place. Unchanged heart size and mediastinal contours with tortuous thoracic aorta. Low lung volumes persist. Persistent left lower lobe atelectasis and probable effusion. Slight improved right lung base aeration. No new consolidation, pulmonary edema or pneumothorax. IMPRESSION: Persistent low lung volumes with left lung base atelectasis and probable small effusion. Electronically Signed   By: Rubye Oaks M.D.   On: 05/15/2017 23:55   Dg Abd Portable 1 View  Result Date: 05/16/2017 CLINICAL DATA:  Concern for small bowel obstruction. Prolapsed rectum noted staff at rehab center. EXAM: PORTABLE ABDOMEN - 1 VIEW COMPARISON:  Most recent radiographs 05/08/2017. Most recent CT 05/04/2017 FINDINGS: Increased right-sided colonic stool from prior exams. Similar gaseous distention of transverse and descending colon and small bowel. No evidence of free air on single supine view, upper abdomen not included. Posterior lumbar fusion again seen. IMPRESSION: Increased stool in the right colon from prior, otherwise unchanged bowel gas pattern with mild gaseous distension of distal colon and small bowel. Suspect slow transit. No specific findings to suggest obstruction on the current exam. Electronically Signed   By: Rubye Oaks M.D.   On: 05/16/2017 00:00    Impression/Plan: Abdominal distention,  abdominal pain, and diarrhea concerning for ischemic colitis. Suspect tissue seen coming out of rectum yesterday was mucosal sloughing of possible necrotic tissue that was being expelled from the rectum. Agree that it is not consistent with rectal prolapse. Worsening distention and pain is concerning for worsened ischemia and a stat IV contrast CT ordered (I do not think she will be able to tolerate oral contrast). May need  a flexible sigmoidoscopy tomorrow but will have Dr. Randa Evens f/u on CT and decide whether to do a flex sig tomorrow to evaluate the rectosigmoid for ischemic bowel. Keep NPO.     LOS: 0 days   Tyrina Hines C.  05/16/2017, 2:14 PM  Pager [phone]  AFTER 5 pm or on weekends please call [phone]

## 2017-05-16 NOTE — Discharge Summary (Signed)
Family Medicine Teaching [facility] Discharge Summary  Patient name: Sharon Gilmore Medical record number: [id number] Date of birth: 1933-12-15 Age: 83 y.o. Gender: female Date of Admission: 05/15/2017  Date of Discharge: 10/2 Admitting Physician: Carney Living, MD  Primary Care Provider: Olivia Canter, MD Consultants: GI  Indication for Hospitalization: protrusion from rectum  Discharge Diagnoses/Problem List:  Concern for rectal prolapse Chest pain LE swelling Hypokalemia Hx of spinal surgery HTN Hx of high degree AV block, s/p pacemaker Asthma HLD  Disposition: snf  Discharge Condition: good  Discharge Exam: Constitutional: She is oriented to person, place, and time and well-developed, well-nourished, and in no distress. No distress.  HENT:  Head: Normocephalic and atraumatic.  Right Ear: External ear normal.  Left Ear: External ear normal.  Eyes: Pupils are equal, round, and reactive to light. EOM are normal.  Neck: Normal range of motion. No thyromegaly present.  Cardiovascular: Normal rate, regular rhythm, normal heart sounds and intact distal pulses.   Pulmonary/Chest: Effort normal. No respiratory distress. She has no wheezes.  Abdominal: Soft. Bowel sounds are normal. She exhibits no distension. There is no tenderness. There is no rebound.  Musculoskeletal: Normal range of motion. She exhibits no edema or deformity.  Neurological: She is alert and oriented to person, place, and time.  Skin: Skin is warm. She is not diaphoretic.  Psychiatric: Affect and judgment normal.   Brief Hospital Course:  Sharon Gilmore is a 83 y.o. female who presented with concern for rectal prolapse. PMH is significant for known AV block s/p pacemaker placement, HTN, lumbar stenosis with recent fusion on 04/23/17. Patient recently admitted for urosepsis and then developed ileus and colitis. Discharged on 05/10/2017. In the ED, patient was noted to have a long  string-like tissue projecting out of the rectum that was hyperpigmented and was subsequently cut in the ED. Patient had chest pain at admission. She had an unchanged ekg and troponins negative x3. Her chest pain resolved after admission. GI was consulted and recommended a ct w/ w/o contrast. This ct scan showed unresolved colitis which was present at last admission. Patient underwent flex sig on 10/1 which showed ulcerative granulation tissue consistent with ischemic bowel. After fluid hydration patient felt much better on 10/2. Was deemed stable for discharge at that time She was discharged back to her snf on 10/2.  Issues for Follow Up:  1. Colitis with ileus - please observe for constipation. If patient does not stool, GI recommended Miralax daily for maintenance. In terms of diet, please advance as tolerated. 2. Urinary retention - patient had foley inserted on previous admission 2/2 emphysematous cystitis. Flomax started 9/24.  3. Hypoalbuminemia- patient with some LE edema likely due to third spacing. Would recheck albumin in 1 week if edema persistent as albumin can be falsely low as an acute phase reactant.  4. New onset afib with RVR during last admission - due to acute stress. Now in SR, pacer interrogated without past episodes of afib. Cards recommended against anticoagulation. Please wean off of diltiazem as tolerated. 5. Recent hx of spinal surgery - continue gabapentin 100 mg BID and 300 mg QHS for nerve pain. Follow up with Dr. Venetia Maxon.   6. Ischemic colitis: please follow up with Dr. Matthias Hughs on 11/1 at 10:30  Significant Procedures:  Flexible Sigmoidoscopy  Significant Labs and Imaging:   Recent Labs Lab 05/15/17 2334 05/16/17 0016 05/17/17 0633  WBC 7.8  --  6.8  HGB 9.7* 9.5* 10.1*  HCT 30.4* 28.0*  31.4*  PLT 324  --  317    Recent Labs Lab 05/16/17 0016 05/16/17 1043 05/17/17 0633  NA 137 134* 135  K 3.2* 3.3* 3.5  CL 98* 101 103  CO2  --  26 27  GLUCOSE 96 90 102*   BUN <3* <5* <5*  CREATININE 0.30* 0.35* 0.35*  CALCIUM  --  7.5* 7.6*  ALKPHOS  --  71  --   AST  --  26  --   ALT  --  18  --   ALBUMIN  --  1.7*  --     Ct Abdomen Pelvis W Contrast  Result Date: 05/16/2017 CLINICAL DATA:  Abdominal pain and diarrhea. EXAM: CT ABDOMEN AND PELVIS WITH CONTRAST TECHNIQUE: Multidetector CT imaging of the abdomen and pelvis was performed using the standard protocol following bolus administration of intravenous contrast. CONTRAST:  ISOVUE-300 IOPAMIDOL (ISOVUE-300) INJECTION 61% COMPARISON:  CT abdomen pelvis 05/04/2017 FINDINGS: Lower chest: Bilateral pleural effusions have increased in size, now medium-sized on the right and small on the left. There is associated bibasilar atelectasis. No pericardial effusion. Hepatobiliary: Normal hepatic contours and density. No visible biliary dilatation. Status post cholecystectomy. Pancreas: Normal contours without ductal dilatation. No peripancreatic fluid collection. Spleen: Normal. Adrenals/Urinary Tract: --Adrenal glands: Normal. --Right kidney/ureter: No hydronephrosis or perinephric stranding. No nephrolithiasis. No obstructing ureteral stones. --Left kidney/ureter: No hydronephrosis or perinephric stranding. No nephrolithiasis. No obstructing ureteral stones. --Urinary bladder: Unremarkable. Stomach/Bowel: --Stomach/Duodenum: No hiatal hernia or other gastric abnormality. Normal duodenal course and caliber. --Small bowel: No dilatation or inflammation. --Colon: There is hyperemia and wall thickening of the descending colon and the rectum. There is relative sparing of the sigmoid colon and the proximal colon. No pneumatosis intestinalis or free intraperitoneal air. No intraperitoneal abscess. Trace free fluid in the pelvis. --Appendix: Normal. Vascular/Lymphatic: There is atherosclerotic calcification of the non aneurysmal abdominal aorta. Celiac axis, superior mesenteric artery and inferior mesenteric arteries are  patent. The portal vein, splenic vein and superior mesenteric vein are patent. No abdominal or pelvic lymphadenopathy. Reproductive: Normal uterus and ovaries. Musculoskeletal. Posterior spinal fusion at L2-L5. Other: None. IMPRESSION: 1. Hyperemia and wall thickening of the descending colon and the rectum, suggesting inflammatory or infectious colitis. The distribution is unchanged compared to the prior study. There is trace free fluid in the pelvis but no abscess, pneumatosis or free intraperitoneal air. 2. Increased medium-sized right and small left pleural effusions with associated atelectasis. Electronically Signed   By: Deatra Robinson M.D.   On: 05/16/2017 17:20   Results/Tests Pending at Time of Discharge: none  Discharge Medications:  Allergies as of 05/18/2017      Reactions   Celebrex [celecoxib] Other (See Comments)   LEG ACHING   Codeine Nausea And Vomiting   Cymbalta [duloxetine Hcl] Other (See Comments)   Sensitive per [facility]   Doxycycline Swelling, Other (See Comments)   Facial swelling   Meloxicam Other (See Comments)   TUMMY ACHE, INEFFECTIVE   Nortriptyline Other (See Comments)   unknown   Percocet [oxycodone-acetaminophen] Nausea And Vomiting   Penicillins Rash, Other (See Comments)   Has patient had a PCN reaction causing immediate rash, facial/tongue/throat swelling, SOB or lightheadedness with hypotension: No Has patient had a PCN reaction causing severe rash involving mucus membranes or skin necrosis: No Has patient had a PCN reaction that required hospitalization No Has patient had a PCN reaction occurring within the last 10 years: No If all of the above answers are "NO", then may  proceed with Cephalosporin use.      Medication List    TAKE these medications   acetaminophen 500 MG tablet Commonly known as:  TYLENOL Take 1,000 mg by mouth 3 (three) times daily.   antiseptic oral rinse Liqd 15 mLs by Mouth Rinse route 3 (three) times daily before meals. spray    aspirin EC 81 MG tablet Take 81 mg by mouth at bedtime.   cyclobenzaprine 5 MG tablet Commonly known as:  FLEXERIL Take 5 mg by mouth 3 (three) times daily as needed for muscle spasms.   diltiazem 180 MG 24 hr capsule Commonly known as:  CARDIZEM CD Take 1 capsule (180 mg total) by mouth daily.   gabapentin 100 MG capsule Commonly known as:  NEURONTIN Take 1 capsule (100 mg total) by mouth 2 (two) times daily at 10 am and 4 pm. What changed:  how much to take  when to take this  additional instructions   gabapentin 600 MG tablet Commonly known as:  NEURONTIN Take 0.5 tablets (300 mg total) by mouth at bedtime. What changed:  Another medication with the same name was changed. Make sure you understand how and when to take each.   Gerhardt's butt cream Crea Apply 1 application topically 4 (four) times daily as needed for irritation.   hydroxypropyl methylcellulose / hypromellose 2.5 % ophthalmic solution Commonly known as:  ISOPTO TEARS / GONIOVISC Place 1 drop into both eyes 4 (four) times daily as needed for dry eyes.   hydroxypropyl methylcellulose / hypromellose 2.5 % ophthalmic solution Commonly known as:  ISOPTO TEARS / GONIOVISC Place 1 drop into both eyes 4 (four) times daily as needed for dry eyes.   ipratropium-albuterol 0.5-2.5 (3) MG/3ML Soln Commonly known as:  DUONEB Take 3 mLs by nebulization See admin instructions. For five days started 05/12/2017   nystatin 100000 UNIT/ML suspension Commonly known as:  MYCOSTATIN Take 5 mLs by mouth 3 (three) times daily. For seven days starting 05/15/2017   polyethylene glycol packet Commonly known as:  MIRALAX / GLYCOLAX Take 17 g by mouth 2 (two) times daily.   saccharomyces boulardii 250 MG capsule Commonly known as:  FLORASTOR Take 1 capsule (250 mg total) by mouth 2 (two) times daily.   tamsulosin 0.4 MG Caps capsule Commonly known as:  FLOMAX Take 1 capsule (0.4 mg total) by mouth daily after breakfast.    traMADol 50 MG tablet Commonly known as:  ULTRAM Take 1 tablet (50 mg total) by mouth every 6 (six) hours as needed for moderate pain.   traZODone 50 MG tablet Commonly known as:  DESYREL Take 1 tablet (50 mg total) by mouth at bedtime as needed for sleep.   Vitamin D3 2000 units Tabs Take 2,000 Units by mouth daily.       Discharge Instructions: Please refer to Patient Instructions section of EMR for full details.  Patient was counseled important signs and symptoms that should prompt return to medical care, changes in medications, dietary instructions, activity restrictions, and follow up appointments.   Follow-Up Appointments: Follow-up Information    Bernette Redbird, MD Follow up on 06/17/2017.   Specialty:  Gastroenterology Why:  Please arrive for at 10:15 for a 10:30 appointment Contact information: [address] [place] [address] [phone]           Myrene Buddy, MD 05/18/2017, 3:05 PM PGY-1, [facility]

## 2017-05-16 NOTE — H&P (Signed)
Family Medicine Teaching [facility] Admission History and Physical Service Pager: [phone]  Patient name: Sharon Gilmore Medical record number: [id number] Date of birth: 1934/01/09 Age: 83 y.o. Gender: female  Primary Care Provider: Olivia Canter, MD Consultants: GI Code Status: FULL (confirmed on admission)  Chief Complaint: tissue protruding from rectum with concern for rectal prolapse  Assessment and Plan: Sharon Gilmore is a 83 y.o. female presenting with concern for rectal prolapse. PMH is significant for known AV block s/p pacemaker placement, HTN, lumbar stenosis with recent fusion on 04/23/17.   Concern for rectal prolapse Isolated event noted today at SNF after BM. Denies abdominal pain. Continues to endorse diarrhea since discharge 9/24, about 3 BMs per day. Patient recently admitted from 09/16-09/24 for urosepsis and acute rectosigmoid colitis. During that admission, she was admitted to ICU due to hypotension requiring phenylephrine. She was treated with CTX for urosepsis and cipro/flagyl for colitis.  On exam of rectum, greenish slimy mucus expelled per rectum, no further tissue specimen seen. Diffuse tenderness to palpation of abdomen. WBC wnl at 7.8. Vitals stable, afebrile. - Admit to telemetry, attending Dr. Deirdre Priest - GI to see in am, possible scope - NPO until scope - Gentle hydration with D5 1/2NS @ 69ml/hr  Chest pain: Now resolved. Occurred intermittently during rectal exams. Describes as tightness. Non-radiating. Trop neg at 0.02. EKG with paced rhythm and new T wave inversions in inferior and lateral leads. Given ASA 325mg  in ED. One episode of Afib with RVR at last admission and started on diltiazem. In NSR on admission. Can consider Cardiology consult if pain returns or work up revealing. - Trend trop - Continue to monitor - BNP - Tele - Repeat EKG in AM  LE swelling 3+ pitting edema to knee. States this is new. No h/o CHF. No other signs of fluid  overload. Low albumin at last admission, so may be 2/2 to this and low oncotic pressure. Allowing gentle hydration in the setting of NPO status prior to GI scope. - BNP - CMP - Compression stockings  Hypokalemia K 3.2 on admission. Will replete once no longer NPO as IV repletion likely uncomfortable since only allowing for gentle hydration. - monitor - Replete as necessary  Hx of spinal surgery: recent spinal surgery with Dr. Venetia Maxon (L2-L5 decompression and fusion) on 04/23/17. Prior to this, lived in independent apartment at [place]. D/c to SNF for rehab. No signs of infection or bleeding over site, patient denies back pain. -monitor on exam  HTN: home meds are enalapril, HCTZ, lopressor. BP on admission 153/73. - hold home meds until scope - PRN metoprolol for SBP >180  Hx of high degree AV block, s/p pacemaker: pacer placed 03/08/17. EKG in ED with paced rhythm and new T wave inversions in inferior and lateral leads, s/p ASA 325mg . - monitor on tele  Asthma: patient reports no inhalers for several years at previous admission. Endorsing some SOB with non productive cough since this morning around 4am. Was given breathing treatment at SNF. Sating well on RA. -monitor O2 sats on vitals  HLD: home med is lipitor 20mg . Hold until scope. -hold home lipitor  FEN/GI: NPO, D5 1/2NS @75cc /hr Prophylaxis: Lovenox  Disposition: Admit to FPTS  History of Present Illness:  Sharon Gilmore is a 83 y.o. female presenting with concern for rectal prolapse.   Patient was noted to have tissue protrusion from rectum after having BM at SNF today. There was concern for rectal prolapse by SNF staff, so patient was  sent to ED. In ED, she was noted to have dry, possibly gangrenous tissue protruding from rectum. Surgery was consulted, who recommended cutting tissue. Upon doing so, a slimy material was expressed from the tissue. Surgery was called with update, and recommended consulting GI. GI  recommended admission with scope likely tomorrow.  Prior to ED presentation today, patient reports diarrhea since discharge from most recent admission on 09/24. Is having about 3 loose stools per day, which accompanying straining. No constipation, no bloody stools. Has been given probiotics at SNF with no improvement in symptoms. Denies abdominal pain, dysuria, hematuria. Denies vomiting. Possibly nauseated, though thinks she may be hungry instead. Does endorse abdominal distention but this is not a new issue and has improved since discharge.  Began to endorse SOB and chest tightness while in ED, primarily during rectal exams. Reports that she also had an episode of chest tightness and SOB around 4AM and was given breathing treatment. Says chest tightness does not radiate. Endorses mild non-productive cough but this also is not new. Does not require supplemental O2.  Of note, patient recently admitted from 09/16-09/24 for urosepsis and acute rectosigmoid colitis. During that admission, she was admitted to ICU due to hypotension requiring phenylephrine. She was treated with CTX for urosepsis and cipro/flagyl for colitis. She also developed new onset a.fib with RVR thought likely due to acute stress, however patient's pacemaker showed there was only an isolated episode. She was subsequently started on PO diltiazem. Also had Foley placed prior to discharge for urinary retention with which she was discharged.   Review Of Systems: Per HPI with the following additions:   Review of Systems  Respiratory: Positive for cough and shortness of breath. Negative for sputum production.   Cardiovascular: Positive for chest pain and leg swelling.  Gastrointestinal: Positive for diarrhea and nausea. Negative for abdominal pain, blood in stool, constipation and vomiting.  Genitourinary: Negative for dysuria and hematuria.    Patient Active Problem List   Diagnosis Date Noted  . SBO (small bowel obstruction) (HCC)   .  Atrial fibrillation with rapid ventricular response (HCC)   . Second degree AV block, Mobitz type II   . Colitis   . AKI (acute kidney injury) (HCC)   . Urinary tract infection without hematuria   . Sepsis (HCC) 05/02/2017  . Septic shock (HCC) 05/02/2017  . Lumbar spinal stenosis 04/23/2017  . DOE (dyspnea on exertion) 03/08/2017  . AV block, 2nd degree 03/08/2017  . Essential hypertension 03/08/2017  . Family history of coronary artery disease in brother 03/08/2017  . History of asthma 03/08/2017  . AVB (atrioventricular block) 03/08/2017  . Abdominal pain 01/04/2017  . Spinal stenosis 01/04/2017    Past Medical History: Past Medical History:  Diagnosis Date  . Abnormal barium swallow 09/1999  . Arthritis   . Asthma   . Carpal tunnel syndrome   . Depression   . Erythema nodosum   . H/O Doppler ultrasound    LEFT LEG 11/1997  . Hypertension   . Impaired fasting glucose   . Presence of permanent cardiac pacemaker    [employer]ON[employer]  . Psoriasis   . Rosacea   . Seizures (HCC) 1980  . Swallowing disorder   . Systolic murmur     Past Surgical History: Past Surgical History:  Procedure Laterality Date  . BILATERAL CARPAL TUNNEL RELEASE    . CHOLECYSTECTOMY    . COLONOSCOPY  2004  . INCISIONAL HERNIA REPAIR    . LEFT HEART CATH AND  CORONARY ANGIOGRAPHY N/A 03/15/2017   Procedure: Left Heart Cath and Coronary Angiography;  Surgeon: Runell Gess, MD;  Location: [facility] INVASIVE CV LAB;  Service: Cardiovascular;  Laterality: N/A;  . PACEMAKER IMPLANT N/A 03/09/2017   Procedure: Pacemaker Implant;  Surgeon: Hillis Range, MD;  Location: [facility] INVASIVE CV LAB;  Service: Cardiovascular;  Laterality: N/A;  . TONSILLECTOMY      Social History: Social History  Substance Use Topics  . Smoking status: Former Smoker    Quit date: 1982  . Smokeless tobacco: Never Used  . Alcohol use Yes     Comment: 1-2 times per week   Additional social history: Lives at [facility]. Two sons that live  close by.  7 total children. Please also refer to relevant sections of EMR.  Family History: Family History  Problem Relation Age of Onset  . CVA Mother   . Stroke Mother   . Parkinson's disease Father   . Leukemia Brother   . CVA Maternal Grandmother   . Stroke Maternal Grandmother   . Peripheral vascular disease Maternal Grandfather   . Breast cancer Paternal Grandmother   . Diabetes Paternal Grandmother   . CVA Paternal Grandfather   . Stroke Paternal Grandfather   . Pancreatic cancer Paternal Aunt   . Stroke Paternal Aunt   . Lung cancer Paternal Uncle   . Pancreatic cancer Brother   . Coronary artery disease Brother    Allergies and Medications: Allergies  Allergen Reactions  . Celebrex [Celecoxib] Other (See Comments)    LEG ACHING   . Codeine Nausea And Vomiting  . Cymbalta [Duloxetine Hcl] Other (See Comments)    Sensitive per MAR  . Doxycycline Swelling and Other (See Comments)    Facial swelling  . Meloxicam Other (See Comments)    TUMMY ACHE, INEFFECTIVE  . Nortriptyline Other (See Comments)    unknown  . Percocet [Oxycodone-Acetaminophen] Nausea And Vomiting  . Penicillins Rash and Other (See Comments)    Has patient had a PCN reaction causing immediate rash, facial/tongue/throat swelling, SOB or lightheadedness with hypotension: No Has patient had a PCN reaction causing severe rash involving mucus membranes or skin necrosis: No Has patient had a PCN reaction that required hospitalization No Has patient had a PCN reaction occurring within the last 10 years: No If all of the above answers are "NO", then may proceed with Cephalosporin use.   No current facility-administered medications on file prior to encounter.    Current Outpatient Prescriptions on File Prior to Encounter  Medication Sig Dispense Refill  . acetaminophen (TYLENOL) 500 MG tablet Take 1,000 mg by mouth 3 (three) times daily.    Marland Kitchen aspirin EC 81 MG tablet Take 81 mg by mouth at bedtime.      . cyclobenzaprine (FLEXERIL) 5 MG tablet Take 5 mg by mouth 3 (three) times daily as needed for muscle spasms.    Marland Kitchen diltiazem (CARDIZEM CD) 180 MG 24 hr capsule Take 1 capsule (180 mg total) by mouth daily. 30 capsule 0  . gabapentin (NEURONTIN) 100 MG capsule Take 1 capsule (100 mg total) by mouth 2 (two) times daily at 10 am and 4 pm. (Patient taking differently: Take 100-300 mg by mouth See admin instructions. Take 100 mg at 10:00 am and 16:00  and 300 mg at bedtime)    . Hydrocortisone (GERHARDT'S BUTT CREAM) CREA Apply 1 application topically 4 (four) times daily as needed for irritation.    . saccharomyces boulardii (FLORASTOR) 250 MG capsule Take 1  capsule (250 mg total) by mouth 2 (two) times daily.    . tamsulosin (FLOMAX) 0.4 MG CAPS capsule Take 1 capsule (0.4 mg total) by mouth daily after breakfast. 30 capsule   . traZODone (DESYREL) 50 MG tablet Take 1 tablet (50 mg total) by mouth at bedtime as needed for sleep.    . Cholecalciferol (VITAMIN D3) 2000 units TABS Take 2,000 Units by mouth daily.     Marland Kitchen gabapentin (NEURONTIN) 600 MG tablet Take 0.5 tablets (300 mg total) by mouth at bedtime. (Patient not taking: Reported on 05/15/2017)    . hydroxypropyl methylcellulose / hypromellose (ISOPTO TEARS / GONIOVISC) 2.5 % ophthalmic solution Place 1 drop into both eyes 4 (four) times daily as needed for dry eyes.    . traMADol (ULTRAM) 50 MG tablet Take 1 tablet (50 mg total) by mouth every 6 (six) hours as needed for moderate pain. (Patient not taking: Reported on 05/15/2017) 60 tablet 0    Objective: BP (!) 153/73   Pulse 96   Temp 99.7 F (37.6 C) (Oral)   Resp (!) 27   Wt 181 lb (82.1 kg)   SpO2 96%   BMI 31.07 kg/m   Exam: General: Elderly female lying in bed, in NAD, tired appearing Cardiovascular: RRR, systolic murmur Respiratory: CTA bilaterally, coarse breath sounds throughout, no crackles Gastrointestinal: +BS, distended, slightly tender to palpation diffusely Rectal  exam: No further tissue seen. Greenish mucus expressed spontaneously on exam. Extremities: WWp, 3+ pitting edema to knee, dorsal pedal pulses appreciated Neuro: Alert and oriented, speech normal.  Labs and Imaging: CBC BMET   Recent Labs Lab 05/15/17 2334 05/16/17 0016  WBC 7.8  --   HGB 9.7* 9.5*  HCT 30.4* 28.0*  PLT 324  --     Recent Labs Lab 05/10/17 0142 05/16/17 0016  NA 135 137  K 3.6 3.2*  CL 110 98*  CO2 20*  --   BUN <5* <3*  CREATININE 0.36* 0.30*  GLUCOSE 103* 96  CALCIUM 7.4*  --      Dg Chest Port 1 View  Result Date: 05/15/2017 CLINICAL DATA:  Shortness of breath. EXAM: PORTABLE CHEST 1 VIEW COMPARISON:  Chest from abdominal radiographs 05/06/2017 FINDINGS: Left-sided pacemaker remains in place. Unchanged heart size and mediastinal contours with tortuous thoracic aorta. Low lung volumes persist. Persistent left lower lobe atelectasis and probable effusion. Slight improved right lung base aeration. No new consolidation, pulmonary edema or pneumothorax. IMPRESSION: Persistent low lung volumes with left lung base atelectasis and probable small effusion. Electronically Signed   By: Rubye Oaks M.D.   On: 05/15/2017 23:55   Dg Abd Portable 1 View  Result Date: 05/16/2017 CLINICAL DATA:  Concern for small bowel obstruction. Prolapsed rectum noted staff at rehab center. EXAM: PORTABLE ABDOMEN - 1 VIEW COMPARISON:  Most recent radiographs 05/08/2017. Most recent CT 05/04/2017 FINDINGS: Increased right-sided colonic stool from prior exams. Similar gaseous distention of transverse and descending colon and small bowel. No evidence of free air on single supine view, upper abdomen not included. Posterior lumbar fusion again seen. IMPRESSION: Increased stool in the right colon from prior, otherwise unchanged bowel gas pattern with mild gaseous distension of distal colon and small bowel. Suspect slow transit. No specific findings to suggest obstruction on the current exam.  Electronically Signed   By: Rubye Oaks M.D.   On: 05/16/2017 00:00    Ellwood Dense, DO 05/16/2017, 1:45 AM PGY-1, [place] [facility] FPTS Intern pager: [phone], text pages welcome  UPPER LEVEL ADDENDUM  I have read the above note and made revisions highlighted in orange.  Tarri Abernethy, MD, MPH PGY-3 [facility] Pager [phone]

## 2017-05-16 NOTE — Progress Notes (Signed)
Patient ID: Sharon Gilmore, female   DOB: August 26, 1933, 83 y.o.   MRN: [id number]  CT without any acute changes compared with 05/04/17 study with "hyperemia and wall thickening of the descending colon and the rectum." Ice chips and sips of water ok. Will plan for flexible sigmoidoscopy (2 tap water enemas ordered) by Dr. Randa Evens tomorrow (time to be determined).

## 2017-05-16 NOTE — ED Notes (Signed)
ED Provider at bedside. 

## 2017-05-17 ENCOUNTER — Encounter (HOSPITAL_COMMUNITY)
Admission: EM | Disposition: A | Discharge status: Skilled Nursing Facility | Payer: Self-pay | Source: Home / Self Care | Attending: Family Medicine

## 2017-05-17 ENCOUNTER — Inpatient Hospital Stay (HOSPITAL_COMMUNITY): Payer: Medicare Other | Admitting: Anesthesiology

## 2017-05-17 ENCOUNTER — Encounter (HOSPITAL_COMMUNITY): Payer: Self-pay

## 2017-05-17 DIAGNOSIS — I208 Other forms of angina pectoris: Secondary | ICD-10-CM

## 2017-05-17 HISTORY — PX: FLEXIBLE SIGMOIDOSCOPY: SHX5431

## 2017-05-17 LAB — CBC
HCT: 31.4 % — ABNORMAL LOW (ref 36.0–46.0)
HEMOGLOBIN: 10.1 g/dL — AB (ref 12.0–15.0)
MCH: 28.4 pg (ref 26.0–34.0)
MCHC: 32.2 g/dL (ref 30.0–36.0)
MCV: 88.2 fL (ref 78.0–100.0)
Platelets: 317 10*3/uL (ref 150–400)
RBC: 3.56 MIL/uL — AB (ref 3.87–5.11)
RDW: 18.8 % — ABNORMAL HIGH (ref 11.5–15.5)
WBC: 6.8 10*3/uL (ref 4.0–10.5)

## 2017-05-17 LAB — BASIC METABOLIC PANEL
Anion gap: 5 (ref 5–15)
CHLORIDE: 103 mmol/L (ref 101–111)
CO2: 27 mmol/L (ref 22–32)
CREATININE: 0.35 mg/dL — AB (ref 0.44–1.00)
Calcium: 7.6 mg/dL — ABNORMAL LOW (ref 8.9–10.3)
GFR calc Af Amer: >60 mL/min (ref >60)
GFR calc non Af Amer: >60 mL/min (ref >60)
GLUCOSE: 102 mg/dL — AB (ref 65–99)
POTASSIUM: 3.5 mmol/L (ref 3.5–5.1)
SODIUM: 135 mmol/L (ref 135–145)

## 2017-05-17 SURGERY — SIGMOIDOSCOPY, FLEXIBLE
Anesthesia: Monitor Anesthesia Care

## 2017-05-17 MED ORDER — PROPOFOL 500 MG/50ML IV EMUL
INTRAVENOUS | Status: DC | PRN
  Administered 2017-05-17: 100 ug/kg/min via INTRAVENOUS

## 2017-05-17 MED ORDER — PROPOFOL 10 MG/ML IV BOLUS
INTRAVENOUS | Status: DC | PRN
  Administered 2017-05-17: 40 mg via INTRAVENOUS

## 2017-05-17 MED ORDER — POLYETHYLENE GLYCOL 3350 17 G PO PACK
17.0000 g | PACK | Freq: Two times a day (BID) | ORAL | Status: DC
  Administered 2017-05-17 – 2017-05-18 (×3): 17 g via ORAL
  Filled 2017-05-17 (×3): qty 1

## 2017-05-17 MED ORDER — LACTATED RINGERS IV SOLN
INTRAVENOUS | Status: DC | PRN
  Administered 2017-05-17: 11:00:00 via INTRAVENOUS

## 2017-05-17 MED ORDER — LACTATED RINGERS IV SOLN
INTRAVENOUS | Status: AC | PRN
  Administered 2017-05-17: 1000 mL via INTRAVENOUS

## 2017-05-17 MED ORDER — LIDOCAINE HCL (CARDIAC) 20 MG/ML IV SOLN
INTRAVENOUS | Status: DC | PRN
  Administered 2017-05-17: 80 mg via INTRAVENOUS

## 2017-05-17 MED ORDER — SODIUM CHLORIDE 0.9 % IV SOLN: INTRAVENOUS | Status: DC

## 2017-05-17 NOTE — Anesthesia Preprocedure Evaluation (Addendum)
Anesthesia Evaluation  Patient identified by MRN, date of birth, ID band Patient awake    Reviewed: Allergy & Precautions, NPO status , Patient's Chart, lab work & pertinent test results  Airway Mallampati: II  TM Distance: >3 FB Neck ROM: Full    Dental  (+) Teeth Intact, Dental Advisory Given, Caps   Pulmonary asthma , former smoker,    breath sounds clear to auscultation       Cardiovascular hypertension, Pt. on medications + dysrhythmias + pacemaker + Valvular Problems/Murmurs  Rhythm:Regular Rate:Normal     Neuro/Psych Seizures -,  PSYCHIATRIC DISORDERS Depression  Neuromuscular disease    GI/Hepatic negative GI ROS, Neg liver ROS,   Endo/Other  negative endocrine ROS  Renal/GU Renal disease     Musculoskeletal  (+) Arthritis ,   Abdominal   Peds  Hematology negative hematology ROS (+)   Anesthesia Other Findings Day of surgery medications reviewed with the patient.  Reproductive/Obstetrics                           Lab Results  Component Value Date   WBC 6.8 05/17/2017   HGB 10.1 (L) 05/17/2017   HCT 31.4 (L) 05/17/2017   MCV 88.2 05/17/2017   PLT 317 05/17/2017   Lab Results  Component Value Date   CREATININE 0.35 (L) 05/17/2017   BUN <5 (L) 05/17/2017   NA 135 05/17/2017   K 3.5 05/17/2017   CL 103 05/17/2017   CO2 27 05/17/2017   Echo: - Left ventricle: The cavity size was normal. Wall thickness was   increased in a pattern of mild LVH. Systolic function was normal.   The estimated ejection fraction was in the range of 55% to 60%.   Doppler parameters are consistent with both elevated ventricular   end-diastolic filling pressure and elevated left atrial filling   pressure. - Aortic valve: Valve area (Vmax): 2.59 cm^2. - Mitral valve: Valve area by continuity equation (using LVOT   flow): 2.07 cm^2. - Left atrium: The atrium was mildly dilated. - Atrial septum: No  defect or patent foramen ovale was identified. - Pericardium, extracardiac: A trivial pericardial effusion was   identified posterior to the heart.   Anesthesia Physical Anesthesia Plan  ASA: III  Anesthesia Plan: MAC   Post-op Pain Management:    Induction: Intravenous  PONV Risk Score and Plan: Propofol infusion  Airway Management Planned: Nasal Cannula and Natural Airway  Additional Equipment:   Intra-op Plan:   Post-operative Plan:   Informed Consent: I have reviewed the patients History and Physical, chart, labs and discussed the procedure including the risks, benefits and alternatives for the proposed anesthesia with the patient or authorized representative who has indicated his/her understanding and acceptance.     Plan Discussed with:   Anesthesia Plan Comments:         Anesthesia Quick Evaluation                                   Anesthesia Evaluation  Patient identified by MRN, date of birth, ID band Patient awake    Reviewed: Allergy & Precautions, NPO status , Patient's Chart, lab work & pertinent test results  Airway Mallampati: II  TM Distance: >3 FB Neck ROM: Full    Dental  (+) Teeth Intact, Dental Advisory Given, Caps   Pulmonary asthma , former smoker,    breath  sounds clear to auscultation       Cardiovascular hypertension, Pt. on medications and Pt. on home beta blockers negative cardio ROS  + dysrhythmias + pacemaker  Rhythm:Regular Rate:Normal     Neuro/Psych Seizures -,  PSYCHIATRIC DISORDERS Depression  Neuromuscular disease negative neurological ROS     GI/Hepatic negative GI ROS, Neg liver ROS,   Endo/Other  negative endocrine ROS  Renal/GU negative Renal ROS     Musculoskeletal  (+) Arthritis ,   Abdominal (+) + obese,   Peds  Hematology negative hematology ROS (+)   Anesthesia Other Findings Day of surgery medications reviewed with the patient.  Reproductive/Obstetrics                             Lab Results  Component Value Date   WBC 6.8 05/17/2017   HGB 10.1 (L) 05/17/2017   HCT 31.4 (L) 05/17/2017   MCV 88.2 05/17/2017   PLT 317 05/17/2017   Lab Results  Component Value Date   CREATININE 0.35 (L) 05/17/2017   BUN <5 (L) 05/17/2017   NA 135 05/17/2017   K 3.5 05/17/2017   CL 103 05/17/2017   CO2 27 05/17/2017   Lab Results  Component Value Date   INR 1.52 05/02/2017   INR 1.27 03/09/2017   EKG: Atrial sensed, Ventricle paced.  Echo: - Left ventricle: The cavity size was normal. Wall thickness was   increased in a pattern of mild LVH. Systolic function was normal.   The estimated ejection fraction was in the range of 55% to 60%.   Doppler parameters are consistent with both elevated ventricular   end-diastolic filling pressure and elevated left atrial filling   pressure. - Aortic valve: Valve area (Vmax): 2.59 cm^2. - Mitral valve: Valve area by continuity equation (using LVOT   flow): 2.07 cm^2. - Left atrium: The atrium was mildly dilated. - Atrial septum: No defect or patent foramen ovale was identified. - Pericardium, extracardiac: A trivial pericardial effusion was   identified posterior to the heart.   Anesthesia Physical Anesthesia Plan  ASA: III  Anesthesia Plan: General   Post-op Pain Management:    Induction: Intravenous  PONV Risk Score and Plan: 4 or greater and Ondansetron, Dexamethasone, Midazolam and Treatment may vary due to age or medical condition  Airway Management Planned: Oral ETT  Additional Equipment:   Intra-op Plan:   Post-operative Plan: Extubation in OR  Informed Consent: I have reviewed the patients History and Physical, chart, labs and discussed the procedure including the risks, benefits and alternatives for the proposed anesthesia with the patient or authorized representative who has indicated his/her understanding and acceptance.   Dental advisory given  Plan  Discussed with: CRNA  Anesthesia Plan Comments: (Per Cardiology recs, pacemaker will be placed in async mode. )       Anesthesia Quick Evaluation

## 2017-05-17 NOTE — H&P (View-Only) (Signed)
Referring Provider: Dr. Nanavati Primary Care Physician:  Kelly, William, MD Primary Gastroenterologist:  UNASSIGNED  Reason for Consultation:  Abdominal pain; Question of rectal prolapse  HPI: Sharon Gilmore is a 82 y.o. female with multiple medical problems who was recently was hospitalized for urosepsis and acute rectosigmoid colitis with postop ileus. She is s/p spinal fusion on 04/23/17. She has been having loose nonbloody stools the latter half of this month without abdominal pain until today. Marked distention today compared to admit per the patient and her 2 sons who are bedside. Reports a normal colonoscopy 3 years ago by Dr. Buccini. CT on 05/04/17 showed rectosigmoid colonic wall thickening as well thickening of the descending colon. Denies melena or hematochezia. In the ER she was noted to have a long string-like tissue projecting out of the rectum that was hyperpigmented.  Past Medical History:  Diagnosis Date  . Abnormal barium swallow 09/1999  . Arthritis   . Asthma   . Carpal tunnel syndrome   . Depression   . Erythema nodosum   . H/O Doppler ultrasound    LEFT LEG 11/1997  . Hypertension   . Impaired fasting glucose   . Presence of permanent cardiac pacemaker    [employer]  . Psoriasis   . Rosacea   . Seizures (HCC) 1980  . Swallowing disorder   . Systolic murmur     Past Surgical History:  Procedure Laterality Date  . BILATERAL CARPAL TUNNEL RELEASE    . CHOLECYSTECTOMY    . COLONOSCOPY  2004  . INCISIONAL HERNIA REPAIR    . LEFT HEART CATH AND CORONARY ANGIOGRAPHY N/A 03/15/2017   Procedure: Left Heart Cath and Coronary Angiography;  Surgeon: Berry, Jonathan J, MD;  Location: [facility];  Service: Cardiovascular;  Laterality: N/A;  . PACEMAKER IMPLANT N/A 03/09/2017   Procedure: Pacemaker Implant;  Surgeon: Allred, James, MD;  Location: [facility];  Service: Cardiovascular;  Laterality: N/A;  . TONSILLECTOMY      Prior to Admission medications    Medication Sig Start Date End Date Taking? Authorizing Provider  acetaminophen (TYLENOL) 500 MG tablet Take 1,000 mg by mouth 3 (three) times daily.   Yes [provider]  antiseptic oral rinse (BIOTENE) LIQD 15 mLs by Mouth Rinse route 3 (three) times daily before meals. spray   Yes [provider]  aspirin EC 81 MG tablet Take 81 mg by mouth at bedtime.    Yes [provider]  cyclobenzaprine (FLEXERIL) 5 MG tablet Take 5 mg by mouth 3 (three) times daily as needed for muscle spasms.   Yes [provider]  diltiazem (CARDIZEM CD) 180 MG 24 hr capsule Take 1 capsule (180 mg total) by mouth daily. 05/11/17  Yes Timberlake, Kathryn, MD  gabapentin (NEURONTIN) 100 MG capsule Take 1 capsule (100 mg total) by mouth 2 (two) times daily at 10 am and 4 pm. Patient taking differently: Take 100-300 mg by mouth See admin instructions. Take 100 mg at 10:00 am and 16:00  and 300 mg at bedtime 05/10/17  Yes Timberlake, Kathryn, MD  Hydrocortisone (GERHARDT'S BUTT CREAM) CREA Apply 1 application topically 4 (four) times daily as needed for irritation. 05/10/17  Yes Timberlake, Kathryn, MD  hydroxypropyl methylcellulose / hypromellose (ISOPTO TEARS / GONIOVISC) 2.5 % ophthalmic solution Place 1 drop into both eyes 4 (four) times daily as needed for dry eyes.   Yes [provider]  ipratropium-albuterol (DUONEB) 0.5-2.5 (3) MG/3ML SOLN Take 3 mLs by nebulization   See admin instructions. For five days started 05/12/2017   Yes [provider]  nystatin (MYCOSTATIN) 100000 UNIT/ML suspension Take 5 mLs by mouth 3 (three) times daily. For seven days starting 05/15/2017   Yes [provider]  saccharomyces boulardii (FLORASTOR) 250 MG capsule Take 1 capsule (250 mg total) by mouth 2 (two) times daily. 05/10/17  Yes Timberlake, Kathryn, MD  tamsulosin (FLOMAX) 0.4 MG CAPS capsule Take 1 capsule (0.4 mg total) by mouth daily after breakfast. 05/11/17  Yes Timberlake,  Kathryn, MD  traZODone (DESYREL) 50 MG tablet Take 1 tablet (50 mg total) by mouth at bedtime as needed for sleep. 05/10/17  Yes Timberlake, Kathryn, MD  Cholecalciferol (VITAMIN D3) 2000 units TABS Take 2,000 Units by mouth daily.     [provider]  gabapentin (NEURONTIN) 600 MG tablet Take 0.5 tablets (300 mg total) by mouth at bedtime. Patient not taking: Reported on 05/15/2017 05/10/17   Timberlake, Kathryn, MD  hydroxypropyl methylcellulose / hypromellose (ISOPTO TEARS / GONIOVISC) 2.5 % ophthalmic solution Place 1 drop into both eyes 4 (four) times daily as needed for dry eyes.    [provider]  traMADol (ULTRAM) 50 MG tablet Take 1 tablet (50 mg total) by mouth every 6 (six) hours as needed for moderate pain. Patient not taking: Reported on 05/15/2017 04/27/17   Stern, Joseph, MD    Scheduled Meds: . antiseptic oral rinse  15 mL Mouth Rinse TID AC  . enoxaparin (LOVENOX) injection  40 mg Subcutaneous Q24H  . haloperidol lactate  0.5 mg Intravenous Once  . [START ON 05/17/2017] Influenza vac split quadrivalent PF  0.5 mL Intramuscular Tomorrow-1000  .  morphine injection  1 mg Intravenous Once  . nystatin  5 mL Oral TID   Continuous Infusions: . dextrose 5 % and 0.45% NaCl 75 mL/hr at 05/16/17 0711  . potassium chloride 10 mEq (05/16/17 1308)   PRN Meds:.Gerhardt's butt cream, ondansetron (ZOFRAN) IV, polyvinyl alcohol  Allergies as of 05/15/2017 - Review Complete 05/15/2017  Allergen Reaction Noted  . Celebrex [celecoxib] Other (See Comments) 06/02/2016  . Codeine Nausea And Vomiting 06/02/2016  . Cymbalta [duloxetine hcl] Other (See Comments) 05/02/2017  . Doxycycline Swelling and Other (See Comments) 04/12/2017  . Meloxicam Other (See Comments) 06/02/2016  . Nortriptyline Other (See Comments) 06/02/2016  . Percocet [oxycodone-acetaminophen] Nausea And Vomiting 06/02/2016  . Penicillins Rash and Other (See Comments) 06/02/2016    Family History  Problem  Relation Age of Onset  . CVA Mother   . Stroke Mother   . Parkinson's disease Father   . Leukemia Brother   . CVA Maternal Grandmother   . Stroke Maternal Grandmother   . Peripheral vascular disease Maternal Grandfather   . Breast cancer Paternal Grandmother   . Diabetes Paternal Grandmother   . CVA Paternal Grandfather   . Stroke Paternal Grandfather   . Pancreatic cancer Paternal Aunt   . Stroke Paternal Aunt   . Lung cancer Paternal Uncle   . Pancreatic cancer Brother   . Coronary artery disease Brother     Social History   Social History  . Marital status: Widowed    Spouse name: N/A  . Number of children: 7  . Years of education: College   Occupational History  . RETIRED RN    Social History Main Topics  . Smoking status: Former Smoker    Quit date: 1982  . Smokeless tobacco: Never Used  . Alcohol use Yes     Comment: 1-2   times per week  . Drug use: No  . Sexual activity: Not on file   Other Topics Concern  . Not on file   Social History Narrative   Lives at home alone.   Right-handed.   Two cups caffeine per day.    Review of Systems: All negative except as stated above in HPI.  Physical Exam: Vital signs: Vitals:   05/16/17 0530 05/16/17 0622  BP: (!) 143/68 (!) 155/66  Pulse: 94 (!) 101  Resp: (!) 24 18  Temp:  99 F (37.2 C)  SpO2: 90% 94%   Last BM Date: 05/15/17 General:  Lethargic, elderly, frail, Well-developed, well-nourished, mild acute distress Head: normocephalic, atraumatic Eyes: anicteric sclera ENT: oropharynx clear Neck: supple, nontender Lungs:  Clear throughout to auscultation.   No wheezes, crackles, or rhonchi. No acute distress. Heart:  Regular rate and rhythm; no murmurs, clicks, rubs,  or gallops. Abdomen: diffuse tenderness with guarding, soft, +distention, +BS  Rectal:  Deferred Ext: 2+ pitting LE edema  GI:  Lab Results:  Recent Labs  05/15/17 2334 05/16/17 0016  WBC 7.8  --   HGB 9.7* 9.5*  HCT 30.4*  28.0*  PLT 324  --    BMET  Recent Labs  05/16/17 0016 05/16/17 1043  NA 137 134*  K 3.2* 3.3*  CL 98* 101  CO2  --  26  GLUCOSE 96 90  BUN <3* <5*  CREATININE 0.30* 0.35*  CALCIUM  --  7.5*   LFT  Recent Labs  05/16/17 1043  PROT 4.9*  ALBUMIN 1.7*  AST 26  ALT 18  ALKPHOS 71  BILITOT 0.7   PT/INR No results for input(s): LABPROT, INR in the last 72 hours.   Studies/Results: Dg Chest Port 1 View  Result Date: 05/15/2017 CLINICAL DATA:  Shortness of breath. EXAM: PORTABLE CHEST 1 VIEW COMPARISON:  Chest from abdominal radiographs 05/06/2017 FINDINGS: Left-sided pacemaker remains in place. Unchanged heart size and mediastinal contours with tortuous thoracic aorta. Low lung volumes persist. Persistent left lower lobe atelectasis and probable effusion. Slight improved right lung base aeration. No new consolidation, pulmonary edema or pneumothorax. IMPRESSION: Persistent low lung volumes with left lung base atelectasis and probable small effusion. Electronically Signed   By: Melanie  Ehinger M.D.   On: 05/15/2017 23:55   Dg Abd Portable 1 View  Result Date: 05/16/2017 CLINICAL DATA:  Concern for small bowel obstruction. Prolapsed rectum noted staff at rehab center. EXAM: PORTABLE ABDOMEN - 1 VIEW COMPARISON:  Most recent radiographs 05/08/2017. Most recent CT 05/04/2017 FINDINGS: Increased right-sided colonic stool from prior exams. Similar gaseous distention of transverse and descending colon and small bowel. No evidence of free air on single supine view, upper abdomen not included. Posterior lumbar fusion again seen. IMPRESSION: Increased stool in the right colon from prior, otherwise unchanged bowel gas pattern with mild gaseous distension of distal colon and small bowel. Suspect slow transit. No specific findings to suggest obstruction on the current exam. Electronically Signed   By: Melanie  Ehinger M.D.   On: 05/16/2017 00:00    Impression/Plan: Abdominal distention,  abdominal pain, and diarrhea concerning for ischemic colitis. Suspect tissue seen coming out of rectum yesterday was mucosal sloughing of possible necrotic tissue that was being expelled from the rectum. Agree that it is not consistent with rectal prolapse. Worsening distention and pain is concerning for worsened ischemia and a stat IV contrast CT ordered (I do not think she will be able to tolerate oral contrast). May need   a flexible sigmoidoscopy tomorrow but will have Dr. Edwards f/u on CT and decide whether to do a flex sig tomorrow to evaluate the rectosigmoid for ischemic bowel. Keep NPO.     LOS: 0 days   Adream Parzych C.  05/16/2017, 2:14 PM  Pager [phone]  AFTER 5 pm or on weekends please call [phone]  

## 2017-05-17 NOTE — Interval H&P Note (Signed)
History and Physical Interval Note:  05/17/2017 10:37 AM  Sharon Gilmore  has presented today for surgery, with the diagnosis of Abdominal pain; Colitis  The various methods of treatment have been discussed with the patient and family. After consideration of risks, benefits and other options for treatment, the patient has consented to  Procedure(s): FLEXIBLE SIGMOIDOSCOPY (N/A) as a surgical intervention .  The patient's history has been reviewed, patient examined, no change in status, stable for surgery.  I have reviewed the patient's chart and labs.  Questions were answered to the patient's satisfaction.     Marcine Gadway JR,Deone Omahoney L

## 2017-05-17 NOTE — Progress Notes (Signed)
Family Medicine Teaching Service Daily Progress Note Intern Pager: [phone]  Patient name: Sharon Gilmore Medical record number: [id number] Date of birth: 21-Jul-1934 Age: 83 y.o. Gender: female  Primary Care Provider: Olivia Canter, MD Consultants: Gastroenterology Code Status: full  Pt Overview and Major Events to Date:  83 year old who presents with presumed sloughing of mucosa out of rectum. Patient with recent history of urosepsis and acute rectosigmoid colitis with a post-op ileus. Patient with sloughing resected in ed and drained.  CT scan at admission consistent with rectosigmoid colonic inflammation. Patient to undergo flex-sig by GI today to evaluate.  Assessment and Plan: Rectosigmoid inflammation Most likely etiology of mucosal sloughing is ischemic colitis given distribution and presentation of symptoms. Patient with wbc of 6.8, afebrile, intermittent tachycardia. Another source of the colitis could be infectious but this is unlikely given history and lack of symptoms. Autoimmune very low on differential given age and lack of associated symptoms. Patient to undergo flex sig today for better evaluation. GI consulting appreciate their recs. - Follow up results of flex sig - continue npo - continue d5 1/2ns @ 75 mL/hr - pain control with morphine, gabapentin - Follow up GI recommendations - Miralax 17g bid - cbc, bmp in am  Lower extremity swelling Most likely etiology is hypoalbummenemia (1.7). Will need to correct nutritional status one patient is over inflammatory colitis. Compression stockings and leg elevation as needed. No indication for diuresis at this time. - nutrition consult - advance diet as tolerated after flex sig - supplement with ensures after procedure - continue d5 1/2 ns  Hypokalemia 3.2 on admission. Has resolved. - bmp in am  Hx of spinal surgery: recent spinal surgery with Dr. Venetia Maxon (L2-L5 decompression and fusion) on 04/23/17. Prior to this, lived  in independent apartment at [facility]. D/c to SNF for rehab. No signs of infection or bleeding over site, patient denies back pain. -monitor on exam  HTN: home meds are enalapril, HCTZ, lopressor. BP on admission 153/73. - hold home meds until scope - PRN metoprolol for SBP >180  Hx of high degree AV block, s/p pacemaker: pacer placed 03/08/17. EKG in ED with paced rhythm and new T wave inversions in inferior and lateral leads, s/p ASA . - monitor on tele  Asthma: patient reports no inhalers for several years at previous admission. Endorsing some SOB with non productive cough since this morning around 4am. Was given breathing treatment at SNF. Sating well on RA. -monitor O2 sats on vitals  HLD: home med is lipitor . Hold until scope. -hold home lipitor  FEN/GI: npo, d5 1/2ns at 75 mL/hr PPx: miralax, lovenox  Disposition: snf when able  Subjective:  Doing much better today. Had multiple bm's yesterday after feeling more bloated.   Objective: Temp:  [97.8 F (36.6 C)-98.7 F (37.1 C)] 97.9 F (36.6 C) (10/01 1419) Pulse Rate:  [79-110] 91 (10/01 1419) Resp:  [18-23] 20 (10/01 1419) BP: (95-160)/(28-77) 130/59 (10/01 1419) SpO2:  [92 %-97 %] 92 % (10/01 1419) Physical Exam  Constitutional: She is oriented to person, place, and time and well-developed, well-nourished, and in no distress. No distress.  HENT:  Head: Normocephalic and atraumatic.  Right Ear: External ear normal.  Left Ear: External ear normal.  Eyes: Pupils are equal, round, and reactive to light. EOM are normal.  Neck: Normal range of motion. No thyromegaly present.  Cardiovascular: Normal rate, regular rhythm, normal heart sounds and intact distal pulses.   Pulmonary/Chest: Effort normal. No respiratory distress.  She has no wheezes.  Abdominal: Soft. Bowel sounds are normal. She exhibits no distension. There is no tenderness. There is no rebound.  Musculoskeletal: Normal range of motion. She  exhibits no edema or deformity.  Neurological: She is alert and oriented to person, place, and time.  Skin: Skin is warm. She is not diaphoretic.  Psychiatric: Affect and judgment normal.    Laboratory:  Recent Labs Lab 05/15/17 2334 05/16/17 0016 05/17/17 0633  WBC 7.8  --  6.8  HGB 9.7* 9.5* 10.1*  HCT 30.4* 28.0* 31.4*  PLT 324  --  317    Recent Labs Lab 05/16/17 0016 05/16/17 1043 05/17/17 0633  NA 137 134* 135  K 3.2* 3.3* 3.5  CL 98* 101 103  CO2  --  26 27  BUN <3* <5* <5*  CREATININE 0.30* 0.35* 0.35*  CALCIUM  --  7.5* 7.6*  PROT  --  4.9*  --   BILITOT  --  0.7  --   ALKPHOS  --  71  --   ALT  --  18  --   AST  --  26  --   GLUCOSE 96 90 102*    Imaging/Diagnostic Tests: CLINICAL DATA:  Abdominal pain and diarrhea.  EXAM: CT ABDOMEN AND PELVIS WITH CONTRAST  TECHNIQUE: Multidetector CT imaging of the abdomen and pelvis was performed using the standard protocol following bolus administration of intravenous contrast.  CONTRAST:  ISOVUE-300 IOPAMIDOL (ISOVUE-300) INJECTION 61%  COMPARISON:  CT abdomen pelvis 05/04/2017  FINDINGS: Lower chest: Bilateral pleural effusions have increased in size, now medium-sized on the right and small on the left. There is associated bibasilar atelectasis. No pericardial effusion.  Hepatobiliary: Normal hepatic contours and density. No visible biliary dilatation. Status post cholecystectomy.  Pancreas: Normal contours without ductal dilatation. No peripancreatic fluid collection.  Spleen: Normal.  Adrenals/Urinary Tract:  --Adrenal glands: Normal.  --Right kidney/ureter: No hydronephrosis or perinephric stranding. No nephrolithiasis. No obstructing ureteral stones.  --Left kidney/ureter: No hydronephrosis or perinephric stranding. No nephrolithiasis. No obstructing ureteral stones.  --Urinary bladder: Unremarkable.  Stomach/Bowel:  --Stomach/Duodenum: No hiatal hernia or other  gastric abnormality. Normal duodenal course and caliber.  --Small bowel: No dilatation or inflammation.  --Colon: There is hyperemia and wall thickening of the descending colon and the rectum. There is relative sparing of the sigmoid colon and the proximal colon. No pneumatosis intestinalis or free intraperitoneal air. No intraperitoneal abscess. Trace free fluid in the pelvis.  --Appendix: Normal.  Vascular/Lymphatic: There is atherosclerotic calcification of the non aneurysmal abdominal aorta. Celiac axis, superior mesenteric artery and inferior mesenteric arteries are patent. The portal vein, splenic vein and superior mesenteric vein are patent. No abdominal or pelvic lymphadenopathy.  Reproductive: Normal uterus and ovaries.  Musculoskeletal. Posterior spinal fusion at L2-L5.  Other: None.  IMPRESSION: 1. Hyperemia and wall thickening of the descending colon and the rectum, suggesting inflammatory or infectious colitis. The distribution is unchanged compared to the prior study. There is trace free fluid in the pelvis but no abscess, pneumatosis or free intraperitoneal air. 2. Increased medium-sized right and small left pleural effusions with associated atelectasis.  Myrene Buddy, MD 05/17/2017, 2:26 PM PGY-1, [facility] Family Medicine FPTS Intern pager: [phone], text pages welcome

## 2017-05-17 NOTE — Anesthesia Postprocedure Evaluation (Signed)
Anesthesia Post Note  Patient: Sharon Gilmore  Procedure(s) Performed: FLEXIBLE SIGMOIDOSCOPY (N/A )     Patient location during evaluation: PACU Anesthesia Type: MAC Level of consciousness: awake and alert Pain management: pain level controlled Vital Signs Assessment: post-procedure vital signs reviewed and stable Respiratory status: spontaneous breathing, nonlabored ventilation, respiratory function stable and patient connected to nasal cannula oxygen Cardiovascular status: stable and blood pressure returned to baseline Postop Assessment: no apparent nausea or vomiting Anesthetic complications: no    Last Vitals:  Vitals:   05/17/17 1113 05/17/17 1125  BP: (!) 95/34 (!) 125/28  Pulse: 82 80  Resp: (!) 23 (!) 22  Temp: 37.1 C   SpO2: 96% 93%    Last Pain:  Vitals:   05/17/17 1113  TempSrc: Oral  PainSc:                  Effie Berkshire

## 2017-05-17 NOTE — Op Note (Signed)
[facility] Patient Name: Raylynn Hersh Procedure Date : 05/17/2017 MRN: [id number] Attending MD: Nancy Fetter Dr., MD Date of Birth: 10-29-1933 CSN: [id number] Age: 83 Admit Type: Inpatient Procedure:                Flexible Sigmoidoscopy Indications:              Lower abdominal pain, Diarrhea, Abnormal CT of the                            GI tract Providers:                Jeneen Rinks L. Randon Somera Dr., MD, Zenon Mayo, RN, Tinnie Gens, Technician, Raphael Gibney, CRNA Referring MD:              Medicines:                Monitored Anesthesia Care Complications:            No immediate complications. Estimated Blood Loss:     Estimated blood loss was minimal. Procedure:                Pre-Anesthesia Assessment:                           - Prior to the procedure, a History and Physical                            was performed, and patient medications and                            allergies were reviewed. The patient's tolerance of                            previous anesthesia was also reviewed. The risks                            and benefits of the procedure and the sedation                            options and risks were discussed with the patient.                            All questions were answered, and informed consent                            was obtained. Prior Anticoagulants: The patient has                            taken no previous anticoagulant or antiplatelet                            agents. ASA Grade Assessment: III - A patient with  severe systemic disease. After reviewing the risks                            and benefits, the patient was deemed in                            satisfactory condition to undergo the procedure.                           After obtaining informed consent, the scope was                            passed under direct vision. The EC-[id number] ([id number])            scope was introduced through the anus and advanced                            to the the sigmoid colon. The flexible                            sigmoidoscopy was accomplished without difficulty.                            The patient tolerated the procedure well. The                            quality of the bowel preparation was fair. Scope In: Scope Out: Findings:      The perianal and digital rectal examinations were normal.      A continuous area of bleeding ulcerated mucosa was present in the       rectum, in the recto-sigmoid colon and in the distal sigmoid colon.       Biopsies were taken with a cold forceps for histology. Impression:               - Preparation of the colon was fair.                           - Mucosal ulceration. Biopsied. this was confluent                            ulceration probably due to ischemic colitis. Moderate Sedation:      MAC by anesthesia Recommendation:           - Return patient to hospital ward for ongoing care.                           - Full liquid diet.                           - Use Miralax 1 capful (17 grams) in 8 ounces of                            water PO BID. Procedure Code(s):        --- Professional ---  08676, Sigmoidoscopy, flexible; with biopsy, single                            or multiple Diagnosis Code(s):        --- Professional ---                           K63.3, Ulcer of intestine                           R10.30, Lower abdominal pain, unspecified                           R19.7, Diarrhea, unspecified                           R93.3, Abnormal findings on diagnostic imaging of                            other parts of digestive tract CPT copyright 2016 [employer]. All rights reserved. The codes documented in this report are preliminary and upon coder review may  be revised to meet current compliance requirements. Nancy Fetter Dr., MD 05/17/2017 11:29:16 AM This  report has been signed electronically. Number of Addenda: 0

## 2017-05-17 NOTE — Transfer of Care (Signed)
Immediate Anesthesia Transfer of Care Note  Patient: Sharon Gilmore  Procedure(s) Performed: FLEXIBLE SIGMOIDOSCOPY (N/A )  Patient Location: Endoscopy Unit  Anesthesia Type:MAC  Level of Consciousness: awake, alert  and oriented  Airway & Oxygen Therapy: Patient Spontanous Breathing and Patient connected to nasal cannula oxygen  Post-op Assessment: Report given to RN and Post -op Vital signs reviewed and stable  Post vital signs: Reviewed and stable  Last Vitals:  Vitals:   05/17/17 0452 05/17/17 0956  BP: (!) 138/59 (!) 160/58  Pulse: 79 88  Resp: 18 (!) 21  Temp: 36.8 C 36.6 C  SpO2: 96% 95%    Last Pain:  Vitals:   05/17/17 0956  TempSrc: Oral  PainSc:          Complications: No apparent anesthesia complications

## 2017-05-18 ENCOUNTER — Encounter (HOSPITAL_COMMUNITY): Payer: Self-pay | Admitting: Gastroenterology

## 2017-05-18 MED ORDER — POLYETHYLENE GLYCOL 3350 17 G PO PACK: 17.0000 g | PACK | Freq: Two times a day (BID) | ORAL | 0 refills | Status: DC

## 2017-05-18 MED ORDER — LORAZEPAM 2 MG/ML IJ SOLN
0.5000 mg | Freq: Once | INTRAMUSCULAR | Status: AC
  Administered 2017-05-18: 0.5 mg via INTRAVENOUS
  Filled 2017-05-18: qty 1

## 2017-05-18 NOTE — Progress Notes (Signed)
Patient will DC to: [facility] Anticipated DC date: 05/18/17 Family notified: Sons Transport by: Sharin Mons 4:30pm requested by RN   Per MD patient ready for DC to [facility]. RN, patient, patient's family, and facility notified of DC. Discharge Summary sent to facility. RN given number for report [phone]). DC packet on chart. Ambulance transport requested for patient.   CSW signing off.  Cristobal Goldmann, [place] Clinical Social Worker [phone]

## 2017-05-18 NOTE — Evaluation (Signed)
Physical Therapy Evaluation Patient Details Name: Sharon Gilmore MRN: [id number] DOB: 05/30/1934 Today's Date: 05/18/2017   History of Present Illness  83 y.o. female with multiple medical problems who was recently was hospitalized for urosepsis and acute rectosigmoid colitis with postop ileus. She is s/p spinal fusion on 04/23/17. Admitted with colitis with additional history of depression, pacemaker and swallowing disorder  Clinical Impression  Pt pleasant and willing to mobilize in hopes of D/C today with transport via son to [place]. Pt with decreased strength, transfers and safety who will benefit from acute therapy to maximize mobility, function and independence to decrease burden of care. Pt incontinent of stool with each movement with assist for pericare and educated for need for adult brief for increased mobility as well as need for brace to be present. Pt very fatigued end of session and discussed the activity requirement to dress and transfer via private vehicle and that PTAR transportation may be more beneficial from an energy conservation stand point and left pt to discuss with son.     Follow Up Recommendations SNF;Supervision/Assistance - 24 hour    Equipment Recommendations  None recommended by PT    Recommendations for Other Services       Precautions / Restrictions Precautions Precautions: Back;Fall Precaution Comments: incontinent stool Required Braces or Orthoses: Spinal Brace Spinal Brace: Lumbar corset;Applied in sitting position      Mobility  Bed Mobility Overal bed mobility: Needs Assistance Bed Mobility: Rolling;Sidelying to Sit;Sit to Sidelying Rolling: Min assist Sidelying to sit: Min assist     Sit to sidelying: Mod assist General bed mobility comments: cues for sequence with assist to fully rotate pelvis to roll, assist to elevate trunk and bring legs off of bed as well bring legs back onto bed for sidelying  Transfers Overall transfer level:  Needs assistance   Transfers: Sit to/from Stand Sit to Stand: Min assist         General transfer comment: min assist x 3 trials with cues for safety and hand placement. Pt sidestepped 8" to [facility].  Ambulation/Gait             General Gait Details: unable due to fatigue and brace not present  Stairs            Wheelchair Mobility    Modified Rankin (Stroke Patients Only)       Balance Overall balance assessment: Needs assistance   Sitting balance-Leahy Scale: Good       Standing balance-Leahy Scale: Poor                               Pertinent Vitals/Pain Pain Score: 5  Pain Location: bil LE with movement, generalized weakness and ache Pain Descriptors / Indicators: Aching Pain Intervention(s): Limited activity within patient's tolerance;Repositioned    Home Living Family/patient expects to be discharged to:: Skilled nursing facility                 Additional Comments: From [facility] SNF (due to recent back surgery 9/7).    Prior Function Level of Independence: Needs assistance   Gait / Transfers Assistance Needed: Walking short distances at rehab 12' with assist; Prior to surgery. MOd I with RW.           Hand Dominance        Extremity/Trunk Assessment   Upper Extremity Assessment Upper Extremity Assessment: Generalized weakness    Lower Extremity Assessment Lower Extremity Assessment:  Generalized weakness    Cervical / Trunk Assessment Cervical / Trunk Exceptions: s/p back sx Sept 7  Communication   Communication: No difficulties  Cognition Arousal/Alertness: Awake/alert Behavior During Therapy: WFL for tasks assessed/performed Overall Cognitive Status: Within Functional Limits for tasks assessed                                        General Comments      Exercises     Assessment/Plan    PT Assessment Patient needs continued PT services  PT Problem List Decreased strength;Decreased  activity tolerance;Decreased balance;Decreased mobility;Decreased knowledge of use of DME;Decreased knowledge of precautions;Pain;Decreased cognition       PT Treatment Interventions DME instruction;Gait training;Therapeutic exercise;Functional mobility training;Therapeutic activities;Balance training;Patient/family education;Neuromuscular re-education    PT Goals (Current goals can be found in the Care Plan section)  Acute Rehab PT Goals Patient Stated Goal: be able to walk PT Goal Formulation: With patient Time For Goal Achievement: 06/01/17 Potential to Achieve Goals: Fair    Frequency Min 3X/week   Barriers to discharge        Co-evaluation               AM-PAC PT "6 Clicks" Daily Activity  Outcome Measure Difficulty turning over in bed (including adjusting bedclothes, sheets and blankets)?: Unable Difficulty moving from lying on back to sitting on the side of the bed? : Unable Difficulty sitting down on and standing up from a chair with arms (e.g., wheelchair, bedside commode, etc,.)?: Unable Help needed moving to and from a bed to chair (including a wheelchair)?: A Lot Help needed walking in hospital room?: A Lot Help needed climbing 3-5 steps with a railing? : Total 6 Click Score: 8    End of Session   Activity Tolerance: Patient tolerated treatment well Patient left: in bed;with call bell/phone within reach;with family/visitor present Nurse Communication: Mobility status;Precautions PT Visit Diagnosis: Muscle weakness (generalized) (M62.81);Other abnormalities of gait and mobility (R26.89);Difficulty in walking, not elsewhere classified (R26.2)    Time: 1610-9604 PT Time Calculation (min) (ACUTE ONLY): 22 min   Charges:   PT Evaluation $PT Eval Moderate Complexity: 1 Mod     PT G Codes:        Delaney Meigs, PT [phone]   Kirah Stice B Jennalee Greaves 05/18/2017, 2:27 PM

## 2017-05-18 NOTE — Clinical Social Work Note (Signed)
Clinical Social Work Assessment  Patient Details  Name: Sharon Gilmore MRN: [id number] Date of Birth: 1933-10-17  Date of referral:  05/18/17               Reason for consult:  Discharge Planning                Permission sought to share information with:  Facility Medical sales representative, Family Supports Permission granted to share information::  Yes, Verbal Permission Granted  Name::     Myrtha Mantis  Agency::  [facility]  Relationship::  Children  Contact Information:     Housing/Transportation Living arrangements for the past 2 months:  Skilled Building surveyor of Information:  Patient, Adult Children, Facility Patient Interpreter Needed:  None Criminal Activity/Legal Involvement Pertinent to Current Situation/Hospitalization:  No - Comment as needed Significant Relationships:  Adult Children Lives with:  Facility Resident Do you feel safe going back to the place where you live?  Yes Need for family participation in patient care:  No (Coment)  Care giving concerns:  CSW received consult regarding discharge planning. Patient's son at bedside. Patient resides at [facility] and plans to return there at discharge. CSW to continue to follow and assist with discharge planning needs.   Social Worker assessment / plan:  CSW spoke with patient concerning return to SNF at discharge.   Employment status:  Retired Health and safety inspector:  Medicare PT Recommendations:  Not assessed at this time Information / Referral to community resources:  Skilled Nursing Facility  Patient/Family's Response to care:  Patient expressed agreement with discharge plan. Patient did not like PTAR the last admission, so she would like to go with her son by car. Patient stated she would like to be able to discharge today. CSW will follow up with physicians.   Patient/Family's Understanding of and Emotional Response to Diagnosis, Current Treatment, and Prognosis:  Patient/family is realistic  regarding therapy needs and expressed being hopeful for SNF placement. Patient expressed understanding of CSW role and discharge process as well as her medical condition. She expressed being less than thrilled with being back at the hospital. No questions/concerns about plan or treatment.    Emotional Assessment Appearance:  Appears stated age Attitude/Demeanor/Rapport:  Other (Appropriate) Affect (typically observed):  Accepting, Appropriate Orientation:  Oriented to Self, Oriented to Situation, Oriented to Place, Oriented to  Time Alcohol / Substance use:  Not Applicable Psych involvement (Current and /or in the community):  No (Comment)  Discharge Needs  Concerns to be addressed:  Care Coordination Readmission within the last 30 days:  Yes Current discharge risk:  None Barriers to Discharge:  Continued Medical Work up   [employer], LCSWA 05/18/2017, 10:32 AM

## 2017-05-18 NOTE — Progress Notes (Signed)
[facility] PROGRESS NOTE Subjective patient without any significant bleeding. Her hemoglobin is stable. She continues to have loose stools particularly with the Miralax.  Objective: Vital signs in last 24 hours: Temp:  [97.3 F (36.3 C)-97.9 F (36.6 C)] 97.5 F (36.4 C) (10/02 0628) Pulse Rate:  [91-93] 93 (10/02 0628) Resp:  [16-20] 16 (10/02 0628) BP: (130-151)/(59-69) 151/62 (10/02 0628) SpO2:  [92 %-94 %] 94 % (10/02 0628) Last BM Date: 05/18/17  Intake/Output from previous day: 10/01 0701 - 10/02 0700 In: 675 [I.V.:675] Out: 900 [Urine:900] Intake/Output this shift: Total I/O In: 120 [P.O.:120] Out: 650 [Urine:650]  PE:  Abdomen--soft  nontender  Lab Results:  Recent Labs  05/15/17 2334 05/16/17 0016 05/17/17 0633  WBC 7.8  --  6.8  HGB 9.7* 9.5* 10.1*  HCT 30.4* 28.0* 31.4*  PLT 324  --  317   BMET  Recent Labs  05/16/17 0016 05/16/17 1043 05/17/17 0633  NA 137 134* 135  K 3.2* 3.3* 3.5  CL 98* 101 103  CO2  --  26 27  CREATININE 0.30* 0.35* 0.35*   LFT  Recent Labs  05/16/17 1043  PROT 4.9*  AST 26  ALT 18  ALKPHOS 71  BILITOT 0.7   PT/INR No results for input(s): LABPROT, INR in the last 72 hours. PANCREAS  Recent Labs  05/16/17 1745  LIPASE 26         Studies/Results: Ct Abdomen Pelvis W Contrast  Result Date: 05/16/2017 CLINICAL DATA:  Abdominal pain and diarrhea. EXAM: CT ABDOMEN AND PELVIS WITH CONTRAST TECHNIQUE: Multidetector CT imaging of the abdomen and pelvis was performed using the standard protocol following bolus administration of intravenous contrast. CONTRAST:  ISOVUE-300 IOPAMIDOL (ISOVUE-300) INJECTION 61% COMPARISON:  CT abdomen pelvis 05/04/2017 FINDINGS: Lower chest: Bilateral pleural effusions have increased in size, now medium-sized on the right and small on the left. There is associated bibasilar atelectasis. No pericardial effusion. Hepatobiliary: Normal hepatic contours and density.  No visible biliary dilatation. Status post cholecystectomy. Pancreas: Normal contours without ductal dilatation. No peripancreatic fluid collection. Spleen: Normal. Adrenals/Urinary Tract: --Adrenal glands: Normal. --Right kidney/ureter: No hydronephrosis or perinephric stranding. No nephrolithiasis. No obstructing ureteral stones. --Left kidney/ureter: No hydronephrosis or perinephric stranding. No nephrolithiasis. No obstructing ureteral stones. --Urinary bladder: Unremarkable. Stomach/Bowel: --Stomach/Duodenum: No hiatal hernia or other gastric abnormality. Normal duodenal course and caliber. --Small bowel: No dilatation or inflammation. --Colon: There is hyperemia and wall thickening of the descending colon and the rectum. There is relative sparing of the sigmoid colon and the proximal colon. No pneumatosis intestinalis or free intraperitoneal air. No intraperitoneal abscess. Trace free fluid in the pelvis. --Appendix: Normal. Vascular/Lymphatic: There is atherosclerotic calcification of the non aneurysmal abdominal aorta. Celiac axis, superior mesenteric artery and inferior mesenteric arteries are patent. The portal vein, splenic vein and superior mesenteric vein are patent. No abdominal or pelvic lymphadenopathy. Reproductive: Normal uterus and ovaries. Musculoskeletal. Posterior spinal fusion at L2-L5. Other: None. IMPRESSION: 1. Hyperemia and wall thickening of the descending colon and the rectum, suggesting inflammatory or infectious colitis. The distribution is unchanged compared to the prior study. There is trace free fluid in the pelvis but no abscess, pneumatosis or free intraperitoneal air. 2. Increased medium-sized right and small left pleural effusions with associated atelectasis. Electronically Signed   By: Deatra Robinson M.D.   On: 05/16/2017 17:20    Medications: I have reviewed the patient's current medications.  Assessment:  1. Acute colitis Probably ischemic      Plan:  Should be Ok to  send home and f/u in 1 month with Dr Matthias Hughs   Marciel Offenberger JR,Zarea Diesing L 05/18/2017, 12:36 PM  This note was created using voice recognition software. Minor errors may Have occurred unintentionally.  Pager: [phone] If no answer or after hours call [phone]

## 2017-05-18 NOTE — Progress Notes (Signed)
Pathology shows ulceration granulation tissue. Discuss with patient son. They're concerned about her leaving the hospital because they do not feel that she is doing well and for that reason it may be better to watch her overnight. Page the intern was unable to get him.

## 2017-05-18 NOTE — Progress Notes (Addendum)
Family Medicine Teaching Service Daily Progress Note Intern Pager: [phone]  Patient name: Sharon Gilmore Medical record number: [id number] Date of birth: May 16, 1934 Age: 83 y.o. Gender: female  Primary Care Provider: Olivia Canter, MD Consultants: Gastroenterology Code Status: full  Pt Overview and Major Events to Date:  83 year old who presents with presumed sloughing of mucosa out of rectum. Patient with recent history of urosepsis and acute rectosigmoid colitis with a post-op ileus. Patient with sloughing resected in ed and drained.  CT scan at admission consistent with rectosigmoid colonic inflammation. Biopsy showing ulceration granulation tissue.  Assessment and Plan: Rectosigmoid inflammation Most likely etiology of mucosal sloughing is ischemic colitis given distribution and presentation of symptoms. Biopsy showing ulcerative granulation tissue consistent with ischemic colitis. Other etiologies are infectious and autoimmune but these are very unlikely given presentation and age. Patient with wbc of 6.8, afebrile, intermittent tachycardia. GI consulting appreciate their recs. Patient is feeling better and is appropriate for discharge. Can d/c today with fu/u with Dr. Matthias Hughs in 1 month. -miralax 17g bid - f/u with dr. Matthias Hughs in 1 month - f/u with pcp in 1 week for hospital follow up  Lower extremity swelling Most likely etiology is hypoalbummenemia (1.7). Will need to correct nutritional status one patient is over inflammatory colitis. Compression stockings and leg elevation as needed. No indication for diuresis at this time. - nutrition recommendations  - regular diet as tolerated - supplement with ensures after procedure - continue d5 1/2 ns  Hypokalemia 3.2 on admission. Has resolved. - bmp in am  Hx of spinal surgery: recent spinal surgery with Dr. Venetia Maxon (L2-L5 decompression and fusion) on 04/23/17. Prior to this, lived in independent apartment at [place]. D/c to SNF  for rehab. No signs of infection or bleeding over site, patient denies back pain. -monitor on exam  HTN: home meds are enalapril, HCTZ, lopressor. BP on admission 153/73. - hold home meds until scope - PRN metoprolol for SBP >180  Hx of high degree AV block, s/p pacemaker: pacer placed 03/08/17. EKG in ED with paced rhythm and new T wave inversions in inferior and lateral leads, s/p ASA . - monitor on tele  Asthma: patient reports no inhalers for several years at previous admission. Endorsing some SOB with non productive cough since this morning around 4am. Was given breathing treatment at SNF. Sating well on RA. -monitor O2 sats on vitals  HLD: home med is lipitor . Hold until scope. -hold home lipitor  FEN/GI: npo, d5 1/2ns at 75 mL/hr PPx: miralax, lovenox  Disposition: snf when able  Subjective:  Doing well this morning. Feels as though she is back to baseline and is ready for discharge.   Objective: Temp:  [97.3 F (36.3 C)-98.2 F (36.8 C)] 98.2 F (36.8 C) (10/02 1331) Pulse Rate:  [91-93] 92 (10/02 1331) Resp:  [16-24] 24 (10/02 1331) BP: (130-160)/(59-75) 160/75 (10/02 1331) SpO2:  [92 %-95 %] 95 % (10/02 1331) Physical Exam  Constitutional: She is oriented to person, place, and time and well-developed, well-nourished, and in no distress. No distress.  HENT:  Head: Normocephalic and atraumatic.  Right Ear: External ear normal.  Left Ear: External ear normal.  Eyes: Pupils are equal, round, and reactive to light. EOM are normal.  Neck: Normal range of motion. No thyromegaly present.  Cardiovascular: Normal rate, regular rhythm, normal heart sounds and intact distal pulses.   Pulmonary/Chest: Effort normal. No respiratory distress. She has no wheezes.  Abdominal: Soft. Bowel sounds are normal.  She exhibits no distension. There is no tenderness. There is no rebound.  Musculoskeletal: Normal range of motion. She exhibits no edema or deformity.   Neurological: She is alert and oriented to person, place, and time.  Skin: Skin is warm. She is not diaphoretic.  Psychiatric: Affect and judgment normal.    Laboratory:  Recent Labs Lab 05/15/17 2334 05/16/17 0016 05/17/17 0633  WBC 7.8  --  6.8  HGB 9.7* 9.5* 10.1*  HCT 30.4* 28.0* 31.4*  PLT 324  --  317    Recent Labs Lab 05/16/17 0016 05/16/17 1043 05/17/17 0633  NA 137 134* 135  K 3.2* 3.3* 3.5  CL 98* 101 103  CO2  --  26 27  BUN <3* <5* <5*  CREATININE 0.30* 0.35* 0.35*  CALCIUM  --  7.5* 7.6*  PROT  --  4.9*  --   BILITOT  --  0.7  --   ALKPHOS  --  71  --   ALT  --  18  --   AST  --  26  --   GLUCOSE 96 90 102*    Imaging/Diagnostic Tests: CLINICAL DATA:  Abdominal pain and diarrhea.  EXAM: CT ABDOMEN AND PELVIS WITH CONTRAST  TECHNIQUE: Multidetector CT imaging of the abdomen and pelvis was performed using the standard protocol following bolus administration of intravenous contrast.  CONTRAST:  ISOVUE-300 IOPAMIDOL (ISOVUE-300) INJECTION 61%  COMPARISON:  CT abdomen pelvis 05/04/2017  FINDINGS: Lower chest: Bilateral pleural effusions have increased in size, now medium-sized on the right and small on the left. There is associated bibasilar atelectasis. No pericardial effusion.  Hepatobiliary: Normal hepatic contours and density. No visible biliary dilatation. Status post cholecystectomy.  Pancreas: Normal contours without ductal dilatation. No peripancreatic fluid collection.  Spleen: Normal.  Adrenals/Urinary Tract:  --Adrenal glands: Normal.  --Right kidney/ureter: No hydronephrosis or perinephric stranding. No nephrolithiasis. No obstructing ureteral stones.  --Left kidney/ureter: No hydronephrosis or perinephric stranding. No nephrolithiasis. No obstructing ureteral stones.  --Urinary bladder: Unremarkable.  Stomach/Bowel:  --Stomach/Duodenum: No hiatal hernia or other gastric abnormality. Normal  duodenal course and caliber.  --Small bowel: No dilatation or inflammation.  --Colon: There is hyperemia and wall thickening of the descending colon and the rectum. There is relative sparing of the sigmoid colon and the proximal colon. No pneumatosis intestinalis or free intraperitoneal air. No intraperitoneal abscess. Trace free fluid in the pelvis.  --Appendix: Normal.  Vascular/Lymphatic: There is atherosclerotic calcification of the non aneurysmal abdominal aorta. Celiac axis, superior mesenteric artery and inferior mesenteric arteries are patent. The portal vein, splenic vein and superior mesenteric vein are patent. No abdominal or pelvic lymphadenopathy.  Reproductive: Normal uterus and ovaries.  Musculoskeletal. Posterior spinal fusion at L2-L5.  Other: None.  IMPRESSION: 1. Hyperemia and wall thickening of the descending colon and the rectum, suggesting inflammatory or infectious colitis. The distribution is unchanged compared to the prior study. There is trace free fluid in the pelvis but no abscess, pneumatosis or free intraperitoneal air. 2. Increased medium-sized right and small left pleural effusions with associated atelectasis.  Myrene Buddy, MD 05/18/2017, 2:18 PM PGY-1, [facility] Family Medicine FPTS Intern pager: [phone], text pages welcome

## 2017-05-18 NOTE — NC FL2 (Signed)
[place] MEDICAID FL2 LEVEL OF CARE SCREENING TOOL     IDENTIFICATION  Patient Name: Sharon Gilmore Birthdate: 1934-08-16 Sex: female Admission Date (Current Location): 05/15/2017  [facility] and [place] Number:  Producer, television/film/video and Address:  [facility] [place], [address], [place], [place] [address]      Provider Number: [id number]  Attending Physician Name and Address:  Carney Living, MD  Relative Name and Phone Number:       Current Level of Care: Hospital Recommended Level of Care: Skilled Nursing Facility Prior Approval Number:    Date Approved/Denied:   PASRR Number: [id number]  Discharge Plan: SNF    Current Diagnoses: Patient Active Problem List   Diagnosis Date Noted  . Rectal abnormality 05/16/2017  . SBO (small bowel obstruction) (HCC)   . Atrial fibrillation with rapid ventricular response (HCC)   . Second degree AV block, Mobitz type II   . Colitis   . AKI (acute kidney injury) (HCC)   . Urinary tract infection without hematuria   . Sepsis (HCC) 05/02/2017  . Septic shock (HCC) 05/02/2017  . Lumbar spinal stenosis 04/23/2017  . DOE (dyspnea on exertion) 03/08/2017  . AV block, 2nd degree 03/08/2017  . Essential hypertension 03/08/2017  . Family history of coronary artery disease in brother 03/08/2017  . History of asthma 03/08/2017  . AVB (atrioventricular block) 03/08/2017  . Abdominal pain 01/04/2017  . Spinal stenosis 01/04/2017    Orientation RESPIRATION BLADDER Height & Weight     Self, Time, Situation, Place  Normal Continent, Indwelling catheter Weight: 82.1 kg (181 lb) Height:   (162.6 cm)  BEHAVIORAL SYMPTOMS/MOOD NEUROLOGICAL BOWEL NUTRITION STATUS      Continent Diet (Please see DC Summary)  AMBULATORY STATUS COMMUNICATION OF NEEDS Skin   Extensive Assist Verbally Surgical wounds (Closed incision on back)                       Personal Care Assistance Level of Assistance  Bathing,  Dressing Bathing Assistance: Limited assistance   Dressing Assistance: Limited assistance     Functional Limitations Info  Sight, Hearing, Speech Sight Info: Adequate Hearing Info: Adequate Speech Info: Adequate    SPECIAL CARE FACTORS FREQUENCY  PT (By licensed PT)     PT Frequency: not assessed              Contractures Contractures Info: Not present    Additional Factors Info  Code Status, Allergies Code Status Info: Full Allergies Info: Celebrex Celecoxib, Codeine, Cymbalta Duloxetine Hcl, Doxycycline, Meloxicam, Nortriptyline, Percocet Oxycodone-acetaminophen, Penicillins           Current Medications (05/18/2017):  This is the current hospital active medication list Current Facility-Administered Medications  Medication Dose Route Frequency Provider Last Rate Last Dose  . antiseptic oral rinse (BIOTENE) solution 15 mL  15 mL Mouth Rinse TID AC Shirley, [place], DO   15 mL at 05/18/17 0941  . dextrose 5 %-0.45 % sodium chloride infusion   Intravenous Continuous Marquette Saa, MD 75 mL/hr at 05/18/17 [phone]    . diltiazem (CARDIZEM CD) 24 hr capsule 120 mg  120 mg Oral Daily Talbert Forest, [place], DO   120 mg at 05/18/17 0941  . enoxaparin (LOVENOX) injection 40 mg  40 mg Subcutaneous Q24H Marquette Saa, MD   40 mg at 05/18/17 0941  . gabapentin (NEURONTIN) capsule 300 mg  300 mg Oral QHS Shirley, [place], DO   300 mg  at 05/17/17 2115  . Gerhardt's butt cream 1 application  1 application Topical QID PRN Shirley, [place], DO      . haloperidol lactate (HALDOL) injection 0.5 mg  0.5 mg Intravenous Once Marquette Saa, MD      . morphine 4 MG/ML injection 1 mg  1 mg Intravenous Once Marquette Saa, MD      . nystatin (MYCOSTATIN) 100000 UNIT/ML suspension 500,000 Units  5 mL Oral TID Shirley, [place], DO   500,000 Units at 05/18/17 0941  . ondansetron (ZOFRAN) injection 4 mg  4 mg Intravenous Q6H PRN Marquette Saa, MD   4 mg at  05/16/17 1404  . polyethylene glycol (MIRALAX / GLYCOLAX) packet 17 g  17 g Oral BID Carman Ching, MD   17 g at 05/18/17 0941  . polyvinyl alcohol (LIQUIFILM TEARS) 1.4 % ophthalmic solution 1 drop  1 drop Both Eyes QID PRN Carney Living, MD      . traZODone (DESYREL) tablet 50 mg  50 mg Oral QHS PRN Shirley, [place], DO   50 mg at 05/17/17 2115     Discharge Medications: Please see discharge summary for a list of discharge medications.  Relevant Imaging Results:  Relevant Lab Results:   Additional Information SS#: [id number]  Mearl Latin, LCSWA

## 2017-05-18 NOTE — Progress Notes (Signed)
Patient discharge teaching given, including activity, diet, follow-up appoints, and medications. Patient verbalized understanding of all discharge instructions. IV access was d/c'd. Vitals are stable. Skin is intact except as charted in most recent assessments. Pt to be escorted out by [facility] and transferred to [facility].

## 2017-05-24 ENCOUNTER — Ambulatory Visit (INDEPENDENT_AMBULATORY_CARE_PROVIDER_SITE_OTHER): Payer: Medicare Other | Admitting: Physician Assistant

## 2017-05-24 ENCOUNTER — Encounter: Payer: Self-pay | Admitting: Physician Assistant

## 2017-05-24 VITALS — BP 120/54 | HR 94 | Ht 64.0 in | Wt 177.0 lb

## 2017-05-24 DIAGNOSIS — Z950 Presence of cardiac pacemaker: Secondary | ICD-10-CM | POA: Diagnosis not present

## 2017-05-24 DIAGNOSIS — I480 Paroxysmal atrial fibrillation: Secondary | ICD-10-CM | POA: Diagnosis not present

## 2017-05-24 DIAGNOSIS — I2510 Atherosclerotic heart disease of native coronary artery without angina pectoris: Secondary | ICD-10-CM | POA: Diagnosis not present

## 2017-05-24 DIAGNOSIS — R9431 Abnormal electrocardiogram [ECG] [EKG]: Secondary | ICD-10-CM

## 2017-05-24 DIAGNOSIS — I208 Other forms of angina pectoris: Secondary | ICD-10-CM

## 2017-05-24 DIAGNOSIS — I10 Essential (primary) hypertension: Secondary | ICD-10-CM

## 2017-05-24 DIAGNOSIS — I5033 Acute on chronic diastolic (congestive) heart failure: Secondary | ICD-10-CM | POA: Diagnosis not present

## 2017-05-24 MED ORDER — POTASSIUM CHLORIDE CRYS ER 20 MEQ PO TBCR: 20.0000 meq | EXTENDED_RELEASE_TABLET | Freq: Every day | ORAL | 3 refills | Status: AC

## 2017-05-24 NOTE — Patient Instructions (Signed)
Medication Instructions:  1. START LASIX 20 MG TABLET WITH THE DIRECTIONS : TAKE 40 MG LASIX FOR 2 DAYS THEN GO TO LASIX 20 MG DAILY FOR 1 WEEK  2. START POTASSIUM 20 MEQ DAILY FOR 1 WEEK  Labwork: 1. TODAY BMET, PRO BNP  2. IN 1 WEEK BMET TO BE DONE AT NURSING FACILITY ; PLEASE HAVE RESULTS FAXED TO [place] [phone]  Testing/Procedures: NONE ORDERED   Follow-Up: KEEP YOUR UPCOMING APPT WITH DR. ALLRED 06/11/17  Any Other Special Instructions Will Be Listed Below (If Applicable).  CALL OUR OFFICE IF YOU ARE STILL SWOLLEN AFTER THE 1 WEEK    If you need a refill on your cardiac medications before your next appointment, please call your pharmacy.

## 2017-05-24 NOTE — Progress Notes (Signed)
Cardiology Office Note:    Date:  05/24/2017   ID:  Sharon Gilmore, DOB [birth year], MRN [id number]  PCP:  Olivia Canter, MD  Cardiologist:  Dr. Tobias Alexander   Electrophysiologist:  Dr. Hillis Range      Referring MD: Olivia Canter, MD   Chief Complaint  Patient presents with  . Leg Swelling    History of Present Illness:    Sharon Gilmore is a 83 y.o. female with a hx of 2:1 AV block s/p dual chamber PPM in 02/2017, hypertension, spinal stenosis.  She was readmitted in 02/2017 with a non-ST elevation myocardial infarction in the setting of hypertensive urgency.  She underwent Cardiac Catheterization which demonstrated mid LAD 60% stenosis which was not felt to be hemodynamically significant and, otherwise, normal coronary arteries.  It was felt that her elevated Troponins were related to demand ischemia.  She was last seen in the office 04/07/17 by Francis Dowse, PA-C.  She was then admitted 9/7-9/11 for lumbar decompression and fusion.  Perioperative course was uneventful.  However she was readmitted 9/16-9/24 with urosepsis, rectosigmoid colitis and shock that was c/b atrial fibrillation with RVR.  She converted to normal sinus rhythm with IV Diltiazem.  Her pacemaker was interrogated and it was noted that she did not have any other episodes of atrial fibrillation prior to admission.  Therefore, it was felt that atrial fibrillation was related to catecholamine surge and she did not require anticoagulation.  She was admitted again 9/29-10/2 ischemic bowel.  She underwent a flex sigmoidoscopy and hydrated with IVFs.    Sharon Gilmore returns for evaluation of lower extremity edema. She was at her neurosurgeon's office today, next door, and noted to have significant lower extremity edema. She was sent right over for evaluation. She is here today with her son, representative from [place] and granddaughter.  She has not experienced chest pain, shortness of breath, orthopnea, syncope. She  did awaken suddenly once with shortness of breath. This has not recurred. She has noted a cough. She denies any bleeding issues. She's had some diarrhea.  Prior CV studies:   The following studies were reviewed today:  Cardiac Catheterization 03/15/17 LAD mid 60  Echocardiogram 03/13/17 Mild LVH, EF 55-60, mild LAE, trivial effusion  Echo 06/11/16 Moderate focal basal hypertrophy, mild concentric LVH, EF 60-65, grade 1 diastolic dysfunction  Nuclear stress test 06/09/16 EF 68, no ischemia or infarction, normal study  Past Medical History:  Diagnosis Date  . Abnormal barium swallow 09/1999  . Arthritis   . Asthma   . Carpal tunnel syndrome   . Depression   . Erythema nodosum   . H/O Doppler ultrasound    LEFT LEG 11/1997  . Hypertension   . Impaired fasting glucose   . Presence of permanent cardiac pacemaker    MEDTRONIC  . Psoriasis   . Rosacea   . Seizures (HCC) 1980  . Swallowing disorder   . Systolic murmur     Past Surgical History:  Procedure Laterality Date  . BILATERAL CARPAL TUNNEL RELEASE    . CHOLECYSTECTOMY    . COLONOSCOPY  2004  . FLEXIBLE SIGMOIDOSCOPY N/A 05/17/2017   Procedure: FLEXIBLE SIGMOIDOSCOPY;  Surgeon: Carman Ching, MD;  Location: [facility] ENDOSCOPY;  Service: Endoscopy;  Laterality: N/A;  . INCISIONAL HERNIA REPAIR    . LEFT HEART CATH AND CORONARY ANGIOGRAPHY N/A 03/15/2017   Procedure: Left Heart Cath and Coronary Angiography;  Surgeon: Runell Gess, MD;  Location[facility] INVASIVE CV LAB;  Service:  Cardiovascular;  Laterality: N/A;  . PACEMAKER IMPLANT N/A 03/09/2017   Procedure: Pacemaker Implant;  Surgeon: Hillis Range, MD;  Location: [facility] INVASIVE CV LAB;  Service: Cardiovascular;  Laterality: N/A;  . TONSILLECTOMY      Current Medications: Current Meds  Medication Sig  . acetaminophen (TYLENOL) 500 MG tablet Take 1,000 mg by mouth as needed for moderate pain.   Marland Kitchen antiseptic oral rinse (BIOTENE) LIQD 15 mLs by Mouth Rinse route 3 (three)  times daily before meals. spray  . aspirin EC 81 MG tablet Take 81 mg by mouth at bedtime.   . Cholecalciferol (VITAMIN D3) 2000 units TABS Take 2,000 Units by mouth daily.   . cyclobenzaprine (FLEXERIL) 5 MG tablet Take 5 mg by mouth 3 (three) times daily as needed for muscle spasms.  Marland Kitchen diltiazem (CARDIZEM CD) 180 MG 24 hr capsule Take 1 capsule (180 mg total) by mouth daily.  Marland Kitchen gabapentin (NEURONTIN) 600 MG tablet Take 0.5 tablets (300 mg total) by mouth at bedtime.  Marland Kitchen Hydrocortisone (GERHARDT'S BUTT CREAM) CREA Apply 1 application topically 4 (four) times daily as needed for irritation.  . hydroxypropyl methylcellulose / hypromellose (ISOPTO TEARS / GONIOVISC) 2.5 % ophthalmic solution Place 1 drop into both eyes 4 (four) times daily as needed for dry eyes.  . hydroxypropyl methylcellulose / hypromellose (ISOPTO TEARS / GONIOVISC) 2.5 % ophthalmic solution Place 1 drop into both eyes 4 (four) times daily as needed for dry eyes.  Marland Kitchen ipratropium-albuterol (DUONEB) 0.5-2.5 (3) MG/3ML SOLN Take 3 mLs by nebulization See admin instructions. For five days started 05/12/2017  . nystatin (MYCOSTATIN) 100000 UNIT/ML suspension Take 5 mLs by mouth 3 (three) times daily. For seven days starting 05/15/2017  . polyethylene glycol (MIRALAX / GLYCOLAX) packet Take 17 g by mouth 2 (two) times daily.  Marland Kitchen saccharomyces boulardii (FLORASTOR) 250 MG capsule Take 1 capsule (250 mg total) by mouth 2 (two) times daily.  . tamsulosin (FLOMAX) 0.4 MG CAPS capsule Take 1 capsule (0.4 mg total) by mouth daily after breakfast.  . traZODone (DESYREL) 50 MG tablet Take 1 tablet (50 mg total) by mouth at bedtime as needed for sleep.     Allergies:   Celebrex [celecoxib]; Codeine; Cymbalta [duloxetine hcl]; Doxycycline; Meloxicam; Nortriptyline; Percocet [oxycodone-acetaminophen]; and Penicillins   Social History   Social History  . Marital status: Widowed    Spouse name: N/A  . Number of children: 7  . Years of  education: College   Occupational History  . RETIRED RN    Social History Main Topics  . Smoking status: Former Smoker    Quit date: 1982  . Smokeless tobacco: Never Used  . Alcohol use Yes     Comment: 1-2 times per week  . Drug use: No  . Sexual activity: Not Asked   Other Topics Concern  . None   Social History Narrative   Lives at home alone.   Right-handed.   Two cups caffeine per day.     Family Hx: The patient's family history includes Breast cancer in her paternal grandmother; CVA in her maternal grandmother, mother, and paternal grandfather; Coronary artery disease in her brother; Diabetes in her paternal grandmother; Leukemia in her brother; Lung cancer in her paternal uncle; Pancreatic cancer in her brother and paternal aunt; Parkinson's disease in her father; Peripheral vascular disease in her maternal grandfather; Stroke in her maternal grandmother, mother, paternal aunt, and paternal grandfather.  ROS:   Please see the history of present illness.  Review of Systems  Constitution: Positive for decreased appetite.  Cardiovascular: Positive for leg swelling.  Respiratory: Positive for cough.   Gastrointestinal: Positive for abdominal pain and diarrhea.  Neurological: Positive for loss of balance.   All other systems reviewed and are negative.   EKGs/Labs/Other Test Reviewed:    EKG:  EKG is  ordered today.  The ekg ordered today demonstrates Normal sinus rhythm, heart rate 94, normal axis, T-wave inversions 2, 3, aVF, V2-V6, QTc 467 ms  Recent Labs: 03/13/2017: TSH 2.010 05/06/2017: Magnesium 2.0 05/16/2017: ALT 18; B Natriuretic Peptide 185.2 05/17/2017: BUN <5; Creatinine, Ser 0.35; Hemoglobin 10.1; Platelets 317; Potassium 3.5; Sodium 135   Recent Lipid Panel Lab Results  Component Value Date/Time   CHOL 111 03/09/2017 05:02 AM   TRIG 69 03/09/2017 05:02 AM   HDL 47 03/09/2017 05:02 AM   CHOLHDL 2.4 03/09/2017 05:02 AM   LDLCALC 50 03/09/2017 05:02 AM     Physical Exam:    VS:  BP (!) 120/54   Pulse 94   Ht  (1.626 m)   Wt 177 lb (80.3 kg)   SpO2 98%   BMI 30.38 kg/m     Wt Readings from Last 3 Encounters:  05/24/17 177 lb (80.3 kg)  05/15/17 181 lb (82.1 kg)  05/10/17 181 lb (82.1 kg)     Physical Exam  Constitutional: She is oriented to person, place, and time. She appears well-developed and well-nourished. No distress.  HENT:  Head: Normocephalic and atraumatic.  Neck: JVD (JVP ~ 6 cm at 90 degrees) present.  Cardiovascular: Normal rate and regular rhythm.   No murmur heard. Pulmonary/Chest: She has decreased breath sounds in the right lower field and the left lower field. She has no rales.  Abdominal: Soft.  Musculoskeletal: She exhibits edema (2+ lower ext edema bilat).  Neurological: She is alert and oriented to person, place, and time.  Skin: Skin is warm and dry.  Psychiatric: She has a normal mood and affect.    ASSESSMENT:    1. Acute on chronic diastolic HF (heart failure) (HCC)   2. PAF (paroxysmal atrial fibrillation) (HCC)   3. Essential hypertension   4. Cardiac pacemaker in situ   5. Coronary artery disease involving native coronary artery of native heart without angina pectoris   6. Abnormal EKG    PLAN:    In order of problems listed above:  1. Acute on chronic diastolic HF (heart failure) (HCC) She has been admitted several times to the hospital in the last several weeks. Most recently, she had urosepsis, shock and ischemic bowel. She was resuscitated with IV fluids. She has a prior echocardiogram with mild diastolic dysfunction. Her volume excess is likely exacerbated by recent volume expansion in the hospital. She does have hypoalbuminemia noted on recent lab work. This is likely contributing to some of her lower extremity edema as well. Dr. Johney Frame also saw the patient today. She will need diuresis, hopefully only for a short period of time.  -  Start Lasix 40 mg daily 2 days, then  decrease to 20 mg daily  -  Start potassium 20 mEq daily  -  DC Lasix and potassium after 1 week  -  BMET, BNP today  -  BMET 1 week  -  Keep follow-up with Dr. Johney Frame later this month  2. PAF (paroxysmal atrial fibrillation) (HCC) She was noted to be in atrial fibrillation with RVR during recent admission with possible urosepsis. She did not have any  further atrial fibrillation and there was no atrial fibrillation noted on interrogation of her pacemaker. This was all felt to be related to catecholamine surge from her acute illness. Therefore, she was not placed on anticoagulation. She is currently maintaining sinus rhythm.  3. Essential hypertension Blood pressure is controlled.  4. Cardiac pacemaker in situ Follow up with EP as planned. Of note, blood cultures during her hospital stay were negative.  5. Coronary artery disease involving native coronary artery of native heart without angina pectoris Moderate nonobstructive CAD by cardiac catheterization in July. She is not having angina. Continue aspirin.  6. Abnormal EKG She does have diffuse T-wave inversions on ECG today. She's not having angina. I reviewed this with Dr. Johney Frame. At this point, as she did have a recent cardiac catheterization with moderate nonobstructive disease, we do not feel that she needs further workup. She has follow-up already planned later this month.   Dispo:  Return in 3 weeks (on 06/11/2017) for Scheduled Follow Up w/ Dr. Johney Frame.   Medication Adjustments/Labs and Tests Ordered: Current medicines are reviewed at length with the patient today.  Concerns regarding medicines are outlined above.  Tests Ordered: Orders Placed This Encounter  Procedures  . Basic Metabolic Panel (BMET)  . Pro b natriuretic peptide  . EKG 12-Lead   Medication Changes: Meds ordered this encounter  Medications  . potassium chloride SA (K-DUR,KLOR-CON) 20 MEQ tablet    Sig: Take 1 tablet (20 mEq total) by mouth daily.     Dispense:  90 tablet    Refill:  3    Signed, Tereso Newcomer, PA-C  05/24/2017 4:54 PM    [facility] [address] [place], [place], [place]  [address] Phone: [phone]; Fax: [phone]

## 2017-05-25 ENCOUNTER — Telehealth: Payer: Self-pay | Admitting: Internal Medicine

## 2017-05-25 LAB — BASIC METABOLIC PANEL
BUN/Creatinine Ratio: 18 (ref 12–28)
BUN: 7 mg/dL — AB (ref 8–27)
CALCIUM: 8.2 mg/dL — AB (ref 8.7–10.3)
CHLORIDE: 100 mmol/L (ref 96–106)
CO2: 24 mmol/L (ref 20–29)
CREATININE: 0.39 mg/dL — AB (ref 0.57–1.00)
GFR calc Af Amer: 115 mL/min/{1.73_m2} (ref >59)
GFR calc non Af Amer: 100 mL/min/{1.73_m2} (ref >59)
GLUCOSE: 99 mg/dL (ref 65–99)
Potassium: 4.3 mmol/L (ref 3.5–5.2)
Sodium: 138 mmol/L (ref 134–144)

## 2017-05-25 LAB — PRO B NATRIURETIC PEPTIDE: NT-PRO BNP: 397 pg/mL (ref 0–738)

## 2017-05-25 NOTE — Telephone Encounter (Signed)
Calling to get test results for Mrs.  Castiglia . Thanks

## 2017-05-25 NOTE — Telephone Encounter (Signed)
Spoke with pt's son, Rosanne Ashing, about pt's lab results done 05/24/17.

## 2017-05-26 ENCOUNTER — Telehealth: Payer: Self-pay | Admitting: Internal Medicine

## 2017-05-26 NOTE — Telephone Encounter (Signed)
Mrs. Sharon Gilmore is returning a call about her lb results . Thanks

## 2017-05-26 NOTE — Telephone Encounter (Signed)
Notes recorded by Rosalio Macadamia, NP on 05/25/2017 at 9:08 AM EDT Reviewed for Tereso Newcomer, PA.  Labs are stable - would continue on current regimen as outlined at her visit with him yesterday.  Should have repeat BMET planned for one week.   Attempted to call the pt back and no answer, and phone kept ringing with no VM set-up available.

## 2017-05-27 NOTE — Telephone Encounter (Signed)
Attempted to call patient again but memory is full. Labs previously reviewed with patient's son 10/9 (see lab results).

## 2017-05-28 NOTE — Telephone Encounter (Signed)
Notes recorded by Jacqlyn Krauss, RN on 05/25/2017 at 4:41 PM EDT Discussed lab results with pt's son, Rosanne Ashing, he confirmed pt has order to have lab repeated at [facility] and faxed to [place].

## 2017-05-28 NOTE — Telephone Encounter (Signed)
Attempted to call patient, phone rings no answer and no voice mail

## 2017-06-01 ENCOUNTER — Telehealth: Payer: Self-pay | Admitting: Internal Medicine

## 2017-06-01 ENCOUNTER — Telehealth: Payer: Self-pay | Admitting: *Deleted

## 2017-06-01 NOTE — Telephone Encounter (Signed)
No need to continue lasix and KCl

## 2017-06-01 NOTE — Telephone Encounter (Signed)
-----   Message from Beatrice Lecher, [place] sent at 06/01/2017  5:15 PM EDT ----- Please call the patient Renal function normal.  Continue current medications and follow-up as planned. Labs 05/31/17 from rehab facility: Creatinine 0.43, K 4.3 Tereso Newcomer, PA-C    06/01/2017 5:14 PM

## 2017-06-01 NOTE — Telephone Encounter (Signed)
Lmtcb to go over lab results 

## 2017-06-01 NOTE — Telephone Encounter (Signed)
Nurse informed

## 2017-06-01 NOTE — Telephone Encounter (Signed)
New message     Pt c/o swelling: STAT is pt has developed SOB within 24 hours  1) How much weight have you gained and in what time span? no  2) If swelling, where is the swelling located? Lower legs feet  3) Are you currently taking a fluid pill?  No - was done with it as of this morning   4) Are you currently SOB? No   5) Do you have a log of your daily weights (if so, list)? Yes   6) Have you gained 3 pounds in a day or 5 pounds in a week?  167lb  It has went down in the last week , but legs are still swollen , can they use jobe stockings or get new rx of fluid pill  7) Have you traveled recently? no

## 2017-06-01 NOTE — Telephone Encounter (Signed)
Patient requested that facility call office to see if she needed to continue lasix since she had one more day left. Facility weight on 05/24/17-177.4 lbs; 05/27/17-171.2 lbs; 05/29/17-168.2 lbs and 06/01/17-167 lbs.   No c/o dizziness, chest pain or sob. BLE continues but much improved per Orpha Bur at facility and patient has been compliant in using the ankle pumps and elevating legs along with the diuretic.   Recent lab work that was done on 05/31/17 has been faxed to our office and is awaiting to be scanned into chart. Katie advised to continue with same medication plan, complete lasix regimen as planned and that this message would be sent to her provider for advise on lasix.

## 2017-06-02 ENCOUNTER — Telehealth: Payer: Self-pay | Admitting: *Deleted

## 2017-06-02 NOTE — Telephone Encounter (Signed)
-----   Message from Scott T Weaver, PA-C sent at 06/01/2017  5:15 PM EDT ----- Please call the patient Renal function normal.  Continue current medications and follow-up as planned. Labs 05/31/17 from rehab facility: Creatinine 0.43, K 4.3 Scott Weaver, PA-C    06/01/2017 5:14 PM 

## 2017-06-02 NOTE — Telephone Encounter (Signed)
-----   Message from Beatrice LecherScott T Weaver, [place] sent at 06/01/2017  5:15 PM EDT ----- Please call the patient Renal function normal.  Continue current medications and follow-up as planned. Labs 05/31/17 from rehab facility: Creatinine 0.43, K 4.3 Tereso NewcomerScott Weaver, PA-C    06/01/2017 5:14 PM

## 2017-06-02 NOTE — Telephone Encounter (Signed)
Lmtcb x 2 to go over lab results 

## 2017-06-02 NOTE — Telephone Encounter (Signed)
Follow up      Pt said to leave complete message about lab results on vm

## 2017-06-02 NOTE — Telephone Encounter (Signed)
Pt has been notified of lab results by phone with verbal understanding. Pt said ok to leave detailed information (ie results etc) on her cell phone VM. I explained we had to lmom on her home # VM but not cell. Pt asked for me to please add it's ok to lmom with  her results on her cell # as well.

## 2017-06-10 ENCOUNTER — Encounter: Payer: Self-pay | Admitting: Infectious Diseases

## 2017-06-10 ENCOUNTER — Ambulatory Visit (INDEPENDENT_AMBULATORY_CARE_PROVIDER_SITE_OTHER): Payer: Medicare Other | Admitting: Infectious Diseases

## 2017-06-10 VITALS — BP 107/68 | HR 85 | Temp 98.3°F

## 2017-06-10 DIAGNOSIS — B259 Cytomegaloviral disease, unspecified: Secondary | ICD-10-CM | POA: Diagnosis not present

## 2017-06-10 DIAGNOSIS — Z5181 Encounter for therapeutic drug level monitoring: Secondary | ICD-10-CM | POA: Diagnosis not present

## 2017-06-10 DIAGNOSIS — A0839 Other viral enteritis: Secondary | ICD-10-CM

## 2017-06-10 LAB — CBC WITH DIFFERENTIAL/PLATELET
BASOS ABS: 32 {cells}/uL (ref 0–200)
Basophils Relative: 0.4 %
EOS ABS: 160 {cells}/uL (ref 15–500)
Eosinophils Relative: 2 %
HEMATOCRIT: 28.4 % — AB (ref 35.0–45.0)
Hemoglobin: 9 g/dL — ABNORMAL LOW (ref 11.7–15.5)
Lymphs Abs: 2176 cells/uL (ref 850–3900)
MCH: 27.7 pg (ref 27.0–33.0)
MCHC: 31.7 g/dL — AB (ref 32.0–36.0)
MCV: 87.4 fL (ref 80.0–100.0)
MPV: 8.6 fL (ref 7.5–12.5)
Monocytes Relative: 7.9 %
NEUTROS PCT: 62.5 %
Neutro Abs: 5000 cells/uL (ref 1500–7800)
PLATELETS: 433 10*3/uL — AB (ref 140–400)
RBC: 3.25 10*6/uL — ABNORMAL LOW (ref 3.80–5.10)
RDW: 16.9 % — AB (ref 11.0–15.0)
TOTAL LYMPHOCYTE: 27.2 %
WBC: 8 10*3/uL (ref 3.8–10.8)
WBCMIX: 632 {cells}/uL (ref 200–950)

## 2017-06-10 LAB — COMPREHENSIVE METABOLIC PANEL
AG Ratio: 0.8 (calc) — ABNORMAL LOW (ref 1.0–2.5)
ALBUMIN MSPROF: 2.4 g/dL — AB (ref 3.6–5.1)
ALKALINE PHOSPHATASE (APISO): 88 U/L (ref 33–130)
ALT: 15 U/L (ref 6–29)
AST: 15 U/L (ref 10–35)
BUN / CREAT RATIO: 19 (calc) (ref 6–22)
BUN: 7 mg/dL (ref 7–25)
CHLORIDE: 102 mmol/L (ref 98–110)
CO2: 25 mmol/L (ref 20–32)
Calcium: 8.1 mg/dL — ABNORMAL LOW (ref 8.6–10.4)
Creat: 0.37 mg/dL — ABNORMAL LOW (ref 0.60–0.88)
GLOBULIN: 3.1 g/dL (ref 1.9–3.7)
Glucose, Bld: 100 mg/dL — ABNORMAL HIGH (ref 65–99)
POTASSIUM: 4.4 mmol/L (ref 3.5–5.3)
SODIUM: 136 mmol/L (ref 135–146)
TOTAL PROTEIN: 5.5 g/dL — AB (ref 6.1–8.1)
Total Bilirubin: 0.4 mg/dL (ref 0.2–1.2)

## 2017-06-10 MED ORDER — VALGANCICLOVIR HCL 450 MG PO TABS: 900.0000 mg | ORAL_TABLET | Freq: Two times a day (BID) | ORAL | 3 refills | Status: AC

## 2017-06-10 NOTE — Patient Instructions (Signed)
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   You will take 900 mg (2 tabs) twice a day. Usual treatment course 3 - 6 weeks.   Will have you back in 1 week to recheck your blood counts.   Will forward your CBC/CMET to Dr. Johney FrameAllred when they result.   Lotion up your legs while swollen

## 2017-06-10 NOTE — Progress Notes (Signed)
Patient Name: Sharon Gilmore  MRN: [id number]  DOB: 1933-08-28    Reason for Visit: Biopsy confirmed CMV colitis   Referring Provider: Dr. Matthias Hughs [facility])    INFECTIOUS DISEASE OFFICE CONSULT SUBJECTIVE:  HPI: Sharon Gilmore is a 83 y.o. 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 She has had a difficult time since July 2018 with several surgeries, hospital admissions, rehab stay and severe diarrhea a/w weight loss of ~ 20 lbs since onset.   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 Returned 7/28 for hypertensive urgency and SOB where she underwent LHC 7/30 for NSTEMI --> 60% LAD lesion and other cors clean. LVEF WNL. On 9/7 she was admitted for elective back surgery where Dr. Venetia Maxon performed L2 - L5 decompression/fusion for spinal stenosis. She apparently recovered well from this and d/c'd to [facility] SNF.   On 9/16 she was admitted with abd distention, anorexia and hypotension. Required IVF and phenylephrine with short ICU stay. C/O LLQ pain + diarrhea and found to have rectosig colitis with SBO vs ileus. She was tx with 7d course of cipro/flagyl. GI recommended Miralax if she did not have stool d/t concern for possible ileus in setting of colitis. Of note she also had new onset atrial fibrillation and developed urinary retention during this hospitalization and required prolonged foley catheter. No anticoagulation was recommended and started on diltiazem. Discharged back to [facility] 05/10/17.   Unfortunately she returned to [facility] for admission on 9/29 d/t concern over rectal prolapse as she was having severe diarrhea and long stringy tissue projecting out of rectum; she also had chest pain during this time --> EKG unchanged and troponin neg. CT with & w/o contrast was ordered showing unresolved colitis.  Flex sig 10/1 showed ulcerative granulation tissue c/w ischemic bowel to which Miralax was recommended. Received IVF and d/c'd to SNF 10/2.   Since the last hospitalization she tells me she required the use of Mira lax from 10/1 - 10/8 until she experienced persistent hourly episodes of profuse, watery diarrhea. Rx'd probiotics to which she is currently still taking. Underwent a second sigmoidoscopy on 10/17 for persistent diarrhea and again found extensive ulcerated mucosa with bleeding in the rectosigmoid colon.   On 10/19 she underwent a colonoscopy with Dr. Matthias Hughs where non-bleeding patchy ulcerated mucosa was present amongst large areas of denuded mucosa, serpignous ulcerations with white exudate and cobblestoning. Biopsies were taken --> + CMV on IP stain. She were prescribed Rowasa medicated enema after this procedure to which she has noticed some improvement where her BMs are now on occasion semi-formed. She denies fevers but has had some crampy/tenderness in lower abdomen. Has not had a BM since earlier this morning which is encouraging for her. She is hungry today and would like to know what is best for her to eat. She no longer has foley and had follow up with urology recently and has tolerated catheter removal well.   She does not have a history of organ transplant, HIV or other known immunocompromised status. She does have a history of psoriasis but currently not on any treatment for this. Has had previous colonoscopies which were all reported to have been normal and describes her stomach to have been "iron clad until recently." She is worried about the swelling in her legs and reports  it is starting to improve since adding back her diuretic. Would like me to check CBC with CMET to assess anemia (which is new for her) and albumin level.   Patient Active Problem List   Diagnosis Date Noted  . Medication monitoring encounter 06/11/2017  . Cardiac pacemaker in situ 05/24/2017  . Rectal  abnormality 05/16/2017  . SBO (small bowel obstruction) (HCC)   . PAF (paroxysmal atrial fibrillation) (HCC)   . Second degree AV block, Mobitz type II   . CMV colitis (HCC)   . AKI (acute kidney injury) (HCC)   . Urinary tract infection without hematuria   . Lumbar spinal stenosis 04/23/2017  . DOE (dyspnea on exertion) 03/08/2017  . AV block, 2nd degree 03/08/2017  . Essential hypertension 03/08/2017  . Family history of coronary artery disease in brother 03/08/2017  . History of asthma 03/08/2017  . AVB (atrioventricular block) 03/08/2017  . Abdominal pain 01/04/2017  . Spinal stenosis 01/04/2017    Patient's Medications  New Prescriptions   VALGANCICLOVIR (VALCYTE) 450 MG TABLET    Take 2 tablets (900 mg total) by mouth 2 (two) times daily.  Previous Medications   ACETAMINOPHEN (TYLENOL) 500 MG TABLET    Take 1,000 mg by mouth as needed for moderate pain.    ANTISEPTIC ORAL RINSE (BIOTENE) LIQD    15 mLs by Mouth Rinse route 3 (three) times daily before meals. spray   ASPIRIN EC 81 MG TABLET    Take 81 mg by mouth at bedtime.    CHOLECALCIFEROL (VITAMIN D3) 2000 UNITS TABS    Take 2,000 Units by mouth daily.    CYCLOBENZAPRINE (FLEXERIL) 5 MG TABLET    Take 5 mg by mouth 3 (three) times daily as needed for muscle spasms.   GABAPENTIN (NEURONTIN) 300 MG CAPSULE    Take 300 mg by mouth at bedtime.   HYDROCORTISONE (GERHARDT'S BUTT CREAM) CREA    Apply 1 application topically 4 (four) times daily as needed for irritation.   HYDROXYPROPYL METHYLCELLULOSE / HYPROMELLOSE (ISOPTO TEARS / GONIOVISC) 2.5 % OPHTHALMIC SOLUTION    Place 1 drop into both eyes 4 (four) times daily as needed for dry eyes.   NYSTATIN (MYCOSTATIN) 100000 UNIT/ML SUSPENSION    Take 5 mLs by mouth 3 (three) times daily. For seven days starting 05/15/2017   POLYETHYLENE GLYCOL (MIRALAX / GLYCOLAX) PACKET    Take 17 g by mouth 3 (three) times daily as needed (constipation).   POTASSIUM CHLORIDE SA (K-DUR,KLOR-CON) 20  MEQ TABLET    Take 1 tablet (20 mEq total) by mouth daily.   SACCHAROMYCES BOULARDII (FLORASTOR) 250 MG CAPSULE    Take 1 capsule (250 mg total) by mouth 2 (two) times daily.   TRAMADOL (ULTRAM) 50 MG TABLET    Take by mouth every 6 (six) hours as needed.   TRAZODONE (DESYREL) 50 MG TABLET    Take 1 tablet (50 mg total) by mouth at bedtime as needed for sleep.  Modified Medications   Modified Medication Previous Medication   FUROSEMIDE (LASIX) 20 MG TABLET furosemide (LASIX) 20 MG tablet      Take 2 tabs po daily for 3 days, then decrease to 20 mg daily x5days    Take 2 tabs po daily for 3 days, then decrease to 20 mg daily x5days  Discontinued Medications   DILTIAZEM (CARDIZEM CD) 180 MG 24 HR CAPSULE    Take 1 capsule (180 mg total) by mouth daily.   GABAPENTIN (NEURONTIN) 600 MG TABLET  Take 0.5 tablets (300 mg total) by mouth at bedtime.   HYDROXYPROPYL METHYLCELLULOSE / HYPROMELLOSE (ISOPTO TEARS / GONIOVISC) 2.5 % OPHTHALMIC SOLUTION    Place 1 drop into both eyes 4 (four) times daily as needed for dry eyes.   IPRATROPIUM-ALBUTEROL (DUONEB) 0.5-2.5 (3) MG/3ML SOLN    Take 3 mLs by nebulization See admin instructions. For five days started 05/12/2017   POLYETHYLENE GLYCOL (MIRALAX / GLYCOLAX) PACKET    Take 17 g by mouth 2 (two) times daily.   TAMSULOSIN (FLOMAX) 0.4 MG CAPS CAPSULE    Take 1 capsule (0.4 mg total) by mouth daily after breakfast.    Review of Systems  All other systems reviewed and are negative. 10-point system review completed and negative aside from above.   Past Medical History:  Diagnosis Date  . Abnormal barium swallow 09/1999  . Arthritis   . Asthma   . Carpal tunnel syndrome   . Depression   . Erythema nodosum   . H/O Doppler ultrasound    LEFT LEG 11/1997  . Hypertension   . Impaired fasting glucose   . Presence of permanent cardiac pacemaker    MEDTRONIC  . Psoriasis   . Rosacea   . Seizures (HCC) 1980  . Swallowing disorder   . Systolic murmur       Social History  Substance Use Topics  . Smoking status: Former Smoker    Quit date: 1982  . Smokeless tobacco: Never Used  . Alcohol use Yes     Comment: 1-2 times per week    Family History  Problem Relation Age of Onset  . CVA Mother   . Stroke Mother   . Parkinson's disease Father   . Leukemia Brother   . CVA Maternal Grandmother   . Stroke Maternal Grandmother   . Peripheral vascular disease Maternal Grandfather   . Breast cancer Paternal Grandmother   . Diabetes Paternal Grandmother   . CVA Paternal Grandfather   . Stroke Paternal Grandfather   . Pancreatic cancer Paternal Aunt   . Stroke Paternal Aunt   . Lung cancer Paternal Uncle   . Pancreatic cancer Brother   . Coronary artery disease Brother      Allergies  Allergen Reactions  . Celebrex [Celecoxib] Other (See Comments)    LEG ACHING   . Codeine Nausea And Vomiting  . Cymbalta [Duloxetine Hcl] Other (See Comments)    Sensitive per MAR  . Doxycycline Swelling and Other (See Comments)    Facial swelling  . Meloxicam Other (See Comments)    TUMMY ACHE, INEFFECTIVE  . Nortriptyline Other (See Comments)    unknown  . Percocet [Oxycodone-Acetaminophen] Nausea And Vomiting  . Penicillins Rash and Other (See Comments)    Has patient had a PCN reaction causing immediate rash, facial/tongue/throat swelling, SOB or lightheadedness with hypotension: No Has patient had a PCN reaction causing severe rash involving mucus membranes or skin necrosis: No Has patient had a PCN reaction that required hospitalization No Has patient had a PCN reaction occurring within the last 10 years: No If all of the above answers are "NO", then may proceed with Cephalosporin use.     OBJECTIVE: Vitals:   06/10/17 1125  BP: 107/68  Pulse: 85  Temp: 98.3 F (36.8 C)  TempSrc: Oral   There is no height or weight on file to calculate BMI.  Physical Exam  Constitutional: She is oriented to person, place, and time. No  distress.  Elderly caucasian woman in wheelchair  with her son present for visit. Appears tired, pale.   HENT:  Mouth/Throat: Oropharynx is clear and moist.  Eyes: No scleral icterus.  Cardiovascular: Normal rate, regular rhythm and normal heart sounds.  Exam reveals decreased pulses.   Pulmonary/Chest: Effort normal and breath sounds normal. No respiratory distress.  Abdominal: Soft. Bowel sounds are normal. She exhibits distension. There is tenderness.  Musculoskeletal:  Generalized weakness. 2 - 3+ pitting edema in feet to mid shin.   Neurological: She is alert and oriented to person, place, and time.  Skin: Skin is warm and dry.  Dry flaky skin to LE's with swelling.     ASSESSMENT & PLAN:  Problem List Items Addressed This Visit      Digestive   CMV colitis (HCC) - Primary    Biopsy c/w severe CMV colitis but cannot exclude CDiff colitis; no evidence of chronic IBD. Uncertain as to the results of CDiff testing. Normally CMV does not cause this effect in immunocompetent individuals, however she has had several back to back medical issues including 2 surgeries, NSTEMI and sepsis in a short time frame. With prolonged, severe course will try valganciclovir 900 mg BID x 4 weeks. Check CBC and CMET today. I have called SNF to provide instructions of new medications and to take with food.   Will need to FU on CDiff testing and tx if co-existing infection as she is at risk d/t hospitalizations and ABX exposure.   I advised I will defer specific orders regarding FU need for medicated enemas, dietary restrictions to Dr. Matthias HughsBuccini.         Relevant Medications   valGANciclovir (VALCYTE) 450 MG tablet   Other Relevant Orders   CBC with Differential/Platelet (Completed)   Comprehensive metabolic panel (Completed)     Other   Medication monitoring encounter    RTC with Dr. Luciana Axeomer in 1 week to assess condition and ensure no S/E from medication with CBC in 1 week to assess for neutropenia,  worsening anemia, thrombocytopenia.       Relevant Orders   CBC      Rexene AlbertsStephanie Dixon, MSN, [facility]NP[facility]  06/11/2017  4:13 PM

## 2017-06-11 ENCOUNTER — Ambulatory Visit (INDEPENDENT_AMBULATORY_CARE_PROVIDER_SITE_OTHER): Payer: Medicare Other | Admitting: Internal Medicine

## 2017-06-11 ENCOUNTER — Encounter: Payer: Self-pay | Admitting: Internal Medicine

## 2017-06-11 VITALS — BP 106/64 | HR 79 | Ht 64.0 in | Wt 173.6 lb

## 2017-06-11 DIAGNOSIS — Z5181 Encounter for therapeutic drug level monitoring: Secondary | ICD-10-CM | POA: Insufficient documentation

## 2017-06-11 DIAGNOSIS — I208 Other forms of angina pectoris: Secondary | ICD-10-CM

## 2017-06-11 DIAGNOSIS — I480 Paroxysmal atrial fibrillation: Secondary | ICD-10-CM | POA: Diagnosis not present

## 2017-06-11 DIAGNOSIS — I5033 Acute on chronic diastolic (congestive) heart failure: Secondary | ICD-10-CM | POA: Diagnosis not present

## 2017-06-11 DIAGNOSIS — Z950 Presence of cardiac pacemaker: Secondary | ICD-10-CM | POA: Diagnosis not present

## 2017-06-11 DIAGNOSIS — I10 Essential (primary) hypertension: Secondary | ICD-10-CM | POA: Diagnosis not present

## 2017-06-11 DIAGNOSIS — I441 Atrioventricular block, second degree: Secondary | ICD-10-CM

## 2017-06-11 MED ORDER — FUROSEMIDE 20 MG PO TABS: ORAL_TABLET | ORAL | 0 refills | Status: DC

## 2017-06-11 NOTE — Progress Notes (Signed)
PCP: Olivia CanterKelly, William, MD   Primary EP:  Dr Johney FrameAllred  [name] is a 83 y.o. female who presents today for routine electrophysiology followup.  Since last being seen in our clinic, the patient reports doing reasonably well.  She is making slow progress.  Her SOB is improved.  Her energy and edema are slowly improving.  Today, she denies symptoms of palpitations, chest pain, shortness of breath,  lower extremity edema, dizziness, presyncope, or syncope.  The patient is otherwise without complaint today.   Past Medical History:  Diagnosis Date  . Abnormal barium swallow 09/1999  . Arthritis   . Asthma   . Carpal tunnel syndrome   . Depression   . Erythema nodosum   . H/O Doppler ultrasound    LEFT LEG 11/1997  . Hypertension   . Psoriasis   . Rosacea   . Seizures (HCC) 1980  . Swallowing disorder    Past Surgical History:  Procedure Laterality Date  . BILATERAL CARPAL TUNNEL RELEASE    . CHOLECYSTECTOMY    . COLONOSCOPY  2004  . FLEXIBLE SIGMOIDOSCOPY N/A 05/17/2017   Procedure: FLEXIBLE SIGMOIDOSCOPY;  Surgeon: Carman ChingEdwards, Maurico Perrell, MD;  [facility];  Service: Endoscopy;  Laterality: N/A;  . INCISIONAL HERNIA REPAIR    . LEFT HEART CATH AND CORONARY ANGIOGRAPHY N/A 03/15/2017   Procedure: Left Heart Cath and Coronary Angiography;  Surgeon: Runell GessBerry, Jonathan J, MD;  [facility];  Service: Cardiovascular;  Laterality: N/A;  . PACEMAKER IMPLANT N/A 03/09/2017   Medtronic Azure XT MRI conditional dual-chamber pacemaker for symptomatic mobitz II second degree AV block by Dr Johney FrameAllred  . TONSILLECTOMY      ROS- all systems are reviewed and negative except as per HPI above  Current Outpatient Prescriptions  Medication Sig Dispense Refill  . acetaminophen (TYLENOL) 500 MG tablet Take 1,000 mg by mouth as needed for moderate pain.     Marland Kitchen. antiseptic oral rinse (BIOTENE) LIQD 15 mLs by Mouth Rinse route 3 (three) times daily before meals. spray    . aspirin EC 81  MG tablet Take 81 mg by mouth at bedtime.     . Cholecalciferol (VITAMIN D3) 2000 units TABS Take 2,000 Units by mouth daily.     . cyclobenzaprine (FLEXERIL) 5 MG tablet Take 5 mg by mouth 3 (three) times daily as needed for muscle spasms.    Marland Kitchen. gabapentin (NEURONTIN) 300 MG capsule Take 300 mg by mouth at bedtime.    Marland Kitchen. Hydrocortisone (GERHARDT'S BUTT CREAM) CREA Apply 1 application topically 4 (four) times daily as needed for irritation.    . hydroxypropyl methylcellulose / hypromellose (ISOPTO TEARS / GONIOVISC) 2.5 % ophthalmic solution Place 1 drop into both eyes 4 (four) times daily as needed for dry eyes.    Marland Kitchen. nystatin (MYCOSTATIN) 100000 UNIT/ML suspension Take 5 mLs by mouth 3 (three) times daily. For seven days starting 05/15/2017    . polyethylene glycol (MIRALAX / GLYCOLAX) packet Take 17 g by mouth 3 (three) times daily as needed (constipation).    . potassium chloride SA (K-DUR,KLOR-CON) 20 MEQ tablet Take 1 tablet (20 mEq total) by mouth daily. 90 tablet 3  . saccharomyces boulardii (FLORASTOR) 250 MG capsule Take 1 capsule (250 mg total) by mouth 2 (two) times daily.    . traMADol (ULTRAM) 50 MG tablet Take by mouth every 6 (six) hours as needed.    . traZODone (DESYREL) 50 MG tablet Take 1 tablet (50 mg total) by  mouth at bedtime as needed for sleep.    . furosemide (LASIX) 20 MG tablet Take 2 tabs po daily for 3 days, then decrease to 20 mg daily x5days 11 tablet 0  . valGANciclovir (VALCYTE) 450 MG tablet Take 2 tablets (900 mg total) by mouth 2 (two) times daily. (Patient not taking: Reported on 06/11/2017) 60 tablet 3   No current facility-administered medications for this visit.     Physical Exam: Vitals:   06/11/17 1156  BP: 106/64  Pulse: 79  Weight: 173 lb 9.6 oz (78.7 kg)  Height: 5\' 4"  (1.626 m)    GEN- The patient is elderly and frail appearing, alert and oriented x 3 today.   Head- normocephalic, atraumatic Eyes-  Sclera clear, conjunctiva pink Ears- hearing  intact Oropharynx- clear Lungs- Clear to ausculation bilaterally, normal work of breathing Chest- pacemaker pocket is well healed Heart- Regular rate and rhythm, no murmurs, rubs or gallops, PMI not laterally displaced GI- soft, NT, ND, + BS Extremities- no clubbing, cyanosis, +2 edema  Pacemaker interrogation- reviewed in detail today,  See PACEART report  ekg tracing ordered today is personally reviewed and shows sinus rhythm, normal ekg  Assessment and Plan:  1. Symptomatic second degree AV block Normal pacemaker function See Pace Art report AV delays increased today to promote intrinsic conduction  2. Acute on chronic diastolic dysfunction Exacerbated by comorbidities of anemia, hypoalbumenemia, and inactivity 2 gram sodium restriction Will try short duration diuresis Increased activity is advised Stop diltiazem which may be contributing to swelling  3. Paroxysmal atrial fibrillation No afib by device interrogation Likely due to prior medical illness Would avoid anticoagulation at this time  3. HTN Stable No change required today Stop diltiazem Stop terazosin  4. CAD- moderate nonobstructive VAD by cath in July No ischemic symptoms  Return to see EP PA in 3 months [facility]   Hillis Range MD, [facility] 06/14/2017 1:24 PM

## 2017-06-11 NOTE — Assessment & Plan Note (Signed)
RTC with Dr. Luciana Axeomer in 1 week to assess condition and ensure no S/E from medication with CBC in 1 week to assess for neutropenia, worsening anemia, thrombocytopenia.

## 2017-06-11 NOTE — Patient Instructions (Addendum)
Medication Instructions:   Your physician has recommended you make the following change in your medication:   1.)  Stop Flomax  2.) Start Furosemide (Lasix) 40 mg daily for 3 days AND then 20 mg daily for 5 days.  3.) Stop Diltiazem  -- If you need a refill on your cardiac medications before your next appointment, please call your pharmacy. --  Labwork: None ordered  Testing/Procedures: None ordered  Follow-Up: Your physician wants you to follow-up in: 3 months with Francis Dowseenee Ursuy PA. You will receive a reminder letter in the mail two months in advance. If you don't receive a letter, please call our office to schedule the follow-up appointment.  Remote monitoring is used to monitor your Pacemaker  from home. This monitoring reduces the number of office visits required to check your device to one time per year. It allows us to keep an eye on the functioning of your device to ensure it is working properly.   You are scheduled for a device check from home on 09/13/2017. You may send your transmission at any time that day. If you have a wireless device, the transmission will be sent automatically. After your physician reviews your transmission, you will receive a postcard with your next transmission date.    Thank you for choosing [facility]!!   Sigurd SosMichael Sebasthian Stailey, RN [phone]  Any Other Special Instructions Will Be Listed Below (If Applicable).

## 2017-06-11 NOTE — Assessment & Plan Note (Signed)
Biopsy c/w severe CMV colitis but cannot exclude CDiff colitis; no evidence of chronic IBD. Uncertain as to the results of CDiff testing. Normally CMV does not cause this effect in immunocompetent individuals, however she has had several back to back medical issues including 2 surgeries, NSTEMI and sepsis in a short time frame. With prolonged, severe course will try valganciclovir 900 mg BID x 4 weeks. Check CBC and CMET today. I have called SNF to provide instructions of new medications and to take with food.   Will need to FU on CDiff testing and tx if co-existing infection as she is at risk d/t hospitalizations and ABX exposure.   I advised I will defer specific orders regarding FU need for medicated enemas, dietary restrictions to Dr. Matthias HughsBuccini.

## 2017-06-14 ENCOUNTER — Encounter: Payer: Self-pay | Admitting: Internal Medicine

## 2017-06-15 ENCOUNTER — Ambulatory Visit (INDEPENDENT_AMBULATORY_CARE_PROVIDER_SITE_OTHER): Payer: Medicare Other | Admitting: Internal Medicine

## 2017-06-15 ENCOUNTER — Telehealth: Payer: Self-pay

## 2017-06-15 ENCOUNTER — Encounter: Payer: Self-pay | Admitting: Internal Medicine

## 2017-06-15 DIAGNOSIS — Z5181 Encounter for therapeutic drug level monitoring: Secondary | ICD-10-CM

## 2017-06-15 DIAGNOSIS — A0839 Other viral enteritis: Secondary | ICD-10-CM | POA: Diagnosis present

## 2017-06-15 DIAGNOSIS — J069 Acute upper respiratory infection, unspecified: Secondary | ICD-10-CM | POA: Diagnosis not present

## 2017-06-15 DIAGNOSIS — I208 Other forms of angina pectoris: Secondary | ICD-10-CM | POA: Diagnosis not present

## 2017-06-15 DIAGNOSIS — B259 Cytomegaloviral disease, unspecified: Secondary | ICD-10-CM | POA: Diagnosis not present

## 2017-06-15 NOTE — Progress Notes (Signed)
   Subjective:    Patient ID: Sharon Gilmore, female    DOB: 1933/09/15, 83 y.o.   MRN: [id number]  HPI Here for follow up of CMV colitis.  Has had multiple medical issues and over the last 2 months with loose watery stool every 1-2 hours, significant weight loss and flex sig don by Dr. Matthias HughsBuccini significant for CMV on pathology.  No immunosuppression.  We had her start induction therapy with oral valganciclovir 900 mg twice a day to see if there is any benefit.  Since starting, she is coughing with productive sputum, nausea since yesterday.  She does report a sick contact.  Temperature 100.8 this am.   Otherwise her diarrhea is improved some with less frequency and more formed.  She is here again with her son.     Review of Systems  Constitutional: Positive for fatigue and fever. Negative for chills.  Gastrointestinal: Positive for diarrhea and nausea. Negative for abdominal distention.  Skin: Negative for rash.       Objective:   Physical Exam  Constitutional: No distress.  In some mild distress with nausea  Eyes: No scleral icterus.  Cardiovascular: Normal rate, regular rhythm and normal heart sounds.   No murmur heard. Pulmonary/Chest: Effort normal and breath sounds normal. No respiratory distress. She has no wheezes.  Skin: No rash noted.   SH: is in a rehab facility       Assessment & Plan:

## 2017-06-15 NOTE — Assessment & Plan Note (Signed)
It is unclear if her nausea is a side effect or viral illness.  She does get more post tussive nausea but could be the valganciclovir.   Continue zofran.  Hopefully can continue to tolerate. rtc 1 week.

## 2017-06-15 NOTE — Telephone Encounter (Signed)
Per Dr. Luciana Axeomer called [facility] and spoke with Sharon LemmingsWhitney and asked for labs to be faxed to this office. Sharon OctaveLanatra Yarielys Gilmore

## 2017-06-15 NOTE — Assessment & Plan Note (Signed)
Cough, congestion, most c/w uri.  Supportive care.  I less suspect a reaction to the medication

## 2017-06-15 NOTE — Assessment & Plan Note (Signed)
Labs done today.  Will get results.  I have requested repeat labs 11/2 with cbc, cmp with results here.

## 2017-06-22 ENCOUNTER — Ambulatory Visit (INDEPENDENT_AMBULATORY_CARE_PROVIDER_SITE_OTHER): Payer: Medicare Other | Admitting: Internal Medicine

## 2017-06-22 VITALS — BP 106/66 | HR 94 | Temp 98.0°F

## 2017-06-22 DIAGNOSIS — I208 Other forms of angina pectoris: Secondary | ICD-10-CM | POA: Diagnosis not present

## 2017-06-22 DIAGNOSIS — D649 Anemia, unspecified: Secondary | ICD-10-CM

## 2017-06-22 DIAGNOSIS — E440 Moderate protein-calorie malnutrition: Secondary | ICD-10-CM | POA: Diagnosis not present

## 2017-06-22 DIAGNOSIS — B259 Cytomegaloviral disease, unspecified: Secondary | ICD-10-CM | POA: Diagnosis not present

## 2017-06-22 DIAGNOSIS — Z5181 Encounter for therapeutic drug level monitoring: Secondary | ICD-10-CM | POA: Diagnosis not present

## 2017-06-22 DIAGNOSIS — A0839 Other viral enteritis: Secondary | ICD-10-CM

## 2017-06-22 NOTE — Progress Notes (Signed)
Patient ID: [name] Declercq, female   DOB: 02-20-1934, 83 y.o.   MRN: [id number]  HPI Ms. 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 Interestingly she is not known to be immunocompromised host. She was started on induction therapy on valcyte 900mg  BID. She states that she has noticed her stool is becoming more formed though occasionally having mucous like stools, denies any blood in her stool. Appetite is still depressed, often eating several small meals per day rather than standard 3 meals per day. She is tolerating taking her antivirals without difficulty  Since beginning of the year, her lab work reveals progressive anemia. On 10/25, hgb 9.0, last week's labs show 8.7. However, her BL appears closer to 11-12 beginning of the year. Unclear if this is part iron deficiency.   She is here with her son helping colaborating her history   Outpatient Encounter Medications as of 06/22/2017  Medication Sig  . antiseptic oral rinse (BIOTENE) LIQD 15 mLs by Mouth Rinse route 3 (three) times daily before meals. spray  . aspirin EC 81 MG tablet Take 81 mg by mouth at bedtime.   . Cholecalciferol (VITAMIN D3) 2000 units TABS Take 2,000 Units by mouth daily.   . cyclobenzaprine (FLEXERIL) 5 MG tablet Take 5 mg by mouth 3 (three) times daily as needed for muscle spasms.  Marland Kitchen. gabapentin (NEURONTIN) 300 MG capsule Take 300 mg by mouth at bedtime.  . hydroxypropyl methylcellulose / hypromellose (ISOPTO TEARS / GONIOVISC) 2.5 % ophthalmic solution Place 1 drop into both eyes 4 (four) times daily as needed for dry eyes.  Marland Kitchen. nystatin (MYCOSTATIN) 100000 UNIT/ML suspension Take 5 mLs by mouth 3 (three) times daily. For seven days starting 05/15/2017  . saccharomyces boulardii (FLORASTOR) 250 MG capsule Take 1 capsule (250 mg total) by mouth 2 (two) times daily.  . traZODone (DESYREL) 50 MG  tablet Take 1 tablet (50 mg total) by mouth at bedtime as needed for sleep.  Marland Kitchen. acetaminophen (TYLENOL) 500 MG tablet Take 1,000 mg by mouth as needed for moderate pain.   . furosemide (LASIX) 20 MG tablet Take 2 tabs po daily for 3 days, then decrease to 20 mg daily x5days (Patient not taking: Reported on 06/22/2017)  . Hydrocortisone (GERHARDT'S BUTT CREAM) CREA Apply 1 application topically 4 (four) times daily as needed for irritation. (Patient not taking: Reported on 06/22/2017)  . polyethylene glycol (MIRALAX / GLYCOLAX) packet Take 17 g by mouth 3 (three) times daily as needed (constipation).  . potassium chloride SA (K-DUR,KLOR-CON) 20 MEQ tablet Take 1 tablet (20 mEq total) by mouth daily. (Patient not taking: Reported on 06/22/2017)  . traMADol (ULTRAM) 50 MG tablet Take by mouth every 6 (six) hours as needed.  . valGANciclovir (VALCYTE) 450 MG tablet Take 2 tablets (900 mg total) by mouth 2 (two) times daily. (Patient not taking: Reported on 06/11/2017)   No facility-administered encounter medications on file as of 06/22/2017.      Patient Active Problem List   Diagnosis Date Noted  . URI (upper respiratory infection) 06/15/2017  . Medication monitoring encounter 06/11/2017  . Cardiac pacemaker in situ 05/24/2017  . Rectal abnormality 05/16/2017  . SBO (small bowel obstruction) (HCC)   . PAF (paroxysmal atrial fibrillation) (HCC)   . Second degree AV block, Mobitz type II   . CMV colitis (HCC)   . AKI (  acute kidney injury) (HCC)   . Urinary tract infection without hematuria   . Lumbar spinal stenosis 04/23/2017  . DOE (dyspnea on exertion) 03/08/2017  . AV block, 2nd degree 03/08/2017  . Essential hypertension 03/08/2017  . Family history of coronary artery disease in brother 03/08/2017  . History of asthma 03/08/2017  . AVB (atrioventricular block) 03/08/2017  . Abdominal pain 01/04/2017  . Spinal stenosis 01/04/2017     Health Maintenance Due  Topic Date Due  .  TETANUS/TDAP  07/17/1955  . DEXA SCAN  07/16/2001  . PNA vac Low Risk Adult (1 of 2 - PCV13) 07/16/2001     Review of Systems  Physical Exam   BP 106/66   Pulse 94   Temp 98 F (36.7 C) (Oral)   Physical Exam  Constitutional:  oriented to person, place, and time. appears well-developed and well-nourished. No distress.  HENT: [place]/AT, PERRLA, no scleral icterus Mouth/Throat: Oropharynx is clear and moist. No oropharyngeal exudate.  Cardiovascular: Normal rate, regular rhythm and normal heart sounds. Exam reveals no gallop and no friction rub.  No murmur heard.  Pulmonary/Chest: Effort normal and breath sounds normal. No respiratory distress.  has no wheezes.  Neck = supple, no nuchal rigidity Abdominal: Soft. Bowel sounds are normal.  exhibits no distension. There is no tenderness.  Lymphadenopathy: no cervical adenopathy. No axillary adenopathy Neurological: alert and oriented to person, place, and time.  Skin: Skin is warm and dry. No rash noted. No erythema.  Psychiatric: a normal mood and affect.  behavior is normal.   CBC Lab Results  Component Value Date   WBC 8.0 06/10/2017   RBC 3.25 (L) 06/10/2017   HGB 9.0 (L) 06/10/2017   HCT 28.4 (L) 06/10/2017   PLT 433 (H) 06/10/2017   MCV 87.4 06/10/2017   MCH 27.7 06/10/2017   MCHC 31.7 (L) 06/10/2017   RDW 16.9 (H) 06/10/2017   LYMPHSABS 2,176 06/10/2017   MONOABS 0.9 05/05/2017   EOSABS 160 06/10/2017    BMET Lab Results  Component Value Date   NA 136 06/10/2017   K 4.4 06/10/2017   CL 102 06/10/2017   CO2 25 06/10/2017   GLUCOSE 100 (H) 06/10/2017   BUN 7 06/10/2017   CREATININE 0.37 (L) 06/10/2017   CALCIUM 8.1 (L) 06/10/2017   GFRNONAA 100 05/24/2017   GFRAA 115 05/24/2017    Labs from 11/2 [facility]: Wbc at 4.8, hgb 8.7, plt 336  Assessment and Plan  cmv coliits = appears some improvement. Wbc is trending down. In the meantime, will decrease to 450mg  bid as on 11/10 until we get results from her  pcp Plan on getting labs this week from [facility]. Do cbc with diff, cmp,- looking for drug side effects  Anemia = could be multifactorial. Will ask pcp to do work up for anemia to include iron studies.   Malnutrition = recommended nutritional supplementation

## 2017-06-24 ENCOUNTER — Telehealth: Payer: Self-pay | Admitting: Internal Medicine

## 2017-06-24 DIAGNOSIS — I480 Paroxysmal atrial fibrillation: Secondary | ICD-10-CM

## 2017-06-24 LAB — CUP PACEART INCLINIC DEVICE CHECK
Brady Statistic AP VP Percent: 0.2 %
Brady Statistic AP VS Percent: 0.04 %
Brady Statistic AS VS Percent: 14.56 %
Brady Statistic RV Percent Paced: 85.4 %
Date Time Interrogation Session: 20181026161109
Implantable Lead Implant Date: 20180724
Implantable Lead Location: 753860
Implantable Lead Model: 5076
Implantable Lead Model: 5076
Lead Channel Impedance Value: 361 Ohm
Lead Channel Impedance Value: 456 Ohm
Lead Channel Pacing Threshold Amplitude: 1 V
Lead Channel Pacing Threshold Pulse Width: 0.4 ms
Lead Channel Sensing Intrinsic Amplitude: 22.25 mV
Lead Channel Setting Pacing Amplitude: 2 V
Lead Channel Setting Pacing Pulse Width: 0.4 ms
Lead Channel Setting Sensing Sensitivity: 2 mV
MDC IDC LEAD IMPLANT DT: 20180724
MDC IDC LEAD LOCATION: 753859
MDC IDC MSMT BATTERY REMAINING LONGEVITY: 134 mo
MDC IDC MSMT BATTERY VOLTAGE: 3.15 V
MDC IDC MSMT LEADCHNL RA IMPEDANCE VALUE: 323 Ohm
MDC IDC MSMT LEADCHNL RA IMPEDANCE VALUE: 361 Ohm
MDC IDC MSMT LEADCHNL RA PACING THRESHOLD AMPLITUDE: 0.75 V
MDC IDC MSMT LEADCHNL RA SENSING INTR AMPL: 2.375 mV
MDC IDC MSMT LEADCHNL RV PACING THRESHOLD PULSEWIDTH: 0.4 ms
MDC IDC PG IMPLANT DT: 20180724
MDC IDC SET LEADCHNL RV PACING AMPLITUDE: 2.5 V
MDC IDC STAT BRADY AS VP PERCENT: 85.2 %
MDC IDC STAT BRADY RA PERCENT PACED: 0.3 %

## 2017-06-24 NOTE — Telephone Encounter (Signed)
Pt calling in, as instructed by her Neurologist, to request for Dr Sharon Gilmore to extend her lasix use, or have this scheduled. Pt states she saw her Surgeon Dr Venetia MaxonStern, and he noticed that since the pt has taken her last dose of lasix, she is having some mild lower extremity edema. Pt states its mild edema.  Pt states she does not weight herself daily.  Pt states she is asymptomatic otherwise, from a cardiac perspective. Pt states she completed her prescription of lasix Dr Sharon Gilmore ordered at her 10/26 OV, and she was really feeling good while on this med.  Pt states she has been off of it for 2 days, and has noticed her swelling coming back in her lower extremities.  Pt would like for Dr Sharon Gilmore to advise on adding lasix back to her daily regimen, even if its low dose.  Pt states her Surgeon Dr Venetia MaxonStern, agreed to her taking it daily as well. Informed the pt that Dr Sharon Gilmore is out of the office today, but I will route this message to him and his RN for further review, recommendation, and follow-up with the pt thereafter.  Advised the pt that in the meantime, she should limit her salt intake.  Pt verbalized understanding and agrees with this plan.

## 2017-06-24 NOTE — Telephone Encounter (Signed)
New Message   [phone] cell if no answer at home  Pt c/o swelling: STAT is pt has developed SOB within 24 hours  1) How much weight have you gained and in what time span? no  2) If swelling, where is the swelling located? Lower legs   3) Are you currently taking a fluid pill? no  4) Are you currently SOB? Some , but she thinks its her asthma   5) Do you have a log of your daily weights (if so, list)? no  6) Have you gained 3 pounds in a day or 5 pounds in a week? Doesn't know  7) Have you traveled recently?  no

## 2017-06-24 NOTE — Telephone Encounter (Signed)
Pt's general cardiologist is Dr. Delton SeeNelson.  Will route to Dr. Lindaann SloughNelson's nurse.

## 2017-06-24 NOTE — Telephone Encounter (Signed)
Start lasix 20mg  daily Repeat bmet in 1 week

## 2017-06-25 ENCOUNTER — Telehealth: Payer: Self-pay | Admitting: *Deleted

## 2017-06-25 MED ORDER — FUROSEMIDE 20 MG PO TABS: 20.0000 mg | ORAL_TABLET | Freq: Every day | ORAL | 3 refills | Status: DC

## 2017-06-25 NOTE — Telephone Encounter (Signed)
Spoke with patient and scheduled her for BMET lab draw and told her to start Lasix at 20 mg daily.  Patient verbalized understanding.

## 2017-06-25 NOTE — Telephone Encounter (Signed)
Patient called stating she has been discharged from the skilled nursing facility and did not get the labs that Dr. Drue SecondSnider requested. She is wanting to have these done at her PCP today. [facility] and [facility] with Dr. Landry DykeKelly's office and verbal order given for a cbc with diff, cmp, and iron studies. Gave fax # to have these results sent to Dr. Drue SecondSnider. Wendall MolaJacqueline Lenix Benoist

## 2017-06-28 ENCOUNTER — Other Ambulatory Visit: Payer: Self-pay | Admitting: Pharmacist

## 2017-06-29 ENCOUNTER — Telehealth: Payer: Self-pay | Admitting: Internal Medicine

## 2017-06-29 NOTE — Telephone Encounter (Deleted)
Spoke with patient regarding lab draw for BMET.  I am trying to figure out if we can use the lab values from [facility] draw on 11/12 to replace another draw, which should be done 11/16 due to Lasix start 11/9.  She had CBC/CMET drawn on 11/12 by PCP, which they will fax to us.  Can we use this lab draw if the values are acceptable?  I have the lab results which we can review tomorrow.  Thanx

## 2017-06-29 NOTE — Telephone Encounter (Signed)
Spoke with patient and will inform her about lab draw decision as soon as decided.  She needs a BMET drawn on Monday either here or at the Infectious Disease visit.

## 2017-06-29 NOTE — Telephone Encounter (Signed)
New Message      Per patient Infectious Disease office needs us to fax the orders over for lab work

## 2017-07-01 NOTE — Telephone Encounter (Signed)
LM to Marcelino DusterMichelle RN from Dr. Ephriam Knucklesomer's office regarding patient's lab draw on Monday.  Waiting for response.

## 2017-07-02 ENCOUNTER — Other Ambulatory Visit: Payer: Medicare Other

## 2017-07-05 ENCOUNTER — Ambulatory Visit (INDEPENDENT_AMBULATORY_CARE_PROVIDER_SITE_OTHER): Payer: Medicare Other | Admitting: Internal Medicine

## 2017-07-05 ENCOUNTER — Encounter: Payer: Self-pay | Admitting: Internal Medicine

## 2017-07-05 VITALS — BP 137/84 | HR 103 | Temp 98.2°F | Ht 64.0 in | Wt 164.0 lb

## 2017-07-05 DIAGNOSIS — B259 Cytomegaloviral disease, unspecified: Secondary | ICD-10-CM

## 2017-07-05 DIAGNOSIS — A0839 Other viral enteritis: Secondary | ICD-10-CM

## 2017-07-05 DIAGNOSIS — I208 Other forms of angina pectoris: Secondary | ICD-10-CM

## 2017-07-05 DIAGNOSIS — R634 Abnormal weight loss: Secondary | ICD-10-CM

## 2017-07-05 DIAGNOSIS — Z5181 Encounter for therapeutic drug level monitoring: Secondary | ICD-10-CM | POA: Diagnosis not present

## 2017-07-05 LAB — COMPREHENSIVE METABOLIC PANEL
AG Ratio: 1 (calc) (ref 1.0–2.5)
ALT: 10 U/L (ref 6–29)
AST: 14 U/L (ref 10–35)
Albumin: 2.9 g/dL — ABNORMAL LOW (ref 3.6–5.1)
Alkaline phosphatase (APISO): 103 U/L (ref 33–130)
BUN / CREAT RATIO: 20 (calc) (ref 6–22)
BUN: 10 mg/dL (ref 7–25)
CO2: 29 mmol/L (ref 20–32)
CREATININE: 0.51 mg/dL — AB (ref 0.60–0.88)
Calcium: 8.7 mg/dL (ref 8.6–10.4)
Chloride: 102 mmol/L (ref 98–110)
GLOBULIN: 2.9 g/dL (ref 1.9–3.7)
Glucose, Bld: 134 mg/dL — ABNORMAL HIGH (ref 65–99)
Potassium: 4.5 mmol/L (ref 3.5–5.3)
SODIUM: 138 mmol/L (ref 135–146)
Total Bilirubin: 0.3 mg/dL (ref 0.2–1.2)
Total Protein: 5.8 g/dL — ABNORMAL LOW (ref 6.1–8.1)

## 2017-07-05 LAB — CBC WITH DIFFERENTIAL/PLATELET
BASOS ABS: 38 {cells}/uL (ref 0–200)
BASOS PCT: 0.7 %
EOS ABS: 11 {cells}/uL — AB (ref 15–500)
Eosinophils Relative: 0.2 %
HEMATOCRIT: 29.8 % — AB (ref 35.0–45.0)
HEMOGLOBIN: 9.5 g/dL — AB (ref 11.7–15.5)
LYMPHS ABS: 2014 {cells}/uL (ref 850–3900)
MCH: 29.6 pg (ref 27.0–33.0)
MCHC: 31.9 g/dL — AB (ref 32.0–36.0)
MCV: 92.8 fL (ref 80.0–100.0)
MONOS PCT: 7.5 %
MPV: 9 fL (ref 7.5–12.5)
NEUTROS ABS: 2932 {cells}/uL (ref 1500–7800)
Neutrophils Relative %: 54.3 %
Platelets: 375 10*3/uL (ref 140–400)
RBC: 3.21 10*6/uL — ABNORMAL LOW (ref 3.80–5.10)
RDW: 17 % — ABNORMAL HIGH (ref 11.0–15.0)
Total Lymphocyte: 37.3 %
WBC: 5.4 10*3/uL (ref 3.8–10.8)
WBCMIX: 405 {cells}/uL (ref 200–950)

## 2017-07-05 NOTE — Progress Notes (Signed)
   Subjective:    Patient ID: Sharon Gilmore, female    DOB: [birth year], 83 y.o.   MRN: [id number]  HPI Here for follow up of CMV colitis.   Started on ganciclovir 10/25 after colonscopy noted CMV.  Has had more than a 20lb weight loss, chronic diarrhea.  Colonoscopy by Dr. Matthias HughsBuccini noted CMV on pathology.  Since starting ganciclovir she has noted some improvement.  Started initially on 900 mg twice a day and reduced to 450 mg twice a day on 11/6.  She still has loose stools but overall seems better though has some good days and some worse.  Started lasix as well and improved leg edema.  Followed by Dr. Johney FrameAllred.  Due for BMP today.  No fever, no chills.     Review of Systems  Constitutional: Positive for fatigue. Negative for fever.  Gastrointestinal: Positive for diarrhea. Negative for nausea.  Skin: Negative for rash.  Neurological: Negative for dizziness.       Objective:   Physical Exam  Constitutional: She appears well-developed and well-nourished. No distress.  Eyes: No scleral icterus.  Cardiovascular: Normal rate, regular rhythm and normal heart sounds.  No murmur heard. Pulmonary/Chest: Effort normal and breath sounds normal.  Skin: No rash noted.   SH: no tobacco       Assessment & Plan:

## 2017-07-05 NOTE — Assessment & Plan Note (Signed)
Seems to have had some improvement in her overall health.  She will complete about 4 weeks on 11/23 and will stop then. Hopefully some improvement over the next few months.   She can follow up if needed.

## 2017-07-05 NOTE — Patient Instructions (Signed)
Take Valcyte through 11/23 and then stop.

## 2017-07-05 NOTE — Assessment & Plan Note (Signed)
Some weight change but appears to be more related to lasix increase.  She will continue to monitor this.  She does have an appt with Dr. Matthias HughsBuccini tomorrow and asking me if she should go.  I told her I would copy the note with her concern to Dr. Matthias HughsBuccini.

## 2017-07-05 NOTE — Assessment & Plan Note (Signed)
I will check her WBC and LFTs today.

## 2017-07-06 ENCOUNTER — Telehealth: Payer: Self-pay | Admitting: *Deleted

## 2017-07-06 NOTE — Telephone Encounter (Signed)
Patient called back and MD message relayed. A copy of her labs were mailed to her home.

## 2017-07-06 NOTE — Telephone Encounter (Signed)
-----   Message from Gardiner Barefootobert W Comer, MD sent at 07/06/2017 11:06 AM EST ----- Please let her know her labs look good.  No concerns and her anemia and albumin (nutrition) are a bit better. thanks

## 2017-07-06 NOTE — Telephone Encounter (Signed)
Called and left patient a voice mail to call for lab results. Sharon Gilmore CMA

## 2017-07-13 ENCOUNTER — Ambulatory Visit: Payer: Medicare Other | Admitting: Internal Medicine

## 2017-08-18 ENCOUNTER — Other Ambulatory Visit: Payer: Self-pay

## 2017-08-18 ENCOUNTER — Inpatient Hospital Stay (HOSPITAL_COMMUNITY): Payer: Medicare Other

## 2017-08-18 ENCOUNTER — Encounter (HOSPITAL_COMMUNITY): Payer: Self-pay | Admitting: Internal Medicine

## 2017-08-18 ENCOUNTER — Inpatient Hospital Stay (HOSPITAL_COMMUNITY)
Admission: AD | Admit: 2017-08-18 | Discharge: 2017-08-25 | DRG: 389 | Disposition: A | Discharge status: Skilled Nursing Facility | Payer: Medicare Other | Source: Ambulatory Visit | Attending: Internal Medicine | Admitting: Internal Medicine

## 2017-08-18 DIAGNOSIS — Z981 Arthrodesis status: Secondary | ICD-10-CM

## 2017-08-18 DIAGNOSIS — R634 Abnormal weight loss: Secondary | ICD-10-CM | POA: Diagnosis present

## 2017-08-18 DIAGNOSIS — Z79899 Other long term (current) drug therapy: Secondary | ICD-10-CM

## 2017-08-18 DIAGNOSIS — Z885 Allergy status to narcotic agent status: Secondary | ICD-10-CM

## 2017-08-18 DIAGNOSIS — Z888 Allergy status to other drugs, medicaments and biological substances status: Secondary | ICD-10-CM | POA: Diagnosis not present

## 2017-08-18 DIAGNOSIS — Z7982 Long term (current) use of aspirin: Secondary | ICD-10-CM | POA: Diagnosis not present

## 2017-08-18 DIAGNOSIS — Z950 Presence of cardiac pacemaker: Secondary | ICD-10-CM | POA: Diagnosis present

## 2017-08-18 DIAGNOSIS — A0839 Other viral enteritis: Secondary | ICD-10-CM | POA: Diagnosis present

## 2017-08-18 DIAGNOSIS — J45909 Unspecified asthma, uncomplicated: Secondary | ICD-10-CM | POA: Diagnosis present

## 2017-08-18 DIAGNOSIS — I2510 Atherosclerotic heart disease of native coronary artery without angina pectoris: Secondary | ICD-10-CM | POA: Diagnosis present

## 2017-08-18 DIAGNOSIS — Z880 Allergy status to penicillin: Secondary | ICD-10-CM | POA: Diagnosis not present

## 2017-08-18 DIAGNOSIS — Z881 Allergy status to other antibiotic agents status: Secondary | ICD-10-CM

## 2017-08-18 DIAGNOSIS — R109 Unspecified abdominal pain: Secondary | ICD-10-CM | POA: Diagnosis present

## 2017-08-18 DIAGNOSIS — Z886 Allergy status to analgesic agent status: Secondary | ICD-10-CM

## 2017-08-18 DIAGNOSIS — K633 Ulcer of intestine: Secondary | ICD-10-CM | POA: Diagnosis present

## 2017-08-18 DIAGNOSIS — E876 Hypokalemia: Secondary | ICD-10-CM

## 2017-08-18 DIAGNOSIS — I10 Essential (primary) hypertension: Secondary | ICD-10-CM | POA: Diagnosis present

## 2017-08-18 DIAGNOSIS — Z87891 Personal history of nicotine dependence: Secondary | ICD-10-CM

## 2017-08-18 DIAGNOSIS — I5032 Chronic diastolic (congestive) heart failure: Secondary | ICD-10-CM | POA: Diagnosis not present

## 2017-08-18 DIAGNOSIS — B259 Cytomegaloviral disease, unspecified: Secondary | ICD-10-CM

## 2017-08-18 DIAGNOSIS — Z8249 Family history of ischemic heart disease and other diseases of the circulatory system: Secondary | ICD-10-CM | POA: Diagnosis not present

## 2017-08-18 DIAGNOSIS — R1030 Lower abdominal pain, unspecified: Secondary | ICD-10-CM | POA: Diagnosis not present

## 2017-08-18 DIAGNOSIS — D72819 Decreased white blood cell count, unspecified: Secondary | ICD-10-CM | POA: Diagnosis present

## 2017-08-18 DIAGNOSIS — R1010 Upper abdominal pain, unspecified: Secondary | ICD-10-CM | POA: Diagnosis not present

## 2017-08-18 DIAGNOSIS — K56699 Other intestinal obstruction unspecified as to partial versus complete obstruction: Secondary | ICD-10-CM | POA: Diagnosis present

## 2017-08-18 HISTORY — DX: Cytomegaloviral disease, unspecified: B25.9

## 2017-08-18 HISTORY — DX: Cytomegaloviral disease, unspecified: A08.39

## 2017-08-18 LAB — CREATININE, SERUM
CREATININE: 0.53 mg/dL (ref 0.44–1.00)
GFR calc Af Amer: >60 mL/min (ref >60)

## 2017-08-18 LAB — CBC WITH DIFFERENTIAL/PLATELET
Basophils Absolute: 0 10*3/uL (ref 0.0–0.1)
Basophils Relative: 0 %
Eosinophils Absolute: 0.3 10*3/uL (ref 0.0–0.7)
Eosinophils Relative: 4 %
HEMATOCRIT: 31.1 % — AB (ref 36.0–46.0)
HEMOGLOBIN: 9.7 g/dL — AB (ref 12.0–15.0)
LYMPHS ABS: 1.7 10*3/uL (ref 0.7–4.0)
Lymphocytes Relative: 23 %
MCH: 27.4 pg (ref 26.0–34.0)
MCHC: 31.2 g/dL (ref 30.0–36.0)
MCV: 87.9 fL (ref 78.0–100.0)
MONOS PCT: 11 %
Monocytes Absolute: 0.8 10*3/uL (ref 0.1–1.0)
NEUTROS ABS: 4.8 10*3/uL (ref 1.7–7.7)
Neutrophils Relative %: 62 %
Platelets: 370 10*3/uL (ref 150–400)
RBC: 3.54 MIL/uL — AB (ref 3.87–5.11)
RDW: 16.7 % — ABNORMAL HIGH (ref 11.5–15.5)
WBC: 7.6 10*3/uL (ref 4.0–10.5)

## 2017-08-18 MED ORDER — DRONABINOL 2.5 MG PO CAPS
2.5000 mg | ORAL_CAPSULE | Freq: Two times a day (BID) | ORAL | Status: DC
  Administered 2017-08-19 – 2017-08-25 (×12): 2.5 mg via ORAL
  Filled 2017-08-18 (×12): qty 1

## 2017-08-18 MED ORDER — IOPAMIDOL (ISOVUE-300) INJECTION 61%
30.0000 mL | Freq: Once | INTRAVENOUS | Status: AC | PRN
  Administered 2017-08-18: 30 mL via ORAL

## 2017-08-18 MED ORDER — PANTOPRAZOLE SODIUM 40 MG IV SOLR
40.0000 mg | Freq: Two times a day (BID) | INTRAVENOUS | Status: DC
  Administered 2017-08-18 – 2017-08-23 (×11): 40 mg via INTRAVENOUS
  Filled 2017-08-18 (×13): qty 40

## 2017-08-18 MED ORDER — IOPAMIDOL (ISOVUE-300) INJECTION 61%
INTRAVENOUS | Status: AC
  Administered 2017-08-18: 100 mL via INTRAVENOUS
  Filled 2017-08-18: qty 100

## 2017-08-18 MED ORDER — KCL IN DEXTROSE-NACL 10-5-0.45 MEQ/L-%-% IV SOLN
INTRAVENOUS | Status: DC
  Administered 2017-08-18: 1000 mL via INTRAVENOUS
  Administered 2017-08-19: 12:00:00 via INTRAVENOUS
  Administered 2017-08-20 (×2): 1000 mL via INTRAVENOUS
  Administered 2017-08-21 – 2017-08-25 (×3): via INTRAVENOUS
  Filled 2017-08-18 (×11): qty 1000

## 2017-08-18 MED ORDER — SUCRALFATE 1 GM/10ML PO SUSP
1.0000 g | Freq: Three times a day (TID) | ORAL | Status: DC
  Administered 2017-08-19 – 2017-08-25 (×20): 1 g via ORAL
  Filled 2017-08-18 (×22): qty 10

## 2017-08-18 MED ORDER — LIP MEDEX EX OINT
TOPICAL_OINTMENT | CUTANEOUS | Status: DC | PRN
  Filled 2017-08-18 (×2): qty 7

## 2017-08-18 MED ORDER — HEPARIN SODIUM (PORCINE) 5000 UNIT/ML IJ SOLN
5000.0000 [IU] | Freq: Three times a day (TID) | INTRAMUSCULAR | Status: DC
  Administered 2017-08-18 – 2017-08-25 (×18): 5000 [IU] via SUBCUTANEOUS
  Filled 2017-08-18 (×18): qty 1

## 2017-08-18 MED ORDER — GERHARDT'S BUTT CREAM
1.0000 "application " | TOPICAL_CREAM | Freq: Four times a day (QID) | CUTANEOUS | Status: DC | PRN
  Filled 2017-08-18: qty 1

## 2017-08-18 MED ORDER — BLISTEX MEDICATED EX OINT: TOPICAL_OINTMENT | CUTANEOUS | Status: DC | PRN

## 2017-08-18 MED ORDER — IOPAMIDOL (ISOVUE-370) INJECTION 76%
INTRAVENOUS | Status: AC
  Filled 2017-08-18: qty 100

## 2017-08-18 MED ORDER — IOPAMIDOL (ISOVUE-300) INJECTION 61%
INTRAVENOUS | Status: AC
  Administered 2017-08-18: 15 mL
  Filled 2017-08-18: qty 30

## 2017-08-18 MED ORDER — PROCHLORPERAZINE EDISYLATE 5 MG/ML IJ SOLN
10.0000 mg | INTRAMUSCULAR | Status: DC | PRN
  Administered 2017-08-22: 10 mg via INTRAVENOUS
  Filled 2017-08-18: qty 2

## 2017-08-18 NOTE — H&P (Addendum)
History and Physical    Sharon Gilmore:[id number] DOB: Dec 27, 1933 DOA: 08/18/2017  PCP: Pearson Forster, MD  Patient coming from: home  I have personally briefly reviewed patient's old medical records in [facility]  Chief Complaint:   HPI: Sharon Gilmore is a 84 y.o. female with medical history significant of spinal fusion surgery back in October recently diagnosed with CMV colitis with sigmoidoscopy several weeks ago which showed multiple colonic ulcerations with biopsy-proven CMV, referred to the infectious disease clinic  which completed her treatment with valacyclovir there was some improvement in her diarrhea as her stools were more formed but still going about 8-9 times a day in small amounts, with a negative C. difficile colitis PCR, who had a colonoscopy and EGD on 08/18/2017, the EGD showed bilious gastric fluid and some erythema and colonoscopy showed strictures approximately 15 cm from anus unable to trans-first with an ultrasound Slim colonoscope, so we were consulted for further evaluation and treatment.  He also relates she continues to have persistent nausea but no vomiting.  And some abdominal cramping.   Review of Systems: As per HPI otherwise 10 point review of systems negative.    Past Medical History:  Diagnosis Date  . Abnormal barium swallow 09/1999  . Arthritis   . Asthma   . Carpal tunnel syndrome   . CMV colitis (HCC)   . Depression   . Erythema nodosum   . H/O Doppler ultrasound    LEFT LEG 11/1997  . Hypertension   . Psoriasis   . Rosacea   . Seizures (HCC) 1980  . Swallowing disorder     Past Surgical History:  Procedure Laterality Date  . BILATERAL CARPAL TUNNEL RELEASE    . CHOLECYSTECTOMY    . COLONOSCOPY  2004  . FLEXIBLE SIGMOIDOSCOPY N/A 05/17/2017   Procedure: FLEXIBLE SIGMOIDOSCOPY;  Surgeon: Carman Ching, MD;  Location: [facility] ENDOSCOPY;  Service: Endoscopy;  Laterality: N/A;  . INCISIONAL HERNIA REPAIR    . LEFT HEART  CATH AND CORONARY ANGIOGRAPHY N/A 03/15/2017   Procedure: Left Heart Cath and Coronary Angiography;  Surgeon: Runell Gess, MD;  Location: [facility] INVASIVE CV LAB;  Service: Cardiovascular;  Laterality: N/A;  . PACEMAKER IMPLANT N/A 03/09/2017   Medtronic Azure XT MRI conditional dual-chamber pacemaker for symptomatic mobitz II second degree AV block by Dr Johney Frame  . TONSILLECTOMY       reports that she quit smoking about 37 years ago. she has never used smokeless tobacco. She reports that she drinks alcohol. She reports that she does not use drugs.  Allergies  Allergen Reactions  . Celebrex [Celecoxib] Other (See Comments)    LEG ACHING   . Codeine Nausea And Vomiting  . Cymbalta [Duloxetine Hcl] Other (See Comments)    Sensitive per MAR  . Doxycycline Swelling and Other (See Comments)    Facial swelling  . Meloxicam Other (See Comments)    TUMMY ACHE, INEFFECTIVE  . Nortriptyline Other (See Comments)    unknown  . Percocet [Oxycodone-Acetaminophen] Nausea And Vomiting  . Penicillins Rash and Other (See Comments)    Has patient had a PCN reaction causing immediate rash, facial/tongue/throat swelling, SOB or lightheadedness with hypotension: No Has patient had a PCN reaction causing severe rash involving mucus membranes or skin necrosis: No Has patient had a PCN reaction that required hospitalization No Has patient had a PCN reaction occurring within the last 10 years: No If all of the above answers are "NO", then may proceed  with Cephalosporin use.    Family History  Problem Relation Age of Onset  . CVA Mother   . Stroke Mother   . Parkinson's disease Father   . Leukemia Brother   . CVA Maternal Grandmother   . Stroke Maternal Grandmother   . Peripheral vascular disease Maternal Grandfather   . Breast cancer Paternal Grandmother   . Diabetes Paternal Grandmother   . CVA Paternal Grandfather   . Stroke Paternal Grandfather   . Pancreatic cancer Paternal Aunt   . Stroke  Paternal Aunt   . Lung cancer Paternal Uncle   . Pancreatic cancer Brother   . Coronary artery disease Brother     Prior to Admission medications   Medication Sig Start Date End Date Taking? Authorizing Provider  acetaminophen (TYLENOL) 500 MG tablet Take 1,000 mg by mouth as needed for moderate pain.     [provider]  antiseptic oral rinse (BIOTENE) LIQD 15 mLs by Mouth Rinse route 3 (three) times daily before meals. spray    [provider]  aspirin EC 81 MG tablet Take 81 mg by mouth at bedtime.     [provider]  Cholecalciferol (VITAMIN D3) 2000 units TABS Take 2,000 Units by mouth daily.     [provider]  cyclobenzaprine (FLEXERIL) 5 MG tablet Take 5 mg by mouth 3 (three) times daily as needed for muscle spasms.    [provider]  furosemide (LASIX) 20 MG tablet Take 1 tablet (20 mg total) daily by mouth. 06/25/17 09/23/17  Allred, Fayrene FearingJames, MD  gabapentin (NEURONTIN) 300 MG capsule Take 300 mg by mouth at bedtime.    [provider]  Hydrocortisone (GERHARDT'S BUTT CREAM) CREA Apply 1 application topically 4 (four) times daily as needed for irritation. 05/10/17   Garth Bignessimberlake, Kathryn, MD  hydroxypropyl methylcellulose / hypromellose (ISOPTO TEARS / GONIOVISC) 2.5 % ophthalmic solution Place 1 drop into both eyes 4 (four) times daily as needed for dry eyes.    [provider]  nystatin (MYCOSTATIN) 100000 UNIT/ML suspension Take 5 mLs by mouth 3 (three) times daily. For seven days starting 05/15/2017    [provider]  polyethylene glycol (MIRALAX / GLYCOLAX) packet Take 17 g by mouth 3 (three) times daily as needed (constipation).    [provider]  potassium chloride SA (K-DUR,KLOR-CON) 20 MEQ tablet Take 1 tablet (20 mEq total) by mouth daily. Patient not taking: Reported on 06/22/2017 05/24/17   Tereso NewcomerWeaver, Scott T, PA-C  saccharomyces boulardii (FLORASTOR) 250 MG capsule Take 1 capsule (250 mg total) by  mouth 2 (two) times daily. 05/10/17   Garth Bignessimberlake, Kathryn, MD  traMADol (ULTRAM) 50 MG tablet Take by mouth every 6 (six) hours as needed.    [provider]  traZODone (DESYREL) 50 MG tablet Take 1 tablet (50 mg total) by mouth at bedtime as needed for sleep. 05/10/17   Garth Bignessimberlake, Kathryn, MD    Physical Exam: There were no vitals filed for this visit.  Constitutional: NAD, calm, comfortable There were no vitals filed for this visit. Eyes: PERRL, lids and conjunctivae normal ENMT: Mucous membranes are moist. Posterior pharynx clear of any exudate or lesions.Normal dentition.  Neck: normal, supple, no masses, no thyromegaly Respiratory: clear to auscultation bilaterally, no wheezing, no crackles. Normal respiratory effort. No accessory muscle use.  Cardiovascular: Regular rate and rhythm, no murmurs / rubs / gallops. No extremity edema. 2+ pedal pulses. No carotid bruits.  Abdomen: no tenderness, no masses palpated.  Hyperactive bowel sounds.  No rebound or guarding. Musculoskeletal: no clubbing / cyanosis. No joint deformity upper and lower extremities. Good ROM, no contractures. Normal muscle tone.  Skin: no rashes, lesions, ulcers. No induration Neurologic: CN 2-12 grossly intact. Sensation intact, DTR normal. Strength 5/5 in all 4.  Psychiatric: Normal judgment and insight. Alert and oriented x 3. Normal mood.    Labs on Admission: I have personally reviewed following labs and imaging studies  CBC: No results for input(s): WBC, NEUTROABS, HGB, HCT, MCV, PLT in the last 168 hours. Basic Metabolic Panel: No results for input(s): NA, K, CL, CO2, GLUCOSE, BUN, CREATININE, CALCIUM, MG, PHOS in the last 168 hours. GFR: CrCl cannot be calculated (Patient's most recent lab result is older than the maximum 21 days allowed.). Liver Function Tests: No results for input(s): AST, ALT, ALKPHOS, BILITOT, PROT, ALBUMIN in the last 168 hours. No results for input(s): LIPASE, AMYLASE in the  last 168 hours. No results for input(s): AMMONIA in the last 168 hours. Coagulation Profile: No results for input(s): INR, PROTIME in the last 168 hours. Cardiac Enzymes: No results for input(s): CKTOTAL, CKMB, CKMBINDEX, TROPONINI in the last 168 hours. BNP (last 3 results) Recent Labs    05/24/17 1150  PROBNP 397   HbA1C: No results for input(s): HGBA1C in the last 72 hours. CBG: No results for input(s): GLUCAP in the last 168 hours. Lipid Profile: No results for input(s): CHOL, HDL, LDLCALC, TRIG, CHOLHDL, LDLDIRECT in the last 72 hours. Thyroid Function Tests: No results for input(s): TSH, T4TOTAL, FREET4, T3FREE, THYROIDAB in the last 72 hours. Anemia Panel: No results for input(s): VITAMINB12, FOLATE, FERRITIN, TIBC, IRON, RETICCTPCT in the last 72 hours. Urine analysis:    Component Value Date/Time   COLORURINE AMBER (A) 05/02/2017 1141   APPEARANCEUR CLOUDY (A) 05/02/2017 1141   LABSPEC 1.017 05/02/2017 1141   PHURINE 5.0 05/02/2017 1141   GLUCOSEU NEGATIVE 05/02/2017 1141   HGBUR NEGATIVE 05/02/2017 1141   BILIRUBINUR SMALL (A) 05/02/2017 1141   KETONESUR NEGATIVE 05/02/2017 1141   PROTEINUR 30 (A) 05/02/2017 1141   NITRITE NEGATIVE 05/02/2017 1141   LEUKOCYTESUR MODERATE (A) 05/02/2017 1141    Radiological Exams on Admission: No results found.  EKG: Independently reviewed.pending  Assessment/Plan Weight loss, non-intentional/ Colonic stricture (HCC)/ Abdominal pain Had a recent colonoscopy that showed a significant f stricture.  At this point we will try to control her nausea and vomiting with Marinol and Compazine as she has had adverse reaction to Zofran and Phenergan as per patient.  Patient would like to try to avoid NG tube but if she starts vomiting or we cannot control her nausea she has agreed to proceed with NG tube. Get a CT scan of the abdomen and pelvis with IV and oral contrast. We will consult general surgery.  Hold aspirin. Place the patient  n.p.o. except for ice chips and meds. We will start her on oral Carafate and IV Protonix.  Essential hypertension:  We will hold on on Lasix, get a CMP to make sure she is not in acute renal failure due to her decreased oral intake and nausea and vomiting.  History of CMV colitis (HCC)  Cardiac pacemaker in situ Check a 12-lead EKG.   DVT prophylaxis: Lovenox Code Status: full Family Communication: sone Disposition Plan: unable todetermine Consults called: surgery and [facility] they will see patient in the mroning Admission status: inpatient   Marinda Elk MD [facility] Pager [phone]  If 7PM-7AM, please contact night-coverage [email or web address] Password  [email or web address]  08/18/2017, 5:15 PM

## 2017-08-18 NOTE — Consult Note (Signed)
Reason for Consult:colonic stricture Referring Physician: Aileen Fass, MD  Sharon Gilmore is an 84 y.o. female.  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  The patient had a lumbar decompression and fusion on September 7 by Dr. Vertell Limber at [facility]  She was discharged to rehab and on 16 September, she was readmitted with sepsis and a presumed UTI.  She was eventually diagnosed with colitis.  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  She has been on valacyclovir under the direction of infectious disease.  However, she has continued to have small volume liquid stools and bloating.  She denies any abdominal pain.  She denies any recent fevers or chills.  She denies blood in her stools.  Today, Dr. Cristina Gong attempted a flexible sigmoidoscopy but could not pass 15 cm even with the pediatric scope.  She was admitted to the hospitalist service and a CT scan was performed.  This showed active colitis in the sigmoid and descending colon.  She is here for treatment.  Past Medical History:  Diagnosis Date  . Abnormal barium swallow 09/1999  . Arthritis   . Asthma   . Carpal tunnel syndrome   . 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 Depression   . Erythema nodosum   . H/O Doppler ultrasound    LEFT LEG 11/1997  . Hypertension   . Psoriasis   . Rosacea   . Seizures ([place]) 1980  . Swallowing disorder     Past Surgical History:  Procedure Laterality Date  . BILATERAL CARPAL TUNNEL RELEASE    . CHOLECYSTECTOMY    . COLONOSCOPY  2004  . FLEXIBLE SIGMOIDOSCOPY N/A 05/17/2017   Procedure: FLEXIBLE SIGMOIDOSCOPY;  Surgeon: Laurence Spates, MD;  Location: [facility];  Service: Endoscopy;  Laterality: N/A;  . INCISIONAL HERNIA REPAIR    . LEFT HEART CATH AND CORONARY ANGIOGRAPHY N/A 03/15/2017   Procedure: Left Heart Cath and Coronary Angiography;  Surgeon: Lorretta Harp, MD;  Location: [facility];  Service: Cardiovascular;  Laterality: N/A;  . PACEMAKER  IMPLANT N/A 03/09/2017   Medtronic Azure XT MRI conditional dual-chamber pacemaker for symptomatic mobitz II second degree AV block by Dr Rayann Heman  . TONSILLECTOMY      Family History  Problem Relation Age of Onset  . CVA Mother   . Stroke Mother   . Parkinson's disease Father   . Leukemia Brother   . CVA Maternal Grandmother   . Stroke Maternal Grandmother   . Peripheral vascular disease Maternal Grandfather   . Breast cancer Paternal Grandmother   . Diabetes Paternal Grandmother   . CVA Paternal Grandfather   . Stroke Paternal Grandfather   . Pancreatic cancer Paternal Aunt   . Stroke Paternal Aunt   . Lung cancer Paternal Uncle   . Pancreatic cancer Brother   . Coronary artery disease Brother     Social History:  reports that she quit smoking about 37 years ago. she has never used smokeless tobacco. She reports that she drinks alcohol. She reports that she does not use drugs.  Allergies:  Allergies  Allergen Reactions  . Celebrex [Celecoxib] Other (See Comments)    LEG ACHING   . Codeine Nausea And Vomiting  . Cymbalta [Duloxetine Hcl] Other (See Comments)    Sensitive per MAR  . Doxycycline Swelling and Other (See Comments)    Facial swelling  . Meloxicam Other (See Comments)    TUMMY ACHE, INEFFECTIVE  .  Nortriptyline Other (See Comments)    unknown  . Percocet [Oxycodone-Acetaminophen] Nausea And Vomiting  . Penicillins Rash and Other (See Comments)    Has patient had a PCN reaction causing immediate rash, facial/tongue/throat swelling, SOB or lightheadedness with hypotension: No Has patient had a PCN reaction causing severe rash involving mucus membranes or skin necrosis: No Has patient had a PCN reaction that required hospitalization No Has patient had a PCN reaction occurring within the last 10 years: No If all of the above answers are "NO", then may proceed with Cephalosporin use.    Medications:  Prior to Admission:  Medications Prior to Admission   Medication Sig Dispense Refill Last Dose  . aspirin EC 81 MG tablet Take 81 mg by mouth at bedtime.    08/17/2017 at Unknown time  . Cholecalciferol (VITAMIN D3) 2000 units TABS Take 2,000 Units by mouth daily.    08/18/2017 at Unknown time  . furosemide (LASIX) 20 MG tablet Take 1 tablet (20 mg total) daily by mouth. 90 tablet 3 08/18/2017 at Unknown time  . Hydrocortisone (GERHARDT'S BUTT CREAM) CREA Apply 1 application topically 4 (four) times daily as needed for irritation.   08/17/2017 at Unknown time  . Multiple Vitamins-Minerals (PRESERVISION AREDS PO) Take 1 tablet by mouth 2 (two) times daily.   08/18/2017 at Unknown time  . saccharomyces boulardii (FLORASTOR) 250 MG capsule Take 1 capsule (250 mg total) by mouth 2 (two) times daily.   08/18/2017 at Unknown time  . potassium chloride SA (K-DUR,KLOR-CON) 20 MEQ tablet Take 1 tablet (20 mEq total) by mouth daily. (Patient not taking: Reported on 06/22/2017) 90 tablet 3 Not Taking at Unknown time  . traZODone (DESYREL) 50 MG tablet Take 1 tablet (50 mg total) by mouth at bedtime as needed for sleep. (Patient not taking: Reported on 08/18/2017)   Not Taking at Unknown time    Results for orders placed or performed during the hospital encounter of 08/18/17 (from the past 48 hour(s))  CBC with Differential/Platelet     Status: Abnormal   Collection Time: 08/18/17  5:40 PM  Result Value Ref Range   WBC 7.6 4.0 - 10.5 K/uL   RBC 3.54 (L) 3.87 - 5.11 MIL/uL   Hemoglobin 9.7 (L) 12.0 - 15.0 g/dL   HCT 31.1 (L) 36.0 - 46.0 %   MCV 87.9 78.0 - 100.0 fL   MCH 27.4 26.0 - 34.0 pg   MCHC 31.2 30.0 - 36.0 g/dL   RDW 16.7 (H) 11.5 - 15.5 %   Platelets 370 150 - 400 K/uL   Neutrophils Relative % 62 %   Neutro Abs 4.8 1.7 - 7.7 K/uL   Lymphocytes Relative 23 %   Lymphs Abs 1.7 0.7 - 4.0 K/uL   Monocytes Relative 11 %   Monocytes Absolute 0.8 0.1 - 1.0 K/uL   Eosinophils Relative 4 %   Eosinophils Absolute 0.3 0.0 - 0.7 K/uL   Basophils Relative 0 %    Basophils Absolute 0.0 0.0 - 0.1 K/uL  Creatinine, serum     Status: None   Collection Time: 08/18/17  5:40 PM  Result Value Ref Range   Creatinine, Ser 0.53 0.44 - 1.00 mg/dL   GFR calc non Af Amer >60 >60 mL/min   GFR calc Af Amer >60 >60 mL/min    Comment: (NOTE) The eGFR has been calculated using the CKD EPI equation. This calculation has not been validated in all clinical situations. eGFR's persistently <60 mL/min signify possible Chronic Kidney  Disease.     Ct Abdomen Pelvis W Contrast  Result Date: 08/18/2017 CLINICAL DATA:  Colonoscopy earlier today with stricture 15 cm from the anus. Recent diagnosis and treatment for CMV colitis. EXAM: CT ABDOMEN AND PELVIS WITH CONTRAST TECHNIQUE: Multidetector CT imaging of the abdomen and pelvis was performed using the standard protocol following bolus administration of intravenous contrast. CONTRAST:  100 cc Isovue-300 IV COMPARISON:  Most recent CT 05/16/2017, additional priors FINDINGS: Lower chest: Bibasilar atelectasis, right greater than left. Resolved pleural effusions. Pacemaker is partially included. Hepatobiliary: No focal hepatic lesion. Postcholecystectomy. Prominent common bile duct measuring 12 mm, with mild central intrahepatic biliary ductal dilatation. No visualized choledocholithiasis. Pancreas: Parenchymal atrophy. No ductal dilatation or inflammation. Spleen: Normal in size without focal abnormality. Splenule at the hilum. Adrenals/Urinary Tract: No adrenal nodule. No hydronephrosis or perinephric edema. Homogeneous renal enhancement with symmetric excretion on delayed phase imaging. Urinary bladder is physiologically distended without wall thickening. Stomach/Bowel: Moderate length segment of colonic wall thickening in the mid sigmoid with pericolonic edema. Mild rectal wall thickening. Presacral edema appears similar to prior. Colonic wall thickening of the proximal descending colon with persistent but improved pericolonic  inflammatory change. Moderate to large stool burden in the more proximal colon. Small volume of formed stool in the distal descending and proximal sigmoid colon with minimal diverticulosis. No diverticulitis, pneumatosis or perforation. Administered enteric contrast reaches the hepatic flexure. No small bowel inflammation or obstruction. Normal appendix. Stomach is nondistended. Vascular/Lymphatic: Aortic and branch atherosclerosis. Circumaortic left renal vein. Small retroperitoneal nodes. No definite pericolonic or pelvic adenopathy. Reproductive: Atrophic uterus. Low-density structure in the right adnexa measuring 2.4 cm, higher than simple fluid density, similar in size compared to prior exams. No air in the endometrial cavity, minimal air in the vagina. Other: No free air or ascites. Presacral edema appear similar to prior exam. Musculoskeletal: Posterior L2 through L5 fusion with posterior rods and intrapedicular screws. The right L2 pedicle screw extends lateral to the vertebral body, unchanged. No acute osseous abnormality. IMPRESSION: 1. Moderate length sigmoid colonic wall thickening and pericolonic edema, similar in distribution to prior exam, likely corresponding to reported stricture on colonoscopy. Additional rectal and proximal descending colonic wall thickening are also similar in distribution. CT findings suggest active colitis. 2. Moderate proximal stool burden. 3. Postcholecystectomy with mild biliary dilatation. Recommend correlation with LFTs. If normal LFTs, findings likely secondary to prior cholecystectomy and no further evaluation is needed. If elevated, consider further evaluation with ERCP or MRCP. 4.  Aortic Atherosclerosis (ICD10-I70.0). Electronically Signed   By: Jeb Levering M.D.   On: 08/18/2017 22:35    Review of Systems  Constitutional: Negative for chills and fever.  HENT: Negative.   Eyes: Negative.   Respiratory: Negative.   Cardiovascular: Negative.    Gastrointestinal: Positive for diarrhea and nausea.       Bloating  Genitourinary: Negative.   Skin: Negative.   Neurological: Positive for weakness.  Endo/Heme/Allergies: Negative.   Psychiatric/Behavioral: Negative.   All other systems reviewed and are negative.  Blood pressure (!) 143/77, pulse 94, temperature 98.3 F (36.8 C), temperature source Oral, resp. rate 18, height 5' 4" (1.626 m), weight 75 kg (165 lb 5.5 oz), SpO2 95 %. Physical Exam  Constitutional: She is oriented to person, place, and time. She appears well-developed and well-nourished. No distress.  HENT:  Head: Normocephalic and atraumatic.  Mouth/Throat: Oropharynx is clear and moist.  Eyes: Conjunctivae are normal. Pupils are equal, round, and reactive to light. Right eye  exhibits no discharge. Left eye exhibits no discharge. No scleral icterus.  Neck: Neck supple. No tracheal deviation present.  Cardiovascular: Normal rate and intact distal pulses.  Respiratory: Effort normal. No respiratory distress.  GI: Soft. She exhibits distension. She exhibits no mass. There is no tenderness. There is no rebound and no guarding.  Musculoskeletal: She exhibits no edema, tenderness or deformity.  Neurological: She is alert and oriented to person, place, and time.  Skin: Skin is warm and dry. No rash noted. She is not diaphoretic. No erythema. No pallor.  Psychiatric: She has a normal mood and affect. Her behavior is normal. Judgment and thought content normal.    Assessment/Plan: Low sigmoid colon stricture Partial colonic obstruction Active colitis, likely secondary to CMV  I recommend consulting infectious disease to see if there is any value in repeating stool studies given her refractory colitis.  She may need to restart her Valcyte.  I also would ask them to see if they think that this could resolve adequately without surgical intervention.  It is a fairly brief period of time for her to develop a dense fibrous  stricture.  However, she has had a fairly lengthy period of treatment.  It may be that she would do better with resection.  Dr. Gerkin will resume care of this patient in the morning.  The patient is not completely obstructed as she is still passing gas and having diarrhea.  However she may not be able to successfully have a bowel prep.  This would impact whether she would have a diverting ostomy, partial colectomy with an end colostomy, or a resection and re-anastomosis.    08/18/2017, 11:08 PM     

## 2017-08-19 ENCOUNTER — Ambulatory Visit: Payer: Medicare Other | Admitting: Internal Medicine

## 2017-08-19 DIAGNOSIS — R1030 Lower abdominal pain, unspecified: Secondary | ICD-10-CM

## 2017-08-19 DIAGNOSIS — R634 Abnormal weight loss: Secondary | ICD-10-CM

## 2017-08-19 LAB — COMPREHENSIVE METABOLIC PANEL
ALBUMIN: 2.5 g/dL — AB (ref 3.5–5.0)
ALT: 12 U/L — ABNORMAL LOW (ref 14–54)
ANION GAP: 5 (ref 5–15)
AST: 14 U/L — AB (ref 15–41)
Alkaline Phosphatase: 85 U/L (ref 38–126)
BILIRUBIN TOTAL: 0.3 mg/dL (ref 0.3–1.2)
BUN: 6 mg/dL (ref 6–20)
CO2: 25 mmol/L (ref 22–32)
Calcium: 8.3 mg/dL — ABNORMAL LOW (ref 8.9–10.3)
Chloride: 106 mmol/L (ref 101–111)
Creatinine, Ser: 0.45 mg/dL (ref 0.44–1.00)
GFR calc Af Amer: >60 mL/min (ref >60)
GFR calc non Af Amer: >60 mL/min (ref >60)
GLUCOSE: 113 mg/dL — AB (ref 65–99)
POTASSIUM: 3.6 mmol/L (ref 3.5–5.1)
Sodium: 136 mmol/L (ref 135–145)
TOTAL PROTEIN: 5.7 g/dL — AB (ref 6.5–8.1)

## 2017-08-19 LAB — CBC
HCT: 29.1 % — ABNORMAL LOW (ref 36.0–46.0)
Hemoglobin: 9.3 g/dL — ABNORMAL LOW (ref 12.0–15.0)
MCH: 28.1 pg (ref 26.0–34.0)
MCHC: 32 g/dL (ref 30.0–36.0)
MCV: 87.9 fL (ref 78.0–100.0)
PLATELETS: 364 10*3/uL (ref 150–400)
RBC: 3.31 MIL/uL — AB (ref 3.87–5.11)
RDW: 16.8 % — ABNORMAL HIGH (ref 11.5–15.5)
WBC: 7.5 10*3/uL (ref 4.0–10.5)

## 2017-08-19 MED ORDER — SODIUM CHLORIDE 0.9 % IV SOLN: 5.0000 mg/kg | Freq: Two times a day (BID) | INTRAVENOUS | Status: DC

## 2017-08-19 MED ORDER — DICYCLOMINE HCL 10 MG PO CAPS
10.0000 mg | ORAL_CAPSULE | Freq: Once | ORAL | Status: AC
  Administered 2017-08-19: 10 mg via ORAL
  Filled 2017-08-19: qty 1

## 2017-08-19 MED ORDER — MORPHINE SULFATE (PF) 2 MG/ML IV SOLN: 1.0000 mg | INTRAVENOUS | Status: DC | PRN

## 2017-08-19 MED ORDER — SODIUM CHLORIDE 0.9 % IV SOLN
2.5000 mg/kg | Freq: Two times a day (BID) | INTRAVENOUS | Status: DC
  Administered 2017-08-19 – 2017-08-25 (×11): 155 mg via INTRAVENOUS
  Filled 2017-08-19 (×17): qty 155

## 2017-08-19 NOTE — Progress Notes (Signed)
PHARMACY NOTE:  ANTIMICROBIAL RENAL DOSAGE ADJUSTMENT  Current antimicrobial regimen includes a mismatch between antimicrobial dosage and estimated renal function.  As per policy approved by the Pharmacy & Therapeutics and Medical Executive Committees, the antimicrobial dosage will be adjusted accordingly.  Current antimicrobial dosage: Ganciclovir 5mg /kg IV q12h (using adjusted body weight)  Indication: PMH of recent CMV colitis, now with newly diagnosed colonic stricture  Renal Function:  Estimated Creatinine Clearance: 54.7 mL/min (by C-G formula based on SCr of 0.45 mg/dL). []      On intermittent HD, scheduled: []      On CRRT   Antimicrobial dosage has been changed to: Ganciclovir 2.5mg /kg IV q12h (using adjusted body weight) --dose change was discussed with Dr. Drue SecondSnider (ID)   Thank you for allowing pharmacy to be a part of this patient's care.  Jamse MeadGadhia, Sanam Marmo M, [facility] ClinicsRPH 08/19/2017 4:56 PM

## 2017-08-19 NOTE — Consult Note (Signed)
[facility] CONSULT Reason for consult: Rectal/sigmoid obstruction Referring Physician: [facility] hospitalist.  PCP: Dr. Olin Hauser.  Primary GI: Dr. Horris Latino is an 84 y.o. female.  HPI: Patient with a very interesting history.  She has had ulceration and inflammation in her rectosigmoid area for some time.I performed sigmoidoscopy 10/1 with marked ulceration and granulation tissue felt to be ischemic.  Biopsies at that time did not show any sign of CMV.  Several weeks later she continued to have symptoms and colonoscopy by Dr. Cristina Gong revealed marked inflammation at the rectosigmoid area with biopsies revealing inclusions for CMV.She was seen by ID and started on ganciclovir.  Unfortunately she continued to have chronic diarrhea and weight loss saw Dr. Cristina Gong in the office yesterday and sigmoidoscopy with the ultrathin scope showed marked narrowing and stricturing of the rectosigmoid area at approximately 20 cm from the anal verge and this area was unable to be passed with the ultrathin scope.  Patient was admitted.  She is n.p.o.CT yesterday showed marked colonic wall thickening of the sigmoid and distal descending colon consistent with colitis.  Similar to prior CT scans.  Has been seen by surgery and we are waiting for infectious disease to see again to see if further treatment is needed.  Past Medical History:  Diagnosis Date  . Abnormal barium swallow 09/1999  . Arthritis   . Asthma   . Carpal tunnel syndrome   . 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 Depression   . Erythema nodosum   . H/O Doppler ultrasound    LEFT LEG 11/1997  . Hypertension   . Psoriasis   . Rosacea   . Seizures ([place]) 1980  . Swallowing disorder     Past Surgical History:  Procedure Laterality Date  . BILATERAL CARPAL TUNNEL RELEASE    . CHOLECYSTECTOMY    . COLONOSCOPY  2004  . FLEXIBLE SIGMOIDOSCOPY N/A 05/17/2017   Procedure: FLEXIBLE SIGMOIDOSCOPY;  Surgeon: Laurence Spates, MD;  Location: [facility];  Service: Endoscopy;  Laterality: N/A;  . INCISIONAL HERNIA REPAIR    . LEFT HEART CATH AND CORONARY ANGIOGRAPHY N/A 03/15/2017   Procedure: Left Heart Cath and Coronary Angiography;  Surgeon: Lorretta Harp, MD;  Location: [facility];  Service: Cardiovascular;  Laterality: N/A;  . PACEMAKER IMPLANT N/A 03/09/2017   Medtronic Azure XT MRI conditional dual-chamber pacemaker for symptomatic mobitz II second degree AV block by Dr Rayann Heman  . TONSILLECTOMY      Family History  Problem Relation Age of Onset  . CVA Mother   . Stroke Mother   . Parkinson's disease Father   . Leukemia Brother   . CVA Maternal Grandmother   . Stroke Maternal Grandmother   . Peripheral vascular disease Maternal Grandfather   . Breast cancer Paternal Grandmother   . Diabetes Paternal Grandmother   . CVA Paternal Grandfather   . Stroke Paternal Grandfather   . Pancreatic cancer Paternal Aunt   . Stroke Paternal Aunt   . Lung cancer Paternal Uncle   . Pancreatic cancer Brother   . Coronary artery disease Brother     Social History:  reports that she quit smoking about 37 years ago. she has never used smokeless tobacco. She reports that she drinks alcohol. She reports that she does not use drugs.  Allergies:  Allergies  Allergen Reactions  . Celebrex [Celecoxib] Other (See Comments)    LEG ACHING   . Codeine Nausea And Vomiting  . Cymbalta [Duloxetine Hcl]  Other (See Comments)    Sensitive per MAR  . Doxycycline Swelling and Other (See Comments)    Facial swelling  . Meloxicam Other (See Comments)    TUMMY ACHE, INEFFECTIVE  . Nortriptyline Other (See Comments)    unknown  . Percocet [Oxycodone-Acetaminophen] Nausea And Vomiting  . Penicillins Rash and Other (See Comments)    Has patient had a PCN reaction causing immediate rash, facial/tongue/throat swelling, SOB or lightheadedness with hypotension: No Has patient had a PCN reaction causing severe rash involving mucus membranes  or skin necrosis: No Has patient had a PCN reaction that required hospitalization No Has patient had a PCN reaction occurring within the last 10 years: No If all of the above answers are "NO", then may proceed with Cephalosporin use.    Medications; Prior to Admission medications   Medication Sig Start Date End Date Taking? Authorizing Provider  aspirin EC 81 MG tablet Take 81 mg by mouth at bedtime.    Yes [provider]  Cholecalciferol (VITAMIN D3) 2000 units TABS Take 2,000 Units by mouth daily.    Yes [provider]  furosemide (LASIX) 20 MG tablet Take 1 tablet (20 mg total) daily by mouth. 06/25/17 09/23/17 Yes Allred, Jeneen Rinks, MD  Hydrocortisone (GERHARDT'S BUTT CREAM) CREA Apply 1 application topically 4 (four) times daily as needed for irritation. 05/10/17  Yes Sela Hilding, MD  Multiple Vitamins-Minerals (PRESERVISION AREDS PO) Take 1 tablet by mouth 2 (two) times daily.   Yes [provider]  saccharomyces boulardii (FLORASTOR) 250 MG capsule Take 1 capsule (250 mg total) by mouth 2 (two) times daily. 05/10/17  Yes Sela Hilding, MD  potassium chloride SA (K-DUR,KLOR-CON) 20 MEQ tablet Take 1 tablet (20 mEq total) by mouth daily. Patient not taking: Reported on 06/22/2017 05/24/17   Richardson Dopp T, PA-C  traZODone (DESYREL) 50 MG tablet Take 1 tablet (50 mg total) by mouth at bedtime as needed for sleep. Patient not taking: Reported on 08/18/2017 05/10/17   Sela Hilding, MD   . dronabinol  2.5 mg Oral BID AC  . heparin  5,000 Units Subcutaneous Q8H  . pantoprazole (PROTONIX) IV  40 mg Intravenous Q12H  . sucralfate  1 g Oral TID WC & HS   PRN Meds Gerhardt's butt cream, lip balm, morphine injection, prochlorperazine Results for orders placed or performed during the hospital encounter of 08/18/17 (from the past 48 hour(s))  CBC with Differential/Platelet     Status: Abnormal   Collection Time: 08/18/17  5:40 PM  Result Value Ref Range    WBC 7.6 4.0 - 10.5 K/uL   RBC 3.54 (L) 3.87 - 5.11 MIL/uL   Hemoglobin 9.7 (L) 12.0 - 15.0 g/dL   HCT 31.1 (L) 36.0 - 46.0 %   MCV 87.9 78.0 - 100.0 fL   MCH 27.4 26.0 - 34.0 pg   MCHC 31.2 30.0 - 36.0 g/dL   RDW 16.7 (H) 11.5 - 15.5 %   Platelets 370 150 - 400 K/uL   Neutrophils Relative % 62 %   Neutro Abs 4.8 1.7 - 7.7 K/uL   Lymphocytes Relative 23 %   Lymphs Abs 1.7 0.7 - 4.0 K/uL   Monocytes Relative 11 %   Monocytes Absolute 0.8 0.1 - 1.0 K/uL   Eosinophils Relative 4 %   Eosinophils Absolute 0.3 0.0 - 0.7 K/uL   Basophils Relative 0 %   Basophils Absolute 0.0 0.0 - 0.1 K/uL  Creatinine, serum     Status: None  Collection Time: 08/18/17  5:40 PM  Result Value Ref Range   Creatinine, Ser 0.53 0.44 - 1.00 mg/dL   GFR calc non Af Amer >60 >60 mL/min   GFR calc Af Amer >60 >60 mL/min    Comment: (NOTE) The eGFR has been calculated using the CKD EPI equation. This calculation has not been validated in all clinical situations. eGFR's persistently <60 mL/min signify possible Chronic Kidney Disease.   Comprehensive metabolic panel     Status: Abnormal   Collection Time: 08/19/17  4:21 AM  Result Value Ref Range   Sodium 136 135 - 145 mmol/L   Potassium 3.6 3.5 - 5.1 mmol/L   Chloride 106 101 - 111 mmol/L   CO2 25 22 - 32 mmol/L   Glucose, Bld 113 (H) 65 - 99 mg/dL   BUN 6 6 - 20 mg/dL   Creatinine, Ser 0.45 0.44 - 1.00 mg/dL   Calcium 8.3 (L) 8.9 - 10.3 mg/dL   Total Protein 5.7 (L) 6.5 - 8.1 g/dL   Albumin 2.5 (L) 3.5 - 5.0 g/dL   AST 14 (L) 15 - 41 U/L   ALT 12 (L) 14 - 54 U/L   Alkaline Phosphatase 85 38 - 126 U/L   Total Bilirubin 0.3 0.3 - 1.2 mg/dL   GFR calc non Af Amer >60 >60 mL/min   GFR calc Af Amer >60 >60 mL/min    Comment: (NOTE) The eGFR has been calculated using the CKD EPI equation. This calculation has not been validated in all clinical situations. eGFR's persistently <60 mL/min signify possible Chronic Kidney Disease.    Anion gap 5 5 - 15   CBC     Status: Abnormal   Collection Time: 08/19/17  4:21 AM  Result Value Ref Range   WBC 7.5 4.0 - 10.5 K/uL   RBC 3.31 (L) 3.87 - 5.11 MIL/uL   Hemoglobin 9.3 (L) 12.0 - 15.0 g/dL   HCT 29.1 (L) 36.0 - 46.0 %   MCV 87.9 78.0 - 100.0 fL   MCH 28.1 26.0 - 34.0 pg   MCHC 32.0 30.0 - 36.0 g/dL   RDW 16.8 (H) 11.5 - 15.5 %   Platelets 364 150 - 400 K/uL    Ct Abdomen Pelvis W Contrast  Result Date: 08/18/2017 CLINICAL DATA:  Colonoscopy earlier today with stricture 15 cm from the anus. Recent diagnosis and treatment for CMV colitis. EXAM: CT ABDOMEN AND PELVIS WITH CONTRAST TECHNIQUE: Multidetector CT imaging of the abdomen and pelvis was performed using the standard protocol following bolus administration of intravenous contrast. CONTRAST:  100 cc Isovue-300 IV COMPARISON:  Most recent CT 05/16/2017, additional priors FINDINGS: Lower chest: Bibasilar atelectasis, right greater than left. Resolved pleural effusions. Pacemaker is partially included. Hepatobiliary: No focal hepatic lesion. Postcholecystectomy. Prominent common bile duct measuring 12 mm, with mild central intrahepatic biliary ductal dilatation. No visualized choledocholithiasis. Pancreas: Parenchymal atrophy. No ductal dilatation or inflammation. Spleen: Normal in size without focal abnormality. Splenule at the hilum. Adrenals/Urinary Tract: No adrenal nodule. No hydronephrosis or perinephric edema. Homogeneous renal enhancement with symmetric excretion on delayed phase imaging. Urinary bladder is physiologically distended without wall thickening. Stomach/Bowel: Moderate length segment of colonic wall thickening in the mid sigmoid with pericolonic edema. Mild rectal wall thickening. Presacral edema appears similar to prior. Colonic wall thickening of the proximal descending colon with persistent but improved pericolonic inflammatory change. Moderate to large stool burden in the more proximal colon. Small volume of formed stool in the  distal descending  and proximal sigmoid colon with minimal diverticulosis. No diverticulitis, pneumatosis or perforation. Administered enteric contrast reaches the hepatic flexure. No small bowel inflammation or obstruction. Normal appendix. Stomach is nondistended. Vascular/Lymphatic: Aortic and branch atherosclerosis. Circumaortic left renal vein. Small retroperitoneal nodes. No definite pericolonic or pelvic adenopathy. Reproductive: Atrophic uterus. Low-density structure in the right adnexa measuring 2.4 cm, higher than simple fluid density, similar in size compared to prior exams. No air in the endometrial cavity, minimal air in the vagina. Other: No free air or ascites. Presacral edema appear similar to prior exam. Musculoskeletal: Posterior L2 through L5 fusion with posterior rods and intrapedicular screws. The right L2 pedicle screw extends lateral to the vertebral body, unchanged. No acute osseous abnormality. IMPRESSION: 1. Moderate length sigmoid colonic wall thickening and pericolonic edema, similar in distribution to prior exam, likely corresponding to reported stricture on colonoscopy. Additional rectal and proximal descending colonic wall thickening are also similar in distribution. CT findings suggest active colitis. 2. Moderate proximal stool burden. 3. Postcholecystectomy with mild biliary dilatation. Recommend correlation with LFTs. If normal LFTs, findings likely secondary to prior cholecystectomy and no further evaluation is needed. If elevated, consider further evaluation with ERCP or MRCP. 4.  Aortic Atherosclerosis (ICD10-I70.0). Electronically Signed   By: Jeb Levering M.D.   On: 08/18/2017 22:35              Blood pressure (!) 116/47, pulse (!) 102, temperature 99 F (37.2 C), temperature source Oral, resp. rate 20, height 5' 4"  (1.626 m), weight 75 kg (165 lb 5.5 oz), SpO2 96 %.  Physical exam:   General--Pleasant white female in no  distress ENT--nonicteric Neck--supple no lymphadenopathy Heart--regular rate and rhythm without murmurs or gallops Lungs--clear Abdomen--nondistended soft and nontender bowel sounds normal Psych--mood and affect appropriate answers questions appropriately   Assessment: 1.  Active colitis in the rectosigmoid area now with nearly obstructing stricture.  Has had documented CMV inclusions in the past but has not responded to treatment.  Feel that it is important we get further biopsies to see if CMV is still active.  Plan: 1.  We will allow a small amount of clear liquids and ice chips. 2.  We will prep with tap water enemas and attempt sigmoidoscopy and biopsy tomorrow with the pediatric endoscope.  If we are unable to get through with this scope she will likely need a diverting colostomy.   Nancy Fetter 08/19/2017, 2:22 PM   This note was created using voice recognition software and minor errors may Have occurred unintentionally. Pager: [phone] If no answer or after hours call [phone]

## 2017-08-19 NOTE — Progress Notes (Signed)
    CC: CMV colitis  Subjective: Patient sitting up in bed, taking ice chips.  Having multiple loose bowel movements.  This is currently her norm.  No abdominal pain, no abdominal distention.  Objective: Vital signs in last 24 hours: Temp:  [98.3 F (36.8 C)-99 F (37.2 C)] 99 F (37.2 C) (01/03 0625) Pulse Rate:  [92-94] 92 (01/03 0625) Resp:  [18] 18 (01/03 0625) BP: (143)/(64-77) 143/64 (01/03 0625) SpO2:  [92 %-95 %] 92 % (01/03 0625) Weight:  [75 kg (165 lb 5.5 oz)] 75 kg (165 lb 5.5 oz) (01/02 2047) Last BM Date: 08/18/17 678 IV Voided x3 Stools x7 Afebrile vital signs are stable A.m. labs: Decreased albumin/protein CMP is otherwise normal WBC 7.5 H/H stable 9.3/29 CT of the abdomen 08/18/17: Bibasilar atelectasis left greater than right resolve pleural effusion permanent transvenous pacemaker.  Post cholecystectomy. Moderate length segment of colonic wall thickening in the mid sigmoid with pericolonic edema mild rectal wall thickening, presacral edema.  Colonic wall thickening of the proximal descending colon with persistent but improved pericolonic inflammation moderate to large stool burden in the proximal colon.  CT findings suggestive of active colitis with moderate stool burden  Intake/Output from previous day: 01/02 0701 - 01/03 0700 In: 678.8 [I.V.:678.8] Out: -  Intake/Output this shift: No intake/output data recorded.  General appearance: alert, cooperative and no distress GI: soft, non-tender; bowel sounds normal; no masses,  no organomegaly Continues to have multiple loose stools. Lab Results:  Recent Labs    08/18/17 1740 08/19/17 0421  WBC 7.6 7.5  HGB 9.7* 9.3*  HCT 31.1* 29.1*  PLT 370 364    BMET Recent Labs    08/18/17 1740 08/19/17 0421  NA  --  136  K  --  3.6  CL  --  106  CO2  --  25  GLUCOSE  --  113*  BUN  --  6  CREATININE 0.53 0.45  CALCIUM  --  8.3*   PT/INR No results for input(s): LABPROT, INR in the last 72  hours.  Recent Labs  Lab 08/19/17 0421  AST 14*  ALT 12*  ALKPHOS 85  BILITOT 0.3  PROT 5.7*  ALBUMIN 2.5*     Lipase     Component Value Date/Time   LIPASE 26 05/16/2017 1745     Medications: . dronabinol  2.5 mg Oral BID AC  . heparin  5,000 Units Subcutaneous Q8H  . pantoprazole (PROTONIX) IV  40 mg Intravenous Q12H  . sucralfate  1 g Oral TID WC & HS   . dextrose 5 % and 0.45 % NaCl with KCl 10 mEq/L 75 mL/hr at 08/18/17 2107   Anti-infectives (From admission, onward)   None     GI consult:   Dr. Matthias HughsBuccini ID:   Dr. Staci Righterobert Comer Assessment/Plan Sigmoid colonic stricture/partial obstruction Active -  ?CMV colitis Abdominal pain/weight loss  Hypertension History of CMV colitis. Permanent transvenous pacemaker -symptomatic secondary AV block/history of PAF Nonobstructive coronary artery disease.  FEN: IV fluids/n.p.o. ID: No antibiotics DVT: Heparin Foley: Follow-up: TBD    Plan: Medical management currently.  We will follow.    LOS: 1 day    Duane Trias 08/19/2017 [phone]

## 2017-08-19 NOTE — Progress Notes (Signed)
Initial Nutrition Assessment  INTERVENTION:   Diet advancement per MD Will discuss nutrition supplementation once diet is advanced   NUTRITION DIAGNOSIS:   Inadequate oral intake related to nausea, diarrhea as evidenced by NPO status.  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   Pt currently NPO for pending sigmoidoscopy and biopsy per GI note. Pt c/o nausea and diarrhea PTA.  Pt has lost 16 lb since 9/29 (9% wt loss x 3 months, significant for time frame).  Labs reviewed. Medications:  Marinol capsule BID, IV Protonix every 12 hours, Carafate suspension QID, D5 and .45% NaCl with KCl infusion at 75 ml/hr.  NUTRITION - FOCUSED PHYSICAL EXAM:  Nutrition focused physical exam shows no sign of depletion of muscle mass or body fat.  Diet Order:  Diet NPO time specified  EDUCATION NEEDS:   No education needs have been identified at this time  Skin:  Skin Assessment: Reviewed RN Assessment  Last BM:  1/2  Height:   Ht Readings from Last 1 Encounters:  08/18/17 5\' 4"  (1.626 m)    Weight:   Wt Readings from Last 1 Encounters:  08/18/17 165 lb 5.5 oz (75 kg)    Ideal Body Weight:  54.5 kg  BMI:  Body mass index is 28.38 kg/m.  Estimated Nutritional Needs:   Kcal:  1700-1900  Protein:  70-80g  Fluid:  1.9L/day  Tilda FrancoLindsey Grant Swager, MS, RD, LDN [facility] Clinical Dietitian Pager: [phone] After Hours Pager:  [phone]

## 2017-08-19 NOTE — Progress Notes (Signed)
Patient ID: Sharon Gilmore, female   DOB: 09-Nov-1933, 83 y.o.   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  General surgery was consulted   Assessment & Plan:   Active Problems:   Abdominal pain   Essential hypertension   History of CMV colitis (HCC)   Cardiac pacemaker in situ   Weight loss, non-intentional   Colonic stricture (HCC)   Sigmoid Colonic stricture with partial colonic obstruction -General surgery following.  Currently being medically managed.  GI consultation is pending. -Continue IV fluids and n.p.o.  History of recent CMV colitis -Spoke to Dr. Bertram Millard diseases and she will see the patient consultation.  General surgery recommended ID evaluation.  Hypertension -Monitor blood pressure.  Lasix on hold.  History of second-degree AV block status post pacemaker placement  -outpatient follow-up with electrophysiology  Chronic diastolic dysfunction -Outpatient follow-up with cardiology.  Lasix on hold for now    DVT prophylaxis: Lovenox Code Status: Full Family Communication: None at bedside Disposition Plan: Depends on clinical outcome  Consultants: General surgery, GI, ID  Procedures: None  Antimicrobials: None   Subjective: Patient seen and examined at bedside.  She complains of abdominal pressure and feels that everything is blocked in her belly.  Currently nauseous but no active vomiting.  No overnight fever.  No bowel movements  overnight.  Objective: Vitals:   08/18/17 2045 08/18/17 2047 08/18/17 2200 08/19/17 0625  BP:   (!) 143/77 (!) 143/64  Pulse:   94 92  Resp:   18 18  Temp:   98.3 F (36.8 C) 99 F (37.2 C)  TempSrc:   Oral Oral  SpO2:   95% 92%  Weight:  75 kg (165 lb 5.5 oz)    Height: 5\' 4"  (1.626 m)       Intake/Output Summary (Last 24 hours) at 08/19/2017 1035 Last data filed at 08/19/2017 0600 Gross per 24 hour  Intake 678.75 ml  Output -  Net 678.75 ml   Filed Weights   08/18/17 2047  Weight: 75 kg (165 lb 5.5 oz)    Examination:  General exam: Appears calm and comfortable  Respiratory system: Bilateral decreased breath sound at bases Cardiovascular system: S1 & S2 heard, rate controlled  gastrointestinal system: Abdomen is distended, soft and nontender.  Bowel sounds sluggish Extremities: No cyanosis, clubbing, edema    Data Reviewed: I have personally reviewed following labs and imaging studies  CBC: Recent Labs  Lab 08/18/17 1740 08/19/17 0421  WBC 7.6 7.5  NEUTROABS 4.8  --   HGB 9.7* 9.3*  HCT 31.1* 29.1*  MCV 87.9 87.9  PLT 370 364   Basic Metabolic Panel: Recent Labs  Lab 08/18/17 1740 08/19/17 0421  NA  --  136  K  --  3.6  CL  --  106  CO2  --  25  GLUCOSE  --  113*  BUN  --  6  CREATININE 0.53 0.45  CALCIUM  --  8.3*   GFR: Estimated Creatinine Clearance: 54.7 mL/min (by C-G formula based on SCr of 0.45 mg/dL). Liver Function  Tests: Recent Labs  Lab 08/19/17 0421  AST 14*  ALT 12*  ALKPHOS 85  BILITOT 0.3  PROT 5.7*  ALBUMIN 2.5*   No results for input(s): LIPASE, AMYLASE in the last 168 hours. No results for input(s): AMMONIA in the last 168 hours. Coagulation Profile: No results for input(s): INR, PROTIME in the last 168 hours. Cardiac Enzymes: No results for input(s): CKTOTAL, CKMB, CKMBINDEX, TROPONINI in the last 168 hours. BNP (last 3 results) Recent Labs    05/24/17 1150  PROBNP 397   HbA1C: No results for input(s): HGBA1C  in the last 72 hours. CBG: No results for input(s): GLUCAP in the last 168 hours. Lipid Profile: No results for input(s): CHOL, HDL, LDLCALC, TRIG, CHOLHDL, LDLDIRECT in the last 72 hours. Thyroid Function Tests: No results for input(s): TSH, T4TOTAL, FREET4, T3FREE, THYROIDAB in the last 72 hours. Anemia Panel: No results for input(s): VITAMINB12, FOLATE, FERRITIN, TIBC, IRON, RETICCTPCT in the last 72 hours. Sepsis Labs: No results for input(s): PROCALCITON, LATICACIDVEN in the last 168 hours.  No results found for this or any previous visit (from the past 240 hour(s)).       Radiology Studies: Ct Abdomen Pelvis W Contrast  Result Date: 08/18/2017 CLINICAL DATA:  Colonoscopy earlier today with stricture 15 cm from the anus. Recent diagnosis and treatment for CMV colitis. EXAM: CT ABDOMEN AND PELVIS WITH CONTRAST TECHNIQUE: Multidetector CT imaging of the abdomen and pelvis was performed using the standard protocol following bolus administration of intravenous contrast. CONTRAST:  100 cc Isovue-300 IV COMPARISON:  Most recent CT 05/16/2017, additional priors FINDINGS: Lower chest: Bibasilar atelectasis, right greater than left. Resolved pleural effusions. Pacemaker is partially included. Hepatobiliary: No focal hepatic lesion. Postcholecystectomy. Prominent common bile duct measuring 12 mm, with mild central intrahepatic biliary ductal dilatation. No visualized choledocholithiasis. Pancreas: Parenchymal atrophy. No ductal dilatation or inflammation. Spleen: Normal in size without focal abnormality. Splenule at the hilum. Adrenals/Urinary Tract: No adrenal nodule. No hydronephrosis or perinephric edema. Homogeneous renal enhancement with symmetric excretion on delayed phase imaging. Urinary bladder is physiologically distended without wall thickening. Stomach/Bowel: Moderate length segment of colonic wall thickening in the mid sigmoid with pericolonic edema. Mild rectal wall thickening. Presacral  edema appears similar to prior. Colonic wall thickening of the proximal descending colon with persistent but improved pericolonic inflammatory change. Moderate to large stool burden in the more proximal colon. Small volume of formed stool in the distal descending and proximal sigmoid colon with minimal diverticulosis. No diverticulitis, pneumatosis or perforation. Administered enteric contrast reaches the hepatic flexure. No small bowel inflammation or obstruction. Normal appendix. Stomach is nondistended. Vascular/Lymphatic: Aortic and branch atherosclerosis. Circumaortic left renal vein. Small retroperitoneal nodes. No definite pericolonic or pelvic adenopathy. Reproductive: Atrophic uterus. Low-density structure in the right adnexa measuring 2.4 cm, higher than simple fluid density, similar in size compared to prior exams. No air in the endometrial cavity, minimal air in the vagina. Other: No free air or ascites. Presacral edema appear similar to prior exam. Musculoskeletal: Posterior L2 through L5 fusion with posterior rods and intrapedicular screws. The right L2 pedicle screw extends lateral to the vertebral body, unchanged. No acute osseous abnormality. IMPRESSION: 1. Moderate length sigmoid colonic wall thickening and pericolonic edema, similar in distribution to prior exam, likely corresponding to reported stricture on colonoscopy. Additional rectal and proximal descending colonic wall thickening are also similar in distribution. CT findings suggest active colitis. 2. Moderate proximal stool burden. 3. Postcholecystectomy with mild biliary dilatation. Recommend correlation with LFTs.  If normal LFTs, findings likely secondary to prior cholecystectomy and no further evaluation is needed. If elevated, consider further evaluation with ERCP or MRCP. 4.  Aortic Atherosclerosis (ICD10-I70.0). Electronically Signed   By: Rubye Oaks M.D.   On: 08/18/2017 22:35        Scheduled Meds: . dronabinol  2.5 mg  Oral BID AC  . heparin  5,000 Units Subcutaneous Q8H  . pantoprazole (PROTONIX) IV  40 mg Intravenous Q12H  . sucralfate  1 g Oral TID WC & HS   Continuous Infusions: . dextrose 5 % and 0.45 % NaCl with KCl 10 mEq/L 75 mL/hr at 08/18/17 2107     LOS: 1 day        Glade Lloyd, MD [facility] Pager [phone]  If 7PM-7AM, please contact night-coverage [email or web address] Password [email or web address] 08/19/2017, 10:35 AM

## 2017-08-19 NOTE — Consult Note (Signed)
[facility] for Infectious Disease   Reason for Consult: diarrhea   Referring Physician: [email or web address]  Active Problems:   Abdominal pain   Essential hypertension   History of CMV colitis ([place])   Cardiac pacemaker in situ   Weight loss, non-intentional   Colonic stricture (HCC)    HPI: Sharon Gilmore is a 84 y.o. female with no past history of immune suppression started to have chronic watery diarrhea, unintentional weight loss of 20 lb, in Fall 2018, started in mid September and was diagnosed with CMV colitis by colonoscopy on 10/19. She was referred to the ID clinic and started on ganciclovir on 10/25. She was started initially on 900 mg twice a day and reduced to 450 mg twice a day due to BM suppression on 11/6 and finished valcyte on 11/24 - 4 wk course of tx. She saw Dr Linus Salmons on 11/19 who felt she had some improvement in her symptoms and felt no need for further treatment but rather monitoring off of antivirals. She had semi-form stools but still having multiple small bowel movements throughout the day. She reports them being increasing liquid and small caliber. Denies blood in her stool.Denies fever, chills, nightsweats. Still ongoing fatigue, and had developed a cough that she attributes to valcyte. She had repeat EGD and CSY on 1/2. She was found to have a new colonic stricture, roughly 20 cm from anal verge where Dr Harlene Ramus unable to pass ultrathin scope. Thus, patient admitted for further management. Her repeat ABD CT shows ongoing colitis similar to her prior study on 9/30. Patient has also been seen by the surgery team to decide how to manage her colonic stricture to where she will need surgical resection.    Past Medical History:  Diagnosis Date  . Abnormal barium swallow 09/1999  . Arthritis   . Asthma   . Carpal tunnel syndrome   . CMV colitis ([place])   . Depression   . Erythema nodosum   . H/O Doppler ultrasound    LEFT LEG 11/1997  . Hypertension   . Psoriasis   .  Rosacea   . Seizures ([place]) 1980  . Swallowing disorder     Allergies:  Allergies  Allergen Reactions  . Celebrex [Celecoxib] Other (See Comments)    LEG ACHING   . Codeine Nausea And Vomiting  . Cymbalta [Duloxetine Hcl] Other (See Comments)    Sensitive per MAR  . Doxycycline Swelling and Other (See Comments)    Facial swelling  . Meloxicam Other (See Comments)    TUMMY ACHE, INEFFECTIVE  . Nortriptyline Other (See Comments)    unknown  . Percocet [Oxycodone-Acetaminophen] Nausea And Vomiting  . Penicillins Rash and Other (See Comments)    Has patient had a PCN reaction causing immediate rash, facial/tongue/throat swelling, SOB or lightheadedness with hypotension: No Has patient had a PCN reaction causing severe rash involving mucus membranes or skin necrosis: No Has patient had a PCN reaction that required hospitalization No Has patient had a PCN reaction occurring within the last 10 years: No If all of the above answers are "NO", then may proceed with Cephalosporin use.   MEDICATIONS: . dronabinol  2.5 mg Oral BID AC  . heparin  5,000 Units Subcutaneous Q8H  . pantoprazole (PROTONIX) IV  40 mg Intravenous Q12H  . sucralfate  1 g Oral TID WC & HS    Social History   Tobacco Use  . Smoking status: Former Smoker    Last attempt to quit:  1982    Years since quitting: 37.0  . Smokeless tobacco: Never Used  Substance Use Topics  . Alcohol use: Yes    Comment: 1-2 times per week  . Drug use: No    Family History  Problem Relation Age of Onset  . CVA Mother   . Stroke Mother   . Parkinson's disease Father   . Leukemia Brother   . CVA Maternal Grandmother   . Stroke Maternal Grandmother   . Peripheral vascular disease Maternal Grandfather   . Breast cancer Paternal Grandmother   . Diabetes Paternal Grandmother   . CVA Paternal Grandfather   . Stroke Paternal Grandfather   . Pancreatic cancer Paternal Aunt   . Stroke Paternal Aunt   . Lung cancer Paternal Uncle    . Pancreatic cancer Brother   . Coronary artery disease Brother      Review of Systems  Constitutional: Negative for fever, chills, diaphoresis, activity change, appetite change, fatigue and unexpected weight change.  HENT: Negative for congestion, sore throat, rhinorrhea, sneezing, trouble swallowing and sinus pressure.  Eyes: Negative for photophobia and visual disturbance.  Respiratory: Negative for cough, chest tightness, shortness of breath, wheezing and stridor.  Cardiovascular: Negative for chest pain, palpitations and leg swelling.  Gastrointestinal: +abdominal discomfort, diarrhea, nonbloody  Genitourinary: Negative for dysuria, hematuria, flank pain and difficulty urinating.  Musculoskeletal: left sided lower thoracic rib pain Skin: Negative for color change, pallor, rash and wound.  Neurological: Negative for dizziness, tremors, weakness and light-headedness.  Hematological: Negative for adenopathy. Does not bruise/bleed easily.  Psychiatric/Behavioral: Negative for behavioral problems, confusion, sleep disturbance, dysphoric mood, decreased concentration and agitation.     OBJECTIVE: Temp:  [98.3 F (36.8 C)-99 F (37.2 C)] 99 F (37.2 C) (01/03 0625) Pulse Rate:  [92-102] 102 (01/03 1347) Resp:  [18-20] 20 (01/03 1347) BP: (116-143)/(47-77) 116/47 (01/03 1347) SpO2:  [92 %-96 %] 96 % (01/03 1347) Weight:  [165 lb 5.5 oz (75 kg)] 165 lb 5.5 oz (75 kg) (01/02 2047)  Constitutional:  oriented to person, place, and time. appears well-developed and well-nourished. No distress.  HENT: /AT, PERRLA, no scleral icterus Mouth/Throat: Oropharynx is clear and moist. No oropharyngeal exudate.  Cardiovascular: Normal rate, regular rhythm and normal heart sounds. Exam reveals no gallop and no friction rub.  No murmur heard.  Pulmonary/Chest: Effort normal and breath sounds normal. No respiratory distress.  has no wheezes.  Neck = supple, no nuchal rigidity Abdominal: Soft.  Bowel sounds are normal.  exhibits no distension. There is no tenderness.  Lymphadenopathy: no cervical adenopathy. No axillary adenopathy Neurological: alert and oriented to person, place, and time.  Skin: Skin is warm and dry. No rash noted. No erythema.  Psychiatric: a normal mood and affect.  behavior is normal.    LABS: Results for orders placed or performed during the hospital encounter of 08/18/17 (from the past 48 hour(s))  CBC with Differential/Platelet     Status: Abnormal   Collection Time: 08/18/17  5:40 PM  Result Value Ref Range   WBC 7.6 4.0 - 10.5 K/uL   RBC 3.54 (L) 3.87 - 5.11 MIL/uL   Hemoglobin 9.7 (L) 12.0 - 15.0 g/dL   HCT 31.1 (L) 36.0 - 46.0 %   MCV 87.9 78.0 - 100.0 fL   MCH 27.4 26.0 - 34.0 pg   MCHC 31.2 30.0 - 36.0 g/dL   RDW 16.7 (H) 11.5 - 15.5 %   Platelets 370 150 - 400 K/uL   Neutrophils Relative %  62 %   Neutro Abs 4.8 1.7 - 7.7 K/uL   Lymphocytes Relative 23 %   Lymphs Abs 1.7 0.7 - 4.0 K/uL   Monocytes Relative 11 %   Monocytes Absolute 0.8 0.1 - 1.0 K/uL   Eosinophils Relative 4 %   Eosinophils Absolute 0.3 0.0 - 0.7 K/uL   Basophils Relative 0 %   Basophils Absolute 0.0 0.0 - 0.1 K/uL  Creatinine, serum     Status: None   Collection Time: 08/18/17  5:40 PM  Result Value Ref Range   Creatinine, Ser 0.53 0.44 - 1.00 mg/dL   GFR calc non Af Amer >60 >60 mL/min   GFR calc Af Amer >60 >60 mL/min    Comment: (NOTE) The eGFR has been calculated using the CKD EPI equation. This calculation has not been validated in all clinical situations. eGFR's persistently <60 mL/min signify possible Chronic Kidney Disease.   Comprehensive metabolic panel     Status: Abnormal   Collection Time: 08/19/17  4:21 AM  Result Value Ref Range   Sodium 136 135 - 145 mmol/L   Potassium 3.6 3.5 - 5.1 mmol/L   Chloride 106 101 - 111 mmol/L   CO2 25 22 - 32 mmol/L   Glucose, Bld 113 (H) 65 - 99 mg/dL   BUN 6 6 - 20 mg/dL   Creatinine, Ser 0.45 0.44 - 1.00 mg/dL    Calcium 8.3 (L) 8.9 - 10.3 mg/dL   Total Protein 5.7 (L) 6.5 - 8.1 g/dL   Albumin 2.5 (L) 3.5 - 5.0 g/dL   AST 14 (L) 15 - 41 U/L   ALT 12 (L) 14 - 54 U/L   Alkaline Phosphatase 85 38 - 126 U/L   Total Bilirubin 0.3 0.3 - 1.2 mg/dL   GFR calc non Af Amer >60 >60 mL/min   GFR calc Af Amer >60 >60 mL/min    Comment: (NOTE) The eGFR has been calculated using the CKD EPI equation. This calculation has not been validated in all clinical situations. eGFR's persistently <60 mL/min signify possible Chronic Kidney Disease.    Anion gap 5 5 - 15  CBC     Status: Abnormal   Collection Time: 08/19/17  4:21 AM  Result Value Ref Range   WBC 7.5 4.0 - 10.5 K/uL   RBC 3.31 (L) 3.87 - 5.11 MIL/uL   Hemoglobin 9.3 (L) 12.0 - 15.0 g/dL   HCT 29.1 (L) 36.0 - 46.0 %   MCV 87.9 78.0 - 100.0 fL   MCH 28.1 26.0 - 34.0 pg   MCHC 32.0 30.0 - 36.0 g/dL   RDW 16.8 (H) 11.5 - 15.5 %   Platelets 364 150 - 400 K/uL   IMAGING: Ct Abdomen Pelvis W Contrast  Result Date: 08/18/2017 CLINICAL DATA:  Colonoscopy earlier today with stricture 15 cm from the anus. Recent diagnosis and treatment for CMV colitis. EXAM: CT ABDOMEN AND PELVIS WITH CONTRAST TECHNIQUE: Multidetector CT imaging of the abdomen and pelvis was performed using the standard protocol following bolus administration of intravenous contrast. CONTRAST:  100 cc Isovue-300 IV COMPARISON:  Most recent CT 05/16/2017, additional priors FINDINGS: Lower chest: Bibasilar atelectasis, right greater than left. Resolved pleural effusions. Pacemaker is partially included. Hepatobiliary: No focal hepatic lesion. Postcholecystectomy. Prominent common bile duct measuring 12 mm, with mild central intrahepatic biliary ductal dilatation. No visualized choledocholithiasis. Pancreas: Parenchymal atrophy. No ductal dilatation or inflammation. Spleen: Normal in size without focal abnormality. Splenule at the hilum. Adrenals/Urinary Tract: No adrenal nodule. No  hydronephrosis  or perinephric edema. Homogeneous renal enhancement with symmetric excretion on delayed phase imaging. Urinary bladder is physiologically distended without wall thickening. Stomach/Bowel: Moderate length segment of colonic wall thickening in the mid sigmoid with pericolonic edema. Mild rectal wall thickening. Presacral edema appears similar to prior. Colonic wall thickening of the proximal descending colon with persistent but improved pericolonic inflammatory change. Moderate to large stool burden in the more proximal colon. Small volume of formed stool in the distal descending and proximal sigmoid colon with minimal diverticulosis. No diverticulitis, pneumatosis or perforation. Administered enteric contrast reaches the hepatic flexure. No small bowel inflammation or obstruction. Normal appendix. Stomach is nondistended. Vascular/Lymphatic: Aortic and branch atherosclerosis. Circumaortic left renal vein. Small retroperitoneal nodes. No definite pericolonic or pelvic adenopathy. Reproductive: Atrophic uterus. Low-density structure in the right adnexa measuring 2.4 cm, higher than simple fluid density, similar in size compared to prior exams. No air in the endometrial cavity, minimal air in the vagina. Other: No free air or ascites. Presacral edema appear similar to prior exam. Musculoskeletal: Posterior L2 through L5 fusion with posterior rods and intrapedicular screws. The right L2 pedicle screw extends lateral to the vertebral body, unchanged. No acute osseous abnormality. IMPRESSION: 1. Moderate length sigmoid colonic wall thickening and pericolonic edema, similar in distribution to prior exam, likely corresponding to reported stricture on colonoscopy. Additional rectal and proximal descending colonic wall thickening are also similar in distribution. CT findings suggest active colitis. 2. Moderate proximal stool burden. 3. Postcholecystectomy with mild biliary dilatation. Recommend correlation with LFTs. If normal  LFTs, findings likely secondary to prior cholecystectomy and no further evaluation is needed. If elevated, consider further evaluation with ERCP or MRCP. 4.  Aortic Atherosclerosis (ICD10-I70.0). Electronically Signed   By: Jeb Levering M.D.   On: 08/18/2017 22:35   Assessment/Plan:  84yo F with CMV colitis,treated with 4 wk of valganciclovir but finished on 11/19 and now having ongoing diarrhea, inflammation on CT, and newly diagnosed colitis stricture. Unclear if this is failure of oral valcyte,? Possible malabsorption. She doesn't necessarily subscribe to acute worsening of diarrhea since stopping antivirals but has ongoing diarrhea. CT still shows impressive inflammation  - recommend to restart ganciclovir IV for now. Try to re-induction with 2 wk IV therapy, will dose adjust based on renal clearance - will check CMV viral load to see if it detectable as possible way to track response - will check fecal occult, blood often seen in CMV colitis - will watch for side effects of BM suppression while on IV therapy  Colonic stricture = not sure if anti-viral therapy alone will have impact on colonic stricture. She still may need intervention either stent vs resection. Defer to general surgery and GI.  Rib pain = recommend cxr

## 2017-08-19 NOTE — H&P (View-Only) (Signed)
[facility] CON[facility] Reason for consult: Rectal/sigmoid obstruction Referring Physician: [facility] hospitalist.  PCP: Dr. Olin Hauser.  Primary GI: Dr. Horris Latino is an 84 y.o. female.  HPI: Patient with a very interesting history.  She has had ulceration and inflammation in her rectosigmoid area for some time.I performed sigmoidoscopy 10/1 with marked ulceration and granulation tissue felt to be ischemic.  Biopsies at that time did not show any sign of CMV.  Several weeks later she continued to have symptoms and colonoscopy by Dr. Cristina Gong revealed marked inflammation at the rectosigmoid area with biopsies revealing inclusions for CMV.She was seen by ID and started on ganciclovir.  Unfortunately she continued to have chronic diarrhea and weight loss saw Dr. Cristina Gong in the office yesterday and sigmoidoscopy with the ultrathin scope showed marked narrowing and stricturing of the rectosigmoid area at approximately 20 cm from the anal verge and this area was unable to be passed with the ultrathin scope.  Patient was admitted.  She is n.p.o.CT yesterday showed marked colonic wall thickening of the sigmoid and distal descending colon consistent with colitis.  Similar to prior CT scans.  Has been seen by surgery and we are waiting for infectious disease to see again to see if further treatment is needed.  Past Medical History:  Diagnosis Date  . Abnormal barium swallow 09/1999  . Arthritis   . Asthma   . Carpal tunnel syndrome   . 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 Depression   . Erythema nodosum   . H/O Doppler ultrasound    LEFT LEG 11/1997  . Hypertension   . Psoriasis   . Rosacea   . Seizures ([place]) 1980  . Swallowing disorder     Past Surgical History:  Procedure Laterality Date  . BILATERAL CARPAL TUNNEL RELEASE    . CHOLECYSTECTOMY    . COLONOSCOPY  2004  . FLEXIBLE SIGMOIDOSCOPY N/A 05/17/2017   Procedure: FLEXIBLE SIGMOIDOSCOPY;  Surgeon: Laurence Spates, MD;  Location: [facility];  Service: Endoscopy;  Laterality: N/A;  . INCISIONAL HERNIA REPAIR    . LEFT HEART CATH AND CORONARY ANGIOGRAPHY N/A 03/15/2017   Procedure: Left Heart Cath and Coronary Angiography;  Surgeon: Lorretta Harp, MD;  Location: [facility];  Service: Cardiovascular;  Laterality: N/A;  . PACEMAKER IMPLANT N/A 03/09/2017   Medtronic Azure XT MRI conditional dual-chamber pacemaker for symptomatic mobitz II second degree AV block by Dr Rayann Heman  . TONSILLECTOMY      Family History  Problem Relation Age of Onset  . CVA Mother   . Stroke Mother   . Parkinson's disease Father   . Leukemia Brother   . CVA Maternal Grandmother   . Stroke Maternal Grandmother   . Peripheral vascular disease Maternal Grandfather   . Breast cancer Paternal Grandmother   . Diabetes Paternal Grandmother   . CVA Paternal Grandfather   . Stroke Paternal Grandfather   . Pancreatic cancer Paternal Aunt   . Stroke Paternal Aunt   . Lung cancer Paternal Uncle   . Pancreatic cancer Brother   . Coronary artery disease Brother     Social History:  reports that she quit smoking about 37 years ago. she has never used smokeless tobacco. She reports that she drinks alcohol. She reports that she does not use drugs.  Allergies:  Allergies  Allergen Reactions  . Celebrex [Celecoxib] Other (See Comments)    LEG ACHING   . Codeine Nausea And Vomiting  . Cymbalta [Duloxetine Hcl]  Other (See Comments)    Sensitive per MAR  . Doxycycline Swelling and Other (See Comments)    Facial swelling  . Meloxicam Other (See Comments)    TUMMY ACHE, INEFFECTIVE  . Nortriptyline Other (See Comments)    unknown  . Percocet [Oxycodone-Acetaminophen] Nausea And Vomiting  . Penicillins Rash and Other (See Comments)    Has patient had a PCN reaction causing immediate rash, facial/tongue/throat swelling, SOB or lightheadedness with hypotension: No Has patient had a PCN reaction causing severe rash involving mucus membranes  or skin necrosis: No Has patient had a PCN reaction that required hospitalization No Has patient had a PCN reaction occurring within the last 10 years: No If all of the above answers are "NO", then may proceed with Cephalosporin use.    Medications; Prior to Admission medications   Medication Sig Start Date End Date Taking? Authorizing Provider  aspirin EC 81 MG tablet Take 81 mg by mouth at bedtime.    Yes [provider]  Cholecalciferol (VITAMIN D3) 2000 units TABS Take 2,000 Units by mouth daily.    Yes [provider]  furosemide (LASIX) 20 MG tablet Take 1 tablet (20 mg total) daily by mouth. 06/25/17 09/23/17 Yes Allred, Jeneen Rinks, MD  Hydrocortisone (GERHARDT'S BUTT CREAM) CREA Apply 1 application topically 4 (four) times daily as needed for irritation. 05/10/17  Yes Sela Hilding, MD  Multiple Vitamins-Minerals (PRESERVISION AREDS PO) Take 1 tablet by mouth 2 (two) times daily.   Yes [provider]  saccharomyces boulardii (FLORASTOR) 250 MG capsule Take 1 capsule (250 mg total) by mouth 2 (two) times daily. 05/10/17  Yes Sela Hilding, MD  potassium chloride SA (K-DUR,KLOR-CON) 20 MEQ tablet Take 1 tablet (20 mEq total) by mouth daily. Patient not taking: Reported on 06/22/2017 05/24/17   Richardson Dopp T, PA-C  traZODone (DESYREL) 50 MG tablet Take 1 tablet (50 mg total) by mouth at bedtime as needed for sleep. Patient not taking: Reported on 08/18/2017 05/10/17   Sela Hilding, MD   . dronabinol  2.5 mg Oral BID AC  . heparin  5,000 Units Subcutaneous Q8H  . pantoprazole (PROTONIX) IV  40 mg Intravenous Q12H  . sucralfate  1 g Oral TID WC & HS   PRN Meds Gerhardt's butt cream, lip balm, morphine injection, prochlorperazine Results for orders placed or performed during the hospital encounter of 08/18/17 (from the past 48 hour(s))  CBC with Differential/Platelet     Status: Abnormal   Collection Time: 08/18/17  5:40 PM  Result Value Ref Range    WBC 7.6 4.0 - 10.5 K/uL   RBC 3.54 (L) 3.87 - 5.11 MIL/uL   Hemoglobin 9.7 (L) 12.0 - 15.0 g/dL   HCT 31.1 (L) 36.0 - 46.0 %   MCV 87.9 78.0 - 100.0 fL   MCH 27.4 26.0 - 34.0 pg   MCHC 31.2 30.0 - 36.0 g/dL   RDW 16.7 (H) 11.5 - 15.5 %   Platelets 370 150 - 400 K/uL   Neutrophils Relative % 62 %   Neutro Abs 4.8 1.7 - 7.7 K/uL   Lymphocytes Relative 23 %   Lymphs Abs 1.7 0.7 - 4.0 K/uL   Monocytes Relative 11 %   Monocytes Absolute 0.8 0.1 - 1.0 K/uL   Eosinophils Relative 4 %   Eosinophils Absolute 0.3 0.0 - 0.7 K/uL   Basophils Relative 0 %   Basophils Absolute 0.0 0.0 - 0.1 K/uL  Creatinine, serum     Status: None  Collection Time: 08/18/17  5:40 PM  Result Value Ref Range   Creatinine, Ser 0.53 0.44 - 1.00 mg/dL   GFR calc non Af Amer >60 >60 mL/min   GFR calc Af Amer >60 >60 mL/min    Comment: (NOTE) The eGFR has been calculated using the CKD EPI equation. This calculation has not been validated in all clinical situations. eGFR's persistently <60 mL/min signify possible Chronic Kidney Disease.   Comprehensive metabolic panel     Status: Abnormal   Collection Time: 08/19/17  4:21 AM  Result Value Ref Range   Sodium 136 135 - 145 mmol/L   Potassium 3.6 3.5 - 5.1 mmol/L   Chloride 106 101 - 111 mmol/L   CO2 25 22 - 32 mmol/L   Glucose, Bld 113 (H) 65 - 99 mg/dL   BUN 6 6 - 20 mg/dL   Creatinine, Ser 0.45 0.44 - 1.00 mg/dL   Calcium 8.3 (L) 8.9 - 10.3 mg/dL   Total Protein 5.7 (L) 6.5 - 8.1 g/dL   Albumin 2.5 (L) 3.5 - 5.0 g/dL   AST 14 (L) 15 - 41 U/L   ALT 12 (L) 14 - 54 U/L   Alkaline Phosphatase 85 38 - 126 U/L   Total Bilirubin 0.3 0.3 - 1.2 mg/dL   GFR calc non Af Amer >60 >60 mL/min   GFR calc Af Amer >60 >60 mL/min    Comment: (NOTE) The eGFR has been calculated using the CKD EPI equation. This calculation has not been validated in all clinical situations. eGFR's persistently <60 mL/min signify possible Chronic Kidney Disease.    Anion gap 5 5 - 15   CBC     Status: Abnormal   Collection Time: 08/19/17  4:21 AM  Result Value Ref Range   WBC 7.5 4.0 - 10.5 K/uL   RBC 3.31 (L) 3.87 - 5.11 MIL/uL   Hemoglobin 9.3 (L) 12.0 - 15.0 g/dL   HCT 29.1 (L) 36.0 - 46.0 %   MCV 87.9 78.0 - 100.0 fL   MCH 28.1 26.0 - 34.0 pg   MCHC 32.0 30.0 - 36.0 g/dL   RDW 16.8 (H) 11.5 - 15.5 %   Platelets 364 150 - 400 K/uL    Ct Abdomen Pelvis W Contrast  Result Date: 08/18/2017 CLINICAL DATA:  Colonoscopy earlier today with stricture 15 cm from the anus. Recent diagnosis and treatment for CMV colitis. EXAM: CT ABDOMEN AND PELVIS WITH CONTRAST TECHNIQUE: Multidetector CT imaging of the abdomen and pelvis was performed using the standard protocol following bolus administration of intravenous contrast. CONTRAST:  100 cc Isovue-300 IV COMPARISON:  Most recent CT 05/16/2017, additional priors FINDINGS: Lower chest: Bibasilar atelectasis, right greater than left. Resolved pleural effusions. Pacemaker is partially included. Hepatobiliary: No focal hepatic lesion. Postcholecystectomy. Prominent common bile duct measuring 12 mm, with mild central intrahepatic biliary ductal dilatation. No visualized choledocholithiasis. Pancreas: Parenchymal atrophy. No ductal dilatation or inflammation. Spleen: Normal in size without focal abnormality. Splenule at the hilum. Adrenals/Urinary Tract: No adrenal nodule. No hydronephrosis or perinephric edema. Homogeneous renal enhancement with symmetric excretion on delayed phase imaging. Urinary bladder is physiologically distended without wall thickening. Stomach/Bowel: Moderate length segment of colonic wall thickening in the mid sigmoid with pericolonic edema. Mild rectal wall thickening. Presacral edema appears similar to prior. Colonic wall thickening of the proximal descending colon with persistent but improved pericolonic inflammatory change. Moderate to large stool burden in the more proximal colon. Small volume of formed stool in the  distal descending  and proximal sigmoid colon with minimal diverticulosis. No diverticulitis, pneumatosis or perforation. Administered enteric contrast reaches the hepatic flexure. No small bowel inflammation or obstruction. Normal appendix. Stomach is nondistended. Vascular/Lymphatic: Aortic and branch atherosclerosis. Circumaortic left renal vein. Small retroperitoneal nodes. No definite pericolonic or pelvic adenopathy. Reproductive: Atrophic uterus. Low-density structure in the right adnexa measuring 2.4 cm, higher than simple fluid density, similar in size compared to prior exams. No air in the endometrial cavity, minimal air in the vagina. Other: No free air or ascites. Presacral edema appear similar to prior exam. Musculoskeletal: Posterior L2 through L5 fusion with posterior rods and intrapedicular screws. The right L2 pedicle screw extends lateral to the vertebral body, unchanged. No acute osseous abnormality. IMPRESSION: 1. Moderate length sigmoid colonic wall thickening and pericolonic edema, similar in distribution to prior exam, likely corresponding to reported stricture on colonoscopy. Additional rectal and proximal descending colonic wall thickening are also similar in distribution. CT findings suggest active colitis. 2. Moderate proximal stool burden. 3. Postcholecystectomy with mild biliary dilatation. Recommend correlation with LFTs. If normal LFTs, findings likely secondary to prior cholecystectomy and no further evaluation is needed. If elevated, consider further evaluation with ERCP or MRCP. 4.  Aortic Atherosclerosis (ICD10-I70.0). Electronically Signed   By: Jeb Levering M.D.   On: 08/18/2017 22:35              Blood pressure (!) 116/47, pulse (!) 102, temperature 99 F (37.2 C), temperature source Oral, resp. rate 20, height 5' 4"  (1.626 m), weight 75 kg (165 lb 5.5 oz), SpO2 96 %.  Physical exam:   General--Pleasant white female in no  distress ENT--nonicteric Neck--supple no lymphadenopathy Heart--regular rate and rhythm without murmurs or gallops Lungs--clear Abdomen--nondistended soft and nontender bowel sounds normal Psych--mood and affect appropriate answers questions appropriately   Assessment: 1.  Active colitis in the rectosigmoid area now with nearly obstructing stricture.  Has had documented CMV inclusions in the past but has not responded to treatment.  Feel that it is important we get further biopsies to see if CMV is still active.  Plan: 1.  We will allow a small amount of clear liquids and ice chips. 2.  We will prep with tap water enemas and attempt sigmoidoscopy and biopsy tomorrow with the pediatric endoscope.  If we are unable to get through with this scope she will likely need a diverting colostomy.   Nancy Fetter 08/19/2017, 2:22 PM   This note was created using voice recognition software and minor errors may Have occurred unintentionally. Pager: [phone] If no answer or after hours call [phone]

## 2017-08-20 ENCOUNTER — Inpatient Hospital Stay (HOSPITAL_COMMUNITY): Payer: Medicare Other | Admitting: Anesthesiology

## 2017-08-20 ENCOUNTER — Encounter (HOSPITAL_COMMUNITY): Payer: Self-pay

## 2017-08-20 ENCOUNTER — Encounter (HOSPITAL_COMMUNITY)
Admission: AD | Disposition: A | Discharge status: Skilled Nursing Facility | Payer: Self-pay | Source: Ambulatory Visit | Attending: Internal Medicine

## 2017-08-20 DIAGNOSIS — K56699 Other intestinal obstruction unspecified as to partial versus complete obstruction: Principal | ICD-10-CM

## 2017-08-20 HISTORY — PX: FLEXIBLE SIGMOIDOSCOPY: SHX5431

## 2017-08-20 LAB — BASIC METABOLIC PANEL
Anion gap: 5 (ref 5–15)
BUN: 6 mg/dL (ref 6–20)
CHLORIDE: 104 mmol/L (ref 101–111)
CO2: 24 mmol/L (ref 22–32)
CREATININE: 0.52 mg/dL (ref 0.44–1.00)
Calcium: 8.5 mg/dL — ABNORMAL LOW (ref 8.9–10.3)
GFR calc Af Amer: >60 mL/min (ref >60)
GFR calc non Af Amer: >60 mL/min (ref >60)
Glucose, Bld: 122 mg/dL — ABNORMAL HIGH (ref 65–99)
Potassium: 3.4 mmol/L — ABNORMAL LOW (ref 3.5–5.1)
SODIUM: 133 mmol/L — AB (ref 135–145)

## 2017-08-20 LAB — MAGNESIUM: MAGNESIUM: 1.8 mg/dL (ref 1.7–2.4)

## 2017-08-20 SURGERY — SIGMOIDOSCOPY, FLEXIBLE
Anesthesia: Monitor Anesthesia Care

## 2017-08-20 MED ORDER — PROPOFOL 500 MG/50ML IV EMUL
INTRAVENOUS | Status: DC | PRN
  Administered 2017-08-20: 100 ug/kg/min via INTRAVENOUS

## 2017-08-20 MED ORDER — PROPOFOL 10 MG/ML IV BOLUS
INTRAVENOUS | Status: AC
  Filled 2017-08-20: qty 40

## 2017-08-20 MED ORDER — KETOROLAC TROMETHAMINE 30 MG/ML IJ SOLN
30.0000 mg | Freq: Once | INTRAMUSCULAR | Status: AC
  Administered 2017-08-20: 30 mg via INTRAVENOUS
  Filled 2017-08-20: qty 1

## 2017-08-20 MED ORDER — SODIUM CHLORIDE 0.9 % IV SOLN: INTRAVENOUS | Status: DC

## 2017-08-20 MED ORDER — PROPOFOL 10 MG/ML IV BOLUS
INTRAVENOUS | Status: DC | PRN
  Administered 2017-08-20: 60 mg via INTRAVENOUS

## 2017-08-20 MED ORDER — POLYETHYLENE GLYCOL 3350 17 G PO PACK
17.0000 g | PACK | Freq: Two times a day (BID) | ORAL | Status: DC
  Administered 2017-08-20 – 2017-08-21 (×3): 17 g via ORAL
  Filled 2017-08-20 (×5): qty 1

## 2017-08-20 MED ORDER — LACTATED RINGERS IV SOLN
INTRAVENOUS | Status: DC
  Administered 2017-08-20 (×2): via INTRAVENOUS

## 2017-08-20 NOTE — Anesthesia Preprocedure Evaluation (Deleted)
Anesthesia Evaluation    Airway       Dental   Pulmonary former smoker,          Cardiovascular hypertension,     Neuro/Psych    GI/Hepatic   Endo/Other    Renal/GU      Musculoskeletal   Abdominal   Peds  Hematology   Anesthesia Other Findings   Reproductive/Obstetrics                          Anesthesia Physical Anesthesia Plan Anesthesia Quick Evaluation  

## 2017-08-20 NOTE — Anesthesia Preprocedure Evaluation (Signed)
Anesthesia Evaluation  Patient identified by MRN, date of birth, ID band Patient awake    Reviewed: Allergy & Precautions, H&P , Patient's Chart, lab work & pertinent test results, reviewed documented beta blocker date and time   Airway Mallampati: II  TM Distance: >3 FB Neck ROM: full    Dental no notable dental hx.    Pulmonary asthma , former smoker,    Pulmonary exam normal breath sounds clear to auscultation       Cardiovascular Exercise Tolerance: Good hypertension,  Rhythm:regular Rate:Normal     Neuro/Psych    GI/Hepatic   Endo/Other    Renal/GU      Musculoskeletal   Abdominal   Peds  Hematology   Anesthesia Other Findings   Reproductive/Obstetrics                             Anesthesia Physical Anesthesia Plan  ASA: III  Anesthesia Plan: MAC   Post-op Pain Management:    Induction: Intravenous  PONV Risk Score and Plan:   Airway Management Planned: Mask and Natural Airway  Additional Equipment:   Intra-op Plan:   Post-operative Plan:   Informed Consent: I have reviewed the patients History and Physical, chart, labs and discussed the procedure including the risks, benefits and alternatives for the proposed anesthesia with the patient or authorized representative who has indicated his/her understanding and acceptance.   Dental Advisory Given  Plan Discussed with: CRNA and Surgeon  Anesthesia Plan Comments:         Anesthesia Quick Evaluation

## 2017-08-20 NOTE — Progress Notes (Signed)
Tap water enema given. Lina SarBeth Remonia Otte, RN

## 2017-08-20 NOTE — Progress Notes (Signed)
Patient complains of pain and discomfort on the  Abdominal region  but refused to take Morphine for pain and reports taking Aleeve for pain at home. Paged on NP for pain medicine re-evaluation. We will continue to monitor

## 2017-08-20 NOTE — Op Note (Signed)
[facility] Patient Name: Sharon CamaraJudith Gilmore Procedure Date: 08/20/2017 MRN: [id number] Attending MD: Tresea MallJames L Hamdi Vari Dr., MD Date of Birth: Jan 19, 1934 CSN: [id number] Age: [age] Admit Type: Inpatient Procedure:                Flexible Sigmoidoscopy Indications:              Abdominal pain in the left lower quadrant,                            Hematochezia, Infectious diarrhea, patient has had                            recent documented CMV colitis treated by ID. His                            had continued symptoms of diarrhea some bleeding                            abdominal pain and attempted sigmoidoscopy                            yesterday by Dr. Matthias HughsBuccini with pediatric scope                            reveal Sharon Gilmore stricturing ulceration in the scope is                            unable to be passed. Procedure is repeated with                            pediatric (discount Providers:                Sharon FearingJames L. Eyoel Throgmorton Dr., MD, Bonney LeitzShanice Gilmore, Sharon RobinsonsGuillaume                            Gilmore, Technician Referring MD:              Medicines:                Monitored Anesthesia Care Complications:            No immediate complications. Estimated Blood Loss:     Estimated blood loss was minimal. Procedure:                Pre-Anesthesia Assessment:                           - Prior to the procedure, a History and Physical                            was performed, and patient medications and                            allergies were reviewed. The patient's tolerance of                            previous anesthesia  was also reviewed. The risks                            and benefits of the procedure and the sedation                            options and risks were discussed with the patient.                            All questions were answered, and informed consent                            was obtained. Prior Anticoagulants: The patient has                            taken no  previous anticoagulant or antiplatelet                            agents. ASA Grade Assessment: III - A patient with                            severe systemic disease. After reviewing the risks                            and benefits, the patient was deemed in                            satisfactory condition to undergo the procedure.                           After obtaining informed consent, the scope was                            passed under direct vision. The [id number])                            scope was introduced through the anus and advanced                            to the the sigmoid colon. The flexible                            sigmoidoscopy was accomplished without difficulty.                            The patient tolerated the procedure well. The                            quality of the bowel preparation was fair. Scope In: 1:56:58 PM Scope Out: 2:07:08 PM Total Procedure Duration: 0 hours 10 minutes 10 seconds  Findings:      The perianal and digital rectal examinations were normal.      Diffuse severe inflammation characterized by erosions, friability,  linear erosions, confluent ulcerations and deep ulcerations was found in       the recto-sigmoid colon. Biopsies were taken with a cold forceps for       histology. This was biopsied with a cold forceps for viral culture for       CMV. The stricture and ulcerated area was noted to be about 15 cm from       the anal verge. Interestingly the rectum itself between this area in the       anus appeared endoscopically normal.      Normal mucosa was found in the distal sigmoid colon. Large quantity of       stool prevented passage of the scope much beyond 40 cm. Impression:               - Preparation of the colon was fair.                           - Diffuse severe inflammation was found in the                            recto-sigmoid colon. Biopsied.                           - Normal mucosa in the distal  sigmoid colon. Moderate Sedation:      See anesthesia note, no moderate sedation. Recommendation:           - Return patient to hospital ward for ongoing care.                           - Full liquid diet. Procedure Code(s):        --- Professional ---                           [phone], Sigmoidoscopy, flexible; with biopsy, single                            or multiple Diagnosis Code(s):        --- Professional ---                           K52.9, Noninfective gastroenteritis and colitis,                            unspecified                           K92.1, Melena (includes Hematochezia)                           R10.32, Left lower quadrant pain CPT copyright 2016 [employer]. All rights reserved. The codes documented in this report are preliminary and upon coder review may  be revised to meet current compliance requirements. Tresea Mall Dr., MD 08/20/2017 2:35:34 PM This report has been signed electronically. Number of Addenda: 0

## 2017-08-20 NOTE — Progress Notes (Signed)
Two tap water enemas given. Patient tolerated fair and returned clear brown liquid with some flecks of brown stool. Lina SarBeth Asier Desroches, RN

## 2017-08-20 NOTE — Interval H&P Note (Signed)
History and Physical Interval Note:  08/20/2017 1:43 PM  Sharon Gilmore  has presented today for surgery, with the diagnosis of sigmoid stricture, colitis  The various methods of treatment have been discussed with the patient and family. After consideration of risks, benefits and other options for treatment, the patient has consented to  Procedure(s): FLEXIBLE SIGMOIDOSCOPY (N/A) as a surgical intervention .  The patient's history has been reviewed, patient examined, no change in status, stable for surgery.  I have reviewed the patient's chart and labs.  Questions were answered to the patient's satisfaction.     Tresea MallJames L Franchon Ketterman

## 2017-08-20 NOTE — Transfer of Care (Signed)
Immediate Anesthesia Transfer of Care Note  Patient: Sharon Gilmore  Procedure(s) Performed: FLEXIBLE SIGMOIDOSCOPY (N/A )  Patient Location: PACU and Endoscopy Unit  Anesthesia Type:MAC  Level of Consciousness: awake and alert   Airway & Oxygen Therapy: Patient Spontanous Breathing  Post-op Assessment: Report given to RN and Post -op Vital signs reviewed and stable  Post vital signs: Reviewed and stable  Last Vitals:  Vitals:   08/20/17 0540 08/20/17 1251  BP: (!) 147/62 (!) 151/64  Pulse: 83 73  Resp: 18 18  Temp: 36.9 C 36.7 C  SpO2: 96% 96%    Last Pain:  Vitals:   08/20/17 1251  TempSrc: Oral  PainSc:       Patients Stated Pain Goal: 2 (98/92/11 9417)  Complications: No apparent anesthesia complications

## 2017-08-20 NOTE — Progress Notes (Signed)
[facility]    Date of Admission:  08/18/2017   Total days of antibiotics 2        Day 2 IV GCV           ID: Sharon Gilmore is a 84 y.o. 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  She was able to tolerate flex sig - dr Randa Evens procedure note commented on -- Diffuse severe inflammation characterized by erosions, friability,       linear erosions, confluent ulcerations and deep ulcerations was found in       the recto-sigmoid colon. Biopsies were taken with a cold forceps for       histology. This was biopsied with a cold forceps for viral culture for       CMV. The stricture and ulcerated area was noted to be about 15 cm from       the anal verge. Interestingly the rectum itself between this area in the       anus appeared endoscopically normal.      Normal mucosa was found in the distal sigmoid colon.    Medications:  . dronabinol  2.5 mg Oral BID AC  . heparin  5,000 Units Subcutaneous Q8H  . pantoprazole (PROTONIX) IV  40 mg Intravenous Q12H  . polyethylene glycol  17 g Oral BID  . sucralfate  1 g Oral TID WC & HS    Objective: Vital signs in last 24 hours: Temp:  [98.1 F (36.7 C)-98.5 F (36.9 C)] 98.5 F (36.9 C) (01/04 1512) Pulse Rate:  [72-83] 76 (01/04 1512) Resp:  [16-28] 18 (01/04 1512) BP: (99-151)/(37-66) 120/66 (01/04 1512) SpO2:  [96 %-98 %] 98 % (01/04 1512) Physical Exam  Constitutional:  oriented to person, place, and time. appears well-developed and well-nourished. Eating soup. No distress.  HENT: [place]/AT, PERRLA, no scleral icterus Mouth/Throat: Oropharynx is clear and moist. No oropharyngeal exudate.  Abdominal: Soft. Bowel  sounds are normal.  mildly distension. There is no tenderness.  Skin: Skin is warm and dry. No rash noted. No erythema.  Psychiatric: a normal mood and affect.  behavior is normal.    Lab Results Recent Labs    08/18/17 1740 08/19/17 0421 08/20/17 0403  WBC 7.6 7.5  --   HGB 9.7* 9.3*  --   HCT 31.1* 29.1*  --   NA  --  136 133*  K  --  3.6 3.4*  CL  --  106 104  CO2  --  25 24  BUN  --  6 6  CREATININE 0.53 0.45 0.52   Liver Panel Recent Labs    08/19/17 0421  PROT 5.7*  ALBUMIN 2.5*  AST 14*  ALT 12*  ALKPHOS 85  BILITOT 0.3    Microbiology: none Studies/Results: Ct Abdomen Pelvis W Contrast  Result Date: 08/18/2017 CLINICAL DATA:  Colonoscopy earlier today with stricture 15 cm from the anus. Recent diagnosis and treatment for CMV colitis. EXAM: CT ABDOMEN AND PELVIS WITH CONTRAST TECHNIQUE: Multidetector CT imaging of the abdomen and pelvis was performed using the standard protocol following bolus administration of intravenous contrast. CONTRAST:  100 cc Isovue-300 IV COMPARISON:  Most recent CT 05/16/2017,  additional priors FINDINGS: Lower chest: Bibasilar atelectasis, right greater than left. Resolved pleural effusions. Pacemaker is partially included. Hepatobiliary: No focal hepatic lesion. Postcholecystectomy. Prominent common bile duct measuring 12 mm, with mild central intrahepatic biliary ductal dilatation. No visualized choledocholithiasis. Pancreas: Parenchymal atrophy. No ductal dilatation or inflammation. Spleen: Normal in size without focal abnormality. Splenule at the hilum. Adrenals/Urinary Tract: No adrenal nodule. No hydronephrosis or perinephric edema. Homogeneous renal enhancement with symmetric excretion on delayed phase imaging. Urinary bladder is physiologically distended without wall thickening. Stomach/Bowel: Moderate length segment of colonic wall thickening in the mid sigmoid with pericolonic edema. Mild rectal wall thickening. Presacral edema appears  similar to prior. Colonic wall thickening of the proximal descending colon with persistent but improved pericolonic inflammatory change. Moderate to large stool burden in the more proximal colon. Small volume of formed stool in the distal descending and proximal sigmoid colon with minimal diverticulosis. No diverticulitis, pneumatosis or perforation. Administered enteric contrast reaches the hepatic flexure. No small bowel inflammation or obstruction. Normal appendix. Stomach is nondistended. Vascular/Lymphatic: Aortic and branch atherosclerosis. Circumaortic left renal vein. Small retroperitoneal nodes. No definite pericolonic or pelvic adenopathy. Reproductive: Atrophic uterus. Low-density structure in the right adnexa measuring 2.4 cm, higher than simple fluid density, similar in size compared to prior exams. No air in the endometrial cavity, minimal air in the vagina. Other: No free air or ascites. Presacral edema appear similar to prior exam. Musculoskeletal: Posterior L2 through L5 fusion with posterior rods and intrapedicular screws. The right L2 pedicle screw extends lateral to the vertebral body, unchanged. No acute osseous abnormality. IMPRESSION: 1. Moderate length sigmoid colonic wall thickening and pericolonic edema, similar in distribution to prior exam, likely corresponding to reported stricture on colonoscopy. Additional rectal and proximal descending colonic wall thickening are also similar in distribution. CT findings suggest active colitis. 2. Moderate proximal stool burden. 3. Postcholecystectomy with mild biliary dilatation. Recommend correlation with LFTs. If normal LFTs, findings likely secondary to prior cholecystectomy and no further evaluation is needed. If elevated, consider further evaluation with ERCP or MRCP. 4.  Aortic Atherosclerosis (ICD10-I70.0). Electronically Signed   By: Rubye OaksMelanie  Ehinger M.D.   On: 08/18/2017 22:35     Assessment/Plan: cmv colitis = continue with renally  dosed ganciclovir BID. Will watch for BM suppression while she is here. Await for path from biopsy results from today's procedure to see if appears to be worsening cmv colitis. If path + for CMV - would commit to IV ganciclovir x 2 wk. Prefer to wait to get path back instead of automatically placing picc line for IV antiviral  Currently on chemo isolation, which I think maybe a mistake. She is on cytovene (antiviral) but not sure if this is part of manufacturer guidelines  Diarrhea = continue to monitor to see if she gets any response with ganciclovir.   Dr Luciana Axeomer to see over the weekend. I will see back on monday  [facility] Rykar [facility] Cell: [phone] Pager: [phone]  08/20/2017, 4:47 PM

## 2017-08-20 NOTE — Progress Notes (Signed)
Patient ID: Sharon Gilmore, female   DOB: 1934-04-11, 84 y.o.   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  General surgery was consulted   Assessment & Plan:   Active Problems:   Abdominal pain   Essential hypertension   History of CMV colitis (HCC)   Cardiac pacemaker in situ   Weight loss, non-intentional   Colonic stricture (HCC)   Sigmoid Colonic stricture with partial colonic obstruction -General surgery following.  Currently being medically managed.   -Plan for sigmoidoscopy today per GI  History of recent CMV colitis -ID following.  Currently on gancyclovir.  Monitor renal function  Hypertension -Monitor blood pressure.  Lasix on hold.  History of second-degree AV block status post pacemaker placement  -outpatient follow-up with electrophysiology  Chronic diastolic dysfunction -Outpatient follow-up with cardiology.  Lasix on hold for now  Mild hypokalemia -Continue replacement with IV fluids.  Repeat labs    DVT prophylaxis: Lovenox Code Status: Full Family Communication: Spoke to family member at bedside Disposition Plan: Depends on clinical outcome  Consultants: General surgery, GI, ID  Procedures: None  Antimicrobials: Ganciclovir   Subjective: Patient seen and examined at bedside.  She complains of abdominal pressure.  Currently not nauseous but no active vomiting.  No overnight fever.  No bowel movements overnight.  Objective: Vitals:   08/19/17 0625 08/19/17  1347 08/19/17 2139 08/20/17 0540  BP: (!) 143/64 (!) 116/47 (!) 130/59 (!) 147/62  Pulse: 92 (!) 102 83 83  Resp: 18 20 18 18   Temp: 99 F (37.2 C)  98.5 F (36.9 C) 98.5 F (36.9 C)  TempSrc: Oral Oral Oral Oral  SpO2: 92% 96% 96% 96%  Weight:      Height:        Intake/Output Summary (Last 24 hours) at 08/20/2017 0949 Last data filed at 08/20/2017 0600 Gross per 24 hour  Intake 910 ml  Output -  Net 910 ml   Filed Weights   08/18/17 2047  Weight: 75 kg (165 lb 5.5 oz)    Examination:  General exam: Appears calm and comfortable. No distress Respiratory system: Bilateral decreased breath sound at bases Cardiovascular system: S1 & S2 heard, rate controlled  gastrointestinal system: Abdomen is distended, soft and nontender.  Bowel sounds sluggish Extremities: No cyanosis, clubbing, edema    Data Reviewed: I have personally reviewed following labs and imaging studies  CBC: Recent Labs  Lab 08/18/17 1740 08/19/17 0421  WBC 7.6 7.5  NEUTROABS 4.8  --   HGB 9.7* 9.3*  HCT 31.1* 29.1*  MCV 87.9 87.9  PLT 370 364   Basic Metabolic Panel: Recent Labs  Lab 08/18/17 1740 08/19/17 0421 08/20/17 0403  NA  --  136 133*  K  --  3.6 3.4*  CL  --  106 104  CO2  --  25 24  GLUCOSE  --  113* 122*  BUN  --  6 6  CREATININE 0.53 0.45 0.52  CALCIUM  --  8.3* 8.5*  MG  --   --  1.8   GFR: Estimated Creatinine  Clearance: 54.7 mL/min (by C-G formula based on SCr of 0.52 mg/dL). Liver Function Tests: Recent Labs  Lab 08/19/17 0421  AST 14*  ALT 12*  ALKPHOS 85  BILITOT 0.3  PROT 5.7*  ALBUMIN 2.5*   No results for input(s): LIPASE, AMYLASE in the last 168 hours. No results for input(s): AMMONIA in the last 168 hours. Coagulation Profile: No results for input(s): INR, PROTIME in the last 168 hours. Cardiac Enzymes: No results for input(s): CKTOTAL, CKMB, CKMBINDEX, TROPONINI in the last 168 hours. BNP (last 3 results) Recent Labs    05/24/17 1150  PROBNP 397     HbA1C: No results for input(s): HGBA1C in the last 72 hours. CBG: No results for input(s): GLUCAP in the last 168 hours. Lipid Profile: No results for input(s): CHOL, HDL, LDLCALC, TRIG, CHOLHDL, LDLDIRECT in the last 72 hours. Thyroid Function Tests: No results for input(s): TSH, T4TOTAL, FREET4, T3FREE, THYROIDAB in the last 72 hours. Anemia Panel: No results for input(s): VITAMINB12, FOLATE, FERRITIN, TIBC, IRON, RETICCTPCT in the last 72 hours. Sepsis Labs: No results for input(s): PROCALCITON, LATICACIDVEN in the last 168 hours.  No results found for this or any previous visit (from the past 240 hour(s)).       Radiology Studies: Ct Abdomen Pelvis W Contrast  Result Date: 08/18/2017 CLINICAL DATA:  Colonoscopy earlier today with stricture 15 cm from the anus. Recent diagnosis and treatment for CMV colitis. EXAM: CT ABDOMEN AND PELVIS WITH CONTRAST TECHNIQUE: Multidetector CT imaging of the abdomen and pelvis was performed using the standard protocol following bolus administration of intravenous contrast. CONTRAST:  100 cc Isovue-300 IV COMPARISON:  Most recent CT 05/16/2017, additional priors FINDINGS: Lower chest: Bibasilar atelectasis, right greater than left. Resolved pleural effusions. Pacemaker is partially included. Hepatobiliary: No focal hepatic lesion. Postcholecystectomy. Prominent common bile duct measuring 12 mm, with mild central intrahepatic biliary ductal dilatation. No visualized choledocholithiasis. Pancreas: Parenchymal atrophy. No ductal dilatation or inflammation. Spleen: Normal in size without focal abnormality. Splenule at the hilum. Adrenals/Urinary Tract: No adrenal nodule. No hydronephrosis or perinephric edema. Homogeneous renal enhancement with symmetric excretion on delayed phase imaging. Urinary bladder is physiologically distended without wall thickening. Stomach/Bowel: Moderate length segment of colonic wall thickening in the mid sigmoid with pericolonic  edema. Mild rectal wall thickening. Presacral edema appears similar to prior. Colonic wall thickening of the proximal descending colon with persistent but improved pericolonic inflammatory change. Moderate to large stool burden in the more proximal colon. Small volume of formed stool in the distal descending and proximal sigmoid colon with minimal diverticulosis. No diverticulitis, pneumatosis or perforation. Administered enteric contrast reaches the hepatic flexure. No small bowel inflammation or obstruction. Normal appendix. Stomach is nondistended. Vascular/Lymphatic: Aortic and branch atherosclerosis. Circumaortic left renal vein. Small retroperitoneal nodes. No definite pericolonic or pelvic adenopathy. Reproductive: Atrophic uterus. Low-density structure in the right adnexa measuring 2.4 cm, higher than simple fluid density, similar in size compared to prior exams. No air in the endometrial cavity, minimal air in the vagina. Other: No free air or ascites. Presacral edema appear similar to prior exam. Musculoskeletal: Posterior L2 through L5 fusion with posterior rods and intrapedicular screws. The right L2 pedicle screw extends lateral to the vertebral body, unchanged. No acute osseous abnormality. IMPRESSION: 1. Moderate length sigmoid colonic wall thickening and pericolonic edema, similar in distribution to prior exam, likely corresponding to reported stricture on colonoscopy. Additional rectal and proximal descending colonic wall thickening are also similar in distribution. CT findings suggest active colitis.  2. Moderate proximal stool burden. 3. Postcholecystectomy with mild biliary dilatation. Recommend correlation with LFTs. If normal LFTs, findings likely secondary to prior cholecystectomy and no further evaluation is needed. If elevated, consider further evaluation with ERCP or MRCP. 4.  Aortic Atherosclerosis (ICD10-I70.0). Electronically Signed   By: Rubye OaksMelanie  Ehinger M.D.   On: 08/18/2017 22:35         Scheduled Meds: . dronabinol  2.5 mg Oral BID AC  . heparin  5,000 Units Subcutaneous Q8H  . pantoprazole (PROTONIX) IV  40 mg Intravenous Q12H  . sucralfate  1 g Oral TID WC & HS   Continuous Infusions: . dextrose 5 % and 0.45 % NaCl with KCl 10 mEq/L 1,000 mL (08/20/17 0123)  . ganciclovir (CYTOVENE) IV 155 mg (08/20/17 0847)     LOS: 2 days        Glade LloydKshitiz Morine Kohlman, MD [facility] Pager [phone]  If 7PM-7AM, please contact night-coverage [email or web address] Password [email or web address] 08/20/2017, 9:49 AM

## 2017-08-20 NOTE — Anesthesia Postprocedure Evaluation (Signed)
Anesthesia Post Note  Patient: Sharon Gilmore  Procedure(s) Performed: FLEXIBLE SIGMOIDOSCOPY (N/A )     Patient location during evaluation: PACU Anesthesia Type: MAC Level of consciousness: awake and alert Pain management: pain level controlled Vital Signs Assessment: post-procedure vital signs reviewed and stable Respiratory status: spontaneous breathing, nonlabored ventilation, respiratory function stable and patient connected to nasal cannula oxygen Cardiovascular status: stable and blood pressure returned to baseline Postop Assessment: no apparent nausea or vomiting Anesthetic complications: no    Last Vitals:  Vitals:   08/20/17 1440 08/20/17 1512  BP: (!) 107/59 120/66  Pulse: 72 76  Resp: (!) 21 18  Temp:  36.9 C  SpO2: 97% 98%    Last Pain:  Vitals:   08/20/17 1512  TempSrc: Oral  PainSc:                  Riccardo Dubin

## 2017-08-20 NOTE — Progress Notes (Signed)
    CC:  CMV colitis    Subjective: She slept better last PM, but still having allot of stools.  Abdomen is not tender or distended.    Objective: Vital signs in last 24 hours: Temp:  [98.5 F (36.9 C)] 98.5 F (36.9 C) (01/04 0540) Pulse Rate:  [83-102] 83 (01/04 0540) Resp:  [18-20] 18 (01/04 0540) BP: (116-147)/(47-62) 147/62 (01/04 0540) SpO2:  [96 %] 96 % (01/04 0540) Last BM Date: 08/19/17 NPO 900 IV Voided x 13 BM x 11 Afebrile, VSS K+ 3.4  Intake/Output from previous day: 01/03 0701 - 01/04 0700 In: 910 [P.O.:10; I.V.:900] Out: -  Intake/Output this shift: No intake/output data recorded.  General appearance: alert, cooperative and no distress Resp: clear to auscultation bilaterally  Lab Results:  Recent Labs    08/18/17 1740 08/19/17 0421  WBC 7.6 7.5  HGB 9.7* 9.3*  HCT 31.1* 29.1*  PLT 370 364    BMET Recent Labs    08/19/17 0421 08/20/17 0403  NA 136 133*  K 3.6 3.4*  CL 106 104  CO2 25 24  GLUCOSE 113* 122*  BUN 6 6  CREATININE 0.45 0.52  CALCIUM 8.3* 8.5*   PT/INR No results for input(s): LABPROT, INR in the last 72 hours.  Recent Labs  Lab 08/19/17 0421  AST 14*  ALT 12*  ALKPHOS 85  BILITOT 0.3  PROT 5.7*  ALBUMIN 2.5*     Lipase     Component Value Date/Time   LIPASE 26 05/16/2017 1745     Medications: . dronabinol  2.5 mg Oral BID AC  . heparin  5,000 Units Subcutaneous Q8H  . pantoprazole (PROTONIX) IV  40 mg Intravenous Q12H  . sucralfate  1 g Oral TID WC & HS    Assessment/Plan Sigmoid colonic stricture/partial obstruction Active -  ?CMV colitis Abdominal pain/weight loss  Hypertension History of CMV colitis. Permanent transvenous pacemaker -symptomatic secondary AV block/history of PAF Nonobstructive coronary artery disease.  FEN: IV fluids/n.p.o. ID: No antibiotics DVT: Heparin Foley: Follow-up: TBD  Plan:  I told her and her son we are the back up plan.  If she does not improve with  Medical management then we would need to intervene.  Will defer to ID and GI for now.  Her son ask about how long we would wait and I said we would defer to ID.  He also ask about nutrition and I said we would defer to the Medical staff.     Flex sig shows significant inflammation in the Rectosigmoid area.    LOS: 2 days    Sharon Gilmore 08/20/2017 [phone]

## 2017-08-21 DIAGNOSIS — A0839 Other viral enteritis: Secondary | ICD-10-CM

## 2017-08-21 DIAGNOSIS — B259 Cytomegaloviral disease, unspecified: Secondary | ICD-10-CM

## 2017-08-21 LAB — BASIC METABOLIC PANEL
Anion gap: 4 — ABNORMAL LOW (ref 5–15)
CHLORIDE: 109 mmol/L (ref 101–111)
CO2: 24 mmol/L (ref 22–32)
CREATININE: 0.5 mg/dL (ref 0.44–1.00)
Calcium: 8.2 mg/dL — ABNORMAL LOW (ref 8.9–10.3)
GFR calc Af Amer: >60 mL/min (ref >60)
GFR calc non Af Amer: >60 mL/min (ref >60)
GLUCOSE: 111 mg/dL — AB (ref 65–99)
POTASSIUM: 3.4 mmol/L — AB (ref 3.5–5.1)
Sodium: 137 mmol/L (ref 135–145)

## 2017-08-21 LAB — MAGNESIUM: Magnesium: 2 mg/dL (ref 1.7–2.4)

## 2017-08-21 LAB — CBC WITH DIFFERENTIAL/PLATELET
BASOS ABS: 0 10*3/uL (ref 0.0–0.1)
BASOS PCT: 1 %
EOS PCT: 8 %
Eosinophils Absolute: 0.4 10*3/uL (ref 0.0–0.7)
HEMATOCRIT: 29 % — AB (ref 36.0–46.0)
HEMOGLOBIN: 9.2 g/dL — AB (ref 12.0–15.0)
Lymphocytes Relative: 34 %
Lymphs Abs: 1.6 10*3/uL (ref 0.7–4.0)
MCH: 27.7 pg (ref 26.0–34.0)
MCHC: 31.7 g/dL (ref 30.0–36.0)
MCV: 87.3 fL (ref 78.0–100.0)
MONO ABS: 0.8 10*3/uL (ref 0.1–1.0)
Monocytes Relative: 17 %
NEUTROS ABS: 1.9 10*3/uL (ref 1.7–7.7)
Neutrophils Relative %: 40 %
Platelets: 315 10*3/uL (ref 150–400)
RBC: 3.32 MIL/uL — AB (ref 3.87–5.11)
RDW: 16.3 % — ABNORMAL HIGH (ref 11.5–15.5)
WBC: 4.7 10*3/uL (ref 4.0–10.5)

## 2017-08-21 LAB — CMV DNA, QUANTITATIVE, PCR
CMV DNA QUANT: NEGATIVE [IU]/mL
Log10 CMV Qn DNA Pl: UNDETERMINED log10 IU/mL

## 2017-08-21 NOTE — Progress Notes (Signed)
Patient ID: Sharon Gilmore Heffelfinger, female   DOB: [birth year], 84 y.o.   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  General surgery was consulted   Assessment & Plan:   Active Problems:   Abdominal pain   Essential hypertension   History of CMV colitis (HCC)   Cardiac pacemaker in situ   Weight loss, non-intentional   Colonic stricture (HCC)   Sigmoid Colonic stricture with partial colonic obstruction -General surgery following.  Currently being medically managed.   -Status post sigmoidoscopy on 08/20/2017 which showed diffuse severe inflammation in the rectosigmoid colon.  GI following  History of recent CMV colitis -ID following.  Currently on gancyclovir.  Monitor renal function and CBC  Hypertension -Monitor blood pressure.  Lasix on hold.  History of second-degree AV block status post pacemaker placement  -outpatient follow-up with electrophysiology  Chronic diastolic dysfunction -Outpatient follow-up with cardiology.  Lasix on hold for now  Mild hypokalemia -Continue replacement with IV fluids.  Repeat Gilmore.m. labs    DVT prophylaxis: Lovenox Code Status: Full Family Communication: Spoke to family member at bedside Disposition Plan: Depends on clinical outcome  Consultants: General surgery, GI, ID  Procedures: None  Antimicrobials: Ganciclovir   Subjective: Patient seen and examined at bedside.  She complains of abdominal pressure.  Currently not nauseous . No overnight fever.     Objective: Vitals:   08/20/17 1440 08/20/17 1512 08/20/17 2131 08/21/17 0605  BP: (!) 107/59 120/66 139/60 (!) 144/63  Pulse: 72 76 87 68  Resp: (!) 21 18 18 16   Temp:  98.5 F (36.9 C) 98 F (36.7 C) 97.7 F (36.5 C)  TempSrc:  Oral Oral Oral  SpO2: 97% 98% 97% 98%  Weight:      Height:        Intake/Output Summary (Last 24 hours) at 08/21/2017 1302 Last data filed at 08/21/2017 1100 Gross per 24 hour  Intake 3205 ml  Output -  Net 3205 ml   Filed Weights   08/18/17 2047  Weight: 75 kg (165 lb 5.5 oz)    Examination:  General exam: No distress Respiratory system: Bilateral decreased breath sound at bases Cardiovascular system: S1 & S2 heard, rate controlled  gastrointestinal system: Abdomen is distended, soft and nontender.  Bowel sounds sluggish Extremities: No cyanosis, clubbing, edema    Data Reviewed: I have personally reviewed following labs and imaging studies  CBC: Recent Labs  Lab 08/18/17 1740 08/19/17 0421 08/21/17 0528  WBC 7.6 7.5 4.7  NEUTROABS 4.8  --  1.9  HGB 9.7* 9.3* 9.2*  HCT 31.1* 29.1* 29.0*  MCV 87.9 87.9 87.3  PLT 370 364 315   Basic Metabolic Panel: Recent Labs  Lab 08/18/17 1740 08/19/17 0421 08/20/17 0403 08/21/17 0528  NA  --  136 133* 137  K  --  3.6 3.4* 3.4*  CL  --  106 104 109  CO2  --  25 24 24   GLUCOSE  --  113* 122* 111*  BUN  --  6 6 <5*  CREATININE 0.53 0.45 0.52 0.50  CALCIUM  --  8.3* 8.5* 8.2*  MG  --   --  1.8 2.0   GFR: Estimated Creatinine Clearance: 54.7 mL/min (by C-G formula based on SCr of 0.5 mg/dL). Liver Function Tests: Recent Labs  Lab 08/19/17 0421  AST 14*  ALT 12*  ALKPHOS 85  BILITOT 0.3  PROT 5.7*  ALBUMIN 2.5*   No results for input(s): LIPASE, AMYLASE in the last 168 hours. No results for input(s): AMMONIA in the last 168 hours. Coagulation Profile: No results for input(s): INR, PROTIME in the last 168 hours. Cardiac Enzymes: No results for input(s): CKTOTAL, CKMB,  CKMBINDEX, TROPONINI in the last 168 hours. BNP (last 3 results) Recent Labs    05/24/17 1150  PROBNP 397   HbA1C: No results for input(s): HGBA1C in the last 72 hours. CBG: No results for input(s): GLUCAP in the last 168 hours. Lipid Profile: No results for input(s): CHOL, HDL, LDLCALC, TRIG, CHOLHDL, LDLDIRECT in the last 72 hours. Thyroid Function Tests: No results for input(s): TSH, T4TOTAL, FREET4, T3FREE, THYROIDAB in the last 72 hours. Anemia Panel: No results for input(s): VITAMINB12, FOLATE, FERRITIN, TIBC, IRON, RETICCTPCT in the last 72 hours. Sepsis Labs: No results for input(s): PROCALCITON, LATICACIDVEN in the last 168 hours.  No results found for this or any previous visit (from the past 240 hour(s)).       Radiology Studies: No results found.      Scheduled Meds: . dronabinol  2.5 mg Oral BID AC  . heparin  5,000 Units Subcutaneous Q8H  . pantoprazole (PROTONIX) IV  40 mg Intravenous Q12H  . polyethylene glycol  17 g Oral BID  . sucralfate  1 g Oral TID WC & HS   Continuous Infusions: . dextrose 5 % and 0.45 % NaCl with KCl 10 mEq/L 75 mL/hr at 08/21/17 1212  . ganciclovir (CYTOVENE) IV 155 mg (08/21/17 1031)     LOS: 3 days        Glade Lloyd, MD [facility] Pager [phone]  If 7PM-7AM, please contact night-coverage [email or web address] Password [email or web address] 08/21/2017, 1:02 PM

## 2017-08-21 NOTE — Progress Notes (Signed)
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 Has been refusing Miralax Dr. Oletta Lamas ordered after colonoscopy. She is hopeful not to need surgery.   Physical Exam: Constitutional:  Vitals:   08/20/17 2131 08/21/17 0605  BP: 139/60 (!) 144/63  Pulse: 87 68  Resp: 18 16  Temp: 98 F (36.7 C) 97.7 F (36.5 C)  SpO2: 97% 98%   patient appears in NAD Eyes: anicteric Respiratory: Normal respiratory effort; CTA B Cardiovascular: RRR GI: soft, some mild tenderness, no guarding or rebound  Review of Systems: Constitutional: negative for fevers and chills Integument/breast: negative for rash  Lab Results  Component Value Date   WBC 4.7 08/21/2017   HGB 9.2 (L) 08/21/2017   HCT 29.0 (L) 08/21/2017   MCV 87.3 08/21/2017   PLT 315 08/21/2017    Lab Results  Component Value Date   CREATININE 0.50 08/21/2017   BUN <5 (L) 08/21/2017   NA 137 08/21/2017   K 3.4 (L) 08/21/2017   CL 109 08/21/2017   CO2 24 08/21/2017    Lab Results  Component Value Date   ALT 12 (L) 08/19/2017   AST 14 (L) 08/19/2017   ALKPHOS 85 08/19/2017     Microbiology: No results found for this or any previous visit (from the past 240 hour(s)).  Impression/Plan:  1. 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 Path pending.  Continue gancilovir  2.  Medication monitoring - no leukopenia and stable. Will continue to monitor.   3.  Biliary dilitation - normal alk phos, AST, ALT low.

## 2017-08-21 NOTE — Progress Notes (Signed)
Patient ID: Sharon Gilmore, female   DOB: 1934/04/26, 84 y.o.   MRN: [id number] [facility] Progress Note:   1 Day Post-Op  Subjective: Mental status is alert;  No abdominal pain complaints Objective: Vital signs in last 24 hours: Temp:  [97.7 F (36.5 C)-98.5 F (36.9 C)] 97.7 F (36.5 C) (01/05 0605) Pulse Rate:  [68-87] 68 (01/05 0605) Resp:  [16-28] 16 (01/05 0605) BP: (99-151)/(37-66) 144/63 (01/05 0605) SpO2:  [96 %-98 %] 98 % (01/05 0605)  Intake/Output from previous day: 01/04 0701 - 01/05 0700 In: 3265 [P.O.:840; I.V.:2125; IV Piggyback:300] Out: -  Intake/Output this shift: No intake/output data recorded.  Physical Exam: Work of breathing is normal.  Abdomen is soft and nontender  Lab Results:  Results for orders placed or performed during the hospital encounter of 08/18/17 (from the past 48 hour(s))  Basic metabolic panel     Status: Abnormal   Collection Time: 08/20/17  4:03 AM  Result Value Ref Range   Sodium 133 (L) 135 - 145 mmol/L   Potassium 3.4 (L) 3.5 - 5.1 mmol/L   Chloride 104 101 - 111 mmol/L   CO2 24 22 - 32 mmol/L   Glucose, Bld 122 (H) 65 - 99 mg/dL   BUN 6 6 - 20 mg/dL   Creatinine, Ser 0.52 0.44 - 1.00 mg/dL   Calcium 8.5 (L) 8.9 - 10.3 mg/dL   GFR calc non Af Amer >60 >60 mL/min   GFR calc Af Amer >60 >60 mL/min    Comment: (NOTE) The eGFR has been calculated using the CKD EPI equation. This calculation has not been validated in all clinical situations. eGFR's persistently <60 mL/min signify possible Chronic Kidney Disease.    Anion gap 5 5 - 15  Magnesium     Status: None   Collection Time: 08/20/17  4:03 AM  Result Value Ref Range   Magnesium 1.8 1.7 - 2.4 mg/dL  CBC with Differential/Platelet     Status: Abnormal   Collection Time: 08/21/17  5:28 AM  Result Value Ref Range   WBC 4.7 4.0 - 10.5 K/uL   RBC 3.32 (L) 3.87 - 5.11 MIL/uL   Hemoglobin 9.2 (L) 12.0 - 15.0 g/dL   HCT 29.0 (L) 36.0 - 46.0 %   MCV 87.3 78.0  - 100.0 fL   MCH 27.7 26.0 - 34.0 pg   MCHC 31.7 30.0 - 36.0 g/dL   RDW 16.3 (H) 11.5 - 15.5 %   Platelets 315 150 - 400 K/uL   Neutrophils Relative % 40 %   Neutro Abs 1.9 1.7 - 7.7 K/uL   Lymphocytes Relative 34 %   Lymphs Abs 1.6 0.7 - 4.0 K/uL   Monocytes Relative 17 %   Monocytes Absolute 0.8 0.1 - 1.0 K/uL   Eosinophils Relative 8 %   Eosinophils Absolute 0.4 0.0 - 0.7 K/uL   Basophils Relative 1 %   Basophils Absolute 0.0 0.0 - 0.1 K/uL  Basic metabolic panel     Status: Abnormal   Collection Time: 08/21/17  5:28 AM  Result Value Ref Range   Sodium 137 135 - 145 mmol/L   Potassium 3.4 (L) 3.5 - 5.1 mmol/L   Chloride 109 101 - 111 mmol/L   CO2 24 22 - 32 mmol/L   Glucose, Bld 111 (H) 65 - 99 mg/dL   BUN <5 (L) 6 - 20 mg/dL   Creatinine, Ser 0.50 0.44 - 1.00 mg/dL   Calcium 8.2 (L) 8.9 - 10.3 mg/dL  GFR calc non Af Amer >60 >60 mL/min   GFR calc Af Amer >60 >60 mL/min    Comment: (NOTE) The eGFR has been calculated using the CKD EPI equation. This calculation has not been validated in all clinical situations. eGFR's persistently <60 mL/min signify possible Chronic Kidney Disease.    Anion gap 4 (L) 5 - 15  Magnesium     Status: None   Collection Time: 08/21/17  5:28 AM  Result Value Ref Range   Magnesium 2.0 1.7 - 2.4 mg/dL    Radiology/Results: No results found.  Anti-infectives: Anti-infectives (From admission, onward)   Start     Dose/Rate Route Frequency Ordered Stop   08/19/17 1800  ganciclovir (CYTOVENE) 155 mg in sodium chloride 0.9 % 100 mL IVPB     2.5 mg/kg  62.8 kg (Adjusted) 100 mL/hr over 60 Minutes Intravenous Every 12 hours 08/19/17 1703     08/19/17 1645  ganciclovir (CYTOVENE) 315 mg in sodium chloride 0.9 % 100 mL IVPB  Status:  Discontinued     5 mg/kg  62.8 kg (Adjusted) 100 mL/hr over 60 Minutes Intravenous Every 12 hours 08/19/17 1642 08/19/17 1703      Assessment/Plan: Problem List: Patient Active Problem List   Diagnosis Date  Noted  . Colonic stricture ([place]) 08/18/2017  . Weight loss, non-intentional 07/05/2017  . URI (upper respiratory infection) 06/15/2017  . Medication monitoring encounter 06/11/2017  . Cardiac pacemaker in situ 05/24/2017  . Rectal abnormality 05/16/2017  . SBO (small bowel obstruction) ([place])   . PAF (paroxysmal atrial fibrillation) ([place])   . Second degree AV block, Mobitz type II   . 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 AKI (acute kidney injury) ([place])   . Urinary tract infection without hematuria   . Lumbar spinal stenosis 04/23/2017  . DOE (dyspnea on exertion) 03/08/2017  . AV block, 2nd degree 03/08/2017  . Essential hypertension 03/08/2017  . Family history of coronary artery disease in brother 03/08/2017  . History of asthma 03/08/2017  . AVB (atrioventricular block) 03/08/2017  . Abdominal pain 01/04/2017  . Spinal stenosis 01/04/2017    Observation for CMV colitis.   1 Day Post-Op    LOS: 3 days   Matt B. Hassell Done, MD, [facility] [phone] beeper [phone]  08/21/2017 9:20 AM

## 2017-08-22 LAB — CBC WITH DIFFERENTIAL/PLATELET
BASOS PCT: 0 %
Basophils Absolute: 0 10*3/uL (ref 0.0–0.1)
Eosinophils Absolute: 0.4 10*3/uL (ref 0.0–0.7)
Eosinophils Relative: 8 %
HEMATOCRIT: 29.7 % — AB (ref 36.0–46.0)
HEMOGLOBIN: 9.5 g/dL — AB (ref 12.0–15.0)
LYMPHS PCT: 37 %
Lymphs Abs: 2.1 10*3/uL (ref 0.7–4.0)
MCH: 28 pg (ref 26.0–34.0)
MCHC: 32 g/dL (ref 30.0–36.0)
MCV: 87.6 fL (ref 78.0–100.0)
Monocytes Absolute: 0.8 10*3/uL (ref 0.1–1.0)
Monocytes Relative: 14 %
NEUTROS ABS: 2.3 10*3/uL (ref 1.7–7.7)
NEUTROS PCT: 41 %
Platelets: 369 10*3/uL (ref 150–400)
RBC: 3.39 MIL/uL — ABNORMAL LOW (ref 3.87–5.11)
RDW: 16.3 % — ABNORMAL HIGH (ref 11.5–15.5)
WBC: 5.6 10*3/uL (ref 4.0–10.5)

## 2017-08-22 LAB — BASIC METABOLIC PANEL
ANION GAP: 6 (ref 5–15)
BUN: <5 mg/dL — ABNORMAL LOW (ref 6–20)
CHLORIDE: 108 mmol/L (ref 101–111)
CO2: 23 mmol/L (ref 22–32)
Calcium: 8.3 mg/dL — ABNORMAL LOW (ref 8.9–10.3)
Creatinine, Ser: 0.46 mg/dL (ref 0.44–1.00)
GFR calc non Af Amer: >60 mL/min (ref >60)
GLUCOSE: 109 mg/dL — AB (ref 65–99)
Potassium: 3.6 mmol/L (ref 3.5–5.1)
Sodium: 137 mmol/L (ref 135–145)

## 2017-08-22 LAB — MAGNESIUM: Magnesium: 1.9 mg/dL (ref 1.7–2.4)

## 2017-08-22 MED ORDER — FUROSEMIDE 10 MG/ML IJ SOLN
40.0000 mg | Freq: Every day | INTRAMUSCULAR | Status: DC
  Administered 2017-08-22: 40 mg via INTRAVENOUS
  Filled 2017-08-22: qty 4

## 2017-08-22 NOTE — Plan of Care (Signed)
Awaiting sigmoidoscopic biopsies.  [facility] will revisit once pathology back.

## 2017-08-22 NOTE — Progress Notes (Signed)
Patient ID: Sharon Gilmore, female   DOB: 11-Jun-1934, 84 y.o.   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  General surgery was consulted.  ID was also consulted.   Assessment & Plan:   Active Problems:   Abdominal pain   Essential hypertension   History of CMV colitis (HCC)   Cardiac pacemaker in situ   Weight loss, non-intentional   Colonic stricture (HCC)   Sigmoid Colonic stricture with partial colonic obstruction -General surgery following.  Currently being medically managed.   -Status post sigmoidoscopy on 08/20/2017 which showed diffuse severe inflammation in the rectosigmoid colon.  Pathology pending.  GI following  History of recent CMV colitis -ID following.  Currently on gancyclovir.  Monitor renal function and CBC  Hypertension -Monitor blood pressure.  Blood pressure on the higher side.  Resume Lasix.  History of second-degree AV block status post pacemaker placement  -outpatient follow-up with electrophysiology  Chronic diastolic dysfunction -Outpatient follow-up with cardiology.  Resume Lasix.  Mild hypokalemia -Continue replacement with IV fluids.  Repeat a.m. labs    DVT prophylaxis: Lovenox Code Status: Full Family Communication: None at bedside Disposition Plan: Depends on clinical outcome  Consultants: General surgery, GI, ID  Procedures: Sigmoidoscopy on 08/20/2017  Antimicrobials: Ganciclovir   Subjective: Patient seen and examined at bedside.  She complains  of nausea and abdominal pressure.   No overnight fever or vomiting.  Objective: Vitals:   08/21/17 0605 08/21/17 1358 08/21/17 2021 08/22/17 0444  BP: (!) 144/63 (!) 159/66 (!) 161/75 (!) 184/79  Pulse: 68 74 85 77  Resp: 16 16 18 18   Temp: 97.7 F (36.5 C) 98.5 F (36.9 C) 97.8 F (36.6 C) 97.7 F (36.5 C)  TempSrc: Oral  Oral Oral  SpO2: 98% 96% 97% 98%  Weight:      Height:        Intake/Output Summary (Last 24 hours) at 08/22/2017 1044 Last data filed at 08/22/2017 0935 Gross per 24 hour  Intake 1960 ml  Output 0 ml  Net 1960 ml   Filed Weights   08/18/17 2047  Weight: 75 kg (165 lb 5.5 oz)    Examination:  General exam: No distress.  Alert and awake Respiratory system: Bilateral decreased breath sound at bases Cardiovascular system: S1 & S2 heard, rate controlled  gastrointestinal system: Abdomen is distended, soft and nontender.  Bowel sounds sluggish Extremities: No cyanosis, clubbing, edema    Data Reviewed: I have personally reviewed following labs and imaging studies  CBC: Recent Labs  Lab 08/18/17 1740 08/19/17 0421 08/21/17 0528 08/22/17 0421  WBC 7.6 7.5 4.7 5.6  NEUTROABS 4.8  --  1.9 2.3  HGB 9.7* 9.3* 9.2* 9.5*  HCT 31.1* 29.1* 29.0* 29.7*  MCV 87.9 87.9 87.3 87.6  PLT 370 364 315 369   Basic Metabolic Panel: Recent Labs  Lab 08/18/17 1740 08/19/17 0421 08/20/17 0403 08/21/17 0528 08/22/17 0421  NA  --  136 133* 137 137  K  --  3.6 3.4* 3.4* 3.6  CL  --  106 104 109 108  CO2  --  25 24 24 23   GLUCOSE  --  113* 122* 111* 109*  BUN  --  6 6 <5* <5*  CREATININE 0.53 0.45 0.52 0.50 0.46  CALCIUM  --  8.3* 8.5* 8.2* 8.3*  MG  --   --  1.8 2.0 1.9   GFR: Estimated Creatinine Clearance: 54.7 mL/min (by C-G formula based on SCr of 0.46 mg/dL). Liver Function Tests: Recent Labs  Lab 08/19/17 0421  AST 14*  ALT 12*  ALKPHOS 85  BILITOT 0.3  PROT 5.7*  ALBUMIN 2.5*   No results for input(s): LIPASE, AMYLASE in the last 168  hours. No results for input(s): AMMONIA in the last 168 hours. Coagulation Profile: No results for input(s): INR, PROTIME in the last 168 hours. Cardiac Enzymes: No results for input(s): CKTOTAL, CKMB, CKMBINDEX, TROPONINI in the last 168 hours. BNP (last 3 results) Recent Labs    05/24/17 1150  PROBNP 397   HbA1C: No results for input(s): HGBA1C in the last 72 hours. CBG: No results for input(s): GLUCAP in the last 168 hours. Lipid Profile: No results for input(s): CHOL, HDL, LDLCALC, TRIG, CHOLHDL, LDLDIRECT in the last 72 hours. Thyroid Function Tests: No results for input(s): TSH, T4TOTAL, FREET4, T3FREE, THYROIDAB in the last 72 hours. Anemia Panel: No results for input(s): VITAMINB12, FOLATE, FERRITIN, TIBC, IRON, RETICCTPCT in the last 72 hours. Sepsis Labs: No results for input(s): PROCALCITON, LATICACIDVEN in the last 168 hours.  No results found for this or any previous visit (from the past 240 hour(s)).       Radiology Studies: No results found.      Scheduled Meds: . dronabinol  2.5 mg Oral BID AC  . heparin  5,000 Units Subcutaneous Q8H  . pantoprazole (PROTONIX) IV  40 mg Intravenous Q12H  . sucralfate  1 g Oral TID WC & HS   Continuous Infusions: . dextrose 5 % and 0.45 % NaCl with KCl 10 mEq/L 75 mL/hr at 08/21/17 1800  . ganciclovir (CYTOVENE) IV 155 mg (08/22/17 1022)     LOS: 4 days        Glade Lloyd, MD [facility] Pager [phone]  If 7PM-7AM, please contact night-coverage [email or web address] Password [facility] 08/22/2017, 10:44 AM

## 2017-08-23 ENCOUNTER — Encounter (HOSPITAL_COMMUNITY): Payer: Self-pay | Admitting: Gastroenterology

## 2017-08-23 DIAGNOSIS — E876 Hypokalemia: Secondary | ICD-10-CM

## 2017-08-23 DIAGNOSIS — D72819 Decreased white blood cell count, unspecified: Secondary | ICD-10-CM

## 2017-08-23 LAB — BASIC METABOLIC PANEL
Anion gap: 7 (ref 5–15)
BUN: <5 mg/dL — ABNORMAL LOW (ref 6–20)
CHLORIDE: 105 mmol/L (ref 101–111)
CO2: 25 mmol/L (ref 22–32)
Calcium: 8.5 mg/dL — ABNORMAL LOW (ref 8.9–10.3)
Creatinine, Ser: 0.56 mg/dL (ref 0.44–1.00)
GFR calc non Af Amer: >60 mL/min (ref >60)
Glucose, Bld: 114 mg/dL — ABNORMAL HIGH (ref 65–99)
POTASSIUM: 3.2 mmol/L — AB (ref 3.5–5.1)
SODIUM: 137 mmol/L (ref 135–145)

## 2017-08-23 LAB — CBC WITH DIFFERENTIAL/PLATELET
BASOS PCT: 0 %
Basophils Absolute: 0 10*3/uL (ref 0.0–0.1)
Eosinophils Absolute: 0.3 10*3/uL (ref 0.0–0.7)
Eosinophils Relative: 7 %
HCT: 31.9 % — ABNORMAL LOW (ref 36.0–46.0)
HEMOGLOBIN: 10.1 g/dL — AB (ref 12.0–15.0)
Lymphocytes Relative: 43 %
Lymphs Abs: 2 10*3/uL (ref 0.7–4.0)
MCH: 27.5 pg (ref 26.0–34.0)
MCHC: 31.7 g/dL (ref 30.0–36.0)
MCV: 86.9 fL (ref 78.0–100.0)
Monocytes Absolute: 0.6 10*3/uL (ref 0.1–1.0)
Monocytes Relative: 12 %
NEUTROS PCT: 38 %
Neutro Abs: 1.8 10*3/uL (ref 1.7–7.7)
Platelets: 408 10*3/uL — ABNORMAL HIGH (ref 150–400)
RBC: 3.67 MIL/uL — AB (ref 3.87–5.11)
RDW: 16.3 % — ABNORMAL HIGH (ref 11.5–15.5)
WBC: 4.6 10*3/uL (ref 4.0–10.5)

## 2017-08-23 LAB — MAGNESIUM: MAGNESIUM: 1.9 mg/dL (ref 1.7–2.4)

## 2017-08-23 MED ORDER — POTASSIUM CHLORIDE CRYS ER 20 MEQ PO TBCR
60.0000 meq | EXTENDED_RELEASE_TABLET | Freq: Once | ORAL | Status: AC
  Administered 2017-08-23: 60 meq via ORAL
  Filled 2017-08-23: qty 3

## 2017-08-23 MED ORDER — FUROSEMIDE 40 MG PO TABS
40.0000 mg | ORAL_TABLET | Freq: Every day | ORAL | Status: DC
  Administered 2017-08-23 – 2017-08-25 (×3): 40 mg via ORAL
  Filled 2017-08-23 (×3): qty 1

## 2017-08-23 NOTE — Progress Notes (Signed)
3 Days Post-Op    CC:  CMV Colitis  Subjective: No real change, still having multiple stools, she says she did not take the 2200 PO recorded yesterday, mostly milk based soups, a few grits this AM.  Not much protein. Still feels bloated and having loose stools.  Objective: Vital signs in last 24 hours: Temp:  [97.5 F (36.4 C)-97.9 F (36.6 C)] 97.8 F (36.6 C) (01/07 0450) Pulse Rate:  [82-94] 82 (01/07 0450) Resp:  [18] 18 (01/07 0450) BP: (160-177)/(75-82) 161/82 (01/07 0450) SpO2:  [97 %] 97 % (01/07 0450) Last BM Date: 08/22/17 2200 PO recorded 1127 IV Urine x 18 Stool x 7 Afebrile, VSS K+ 3.2 /Mag 1.9 WBC remains normal   Intake/Output from previous day: 01/06 0701 - 01/07 0700 In: 3327.9 [P.O.:2200; I.V.:1127.9] Out: 0  Intake/Output this shift: Total I/O In: 360 [P.O.:360] Out: -   General appearance: alert, cooperative and no distress GI: Soft, not really distended.  She says she feels bloated.  Continues to have multiple loose stools.  Lab Results:  Recent Labs    08/22/17 0421 08/23/17 0502  WBC 5.6 4.6  HGB 9.5* 10.1*  HCT 29.7* 31.9*  PLT 369 408*    BMET Recent Labs    08/22/17 0421 08/23/17 0502  NA 137 137  K 3.6 3.2*  CL 108 105  CO2 23 25  GLUCOSE 109* 114*  BUN <5* <5*  CREATININE 0.46 0.56  CALCIUM 8.3* 8.5*   PT/INR No results for input(s): LABPROT, INR in the last 72 hours.  Recent Labs  Lab 08/19/17 0421  AST 14*  ALT 12*  ALKPHOS 85  BILITOT 0.3  PROT 5.7*  ALBUMIN 2.5*     Lipase     Component Value Date/Time   LIPASE 26 05/16/2017 1745     Medications: . dronabinol  2.5 mg Oral BID AC  . furosemide  40 mg Intravenous Daily  . heparin  5,000 Units Subcutaneous Q8H  . pantoprazole (PROTONIX) IV  40 mg Intravenous Q12H  . sucralfate  1 g Oral TID WC & HS   Anti-infectives (From admission, onward)   Start     Dose/Rate Route Frequency Ordered Stop   08/19/17 1800  ganciclovir (CYTOVENE) 155 mg in  sodium chloride 0.9 % 100 mL IVPB     2.5 mg/kg  62.8 kg (Adjusted) 100 mL/hr over 60 Minutes Intravenous Every 12 hours 08/19/17 1703     08/19/17 1645  ganciclovir (CYTOVENE) 315 mg in sodium chloride 0.9 % 100 mL IVPB  Status:  Discontinued     5 mg/kg  62.8 kg (Adjusted) 100 mL/hr over 60 Minutes Intravenous Every 12 hours 08/19/17 1642 08/19/17 1703       . dextrose 5 % and 0.45 % NaCl with KCl 10 mEq/L 50 mL/hr at 08/22/17 1101  . ganciclovir (CYTOVENE) IV Stopped (08/23/17 0502)    Assessment/Plan Sigmoid colonic stricture/partial obstruction Active- CMV colitis Abdominal pain/weight loss  Hypertension History of CMV colitis. Permanent transvenous pacemaker-symptomatic secondary AV block/history of PAF Nonobstructive coronary artery disease.  FEN: IV fluids/full liquids ID:  Ganciclovir 1/3 =>> day 5/14 DVT: Heparin Foley: None  Follow-up: TBD   Plan: We will continue to follow.  I will ask nutrition to see and help her get some protein in with her full liquids.  See what supplements she likes and can tolerate.       LOS: 5 days    Geetika Laborde 08/23/2017 [phone]

## 2017-08-23 NOTE — Progress Notes (Signed)
Nutrition Follow-up  INTERVENTION:   Provide snacks in between meals to encourage small frequent meal intake RD will continue to monitor  NUTRITION DIAGNOSIS:   Inadequate oral intake related to nausea, diarrhea as evidenced by NPO status.  Now on dysphagia 3 diet  GOAL:   Patient will meet greater than or equal to 90% of their needs  Progressing.  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   Pt in room with son at bedside. Pt eating lunch of pasta with meat sauce and whole milk. Pt reports she needs to eat more protein but c/o early satiety and poor appetite. Pt does not like protein shakes such as Ensure/Boost. States her family pushed her to drink them but she does not like the taste.  Pt interested in receiving snacks in between meals. She likes cottage cheese, yogurts, etc. RD to place order.  Per pt's son, pt doesn't normally have a huge appetite but over the past couple of weeks her appetite has worsened.   Medications: Marinol capsule BID, Lasix tablet daily, IV Protonix every 12 hours, Carafate suspension QID Labs reviewed: Low Na Mg WNL  Diet Order:  DIET DYS 3 Room service appropriate? Yes; Fluid consistency: Thin  EDUCATION NEEDS:   Education needs have been addressed  Skin:  Skin Assessment: Reviewed RN Assessment  Last BM:  1/6  Height:   Ht Readings from Last 1 Encounters:  08/18/17 5\' 4"  (1.626 m)    Weight:   Wt Readings from Last 1 Encounters:  08/18/17 165  lb 5.5 oz (75 kg)    Ideal Body Weight:  54.5 kg  BMI:  Body mass index is 28.38 kg/m.  Estimated Nutritional Needs:   Kcal:  1700-1900  Protein:  70-80g  Fluid:  1.9L/day  Tilda FrancoLindsey Verania Salberg, MS, RD, LDN [facility] Clinical Dietitian Pager: [phone] After Hours Pager: [phone]

## 2017-08-23 NOTE — Care Management Important Message (Signed)
Important Message  Patient Details  Name: Sharon Gilmore MRN: [id number] Date of Birth: 01/22/1934   Medicare Important Message Given:  Yes    Caren MacadamFuller, Troi Florendo 08/23/2017, 11:20 AMImportant Message  Patient Details  Name: Sharon Gilmore MRN: [id number] Date of Birth: 01/22/1934   Medicare Important Message Given:  Yes    Caren MacadamFuller, Aujanae Mccullum 08/23/2017, 11:20 AM

## 2017-08-23 NOTE — Progress Notes (Signed)
Patient ID: Sharon Gilmore, female   DOB: 24-Mar-1934, 84 y.o.   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  General surgery was consulted.  ID was also consulted.   Assessment & Plan:   Active Problems:   Abdominal pain   Essential hypertension   History of CMV colitis (HCC)   Cardiac pacemaker in situ   Weight loss, non-intentional   Colonic stricture (HCC)   Sigmoid Colonic stricture with partial colonic obstruction -General surgery following.  Currently being medically managed.   -Status post sigmoidoscopy on 08/20/2017 which showed diffuse severe inflammation in the rectosigmoid colon.  Pathology pending.  GI following  History of recent CMV colitis -ID following.  Currently on gancyclovir.  Monitor renal function and CBC  Hypertension -Monitor blood pressure.  Blood pressure on the higher side.  Change Lasix to oral  History of second-degree AV block status post pacemaker placement  -outpatient follow-up with electrophysiology  Chronic diastolic dysfunction -Outpatient follow-up with cardiology.  Change Lasix to oral.  Strict and output.  Daily weights  Mild hypokalemia -Replace.  Repeat a.m. labs    DVT prophylaxis: Lovenox Code Status: Full Family Communication: None at bedside Disposition Plan: Depends on clinical outcome  Consultants: General surgery, GI, ID  Procedures: Sigmoidoscopy on 08/20/2017  Antimicrobials: Ganciclovir   Subjective: Patient seen and examined at  bedside.  She is having very small bowel movements.  No current nausea or vomiting.  Still complains of abdominal pressure.  Objective: Vitals:   08/22/17 0444 08/22/17 1324 08/22/17 2119 08/23/17 0450  BP: (!) 184/79 (!) 160/75 (!) 177/80 (!) 161/82  Pulse: 77 94 94 82  Resp: 18 18 18 18   Temp: 97.7 F (36.5 C) (!) 97.5 F (36.4 C) 97.9 F (36.6 C) 97.8 F (36.6 C)  TempSrc: Oral Oral Oral Oral  SpO2: 98% 97% 97% 97%  Weight:      Height:        Intake/Output Summary (Last 24 hours) at 08/23/2017 1049 Last data filed at 08/23/2017 1000 Gross per 24 hour  Intake 2487.92 ml  Output 0 ml  Net 2487.92 ml   Filed Weights   08/18/17 2047  Weight: 75 kg (165 lb 5.5 oz)    Examination:  General exam: No distress.   Respiratory system: Bilateral decreased breath sound at bases Cardiovascular system: S1 & S2 heard, rate controlled  gastrointestinal system: Abdomen is distended, soft and nontender.  Bowel sounds sluggish Extremities: No cyanosis, clubbing, edema    Data Reviewed: I have personally reviewed following labs and imaging studies  CBC: Recent Labs  Lab 08/18/17 1740 08/19/17 0421 08/21/17 0528 08/22/17 0421 08/23/17 0502  WBC 7.6 7.5 4.7 5.6 4.6  NEUTROABS 4.8  --  1.9 2.3 1.8  HGB 9.7* 9.3* 9.2* 9.5* 10.1*  HCT 31.1* 29.1* 29.0* 29.7* 31.9*  MCV 87.9 87.9 87.3 87.6 86.9  PLT 370 364 315 369 408*   Basic Metabolic Panel: Recent Labs  Lab 08/19/17 0421 08/20/17 0403 08/21/17 0528 08/22/17 0421 08/23/17 0502  NA 136 133* 137 137 137  K 3.6  3.4* 3.4* 3.6 3.2*  CL 106 104 109 108 105  CO2 25 24 24 23 25   GLUCOSE 113* 122* 111* 109* 114*  BUN 6 6 <5* <5* <5*  CREATININE 0.45 0.52 0.50 0.46 0.56  CALCIUM 8.3* 8.5* 8.2* 8.3* 8.5*  MG  --  1.8 2.0 1.9 1.9   GFR: Estimated Creatinine Clearance: 54.7 mL/min (by C-G formula based on SCr of 0.56 mg/dL). Liver Function Tests: Recent Labs  Lab 08/19/17 0421  AST 14*  ALT 12*  ALKPHOS 85  BILITOT 0.3    PROT 5.7*  ALBUMIN 2.5*   No results for input(s): LIPASE, AMYLASE in the last 168 hours. No results for input(s): AMMONIA in the last 168 hours. Coagulation Profile: No results for input(s): INR, PROTIME in the last 168 hours. Cardiac Enzymes: No results for input(s): CKTOTAL, CKMB, CKMBINDEX, TROPONINI in the last 168 hours. BNP (last 3 results) Recent Labs    05/24/17 1150  PROBNP 397   HbA1C: No results for input(s): HGBA1C in the last 72 hours. CBG: No results for input(s): GLUCAP in the last 168 hours. Lipid Profile: No results for input(s): CHOL, HDL, LDLCALC, TRIG, CHOLHDL, LDLDIRECT in the last 72 hours. Thyroid Function Tests: No results for input(s): TSH, T4TOTAL, FREET4, T3FREE, THYROIDAB in the last 72 hours. Anemia Panel: No results for input(s): VITAMINB12, FOLATE, FERRITIN, TIBC, IRON, RETICCTPCT in the last 72 hours. Sepsis Labs: No results for input(s): PROCALCITON, LATICACIDVEN in the last 168 hours.  Recent Results (from the past 240 hour(s))  Cytomegalovirus (CMV) Culture     Status: None   Collection Time: 08/20/17  2:33 PM  Result Value Ref Range Status   Cytomegalovirus (CMV) Culture Comment  Final    Comment: (NOTE) Preliminary Report: No Cytomegalovirus isolated at 24 hours. Next report to follow after 1 week. Performed At: [facility] [address] [place], [place] [id number] Jolene SchimkeNagendra Sanjai MD WU:[id number]:[phone]    Source of Sample RECTUM  Final         Radiology Studies: No results found.      Scheduled Meds: . dronabinol  2.5 mg Oral BID AC  . furosemide  40 mg Intravenous Daily  . heparin  5,000 Units Subcutaneous Q8H  . pantoprazole (PROTONIX) IV  40 mg Intravenous Q12H  . sucralfate  1 g Oral TID WC & HS   Continuous Infusions: . dextrose 5 % and 0.45 % NaCl with KCl 10 mEq/L 50 mL/hr at 08/22/17 1101  . ganciclovir (CYTOVENE) IV Stopped (08/23/17 0502)     LOS: 5 days        Glade LloydKshitiz Leonela Kivi, MD [facility] Pager [phone]  If 7PM-7AM, please contact night-coverage [email or web address] Password [email or web address] 08/23/2017, 10:49 AM

## 2017-08-23 NOTE — Progress Notes (Addendum)
Stable overall.  Sigmoidoscopic findings reviewed.  It appears that both stricturing and active inflammation are present.  CMV cultures were negative at 24 hours.  Biopsies are pending at present.    In the meantime, the patient is being treated empirically with ganciclovir.  It is difficult to assess the degree of the patient's improvement.  Her stool output is much less, and is soft rather than liquid in character.  However, to some degree this may be a reflection of the fact that she is not enjoying the full liquid diet at all, and therefore, has substantially reduced oral intake compared to prior to admission.  Exam: The patient is cognitively very sharp, no evident distress.  Abdomen mildly protuberant and mildly tympanitic, but soft and completely nontender, without mass-effect.  Impression:  1.  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  Stricture in the region of the rectosigmoid junction  Plan:  1.  Await pathology results 2.  Will advance to mechanical soft diet per patient's request, primarily for the purpose of food preferences 3.  Continue current medical management  Florencia Reasonsobert V. Rasheem Figiel, M.D. Pager [phone] If no answer or after 5 PM call [phone]

## 2017-08-23 NOTE — Progress Notes (Signed)
    [facility]    Date of Admission:  08/18/2017   Total days of antibiotics 5        Day 5 gangiciclovir           ID: Sharon Gilmore is a 84 y.o. 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 Not straining, not bloody nor having any liquid stools. Her diet has been liberated today to see how it affects her BM. No fever ,chills, nightsweats. Still fatigued  Medications:  . dronabinol  2.5 mg Oral BID AC  . furosemide  40 mg Oral Daily  . heparin  5,000 Units Subcutaneous Q8H  . pantoprazole (PROTONIX) IV  40 mg Intravenous Q12H  . sucralfate  1 g Oral TID WC & HS    Objective: Vital signs in last 24 hours: Temp:  [97.8 F (36.6 C)-98.9 F (37.2 C)] 98.9 F (37.2 C) (01/07 1403) Pulse Rate:  [82-97] 97 (01/07 1403) Resp:  [17-18] 17 (01/07 1403) BP: (145-177)/(80-94) 145/94 (01/07 1403) SpO2:  [97 %-98 %] 98 % (01/07 1403) Physical Exam  Constitutional:  oriented to person, place, and time. appears well-developed and well-nourished. No distress.  HENT: [place]/AT, PERRLA, no scleral icterus Mouth/Throat: Oropharynx is clear and moist. No oropharyngeal exudate.   Neck = supple, no nuchal rigidity Abdominal: Soft. Bowel sounds are normal. mildly distension. There is no tenderness.  Skin: Skin is warm and dry. No rash noted. No erythema.  Psychiatric: a normal mood and affect.  behavior is normal.    Lab Results Recent Labs    08/22/17 0421 08/23/17 0502  WBC 5.6 4.6  HGB 9.5* 10.1*  HCT 29.7* 31.9*  NA 137 137  K 3.6 3.2*  CL 108 105  CO2 23 25  BUN <5* <5*  CREATININE 0.46 0.56    Microbiology: CMV blood VL is negative Studies/Results: No results  found.   Assessment/Plan: Colitis, presumably ongoing CMV disease  - continue on IV GCV - awaiting path results to see if inclusion bodies still present- if + for inclusion bodies, then this is still likely CMV disease - will need picc line to treat for 14 days with GCV   Leukopenia - please check CBC with diff tomorrow to see if need to do any dose adjustment on GCV  Hypokalemia - getting oral and IV supplementation  [facility] Khadeejah Castner [facility] Cell: [phone] Pager: [phone]  08/23/2017, 2:05 PM

## 2017-08-24 LAB — CBC WITH DIFFERENTIAL/PLATELET
BASOS PCT: 0 %
Basophils Absolute: 0 10*3/uL (ref 0.0–0.1)
Eosinophils Absolute: 0.4 10*3/uL (ref 0.0–0.7)
Eosinophils Relative: 5 %
HEMATOCRIT: 32.1 % — AB (ref 36.0–46.0)
Hemoglobin: 10.2 g/dL — ABNORMAL LOW (ref 12.0–15.0)
Lymphocytes Relative: 42 %
Lymphs Abs: 2.7 10*3/uL (ref 0.7–4.0)
MCH: 27.6 pg (ref 26.0–34.0)
MCHC: 31.8 g/dL (ref 30.0–36.0)
MCV: 86.8 fL (ref 78.0–100.0)
Monocytes Absolute: 0.6 10*3/uL (ref 0.1–1.0)
Monocytes Relative: 10 %
NEUTROS ABS: 2.8 10*3/uL (ref 1.7–7.7)
Neutrophils Relative %: 43 %
Platelets: 381 10*3/uL (ref 150–400)
RBC: 3.7 MIL/uL — ABNORMAL LOW (ref 3.87–5.11)
RDW: 16.5 % — ABNORMAL HIGH (ref 11.5–15.5)
WBC: 6.5 10*3/uL (ref 4.0–10.5)

## 2017-08-24 LAB — BASIC METABOLIC PANEL
Anion gap: 7 (ref 5–15)
BUN: 7 mg/dL (ref 6–20)
CALCIUM: 8.6 mg/dL — AB (ref 8.9–10.3)
CO2: 22 mmol/L (ref 22–32)
Chloride: 105 mmol/L (ref 101–111)
Creatinine, Ser: 0.58 mg/dL (ref 0.44–1.00)
GFR calc non Af Amer: >60 mL/min (ref >60)
GLUCOSE: 107 mg/dL — AB (ref 65–99)
Potassium: 4.1 mmol/L (ref 3.5–5.1)
Sodium: 134 mmol/L — ABNORMAL LOW (ref 135–145)

## 2017-08-24 LAB — MAGNESIUM: Magnesium: 1.9 mg/dL (ref 1.7–2.4)

## 2017-08-24 MED ORDER — PANTOPRAZOLE SODIUM 40 MG PO TBEC
40.0000 mg | DELAYED_RELEASE_TABLET | Freq: Two times a day (BID) | ORAL | Status: DC
  Administered 2017-08-24 – 2017-08-25 (×3): 40 mg via ORAL
  Filled 2017-08-24 (×3): qty 1

## 2017-08-24 MED ORDER — SODIUM CHLORIDE 0.9% FLUSH: 10.0000 mL | INTRAVENOUS | Status: DC | PRN

## 2017-08-24 NOTE — Progress Notes (Signed)
Will place picc line for IV ganciclovir treatment for CMV colitis. Plan to do 14 days. Appears to tolerate current dose. Currently on day 6 of 14.  Duke Salviaynthia B. Drue SecondSnider MD MPH [facility] [phone]

## 2017-08-24 NOTE — Progress Notes (Signed)
Biopsies from the patient sigmoidoscopy last Friday show inflammation and ulceration which is nonspecific in character.  I have spoken with the pathologist, and have requested specific immune staining to check for the presence of CMV.  Meanwhile, the patient seems to be generally improved, on intravenous ganciclovir therapy.  Yesterday and today, her appetite has returned, and she is eating "twice as much" as she was before she came into the hospital.  Her diarrhea is less frequent, although it is still quite urgent.  Furthermore, the stools are less liquid in character.  On exam, the patient appears somewhat chronically ill and washed out, but is very alert and appropriate.  The abdomen is without any evident tenderness.  Impression: Persistent colitis, with improvement in symptoms since resumption of therapy for CMV.  Plan:  1.  Continue current management for now, in view of clinical improvement on the current regimen 2.  Await special stains checking for CMV  Florencia Reasonsobert V. Tom Macpherson, M.D. Pager [phone] If no answer or after 5 PM call [phone]

## 2017-08-24 NOTE — Progress Notes (Signed)
Patient ID: Sharon Gilmore, female   DOB: 1934/05/13, 84 y.o.   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  General surgery was consulted.  ID was also consulted, patient was started on intravenous ganciclovir.   Assessment & Plan:   Active Problems:   Abdominal pain   Essential hypertension   History of CMV colitis (HCC)   Cardiac pacemaker in situ   Weight loss, non-intentional   Colonic stricture (HCC)   Sigmoid Colonic stricture with partial colonic obstruction -General surgery following.  Currently being medically managed.   -Status post sigmoidoscopy on 08/20/2017 which showed diffuse severe inflammation in the rectosigmoid colon.  Pathology pending.  GI following  History of recent CMV colitis -ID following.  Currently on gancyclovir.  Monitor renal function and CBC  Hypertension -Monitor blood pressure.  Blood pressure on the higher side.  Continue oral Lasix  History of second-degree AV block status post pacemaker placement  -outpatient follow-up with electrophysiology  Chronic diastolic dysfunction -Outpatient follow-up with cardiology.  Strict and output.  Daily weights  Mild hypokalemia -Resolved    DVT prophylaxis: Lovenox Code Status: Full Family Communication: None at bedside Disposition Plan: Depends on clinical outcome  Consultants: General surgery, GI, ID  Procedures: Sigmoidoscopy on 08/20/2017  Antimicrobials: Ganciclovir   Subjective: Patient seen and examined  at bedside.  She is tolerating her diet better but still is having very small bowel movements.  No current nausea or vomiting.  No overnight fever.  Objective: Vitals:   08/22/17 2119 08/23/17 0450 08/23/17 1403 08/23/17 2110  BP: (!) 177/80 (!) 161/82 (!) 145/94 (!) 154/67  Pulse: 94 82 97 92  Resp: 18 18 17 18   Temp: 97.9 F (36.6 C) 97.8 F (36.6 C) 98.9 F (37.2 C) (!) 97.5 F (36.4 C)  TempSrc: Oral Oral Oral Oral  SpO2: 97% 97% 98% 98%  Weight:      Height:        Intake/Output Summary (Last 24 hours) at 08/24/2017 1208 Last data filed at 08/24/2017 1000 Gross per 24 hour  Intake 480 ml  Output 0 ml  Net 480 ml   Filed Weights   08/18/17 2047  Weight: 75 kg (165 lb 5.5 oz)    Examination:  General exam: No distress.  Alert, calm and comfortable Respiratory system: Bilateral decreased breath sound at bases Cardiovascular system: S1 & S2 heard, rate controlled  gastrointestinal system: Abdomen is distended, soft and nontender.  Bowel sounds present Extremities: No cyanosis, clubbing, edema    Data Reviewed: I have personally reviewed following labs and imaging studies  CBC: Recent Labs  Lab 08/18/17 1740 08/19/17 0421 08/21/17 0528 08/22/17 0421 08/23/17 0502 08/24/17 0436  WBC 7.6 7.5 4.7 5.6 4.6 6.5  NEUTROABS 4.8  --  1.9 2.3 1.8 2.8  HGB 9.7* 9.3* 9.2* 9.5* 10.1* 10.2*  HCT 31.1* 29.1* 29.0* 29.7* 31.9* 32.1*  MCV 87.9 87.9 87.3 87.6 86.9 86.8  PLT 370 364 315 369 408* 381   Basic Metabolic Panel: Recent Labs  Lab 08/20/17 0403 08/21/17 0528 08/22/17 0421 08/23/17 0502  08/24/17 0436  NA 133* 137 137 137 134*  K 3.4* 3.4* 3.6 3.2* 4.1  CL 104 109 108 105 105  CO2 24 24 23 25 22   GLUCOSE 122* 111* 109* 114* 107*  BUN 6 <5* <5* <5* 7  CREATININE 0.52 0.50 0.46 0.56 0.58  CALCIUM 8.5* 8.2* 8.3* 8.5* 8.6*  MG 1.8 2.0 1.9 1.9 1.9   GFR: Estimated Creatinine Clearance: 54.7 mL/min (by C-G formula based on SCr of 0.58 mg/dL). Liver Function  Tests: Recent Labs  Lab 08/19/17 0421  AST 14*  ALT 12*  ALKPHOS 85  BILITOT 0.3  PROT 5.7*  ALBUMIN 2.5*   No results for input(s): LIPASE, AMYLASE in the last 168 hours. No results for input(s): AMMONIA in the last 168 hours. Coagulation Profile: No results for input(s): INR, PROTIME in the last 168 hours. Cardiac Enzymes: No results for input(s): CKTOTAL, CKMB, CKMBINDEX, TROPONINI in the last 168 hours. BNP (last 3 results) Recent Labs    05/24/17 1150  PROBNP 397   HbA1C: No results for input(s): HGBA1C in the last 72 hours. CBG: No results for input(s): GLUCAP in the last 168 hours. Lipid Profile: No results for input(s): CHOL, HDL, LDLCALC, TRIG, CHOLHDL, LDLDIRECT in the last 72 hours. Thyroid Function Tests: No results for input(s): TSH, T4TOTAL, FREET4, T3FREE, THYROIDAB in the last 72 hours. Anemia Panel: No results for input(s): VITAMINB12, FOLATE, FERRITIN, TIBC, IRON, RETICCTPCT in the last 72 hours. Sepsis Labs: No results for input(s): PROCALCITON, LATICACIDVEN in the last 168 hours.  Recent Results (from the past 240 hour(s))  Cytomegalovirus (CMV) Culture     Status: None   Collection Time: 08/20/17  2:33 PM  Result Value Ref Range Status   Cytomegalovirus (CMV) Culture Comment  Final    Comment: (NOTE) Preliminary Report: No Cytomegalovirus isolated at 24 hours. Next report to follow after 1 week. Performed At: [facility] [address] [place], [place] [id number] Jolene SchimkeNagendra Sanjai MD WU:[phone]    Source of Sample RECTUM  Final         Radiology Studies: No results found.      Scheduled Meds: . dronabinol  2.5 mg Oral BID AC  . furosemide  40 mg Oral Daily  . heparin  5,000 Units Subcutaneous Q8H  . pantoprazole  40 mg Oral BID  . sucralfate  1 g Oral TID WC & HS   Continuous Infusions: . dextrose 5 % and 0.45 % NaCl with KCl 10 mEq/L 50 mL/hr at 08/24/17 1141  . ganciclovir (CYTOVENE) IV 155 mg (08/24/17 0540)      LOS: 6 days        Glade LloydKshitiz Mikalia Fessel, MD [facility] Pager [phone]  If 7PM-7AM, please contact night-coverage [email or web address] Password [email or web address] 08/24/2017, 12:08 PM

## 2017-08-24 NOTE — Progress Notes (Signed)
Peripherally Inserted Central Catheter/Midline Placement  The IV Nurse has discussed with the patient and/or persons authorized to consent for the patient, the purpose of this procedure and the potential benefits and risks involved with this procedure.  The benefits include less needle sticks, lab draws from the catheter, and the patient may be discharged home with the catheter. Risks include, but not limited to, infection, bleeding, blood clot (thrombus formation), and puncture of an artery; nerve damage and irregular heartbeat and possibility to perform a PICC exchange if needed/ordered by physician.  Alternatives to this procedure were also discussed.  Bard Power PICC patient education guide, fact sheet on infection prevention and patient information card has been provided to patient /or left at bedside.    PICC/Midline Placement Documentation  PICC Single Lumen 08/24/17 PICC Right Brachial 37 cm 0 cm (Active)  Indication for Insertion or Continuance of Line Prolonged intravenous therapies 08/24/2017  8:36 PM  Exposed Catheter (cm) 0 cm 08/24/2017  8:36 PM  Site Assessment Clean;Dry;Intact 08/24/2017  8:36 PM  Line Status Flushed;Saline locked;Blood return noted 08/24/2017  8:36 PM  Dressing Type Transparent 08/24/2017  8:36 PM  Dressing Status Clean;Dry;Intact;Antimicrobial disc in place 08/24/2017  8:36 PM  Dressing Change Due 08/31/17 08/24/2017  8:36 PM       Kenzee Bassin, Lajean ManesKerry Loraine 08/24/2017, 8:37 PM

## 2017-08-25 DIAGNOSIS — E876 Hypokalemia: Secondary | ICD-10-CM

## 2017-08-25 DIAGNOSIS — I5032 Chronic diastolic (congestive) heart failure: Secondary | ICD-10-CM

## 2017-08-25 DIAGNOSIS — Z950 Presence of cardiac pacemaker: Secondary | ICD-10-CM

## 2017-08-25 DIAGNOSIS — D72819 Decreased white blood cell count, unspecified: Secondary | ICD-10-CM

## 2017-08-25 LAB — BASIC METABOLIC PANEL
Anion gap: 6 (ref 5–15)
BUN: 7 mg/dL (ref 6–20)
CO2: 26 mmol/L (ref 22–32)
CREATININE: 0.52 mg/dL (ref 0.44–1.00)
Calcium: 8.3 mg/dL — ABNORMAL LOW (ref 8.9–10.3)
Chloride: 104 mmol/L (ref 101–111)
Glucose, Bld: 120 mg/dL — ABNORMAL HIGH (ref 65–99)
Potassium: 3.5 mmol/L (ref 3.5–5.1)
SODIUM: 136 mmol/L (ref 135–145)

## 2017-08-25 LAB — CBC WITH DIFFERENTIAL/PLATELET
BASOS PCT: 0 %
Basophils Absolute: 0 10*3/uL (ref 0.0–0.1)
EOS ABS: 0.5 10*3/uL (ref 0.0–0.7)
EOS PCT: 6 %
HCT: 31.9 % — ABNORMAL LOW (ref 36.0–46.0)
Hemoglobin: 10.1 g/dL — ABNORMAL LOW (ref 12.0–15.0)
LYMPHS ABS: 2.2 10*3/uL (ref 0.7–4.0)
Lymphocytes Relative: 29 %
MCH: 27.4 pg (ref 26.0–34.0)
MCHC: 31.7 g/dL (ref 30.0–36.0)
MCV: 86.7 fL (ref 78.0–100.0)
MONOS PCT: 8 %
Monocytes Absolute: 0.6 10*3/uL (ref 0.1–1.0)
Neutro Abs: 4.5 10*3/uL (ref 1.7–7.7)
Neutrophils Relative %: 57 %
PLATELETS: 361 10*3/uL (ref 150–400)
RBC: 3.68 MIL/uL — ABNORMAL LOW (ref 3.87–5.11)
RDW: 16.6 % — ABNORMAL HIGH (ref 11.5–15.5)
WBC: 7.8 10*3/uL (ref 4.0–10.5)

## 2017-08-25 LAB — MAGNESIUM: MAGNESIUM: 1.8 mg/dL (ref 1.7–2.4)

## 2017-08-25 MED ORDER — PANTOPRAZOLE SODIUM 40 MG PO TBEC: 40.0000 mg | DELAYED_RELEASE_TABLET | Freq: Two times a day (BID) | ORAL | 0 refills | Status: AC

## 2017-08-25 MED ORDER — HEPARIN SOD (PORK) LOCK FLUSH 100 UNIT/ML IV SOLN
250.0000 [IU] | INTRAVENOUS | Status: AC | PRN
  Administered 2017-08-25: 250 [IU]

## 2017-08-25 MED ORDER — SODIUM CHLORIDE 0.9 % IV SOLN: 155.0000 mg | Freq: Two times a day (BID) | INTRAVENOUS | 0 refills | Status: AC

## 2017-08-25 MED ORDER — SUCRALFATE 1 GM/10ML PO SUSP: 1.0000 g | Freq: Three times a day (TID) | ORAL | 0 refills | Status: DC

## 2017-08-25 MED ORDER — DRONABINOL 2.5 MG PO CAPS: 2.5000 mg | ORAL_CAPSULE | Freq: Two times a day (BID) | ORAL | 0 refills | Status: AC

## 2017-08-25 NOTE — Progress Notes (Signed)
PHARMACY CONSULT NOTE FOR:  OUTPATIENT  PARENTERAL ANTIBIOTIC THERAPY (OPAT)  Indication: CMV colitis Regimen: ganciclovir 155mg  IV q12h End date: 09/01/17  IV antibiotic discharge orders are pended. To discharging provider:  please sign these orders via discharge navigator,  Select New Orders & click on the button choice - Manage This Unsigned Work.     Thank you for allowing pharmacy to be a part of this patient's care.  Loralee PacasErin Cadon Raczka, PharmD, BCPS Pager: [phone] 08/25/2017, 2:43 PM

## 2017-08-25 NOTE — Progress Notes (Signed)
Patient aware of pending DC to [facility]. DC instructions and prescriptions placed in packet for SNF staff. Awaiting IV team to disconnect and flush PICC line. Patient states "I'm happy to be going to the next level". Questions answered. Son en route to drive patient to SNF. Lina SarBeth Marcio Hoque, RN

## 2017-08-25 NOTE — Progress Notes (Signed)
    [facility]    Date of Admission:  08/18/2017   Total days of antibiotics 7        Day 7 ganciclovir          ID: Sharon Gilmore is a 84 y.o. female with CMV colitis   Active Problems:   Abdominal pain   Essential hypertension   History of CMV colitis (HCC)   Cardiac pacemaker in situ   Weight loss, non-intentional   Colonic stricture (HCC)    Subjective: She is still having loose stools but not as frequent. No abdominal cramping or blood in stool but does have urgency. Overall feels fatigued  Medications:  . dronabinol  2.5 mg Oral BID AC  . furosemide  40 mg Oral Daily  . heparin  5,000 Units Subcutaneous Q8H  . pantoprazole  40 mg Oral BID  . sucralfate  1 g Oral TID WC & HS    Objective: Vital signs in last 24 hours: Temp:  [97.7 F (36.5 C)-98.2 F (36.8 C)] 97.7 F (36.5 C) (01/09 0648) Pulse Rate:  [87-93] 87 (01/09 0648) Resp:  [18] 18 (01/09 0648) BP: (152-153)/(70-75) 153/75 (01/09 0648) SpO2:  [100 %] 100 % (01/09 09810648)  Physical Exam  Constitutional:  oriented to person, place, and time. appears well-developed and well-nourished. No distress.  HENT: [place]/AT, PERRLA, no scleral icterus Mouth/Throat: Oropharynx is clear and moist. No oropharyngeal exudate.  Cardiovascular: Normal rate, regular rhythm and normal heart sounds. Exam reveals no gallop and no friction rub.  No murmur heard.  Pulmonary/Chest: Effort normal and breath sounds normal. No respiratory distress.  has no wheezes.  Neck = supple, no nuchal rigidity Abdominal: Soft. Bowel sounds are normal.  mildly distented. There is no tenderness.  Lymphadenopathy: no cervical adenopathy. No axillary adenopathy Neurological: alert and oriented to person, place, and time.  Skin: Skin is warm and dry. No rash noted. No erythema.  Psychiatric: a normal mood and affect.  behavior is normal.    Lab Results Recent Labs    08/24/17 0436 08/25/17 0322  WBC 6.5 7.8  HGB  10.2* 10.1*  HCT 32.1* 31.9*  NA 134* 136  K 4.1 3.5  CL 105 104  CO2 22 26  BUN 7 7  CREATININE 0.58 0.52    Microbiology: cmv stool cx negative cmv viral load negative Path chronic inflammation but negative cmv stains Studies/Results: No results found.   Assessment/Plan: cmv colitis = though path is negative for CMV stains, will continue with ganciclovir 155mg  IV Q 12hr (dosed renally) for a total 14 days for induction since appears to have some response to treatment. We will continue with 7 additional days of IV therapy. She will need twice a week cbc with diff, while on IV therapy. We will see her back in clinic next week to decide to transition to oral therapy.  We will have her follow up with the ID clinic either dr comer or myself  Have results faxed to dr comer at [phone]  [facility] CenterCynthia Anastasia Gil[facility] Cell: [phone] Pager: [phone]  08/25/2017, 2:43 PM

## 2017-08-25 NOTE — Discharge Summary (Signed)
Discharge Summary  Sharon Gilmore XIP:[id number] DOB: 07/04/1934  PCP: Sharon Anchors, MD  Admit date: 08/18/2017 Discharge date: 08/25/2017  Time spent: >73mns, more than 50% time spent on coordination of care  Recommendations for Outpatient Follow-up:  1. F/u with SNF MD  for hospital discharge follow up, repeat cbc/bmp at follow up. 2. F/u with GI Dr BCristina Gongin two weeks for colitis 3. Continue iv ganciclovir for 7 more days to finish total of 14days treatment. Remove picc line when no need of long term iv access. To be reassessed by SNF MD. 4. Need cbc twice a week while on ganciclovir  Discharge Diagnoses:  Active Hospital Problems   Diagnosis Date Noted  . Colonic stricture ([place] 08/18/2017  . Weight loss, non-intentional 07/05/2017  . Cardiac pacemaker in situ 05/24/2017  . 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 Essential hypertension 03/08/2017  . Abdominal pain 01/04/2017    Resolved Hospital Problems  No resolved problems to display.    Discharge Condition: stable  Diet recommendation: regular diet  Filed Weights   08/18/17 2047  Weight: 75 kg (165 lb 5.5 oz)    History of present illness:  PCP: KIvan Anchors MD  Patient coming from: home  I have personally briefly [place] Chief Complaint:   HPI: Sharon Gilmore with medical history significant of spinal fusion surgery back in October recently diagnosed with CMV colitis with sigmoidoscopy several weeks ago which showed multiple colonic ulcerations with biopsy-proven CMV, referred to the infectious disease clinic  which completed her treatment with valacyclovir there was some improvement in her diarrhea as her stools were more formed but still going about 8-9 times a day in small amounts, with a negative C. difficile colitis PCR, who had a colonoscopy and EGD on 08/18/2017, the EGD showed bilious gastric fluid and some erythema and  colonoscopy showed strictures approximately 15 cm from anus unable to trans-first with an ultrasound Slim colonoscope, so we were consulted for further evaluation and treatment.  He also relates she continues to have persistent nausea but no vomiting.  And some abdominal cramping.    Hospital Course:  Active Problems:   Abdominal pain   Essential hypertension   [place] of CMV colitis ([place]   Cardiac pacemaker in situ   Weight loss, non-intentional   Colonic stricture (HCC)   Sigmoid Colonic stricture with partial colonic obstruction, cmv colitis -Status post sigmoidoscopy on 08/20/2017 which showed diffuse severe inflammation in the rectosigmoid colon. -pathology no malignancy, no cmv but limited time due to small scope used for procedure.  -she is improving, no n/v, no ab  Pain, she tolerate dietpatient is clinically improving on iv gancyclovir, the consensus from ID and gi is to continue finish total of 14days treatment and outpatient follow up with GI Dr BCristina Gong-she is continued on ppi and sucralfate for now, Dr BCristina Gongwill follow up on this. Case discussed with gi and ID prior to discharge.   Hypertension -Monitor blood pressure.  Blood pressure on the higher side.  Continue oral Lasix  History of second-degree AV block status post pacemaker placement  -outpatient follow-up with electrophysiology  Chronic diastolic dysfunction -Outpatient follow-up with cardiology.  Strict and output.  Daily weights -she is on lasix chronically -euvolemic at discharge  Mild hypokalemia -Resolved, she is discharged on potassium supplement  repeat labs at hospital discharge follow up.    DVT prophylaxis while  in the hospital: heparin Code Status: Full Family Communication: None at bedside Disposition Plan: SNF  Consultants: [facility] surgery, [facility] (Dr Cristina Gong) , ID (Dr Baxter Flattery)  Procedures: Sigmoidoscopy on 08/20/2017  Antimicrobials: Ganciclovir    Discharge  Exam: BP (!) 153/75 (BP Location: Right Arm)   Pulse 87   Temp 97.7 F (36.5 C) (Oral)   Resp 18   Ht 5' 4"  (1.626 m)   Wt 75 kg (165 lb 5.5 oz)   SpO2 100%   BMI 28.38 kg/m   General: NAD Cardiovascular: RRR Respiratory: CTABL  Discharge Instructions You were cared for by a hospitalist during your hospital stay. If you have any questions about your discharge medications or the care you received while you were in the hospital after you are discharged, you can call the unit and asked to speak with the hospitalist on call if the hospitalist that took care of you is not available. Once you are discharged, your primary care physician will handle any further medical issues. Please note that NO REFILLS for any discharge medications will be authorized once you are discharged, as it is imperative that you return to your primary care physician (or establish a relationship with a primary care physician if you do not have one) for your aftercare needs so that they can reassess your need for medications and monitor your lab values.  Discharge Instructions    Diet general   Complete by:  As directed    Home infusion instructions [facility] May follow [place] Dosing Protocol; May administer Cathflo as needed to maintain patency of vascular access device.; Flushing of vascular access device: per [facility] Protocol: 0.9% NaCl pre/post medica...   Complete by:  As directed    Instructions:  May follow [place] Dosing Protocol   Instructions:  May administer Cathflo as needed to maintain patency of vascular access device.   Instructions:  Flushing of vascular access device: per [facility] Protocol: 0.9% NaCl pre/post medication administration and prn patency; Heparin 100 u/ml, 74m for implanted ports and Heparin 10u/ml, 563mfor all other central venous catheters.   Instructions:  May follow [facility] Anaphylaxis Protocol for First Dose Administration in the home: 0.9% NaCl at 25-50 ml/hr to maintain IV access  for protocol meds. Epinephrine 0.3 ml IV/IM PRN and Benadryl 25-50 IV/IM PRN s/s of anaphylaxis.   Instructions:  AdQuesadanfusion Coordinator (RN) to assist per patient IV care needs in the home PRN.   Increase activity slowly   Complete by:  As directed      Allergies as of 08/25/2017      Reactions   Celebrex [celecoxib] Other (See Comments)   LEG ACHING   Codeine Nausea And Vomiting   Cymbalta [duloxetine Hcl] Other (See Comments)   [facility] Doxycycline Swelling, Other (See Comments)   Facial swelling   Meloxicam Other (See Comments)   TUMMY ACHE, INEFFECTIVE   Nortriptyline Other (See Comments)   unknown   Percocet [oxycodone-acetaminophen] Nausea And Vomiting   Penicillins Rash, Other (See Comments)   Has patient had a PCN reaction causing immediate rash, facial/tongue/throat swelling, SOB or lightheadedness with hypotension: No Has patient had a PCN reaction causing severe rash involving mucus membranes or skin necrosis: No Has patient had a PCN reaction that required hospitalization No Has patient had a PCN reaction occurring within the last 10 years: No If all of the above answers are "NO", then may proceed with Cephalosporin use.  Medication List    STOP taking these medications   traZODone 50 MG tablet Commonly known as:  DESYREL     TAKE these medications   aspirin EC 81 MG tablet Take 81 mg by mouth at bedtime.   dronabinol 2.5 MG capsule Commonly known as:  MARINOL Take 1 capsule (2.5 mg total) by mouth 2 (two) times daily before lunch and supper.   furosemide 20 MG tablet Commonly known as:  LASIX Take 1 tablet (20 mg total) daily by mouth.   ganciclovir 155 mg in sodium chloride 0.9 % 100 mL Inject 155 mg into the vein every 12 (twelve) hours for 7 days. Indication:  CMV colitis Last Day of Therapy:  09/01/17 Labs - Once weekly:  CBC/D and BMP, Labs - Every other week:  ESR and CRP   Gerhardt's butt cream Crea Apply 1  application topically 4 (four) times daily as needed for irritation.   pantoprazole 40 MG tablet Commonly known as:  PROTONIX Take 1 tablet (40 mg total) by mouth 2 (two) times daily.   potassium chloride SA 20 MEQ tablet Commonly known as:  K-DUR,KLOR-CON Take 1 tablet (20 mEq total) by mouth daily.   PRESERVISION AREDS PO Take 1 tablet by mouth 2 (two) times daily.   saccharomyces boulardii 250 MG capsule Commonly known as:  FLORASTOR Take 1 capsule (250 mg total) by mouth 2 (two) times daily.   sucralfate 1 GM/10ML suspension Commonly known as:  CARAFATE Take 10 mLs (1 g total) by mouth 4 (four) times daily -  with meals and at bedtime.   Vitamin D3 2000 units Tabs Take 2,000 Units by mouth daily.            Home Infusion Instuctions  (From admission, onward)        Start     Ordered   08/25/17 0000  Home infusion instructions [facility] May follow [place] Dosing Protocol; May administer Cathflo as needed to maintain patency of vascular access device.; Flushing of vascular access device: per [facility] Protocol: 0.9% NaCl pre/post medica...    Question Answer Comment  Instructions May follow [place] Dosing Protocol   Instructions May administer Cathflo as needed to maintain patency of vascular access device.   Instructions Flushing of vascular access device: per [facility] Protocol: 0.9% NaCl pre/post medication administration and prn patency; Heparin 100 u/ml, 31m for implanted ports and Heparin 10u/ml, 584mfor all other central venous catheters.   Instructions May follow [facility] Anaphylaxis Protocol for First Dose Administration in the home: 0.9% NaCl at 25-50 ml/hr to maintain IV access for protocol meds. Epinephrine 0.3 ml IV/IM PRN and Benadryl 25-50 IV/IM PRN s/s of anaphylaxis.   Instructions [facility] Infusion Coordinator (RN) to assist per patient IV care needs in the home PRN.      08/25/17 1447     Allergies  Allergen Reactions  . Celebrex  [Celecoxib] Other (See Comments)    LEG ACHING   . Codeine Nausea And Vomiting  . Cymbalta [Duloxetine Hcl] Other (See Comments)    Sensitive per [facility]  . Doxycycline Swelling and Other (See Comments)    Facial swelling  . Meloxicam Other (See Comments)    TUMMY ACHE, INEFFECTIVE  . Nortriptyline Other (See Comments)    unknown  . Percocet [Oxycodone-Acetaminophen] Nausea And Vomiting  . Penicillins Rash and Other (See Comments)    Has patient had a PCN reaction causing immediate rash, facial/tongue/throat swelling, SOB or lightheadedness with hypotension:  No Has patient had a PCN reaction causing severe rash involving mucus membranes or skin necrosis: No Has patient had a PCN reaction that required hospitalization No Has patient had a PCN reaction occurring within the last 10 years: No If all of the above answers are "NO", then may proceed with Cephalosporin use.    Contact information for follow-up providers    Ronald Lobo, MD Follow up on 09/21/2017.   Specialty:  Gastroenterology Why:  at 11:30 am for colitis Contact information: [address] [place] [address] [phone]            Contact information for after-discharge care    Destination    [facility] SNF/ALF Follow up.   Service:  Skilled Nursing Contact information: [address] [place] [phone]                   The results of significant diagnostics from this hospitalization (including imaging, microbiology, ancillary and laboratory) are listed below for reference.    Significant Diagnostic Studies: Ct Abdomen Pelvis W Contrast  Result Date: 08/18/2017 CLINICAL DATA:  Colonoscopy earlier today with stricture 15 cm from the anus. Recent diagnosis and treatment for CMV colitis. EXAM: CT ABDOMEN AND PELVIS WITH CONTRAST TECHNIQUE: Multidetector CT imaging of the abdomen and pelvis was performed using the standard protocol  following bolus administration of intravenous contrast. CONTRAST:  100 cc Isovue-300 IV COMPARISON:  Most recent CT 05/16/2017, additional priors FINDINGS: Lower chest: Bibasilar atelectasis, right greater than left. Resolved pleural effusions. Pacemaker is partially included. Hepatobiliary: No focal hepatic lesion. Postcholecystectomy. Prominent common bile duct measuring 12 mm, with mild central intrahepatic biliary ductal dilatation. No visualized choledocholithiasis. Pancreas: Parenchymal atrophy. No ductal dilatation or inflammation. Spleen: Normal in size without focal abnormality. Splenule at the hilum. Adrenals/Urinary Tract: No adrenal nodule. No hydronephrosis or perinephric edema. Homogeneous renal enhancement with symmetric excretion on delayed phase imaging. Urinary bladder is physiologically distended without wall thickening. Stomach/Bowel: Moderate length segment of colonic wall thickening in the mid sigmoid with pericolonic edema. Mild rectal wall thickening. Presacral edema appears similar to prior. Colonic wall thickening of the proximal descending colon with persistent but improved pericolonic inflammatory change. Moderate to large stool burden in the more proximal colon. Small volume of formed stool in the distal descending and proximal sigmoid colon with minimal diverticulosis. No diverticulitis, pneumatosis or perforation. Administered enteric contrast reaches the hepatic flexure. No small bowel inflammation or obstruction. Normal appendix. Stomach is nondistended. Vascular/Lymphatic: Aortic and branch atherosclerosis. Circumaortic left renal vein. Small retroperitoneal nodes. No definite pericolonic or pelvic adenopathy. Reproductive: Atrophic uterus. Low-density structure in the right adnexa measuring 2.4 cm, higher than simple fluid density, similar in size compared to prior exams. No air in the endometrial cavity, minimal air in the vagina. Other: No free air or ascites. Presacral edema  appear similar to prior exam. Musculoskeletal: Posterior L2 through L5 fusion with posterior rods and intrapedicular screws. The right L2 pedicle screw extends lateral to the vertebral body, unchanged. No acute osseous abnormality. IMPRESSION: 1. Moderate length sigmoid colonic wall thickening and pericolonic edema, similar in distribution to prior exam, likely corresponding to reported stricture on colonoscopy. Additional rectal and proximal descending colonic wall thickening are also similar in distribution. CT findings suggest active colitis. 2. Moderate proximal stool burden. 3. Postcholecystectomy with mild biliary dilatation. Recommend correlation with LFTs. If normal LFTs, findings likely secondary to prior cholecystectomy and no further evaluation is needed. If  elevated, consider further evaluation with ERCP or MRCP. 4.  Aortic Atherosclerosis (ICD10-I70.0). Electronically Signed   By: Jeb Levering M.D.   On: 08/18/2017 22:35    Microbiology: Recent Results (from the past 240 hour(s))  Cytomegalovirus (CMV) Culture     Status: None   Collection Time: 08/20/17  2:33 PM  Result Value Ref Range Status   Cytomegalovirus (CMV) Culture Comment  Final    Comment: (NOTE) No Cytomegalovirus detected in the shell vial culture. Conventional tissue culture results to follow. Performed At: [facility] [place], [place] [id number] Rush Farmer MD AS:[id number]    Source of Sample RECTUM  Final     Labs: Basic Metabolic Panel: Recent Labs  Lab 08/21/17 0528 08/22/17 0421 08/23/17 0502 08/24/17 0436 08/25/17 0322  NA 137 137 137 134* 136  K 3.4* 3.6 3.2* 4.1 3.5  CL 109 108 105 105 104  CO2 24 23 25 22 26   GLUCOSE 111* 109* 114* 107* 120*  BUN <5* <5* <5* 7 7  CREATININE 0.50 0.46 0.56 0.58 0.52  CALCIUM 8.2* 8.3* 8.5* 8.6* 8.3*  MG 2.0 1.9 1.9 1.9 1.8   Liver Function Tests: Recent Labs  Lab 08/19/17 0421  AST 14*  ALT 12*  ALKPHOS 85  BILITOT 0.3    PROT 5.7*  ALBUMIN 2.5*   No results for input(s): LIPASE, AMYLASE in the last 168 hours. No results for input(s): AMMONIA in the last 168 hours. CBC: Recent Labs  Lab 08/21/17 0528 08/22/17 0421 08/23/17 0502 08/24/17 0436 08/25/17 0322  WBC 4.7 5.6 4.6 6.5 7.8  NEUTROABS 1.9 2.3 1.8 2.8 4.5  HGB 9.2* 9.5* 10.1* 10.2* 10.1*  HCT 29.0* 29.7* 31.9* 32.1* 31.9*  MCV 87.3 87.6 86.9 86.8 86.7  PLT 315 369 408* 381 361   Cardiac Enzymes: No results for input(s): CKTOTAL, CKMB, CKMBINDEX, TROPONINI in the last 168 hours. BNP: BNP (last 3 results) Recent Labs    03/12/17 2331 05/16/17 0445  BNP 161.4* 185.2*    ProBNP (last 3 results) Recent Labs    05/24/17 1150  PROBNP 397    CBG: No results for input(s): GLUCAP in the last 168 hours.     Signed:  Florencia Reasons MD, PhD  [facility] 08/25/2017, 2:48 PM

## 2017-08-25 NOTE — NC FL2 (Signed)
[place] MEDICAID FL2 LEVEL OF CARE SCREENING TOOL     IDENTIFICATION  Patient Name: Sharon Gilmore Birthdate: 03-17-1934 Sex: female Admission Date (Current Location): 08/18/2017  [facility] and [place] Number:  Producer, television/film/video and Address:  [facility],  [address] [place] [address]      Provider Number: [id number]  Attending Physician Name and Address:  Albertine Grates, MD  Relative Name and Phone Number:       Current Level of Care: Hospital Recommended Level of Care: Skilled Nursing Facility Prior Approval Number:    Date Approved/Denied:   PASRR Number: [id number]  Discharge Plan: SNF    Current Diagnoses: Patient Active Problem List   Diagnosis Date Noted  . Colonic stricture (HCC) 08/18/2017  . Weight loss, non-intentional 07/05/2017  . URI (upper respiratory infection) 06/15/2017  . Medication monitoring encounter 06/11/2017  . Cardiac pacemaker in situ 05/24/2017  . Rectal abnormality 05/16/2017  . SBO (small bowel obstruction) (HCC)   . PAF (paroxysmal atrial fibrillation) (HCC)   . Second degree AV block, Mobitz type II   . History of CMV colitis (HCC)   . AKI (acute kidney injury) (HCC)   . Urinary tract infection without hematuria   . Lumbar spinal stenosis 04/23/2017  . DOE (dyspnea on exertion) 03/08/2017  . AV block, 2nd degree 03/08/2017  . Essential hypertension 03/08/2017  . Family history of coronary artery disease in brother 03/08/2017  . History of asthma 03/08/2017  . AVB (atrioventricular block) 03/08/2017  . Abdominal pain 01/04/2017  . Spinal stenosis 01/04/2017    Orientation RESPIRATION BLADDER Height & Weight     Self, Time, Situation, Place  Normal Continent Weight: 165 lb 5.5 oz (75 kg) Height:  5\' 4"  (162.6 cm)  BEHAVIORAL SYMPTOMS/MOOD NEUROLOGICAL BOWEL NUTRITION STATUS      Continent Diet(See DC Summary)  AMBULATORY STATUS COMMUNICATION OF NEEDS Skin   Independent Verbally Normal                    Personal Care Assistance Level of Assistance  Bathing, Feeding, Dressing Bathing Assistance: Limited assistance Feeding assistance: Independent Dressing Assistance: Limited assistance     Functional Limitations Info  Sight, Hearing, Speech Sight Info: Adequate Hearing Info: Adequate Speech Info: Adequate    SPECIAL CARE FACTORS FREQUENCY   PT licensed therapist     5x/week                Contractures Contractures Info: Not present    Additional Factors Info  Allergies, Code Status Code Status Info: Full Allergies Info: CELEBREX CELECOXIB, CODEINE, CYMBALTA DULOXETINE HCL, DOXYCYCLINE, MELOXICAM, NORTRIPTYLINE, PERCOCET OXYCODONE-ACETAMINOPHEN, PENICILLINS            Current Medications (08/25/2017):  This is the current hospital active medication list Current Facility-Administered Medications  Medication Dose Route Frequency Provider Last Rate Last Dose  . dextrose 5 % and 0.45 % NaCl with KCl 10 mEq/L infusion   Intravenous Continuous Glade Lloyd, MD 50 mL/hr at 08/25/17 0607    . dronabinol (MARINOL) capsule 2.5 mg  2.5 mg Oral BID AC Marinda Elk, MD   2.5 mg at 08/24/17 1818  . furosemide (LASIX) tablet 40 mg  40 mg Oral Daily Glade Lloyd, MD   40 mg at 08/25/17 1026  . ganciclovir (CYTOVENE) 155 mg in sodium chloride 0.9 % 100 mL IVPB  2.5 mg/kg (Adjusted) Intravenous Q12H Judyann Munson, MD 100 mL/hr at 08/25/17 0958 155 mg at 08/25/17 0958  .  Gerhardt's butt cream 1 application  1 application Topical QID PRN Marinda ElkFeliz Ortiz, Abraham, MD      . heparin injection 5,000 Units  5,000 Units Subcutaneous Q8H Marinda ElkFeliz Ortiz, Abraham, MD   5,000 Units at 08/25/17 0604  . lip balm (CARMEX) ointment   Topical PRN Marinda ElkFeliz Ortiz, Abraham, MD      . morphine 2 MG/ML injection 1 mg  1 mg Intravenous Q3H PRN Kirby-Graham, Beather ArbourKaren J, NP      . pantoprazole (PROTONIX) EC tablet 40 mg  40 mg Oral BID Hessie KnowsLegge, Justin M, RPH   40 mg at 08/25/17 1026  . prochlorperazine  (COMPAZINE) injection 10 mg  10 mg Intravenous Q4H PRN Marinda ElkFeliz Ortiz, Abraham, MD   10 mg at 08/22/17 0810  . sodium chloride flush (NS) 0.9 % injection 10-40 mL  10-40 mL Intracatheter PRN Alekh, Kshitiz, MD      . sucralfate (CARAFATE) 1 GM/10ML suspension 1 g  1 g Oral TID WC & HS Marinda ElkFeliz Ortiz, Abraham, MD   1 g at 08/25/17 [id number]     Discharge Medications: Please see discharge summary for a list of discharge medications.  Relevant Imaging Results:  Relevant Lab Results:   Additional Information SS#: [id number]   IV ganciclovir treatment for CMV colitis. Plan to do 14 days. Appears to tolerate current dose.   Clearance CootsNicole A Jina Olenick, LCSW

## 2017-08-25 NOTE — Progress Notes (Signed)
Plans for discharge noted.  Patient's nausea has resolved.  She is eating well.  She has no abdominal pain.  Her main clinical problem at this time is frequent stools, which are a mixture of liquid and small "pebbles."  She is having a bowel movement roughly every 1-2 hours while awake, and several times during the night.  Nonetheless, she feels ready to go to the rehab facility [facility].  Her biopsies did not show CMV on immunohistochemical staining.  I discussed this finding with the patient and with the infectious disease specialist.  Considering that her sigmoidoscopy was a limited exam done with a pediatric scope and pediatric biopsy forceps, the tissue obtained, although adequate, but was probably not optimal so there could be a sampling error which failed to detect CMV; I think it is possible that she still has an active infection, and this would be consistent with the fact that she has shown considerable improvement on intravenous ganciclovir.  We agree that continued therapy with ganciclovir for a total 14-day course, as planned, is appropriate.  The patient has a follow-up visit scheduled with me for February 5, but she knows to contact me sooner if her symptoms worsen.  Florencia Reasonsobert V. Georg Ang, M.D. Pager [phone] If no answer or after 5 PM call [phone]

## 2017-08-25 NOTE — Clinical Social Work Placement (Addendum)
D/C Summary sent Patient son informed/ he will transport.  Patient will go to room 7002  CLINICAL SOCIAL WORK PLACEMENT  NOTE  Date:  08/25/2017  Patient Details  Name: Sharon Gilmore MRN: [id number] Date of Birth: 1933-12-18  Clinical Social Work is seeking post-discharge placement for this patient at the Skilled  Nursing Facility level of care (*CSW will initial, date and re-position this form in  chart as items are completed):  No   Patient/family provided with [facility] Clinical Social Work Department's list of facilities offering this level of care within the geographic area requested by the patient (or if unable, by the patient's family).  No   Patient/family informed of their freedom to choose among providers that offer the needed level of care, that participate in Medicare, Medicaid or managed care program needed by the patient, have an available bed and are willing to accept the patient.  No   Patient/family informed of [facility]'s ownership interest in [facility] and [facility], as well as of the fact that they are under no obligation to receive care at these facilities.  PASRR submitted to EDS on       PASRR number received on       Existing PASRR number confirmed on 08/25/17     FL2 transmitted to all facilities in geographic area requested by pt/family on       FL2 transmitted to all facilities within larger geographic area on       Patient informed that his/her managed care company has contracts with or will negotiate with certain facilities, including the following:  [facility]         Patient/family informed of bed offers received.  [facility]     Physician recommends and patient chooses bed at      [facility]  .  Patient to be transferred to facility by     Family car  Patient family notified on   of transfer. 08/25/2017  Name of family member  notified:      Sharon Gilmore  PHYSICIAN Please prepare priority discharge summary, including medications, Please sign FL2     Additional Comment:    _______________________________________________ Clearance CootsNicole A Curly Mackowski, LCSW 08/25/2017, 10:44 AM

## 2017-08-25 NOTE — Clinical Social Work Note (Signed)
Clinical Social Work Assessment  Patient Details  Name: Sharon Gilmore MRN: [id number] Date of Birth: 12-Apr-1934  Date of referral:  08/25/17               Reason for consult:  Facility Placement                Permission sought to share information with:  Family Supports Permission granted to share information::  Yes, Verbal Permission Granted  Name::        Agency::   [facility]   Relationship::  Sons   Contact Information:     Housing/Transportation Living arrangements for the past 2 months:  Charity fundraiser of Information:  Patient Patient Interpreter Needed:  None Criminal Activity/Legal Involvement Pertinent to Current Situation/Hospitalization:  No - Comment as needed Significant Relationships:  Adult Children Lives with:  Facility Resident, Self Do you feel safe going back to the place where you live?  Yes Need for family participation in patient care:  Yes (Comment)  Care giving concerns:  (Weight Loss) Patient presented for surgery, with diagnosis of sigmoid stricture, colitis.   Social Worker assessment / plan:  CSW met with patient at bedside, explain role and reason for visit to assist with discharge to [facility] SNF for continued IV antibiotics. The patient is a resident at the independent living facility.  Patient uses a walker at baseline and is able to complete her own ADL's.  CSW confirmed SNF bed w/ social worker [place].   Plan: Assist with discharge to SNF.   Employment status:  Retired Forensic scientist:  Medicare PT Recommendations:  Not assessed at this time [place] / Referral to community resources:  [place]  Patient/Family's Response to care:  Agreeable and Responding well to care.   Patient/Family's Understanding of and Emotional Response to Diagnosis, Current Treatment, and Prognosis: Patient is very knowledgeable of care and will continue care at [facility].   Emotional Assessment Appearance:   Appears stated age Attitude/Demeanor/Rapport:    Affect (typically observed):  Accepting, Pleasant Orientation:  Oriented to Self, Oriented to Place, Oriented to  Time, Oriented to Situation Alcohol / Substance use:  Not Applicable Psych involvement (Current and /or in the community):  No (Comment)  Discharge Needs  Concerns to be addressed:  Discharge Planning Concerns Readmission within the last 30 days:  No Current discharge risk:  None Barriers to Discharge:  No Barriers Identified   Lia Hopping, LCSW 08/25/2017, 11:10 AM

## 2017-08-26 LAB — CMV ANTIBODY, IGG (EIA): CMV Ab - IgG: >10 U/mL — ABNORMAL HIGH (ref 0.00–0.59)

## 2017-08-28 LAB — CYTOMEGALOVIRUS (CMV) CULTURE - CMVCUL

## 2017-09-02 ENCOUNTER — Encounter: Payer: Self-pay | Admitting: Internal Medicine

## 2017-09-02 ENCOUNTER — Ambulatory Visit (INDEPENDENT_AMBULATORY_CARE_PROVIDER_SITE_OTHER): Payer: Medicare Other | Admitting: Internal Medicine

## 2017-09-02 VITALS — BP 165/91 | HR 90 | Temp 98.1°F | Wt 153.0 lb

## 2017-09-02 DIAGNOSIS — A0839 Other viral enteritis: Secondary | ICD-10-CM | POA: Diagnosis not present

## 2017-09-02 DIAGNOSIS — Z452 Encounter for adjustment and management of vascular access device: Secondary | ICD-10-CM | POA: Diagnosis present

## 2017-09-02 DIAGNOSIS — B259 Cytomegaloviral disease, unspecified: Secondary | ICD-10-CM

## 2017-09-02 DIAGNOSIS — Z5181 Encounter for therapeutic drug level monitoring: Secondary | ICD-10-CM | POA: Diagnosis not present

## 2017-09-02 DIAGNOSIS — R197 Diarrhea, unspecified: Secondary | ICD-10-CM | POA: Diagnosis not present

## 2017-09-02 MED ORDER — PREDNISONE 20 MG PO TABS: 40.0000 mg | ORAL_TABLET | Freq: Every day | ORAL | 0 refills | Status: DC

## 2017-09-02 NOTE — Progress Notes (Signed)
Rfv: colitis  Patient ID: Sharon Gilmore, female   DOB: [birth year], 84 y.o.   MRN: [id number]  HPI Sharon Gilmore is a 84yo F with no prior history of being immunocompromised started to have evidence of bloody, severe diarrhea in the Fall. 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 She was given 2 wk course of oral valganciclovir with mild improvement. She was readmitted in the early January for worsening abdominal cramping and diarrhea. Her CT showed persistent involvement and on colonoscopy had some evidence of anal/distal rectal stricture. She underwent flex sig that did not show further signs of CMV on path but also had significant stricture that may need surgical intervention. we committed her to 2 weeks of IV ganciclovir to see if this was still part of her clinical presentation causing the colitis. She still has finished her 14d trial as of yesterday, and feels that she still has abdominal cramping and majority is bloating and frequent small caliber soft stools  Wbc of 7.8 hgb 9.2 and plt 313 on 1/15 cr 0.51  Outpatient Encounter Medications as of 09/02/2017  Medication Sig  . aspirin EC 81 MG tablet Take 81 mg by mouth at bedtime.   . Cholecalciferol (VITAMIN D3) 2000 units TABS Take 2,000 Units by mouth daily.   Marland Kitchen. dronabinol (MARINOL) 2.5 MG capsule Take 1 capsule (2.5 mg total) by mouth 2 (two) times daily before lunch and supper.  . furosemide (LASIX) 20 MG tablet Take 1 tablet (20 mg total) daily by mouth.  . Hydrocortisone (GERHARDT'S BUTT CREAM) CREA Apply 1 application topically 4 (four) times daily as needed for irritation.  . Multiple Vitamins-Minerals (PRESERVISION AREDS PO) Take 1 tablet by mouth 2 (two) times daily.  . pantoprazole (PROTONIX) 40 MG tablet Take 1 tablet (40 mg total) by mouth 2 (two) times daily.  . potassium chloride SA (K-DUR,KLOR-CON) 20 MEQ tablet Take 1 tablet (20 mEq total) by mouth daily. (Patient not  taking: Reported on 06/22/2017)  . saccharomyces boulardii (FLORASTOR) 250 MG capsule Take 1 capsule (250 mg total) by mouth 2 (two) times daily.  . sucralfate (CARAFATE) 1 GM/10ML suspension Take 10 mLs (1 g total) by mouth 4 (four) times daily -  with meals and at bedtime.   No facility-administered encounter medications on file as of 09/02/2017.      Patient Active Problem List   Diagnosis Date Noted  . Leukopenia   . Hypokalemia   . Colonic stricture (HCC) 08/18/2017  . Weight loss, non-intentional 07/05/2017  . URI (upper respiratory infection) 06/15/2017  . Medication monitoring encounter 06/11/2017  . Cardiac pacemaker in situ 05/24/2017  . Rectal abnormality 05/16/2017  . SBO (small bowel obstruction) (HCC)   . PAF (paroxysmal atrial fibrillation) (HCC)   . Second degree AV block, Mobitz type II   . History of CMV colitis (HCC)   . AKI (acute kidney injury) (HCC)   . Urinary tract infection without hematuria   . Lumbar spinal stenosis 04/23/2017  . DOE (dyspnea on exertion) 03/08/2017  . AV block, 2nd degree 03/08/2017  . Essential hypertension 03/08/2017  . Family history of coronary artery disease in brother 03/08/2017  . History of asthma 03/08/2017  . AVB (atrioventricular block) 03/08/2017  . Abdominal pain 01/04/2017  . Spinal stenosis 01/04/2017     Health Maintenance Due  Topic Date Due  . TETANUS/TDAP  07/17/1955  . DEXA SCAN  07/16/2001  . PNA vac Low Risk  Adult (1 of 2 - PCV13) 07/16/2001     Review of Systems Per hpi, otherwise 12 point ros is negative Physical Exam   BP (!) 165/91   Pulse 90   Temp 98.1 F (36.7 C) (Oral)   Wt 153 lb (69.4 kg)   BMI 26.26 kg/m  Gen= a x o by 4, in NAD, appears fatigued Cors = nl s1,s2, no g/m/r abd = slow BS, distended+, no guarding CBC Lab Results  Component Value Date   WBC 7.8 08/25/2017   RBC 3.68 (L) 08/25/2017   HGB 10.1 (L) 08/25/2017   HCT 31.9 (L) 08/25/2017   PLT 361 08/25/2017   MCV 86.7  08/25/2017   MCH 27.4 08/25/2017   MCHC 31.7 08/25/2017   RDW 16.6 (H) 08/25/2017   LYMPHSABS 2.2 08/25/2017   MONOABS 0.6 08/25/2017   EOSABS 0.5 08/25/2017    BMET Lab Results  Component Value Date   NA 136 08/25/2017   K 3.5 08/25/2017   CL 104 08/25/2017   CO2 26 08/25/2017   GLUCOSE 120 (H) 08/25/2017   BUN 7 08/25/2017   CREATININE 0.52 08/25/2017   CALCIUM 8.3 (L) 08/25/2017   GFRNONAA >60 08/25/2017   GFRAA >60 08/25/2017      Assessment and Plan  colitis, of unclear reasons = she initially had signs of CMV colitis though unsure of the IF stain was diffusely positive vs. Some scattered stain showing less burden of disease. She has now been treated with an induction course of ganciclovir without much improvement. I suspect that this was not the complete cause of her presentation and would like to try doing high dose steroids to see if that is better for her. We have pulled picc line, discussed with dr buccini to do steroid trial he sees her back in 2 weeks  Return to clinic if needed

## 2017-09-02 NOTE — Progress Notes (Signed)
RN received verbal order from Dr. Drue SecondSnider to discontinue the patient's PICC line.  Patient identified with name and date of birth. PICC dressing removed, site unremarkable.  PICC line removed using sterile technique @ 1200. PICC length equal to that noted in patient's hospital chart of 37 cm. Sterile petroleum gauze + sterile 4X4 applied to PICC site, pressure applied for 10 minutes and covered with Medipore tape as a pressure dressing. Patient tolerated procedure without complaints.  Patient instructed to limit use of arm for 1 hour. Patient instructed that the pressure dressing should remain in place for 24 hours. Patient verbalized understanding of these instructions.

## 2017-09-13 ENCOUNTER — Encounter: Payer: Medicare Other | Admitting: Physician Assistant

## 2017-09-13 ENCOUNTER — Ambulatory Visit (INDEPENDENT_AMBULATORY_CARE_PROVIDER_SITE_OTHER): Payer: Medicare Other | Admitting: *Deleted

## 2017-09-13 ENCOUNTER — Telehealth: Payer: Self-pay | Admitting: Cardiology

## 2017-09-13 DIAGNOSIS — I441 Atrioventricular block, second degree: Secondary | ICD-10-CM

## 2017-09-13 NOTE — Progress Notes (Signed)
Cardiology Office Note Date:  09/17/2017  Patient ID:  Sharon Gilmore 1933-12-09, MRN [id number] PCP:  Pearson Forster, MD  Cardiologist:  Dr. Delton See Electrophysiologist: Dr. Johney Frame   Chief Complaint:  planned f/u  History of Present Illness: Sharon Gilmore is a 84 y.o. 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 Johney Frame.  She had a hospital stay Aug 2018 c/o ongoing DOE/SOB (uncheaged from prior to her PPM) and HTN, CXR post pacer was ne for ptx, CT r/o PE, PPM interrogation was reported in notes to show normal function, and given elevated Trop underwent LHC noting 60% LAD to be managed medically.  Troponin elevation suspect 2/2 demand ischemia with HTN urgency.   She was discharged 03/17/17  She is a resident at [facility] independent living.  She was seen by myself in conjunction with Dr. Johney Frame, early August post hospital stay she felt like she was doing pretty well, had days she feels like her energy may not be as good as other days.  Her PT seems to really be 2/2 to her back/LE neuropathy issues.   She denied any CP or palpitations, no SOB, symptoms of PND or orthopnea, no dizziness, near syncope or syncope.  Her cath site had no complications.  She is a retired Charity fundraiser, mentioned taking the lopressor QID is quite inconvenient and asks if we could dose this differently.  She reported BP 109/57-143/68 lowest and highest since discharge, her :Lopressor was changed to 50mg  BID, with plans to delay back surgery until early September and see her back in a couple weeks.  If stable, and doing well, would plan to pursue her back surgery.  I saw her again late Aug 2018, her BP well controlled, and she was tolerating the statin without side effects/myalgias.  She mentioned she concerns she may be depressed.  she denied any kind of CP, palpitations or SOB, no DOE, she was ambulating, able  to do her PT, walks to dinner, bingo, and denies any physical symptoms outside of her leg/back.  She denied any dizziness, near syncope or syncope.  Her back surgery was planned for early Sept.  She had her back surgery, though unfortunately had another hospital stay Sept 2018 with urosepsis/ileus, developed AFib felt 2/2 infection/sepsis, not planned for a/c to follow via device if further then to consider a/c.  Her BC were negative for 5 days (x2).   She saw Dr. Johney Frame Oct 2018, no recurrent AFib noted, noted to have some fluid OL (2/2 DD, hypoalbuminemia, inactivity) and given short course of diuretic.  Yet another hsospital stay discharged 08/25/17 2/2 colonic stricture, partial obstruction, and CMV colitis, followed with ID discharged with picc and IV Abx/antifungal.  She saw Dr. Drue Second (ID) 09/02/16 despite treatment not much improvement in GI symptoms, and was unconvinced of infection as cause.  Picc was pulled and recommended course of steroids.  She is accompanied today by her son.  She is doing much better, in-fact states that this is the best she has felt really in the lasy year, making slow, but steady improvement in exertional tolerances, and overall general sense of well being.  She denies any CP, palpitations, no SOB, no dizziness, near syncope or syncope.   Device information: MDT dual chamber PPM implanted 03/09/17, Dr. Johney Frame, high degree AVBlock   Past Medical History:  Diagnosis Date  . Abnormal barium swallow  09/1999  . Arthritis   . Asthma   . Carpal tunnel syndrome   . CMV colitis (HCC)   . Depression   . Erythema nodosum   . H/O Doppler ultrasound    LEFT LEG 11/1997  . Hypertension   . Psoriasis   . Rosacea   . Seizures (HCC) 1980  . Swallowing disorder     Past Surgical History:  Procedure Laterality Date  . BILATERAL CARPAL TUNNEL RELEASE    . CHOLECYSTECTOMY    . COLONOSCOPY  2004  . FLEXIBLE SIGMOIDOSCOPY N/A 05/17/2017   Procedure: FLEXIBLE SIGMOIDOSCOPY;   Surgeon: Carman ChingEdwards, James, MD;  [facility];  Service: Endoscopy;  Laterality: N/A;  . FLEXIBLE SIGMOIDOSCOPY N/A 08/20/2017   Procedure: FLEXIBLE SIGMOIDOSCOPY;  Surgeon: Carman ChingEdwards, James, MD;  Location: [facility] ENDOSCOPY;  Service: Endoscopy;  Laterality: N/A;  . INCISIONAL HERNIA REPAIR    . LEFT HEART CATH AND CORONARY ANGIOGRAPHY N/A 03/15/2017   Procedure: Left Heart Cath and Coronary Angiography;  Surgeon: Runell GessBerry, Jonathan J, MD;  [facility];  Service: Cardiovascular;  Laterality: N/A;  . PACEMAKER IMPLANT N/A 03/09/2017   Medtronic Azure XT MRI conditional dual-chamber pacemaker for symptomatic mobitz II second degree AV block by Dr Johney FrameAllred  . TONSILLECTOMY      Current Outpatient Medications  Medication Sig Dispense Refill  . aspirin EC 81 MG tablet Take 81 mg by mouth at bedtime.     . Cholecalciferol (VITAMIN D3) 2000 units TABS Take 2,000 Units by mouth daily.     Marland Kitchen. dronabinol (MARINOL) 2.5 MG capsule Take 1 capsule (2.5 mg total) by mouth 2 (two) times daily before lunch and supper. 60 capsule 0  . furosemide (LASIX) 20 MG tablet Take 1 tablet (20 mg total) daily by mouth. 90 tablet 3  . Hydrocortisone (GERHARDT'S BUTT CREAM) CREA Apply 1 application topically 4 (four) times daily as needed for irritation.    . Multiple Vitamins-Minerals (PRESERVISION AREDS PO) Take 1 tablet by mouth 2 (two) times daily.    . pantoprazole (PROTONIX) 40 MG tablet Take 1 tablet (40 mg total) by mouth 2 (two) times daily. 60 tablet 0  . potassium chloride SA (K-DUR,KLOR-CON) 20 MEQ tablet Take 1 tablet (20 mEq total) by mouth daily. 90 tablet 3  . predniSONE (DELTASONE) 20 MG tablet Take 2 tablets (40 mg total) by mouth daily with breakfast. X 5 days, then take 1.5 tabs for 5 days then 1 tab daily until complete 30 tablet 0  . saccharomyces boulardii (FLORASTOR) 250 MG capsule Take 1 capsule (250 mg total) by mouth 2 (two) times daily.     No current facility-administered medications  for this visit.     Allergies:   Celebrex [celecoxib]; Codeine; Cymbalta [duloxetine hcl]; Doxycycline; Meloxicam; Nortriptyline; Percocet [oxycodone-acetaminophen]; and Penicillins   Social History:  The patient  reports that she quit smoking about 37 years ago. she has never used smokeless tobacco. She reports that she drinks alcohol. She reports that she does not use drugs.   Family History:  The patient's family history includes Breast cancer in her paternal grandmother; CVA in her maternal grandmother, mother, and paternal grandfather; Coronary artery disease in her brother; Diabetes in her paternal grandmother; Leukemia in her brother; Lung cancer in her paternal uncle; Pancreatic cancer in her brother and paternal aunt; Parkinson's disease in her father; Peripheral vascular disease in her maternal grandfather; Stroke in her maternal grandmother, mother, paternal aunt, and paternal grandfather.  ROS:  Please see the  history of present illness.    All other systems are reviewed and otherwise negative.   PHYSICAL EXAM:  VS:  BP 133/81   Pulse 88   Ht 5\' 4"  (1.626 m)   Wt 146 lb (66.2 kg)   BMI 25.06 kg/m  BMI: Body mass index is 25.06 kg/m. Well nourished, well developed, in no acute distress  HEENT: normocephalic, atraumatic  Neck: no JVD, carotid bruits or masses Cardiac:  RRR; no significant murmurs, no rubs, or gallops Lungs:  CTA b/l, no wheezing, rhonchi or rales  Abd: soft, nontender MS: no deformity, age approprate atrophy Ext: no edema  Skin: warm and dry, no rash Neuro:  No gross deficits appreciated Psych: euthymic mood, full affect  PPM site is stable, no skin changes,no tethering or discomfort   EKG:  Not done today PPM interrogation done today and reviewed by myself: battery and lead measurements are good, VT, fast AV events are 1:1 AT, longest 6 seconds   Cardiac Catheterization 03/15/2017 IMPRESSION:Essentially normal coronary arteries with a 60% mid LAD  after a moderate size diagonal branch probably of no hemodynamic significance. The LVEDP was mildly elevated as was her blood pressure. I suspect her mildly elevated troponin was from demand ischemia and hypertension. Medical therapy will be recommended. The sheath was removed and a TR band was placed on the right wrist to achieve patent hemostasis. The patient left the lab in stable condition.   Transthoracic Echocardiogram 03/13/2017 Study Conclusions - Left ventricle: The cavity size was normal. Wall thickness was increased in a pattern of mild LVH.Systolic function was normal. The estimated ejection fraction was in the range of 55% to 60%. Doppler parameters are consistent with both elevated ventricular end-diastolic filling pressure and elevated left atrial filling pressure. - Aortic valve: Valve area (Vmax): 2.59 cm^2. - Mitral valve: Valve area by continuity equation (using LVOT flow): 2.07 cm^2. - Left atrium: The atrium was mildly dilated. - Atrial septum: No defect or patent foramen ovale was identified. - Pericardium, extracardiac: A trivial pericardial effusion was identified posterior to the heart.   06/09/16: stress myoview  Nuclear stress EF: 68%.  There was no ST segment deviation noted during stress.  The study is normal.  The left ventricular ejection fraction is hyperdynamic (>65%). Normal stress nuclear study with no ischemia or infarction; EF 68 with normal wall motion.   Recent Labs: 03/13/2017: TSH 2.010 05/16/2017: B Natriuretic Peptide 185.2 05/24/2017: NT-Pro BNP 397 08/19/2017: ALT 12 08/25/2017: BUN 7; Creatinine, Ser 0.52; Hemoglobin 10.1; Magnesium 1.8; Platelets 361; Potassium 3.5; Sodium 136  03/09/2017: Cholesterol 111; HDL 47; LDL Cholesterol 50; Total CHOL/HDL Ratio 2.4; Triglycerides 69; VLDL 14   CrCl cannot be calculated (Patient's most recent lab result is older than the maximum 21 days allowed.).   Wt Readings from Last 3  Encounters:  09/17/17 146 lb (66.2 kg)  09/02/17 153 lb (69.4 kg)  08/18/17 165 lb 5.5 oz (75 kg)     Other studies reviewed: Additional studies/records reviewed today include: summarized above  ASSESSMENT AND PLAN:  1. High degree heart block s/p PPM     Intact function  2. HTN, hx of hypertensive urgency     Looks good  3. Non-obstructive CAD by cath     She initially declined Lipitor stating her labs have never been elevated historically and has reservations about statins     We have previously discussed she does have (while not obstructive) CAD and the role statin plays not only in  LD lowering but plaque stabilization      Currently off her atorvastatin, she will f/u with her PMD, after everything is cleared up for certain from her colitis, and done with steroids  4. Sept 2018 had an isolated AFib event in the environment of sepis     NO AFib noted by her device since     No a/c   Disposition:  Q 3 month remote device chesk, in-clinic EP visit in 1 year, sooner if needed.  Current medicines are reviewed at length with the patient today.  The patient did not have any concerns regarding medicines.  Sharon Blonder, PA-C 09/17/2017 1:18 PM     [facility] [address] [place] [address] [phone] (office)  [phone] (fax)

## 2017-09-13 NOTE — Telephone Encounter (Signed)
LMOVM reminding pt to send remote transmission.   

## 2017-09-14 LAB — CUP PACEART REMOTE DEVICE CHECK
Battery Voltage: 3.14 V
Brady Statistic AP VP Percent: 0.39 %
Brady Statistic AS VS Percent: 99.5 %
Brady Statistic RV Percent Paced: 0.41 %
Implantable Lead Implant Date: 20180724
Implantable Lead Location: 753859
Implantable Pulse Generator Implant Date: 20180724
Lead Channel Impedance Value: 380 Ohm
Lead Channel Impedance Value: 380 Ohm
Lead Channel Impedance Value: 494 Ohm
Lead Channel Pacing Threshold Amplitude: 0.75 V
Lead Channel Pacing Threshold Amplitude: 1.125 V
Lead Channel Sensing Intrinsic Amplitude: 22.375 mV
Lead Channel Sensing Intrinsic Amplitude: 22.375 mV
Lead Channel Sensing Intrinsic Amplitude: 3 mV
Lead Channel Setting Pacing Amplitude: 2 V
Lead Channel Setting Pacing Amplitude: 2.75 V
Lead Channel Setting Pacing Pulse Width: 0.4 ms
Lead Channel Setting Sensing Sensitivity: 2 mV
MDC IDC LEAD IMPLANT DT: 20180724
MDC IDC LEAD LOCATION: 753860
MDC IDC MSMT BATTERY REMAINING LONGEVITY: 147 mo
MDC IDC MSMT LEADCHNL RA IMPEDANCE VALUE: 323 Ohm
MDC IDC MSMT LEADCHNL RA PACING THRESHOLD PULSEWIDTH: 0.4 ms
MDC IDC MSMT LEADCHNL RA SENSING INTR AMPL: 3 mV
MDC IDC MSMT LEADCHNL RV PACING THRESHOLD PULSEWIDTH: 0.4 ms
MDC IDC SESS DTM: 20190128210141
MDC IDC STAT BRADY AP VS PERCENT: 0.09 %
MDC IDC STAT BRADY AS VP PERCENT: 0.02 %
MDC IDC STAT BRADY RA PERCENT PACED: 0.49 %

## 2017-09-14 NOTE — Progress Notes (Signed)
Remote pacemaker transmission.   

## 2017-09-15 ENCOUNTER — Encounter: Payer: Self-pay | Admitting: Cardiology

## 2017-09-17 ENCOUNTER — Ambulatory Visit (INDEPENDENT_AMBULATORY_CARE_PROVIDER_SITE_OTHER): Payer: Medicare Other | Admitting: Physician Assistant

## 2017-09-17 VITALS — BP 133/81 | HR 88 | Ht 64.0 in | Wt 146.0 lb

## 2017-09-17 DIAGNOSIS — Z950 Presence of cardiac pacemaker: Secondary | ICD-10-CM | POA: Diagnosis not present

## 2017-09-17 DIAGNOSIS — I10 Essential (primary) hypertension: Secondary | ICD-10-CM | POA: Diagnosis not present

## 2017-09-17 DIAGNOSIS — I4430 Unspecified atrioventricular block: Secondary | ICD-10-CM

## 2017-09-17 NOTE — Patient Instructions (Addendum)
Medication Instructions:   Your physician recommends that you continue on your current medications as directed. Please refer to the Current Medication list given to you today.   If you need a refill on your cardiac medications before your next appointment, please call your pharmacy.  Labwork: NONE ORDERED  TODAY    Testing/Procedures: NONE ORDERED  TODAY    Follow-Up:  Your physician wants you to follow-up in: ONE YEAR WITH  ALLRED  You will receive a reminder letter in the mail two months in advance. If you don't receive a letter, please call our office to schedule the follow-up appointment.     Remote monitoring is used to monitor your Pacemaker of ICD from home. This monitoring reduces the number of office visits required to check your device to one time per year. It allows us to keep an eye on the functioning of your device to ensure it is working properly. You are scheduled for a device check from home on . 12-13-17  You may send your transmission at any time that day. If you have a wireless device, the transmission will be sent automatically. After your physician reviews your transmission, you will receive a postcard with your next transmission date.     Any Other Special Instructions Will Be Listed Below (If Applicable).

## 2017-09-22 ENCOUNTER — Other Ambulatory Visit: Payer: Self-pay | Admitting: Obstetrics and Gynecology

## 2017-09-22 DIAGNOSIS — Z1231 Encounter for screening mammogram for malignant neoplasm of breast: Secondary | ICD-10-CM

## 2017-10-12 ENCOUNTER — Ambulatory Visit
Admission: RE | Admit: 2017-10-12 | Discharge: 2017-10-12 | Disposition: A | Discharge status: Home / Self Care | Payer: Medicare Other | Source: Ambulatory Visit | Attending: Obstetrics and Gynecology | Admitting: Obstetrics and Gynecology

## 2017-10-12 DIAGNOSIS — Z1231 Encounter for screening mammogram for malignant neoplasm of breast: Secondary | ICD-10-CM

## 2017-11-19 ENCOUNTER — Inpatient Hospital Stay (HOSPITAL_COMMUNITY): Payer: Medicare Other | Admitting: Anesthesiology

## 2017-11-19 ENCOUNTER — Emergency Department (HOSPITAL_COMMUNITY): Payer: Medicare Other

## 2017-11-19 ENCOUNTER — Encounter (HOSPITAL_COMMUNITY): Admission: EM | Disposition: E | Payer: Self-pay | Source: Home / Self Care

## 2017-11-19 ENCOUNTER — Other Ambulatory Visit: Payer: Self-pay

## 2017-11-19 ENCOUNTER — Inpatient Hospital Stay (HOSPITAL_COMMUNITY)
Admission: EM | Admit: 2017-11-19 | Discharge: 2017-12-15 | DRG: 853 | Disposition: E | Payer: Medicare Other | Attending: Emergency Medicine | Admitting: Emergency Medicine

## 2017-11-19 ENCOUNTER — Inpatient Hospital Stay (HOSPITAL_COMMUNITY): Payer: Medicare Other

## 2017-11-19 DIAGNOSIS — Z800 Family history of malignant neoplasm of digestive organs: Secondary | ICD-10-CM

## 2017-11-19 DIAGNOSIS — Z9049 Acquired absence of other specified parts of digestive tract: Secondary | ICD-10-CM

## 2017-11-19 DIAGNOSIS — I480 Paroxysmal atrial fibrillation: Secondary | ICD-10-CM | POA: Diagnosis present

## 2017-11-19 DIAGNOSIS — K631 Perforation of intestine (nontraumatic): Secondary | ICD-10-CM | POA: Diagnosis present

## 2017-11-19 DIAGNOSIS — R6521 Severe sepsis with septic shock: Secondary | ICD-10-CM | POA: Diagnosis present

## 2017-11-19 DIAGNOSIS — K56699 Other intestinal obstruction unspecified as to partial versus complete obstruction: Secondary | ICD-10-CM | POA: Diagnosis present

## 2017-11-19 DIAGNOSIS — E872 Acidosis: Secondary | ICD-10-CM | POA: Diagnosis present

## 2017-11-19 DIAGNOSIS — A419 Sepsis, unspecified organism: Secondary | ICD-10-CM | POA: Diagnosis present

## 2017-11-19 DIAGNOSIS — R109 Unspecified abdominal pain: Secondary | ICD-10-CM | POA: Diagnosis present

## 2017-11-19 DIAGNOSIS — R0902 Hypoxemia: Secondary | ICD-10-CM | POA: Diagnosis present

## 2017-11-19 DIAGNOSIS — K6389 Other specified diseases of intestine: Secondary | ICD-10-CM

## 2017-11-19 DIAGNOSIS — Z950 Presence of cardiac pacemaker: Secondary | ICD-10-CM

## 2017-11-19 DIAGNOSIS — K659 Peritonitis, unspecified: Secondary | ICD-10-CM | POA: Diagnosis present

## 2017-11-19 DIAGNOSIS — Z823 Family history of stroke: Secondary | ICD-10-CM | POA: Diagnosis not present

## 2017-11-19 DIAGNOSIS — Z66 Do not resuscitate: Secondary | ICD-10-CM | POA: Diagnosis present

## 2017-11-19 DIAGNOSIS — Z515 Encounter for palliative care: Secondary | ICD-10-CM | POA: Diagnosis present

## 2017-11-19 DIAGNOSIS — E876 Hypokalemia: Secondary | ICD-10-CM | POA: Diagnosis present

## 2017-11-19 DIAGNOSIS — G9341 Metabolic encephalopathy: Secondary | ICD-10-CM | POA: Diagnosis present

## 2017-11-19 DIAGNOSIS — Z7952 Long term (current) use of systemic steroids: Secondary | ICD-10-CM | POA: Diagnosis not present

## 2017-11-19 DIAGNOSIS — E861 Hypovolemia: Secondary | ICD-10-CM | POA: Diagnosis present

## 2017-11-19 DIAGNOSIS — Z01818 Encounter for other preprocedural examination: Secondary | ICD-10-CM

## 2017-11-19 DIAGNOSIS — Z7982 Long term (current) use of aspirin: Secondary | ICD-10-CM | POA: Diagnosis not present

## 2017-11-19 DIAGNOSIS — N179 Acute kidney failure, unspecified: Secondary | ICD-10-CM | POA: Diagnosis present

## 2017-11-19 DIAGNOSIS — I10 Essential (primary) hypertension: Secondary | ICD-10-CM | POA: Diagnosis present

## 2017-11-19 DIAGNOSIS — Z87891 Personal history of nicotine dependence: Secondary | ICD-10-CM

## 2017-11-19 DIAGNOSIS — Z8249 Family history of ischemic heart disease and other diseases of the circulatory system: Secondary | ICD-10-CM

## 2017-11-19 DIAGNOSIS — J45909 Unspecified asthma, uncomplicated: Secondary | ICD-10-CM | POA: Diagnosis present

## 2017-11-19 HISTORY — PX: COLOSTOMY: SHX63

## 2017-11-19 HISTORY — PX: COLON RESECTION SIGMOID: SHX6737

## 2017-11-19 HISTORY — PX: LAPAROTOMY: SHX154

## 2017-11-19 LAB — I-STAT ARTERIAL BLOOD GAS, ED
Acid-base deficit: 11 mmol/L — ABNORMAL HIGH (ref 0.0–2.0)
BICARBONATE: 15.2 mmol/L — AB (ref 20.0–28.0)
O2 SAT: 99 %
PCO2 ART: 33.2 mmHg (ref 32.0–48.0)
Patient temperature: 98
TCO2: 16 mmol/L — AB (ref 22–32)
pH, Arterial: 7.268 — ABNORMAL LOW (ref 7.350–7.450)
pO2, Arterial: 147 mmHg — ABNORMAL HIGH (ref 83.0–108.0)

## 2017-11-19 LAB — RENAL FUNCTION PANEL
Albumin: 2 g/dL — ABNORMAL LOW (ref 3.5–5.0)
Anion gap: 19 — ABNORMAL HIGH (ref 5–15)
BUN: 22 mg/dL — ABNORMAL HIGH (ref 6–20)
CHLORIDE: 106 mmol/L (ref 101–111)
CO2: 16 mmol/L — AB (ref 22–32)
Calcium: 6.8 mg/dL — ABNORMAL LOW (ref 8.9–10.3)
Creatinine, Ser: 1.49 mg/dL — ABNORMAL HIGH (ref 0.44–1.00)
GFR calc Af Amer: 37 mL/min — ABNORMAL LOW (ref >60)
GFR calc non Af Amer: 32 mL/min — ABNORMAL LOW (ref >60)
GLUCOSE: 148 mg/dL — AB (ref 65–99)
POTASSIUM: 6.6 mmol/L — AB (ref 3.5–5.1)
Phosphorus: 7.9 mg/dL — ABNORMAL HIGH (ref 2.5–4.6)
Sodium: 141 mmol/L (ref 135–145)

## 2017-11-19 LAB — COMPREHENSIVE METABOLIC PANEL
ALBUMIN: 1.7 g/dL — AB (ref 3.5–5.0)
ALT: 19 U/L (ref 14–54)
AST: 35 U/L (ref 15–41)
Alkaline Phosphatase: 64 U/L (ref 38–126)
Anion gap: 13 (ref 5–15)
BILIRUBIN TOTAL: 0.9 mg/dL (ref 0.3–1.2)
BUN: 20 mg/dL (ref 6–20)
CO2: 16 mmol/L — AB (ref 22–32)
CREATININE: 1.23 mg/dL — AB (ref 0.44–1.00)
Calcium: 6.8 mg/dL — ABNORMAL LOW (ref 8.9–10.3)
Chloride: 110 mmol/L (ref 101–111)
GFR calc Af Amer: 46 mL/min — ABNORMAL LOW (ref >60)
GFR calc non Af Amer: 40 mL/min — ABNORMAL LOW (ref >60)
GLUCOSE: 123 mg/dL — AB (ref 65–99)
Potassium: 3.3 mmol/L — ABNORMAL LOW (ref 3.5–5.1)
SODIUM: 139 mmol/L (ref 135–145)
Total Protein: 3.8 g/dL — ABNORMAL LOW (ref 6.5–8.1)

## 2017-11-19 LAB — CBC WITH DIFFERENTIAL/PLATELET
BAND NEUTROPHILS: 55 %
BASOS ABS: 0 10*3/uL (ref 0.0–0.1)
BASOS PCT: 0 %
Blasts: 0 %
EOS ABS: 0 10*3/uL (ref 0.0–0.7)
Eosinophils Relative: 0 %
HCT: 32.3 % — ABNORMAL LOW (ref 36.0–46.0)
Hemoglobin: 9.6 g/dL — ABNORMAL LOW (ref 12.0–15.0)
Lymphocytes Relative: 12 %
Lymphs Abs: 0.9 10*3/uL (ref 0.7–4.0)
MCH: 25 pg — ABNORMAL LOW (ref 26.0–34.0)
MCHC: 29.7 g/dL — ABNORMAL LOW (ref 30.0–36.0)
MCV: 84.1 fL (ref 78.0–100.0)
METAMYELOCYTES PCT: 17 %
MONO ABS: 0 10*3/uL — AB (ref 0.1–1.0)
MYELOCYTES: 3 %
Monocytes Relative: 0 %
Neutro Abs: 6.4 10*3/uL (ref 1.7–7.7)
Neutrophils Relative %: 13 %
OTHER: 0 %
PLATELETS: 210 10*3/uL (ref 150–400)
PROMYELOCYTES ABS: 0 %
RBC: 3.84 MIL/uL — ABNORMAL LOW (ref 3.87–5.11)
RDW: 18.7 % — ABNORMAL HIGH (ref 11.5–15.5)
WBC MORPHOLOGY: INCREASED
WBC: 7.3 10*3/uL (ref 4.0–10.5)
nRBC: 0 /100 WBC

## 2017-11-19 LAB — POCT I-STAT 3, ART BLOOD GAS (G3+)
ACID-BASE DEFICIT: 15 mmol/L — AB (ref 0.0–2.0)
Acid-base deficit: 17 mmol/L — ABNORMAL HIGH (ref 0.0–2.0)
BICARBONATE: 13.5 mmol/L — AB (ref 20.0–28.0)
Bicarbonate: 10.8 mmol/L — ABNORMAL LOW (ref 20.0–28.0)
O2 Saturation: 99 %
O2 Saturation: 98 %
PCO2 ART: 31.9 mmHg — AB (ref 32.0–48.0)
PH ART: 7.105 — AB (ref 7.350–7.450)
PO2 ART: 134 mmHg — AB (ref 83.0–108.0)
Patient temperature: 97.5
TCO2: 15 mmol/L — AB (ref 22–32)
TCO2: 12 mmol/L — AB (ref 22–32)
pCO2 arterial: 42.9 mmHg (ref 32.0–48.0)
pH, Arterial: 7.132 — CL (ref 7.350–7.450)
pO2, Arterial: 182 mmHg — ABNORMAL HIGH (ref 83.0–108.0)

## 2017-11-19 LAB — I-STAT CHEM 8, ED
BUN: 23 mg/dL — ABNORMAL HIGH (ref 6–20)
Calcium, Ion: 1.07 mmol/L — ABNORMAL LOW (ref 1.15–1.40)
Chloride: 104 mmol/L (ref 101–111)
Creatinine, Ser: 1.1 mg/dL — ABNORMAL HIGH (ref 0.44–1.00)
Glucose, Bld: 133 mg/dL — ABNORMAL HIGH (ref 65–99)
HEMATOCRIT: 40 % (ref 36.0–46.0)
Hemoglobin: 13.6 g/dL (ref 12.0–15.0)
Potassium: 3.6 mmol/L (ref 3.5–5.1)
SODIUM: 139 mmol/L (ref 135–145)
TCO2: 20 mmol/L — AB (ref 22–32)

## 2017-11-19 LAB — URINALYSIS, ROUTINE W REFLEX MICROSCOPIC
BILIRUBIN URINE: NEGATIVE
Glucose, UA: NEGATIVE mg/dL
Ketones, ur: 5 mg/dL — AB
Leukocytes, UA: NEGATIVE
NITRITE: NEGATIVE
PROTEIN: NEGATIVE mg/dL
Specific Gravity, Urine: 1.02 (ref 1.005–1.030)
pH: 5 (ref 5.0–8.0)

## 2017-11-19 LAB — URINALYSIS, MICROSCOPIC (REFLEX)

## 2017-11-19 LAB — TROPONIN I
TROPONIN I: 0.06 ng/mL — AB (ref <0.03)
Troponin I: 0.13 ng/mL (ref <0.03)

## 2017-11-19 LAB — MRSA PCR SCREENING: MRSA by PCR: NEGATIVE

## 2017-11-19 LAB — I-STAT TROPONIN, ED: TROPONIN I, POC: 0.04 ng/mL (ref 0.00–0.08)

## 2017-11-19 LAB — CBC
HEMATOCRIT: 33.1 % — AB (ref 36.0–46.0)
Hemoglobin: 9.8 g/dL — ABNORMAL LOW (ref 12.0–15.0)
MCH: 25.3 pg — AB (ref 26.0–34.0)
MCHC: 29.6 g/dL — AB (ref 30.0–36.0)
MCV: 85.3 fL (ref 78.0–100.0)
Platelets: 228 10*3/uL (ref 150–400)
RBC: 3.88 MIL/uL (ref 3.87–5.11)
RDW: 18.9 % — ABNORMAL HIGH (ref 11.5–15.5)
WBC: 8.4 10*3/uL (ref 4.0–10.5)

## 2017-11-19 LAB — TYPE AND SCREEN
ABO/RH(D): O POS
Antibody Screen: NEGATIVE

## 2017-11-19 LAB — PROTIME-INR
INR: 1.66
INR: 1.9
INR: 3.27
PROTHROMBIN TIME: 19.4 s — AB (ref 11.4–15.2)
Prothrombin Time: 21.6 seconds — ABNORMAL HIGH (ref 11.4–15.2)
Prothrombin Time: 33.1 seconds — ABNORMAL HIGH (ref 11.4–15.2)

## 2017-11-19 LAB — MAGNESIUM
MAGNESIUM: 1.8 mg/dL (ref 1.7–2.4)
MAGNESIUM: 2.1 mg/dL (ref 1.7–2.4)

## 2017-11-19 LAB — LACTIC ACID, PLASMA
Lactic Acid, Venous: 6.6 mmol/L (ref 0.5–1.9)
Lactic Acid, Venous: 14 mmol/L (ref 0.5–1.9)

## 2017-11-19 LAB — PROCALCITONIN: PROCALCITONIN: 16.23 ng/mL

## 2017-11-19 LAB — CORTISOL: Cortisol, Plasma: 9.7 ug/dL

## 2017-11-19 LAB — I-STAT CG4 LACTIC ACID, ED: LACTIC ACID, VENOUS: 8.84 mmol/L — AB (ref 0.5–1.9)

## 2017-11-19 LAB — PHOSPHORUS: Phosphorus: 3.7 mg/dL (ref 2.5–4.6)

## 2017-11-19 SURGERY — LAPAROTOMY, EXPLORATORY
Anesthesia: General | Site: Abdomen

## 2017-11-19 MED ORDER — NOREPINEPHRINE BITARTRATE 1 MG/ML IV SOLN
0.0000 ug/min | INTRAVENOUS | Status: DC
  Administered 2017-11-19: 40 ug/min via INTRAVENOUS
  Filled 2017-11-19: qty 4

## 2017-11-19 MED ORDER — NOREPINEPHRINE BITARTRATE 1 MG/ML IV SOLN
0.0000 ug/min | INTRAVENOUS | Status: AC
  Filled 2017-11-19: qty 4

## 2017-11-19 MED ORDER — DEXMEDETOMIDINE HCL IN NACL 200 MCG/50ML IV SOLN
0.4000 ug/kg/h | INTRAVENOUS | Status: DC
  Administered 2017-11-19: 0.7 ug/kg/h via INTRAVENOUS
  Filled 2017-11-19 (×2): qty 50

## 2017-11-19 MED ORDER — SUCCINYLCHOLINE CHLORIDE 200 MG/10ML IV SOSY
PREFILLED_SYRINGE | INTRAVENOUS | Status: AC
Start: 2017-11-19 — End: ?
  Filled 2017-11-19: qty 10

## 2017-11-19 MED ORDER — SODIUM CHLORIDE 0.9 % IV SOLN: INTRAVENOUS | Status: DC

## 2017-11-19 MED ORDER — CHLORHEXIDINE GLUCONATE CLOTH 2 % EX PADS: 6.0000 | MEDICATED_PAD | Freq: Every day | CUTANEOUS | Status: DC

## 2017-11-19 MED ORDER — ROCURONIUM BROMIDE 100 MG/10ML IV SOLN
INTRAVENOUS | Status: DC | PRN
  Administered 2017-11-19: 50 mg via INTRAVENOUS
  Administered 2017-11-19: 20 mg via INTRAVENOUS

## 2017-11-19 MED ORDER — SUCCINYLCHOLINE CHLORIDE 20 MG/ML IJ SOLN
INTRAMUSCULAR | Status: DC | PRN
  Administered 2017-11-19: 80 mg via INTRAVENOUS

## 2017-11-19 MED ORDER — NOREPINEPHRINE BITARTRATE 1 MG/ML IV SOLN
0.0000 ug/min | INTRAVENOUS | Status: DC
  Administered 2017-11-19: 40 ug/min via INTRAVENOUS
  Administered 2017-11-19: 30 ug/min via INTRAVENOUS
  Administered 2017-11-19: 0 ug/min via INTRAVENOUS
  Administered 2017-11-19: 20 ug/min via INTRAVENOUS
  Administered 2017-11-19: 40 ug/min via INTRAVENOUS
  Filled 2017-11-19 (×5): qty 4

## 2017-11-19 MED ORDER — POTASSIUM CHLORIDE 10 MEQ/100ML IV SOLN
10.0000 meq | INTRAVENOUS | Status: AC
  Administered 2017-11-19 (×4): 10 meq via INTRAVENOUS
  Filled 2017-11-19 (×5): qty 100

## 2017-11-19 MED ORDER — VANCOMYCIN HCL IN DEXTROSE 1-5 GM/200ML-% IV SOLN
1000.0000 mg | INTRAVENOUS | Status: DC
  Filled 2017-11-19: qty 200

## 2017-11-19 MED ORDER — FENTANYL CITRATE (PF) 100 MCG/2ML IJ SOLN
INTRAMUSCULAR | Status: DC | PRN
  Administered 2017-11-19: 50 ug via INTRAVENOUS

## 2017-11-19 MED ORDER — LORAZEPAM BOLUS VIA INFUSION: 2.0000 mg | INTRAVENOUS | Status: DC | PRN

## 2017-11-19 MED ORDER — VECURONIUM BROMIDE 10 MG IV SOLR
INTRAVENOUS | Status: AC
Start: 2017-11-19 — End: ?
  Filled 2017-11-19: qty 10

## 2017-11-19 MED ORDER — NOREPINEPHRINE BITARTRATE 1 MG/ML IV SOLN
INTRAVENOUS | Status: DC | PRN
  Administered 2017-11-19: 7.5 ug/min via INTRAVENOUS

## 2017-11-19 MED ORDER — SODIUM CHLORIDE 0.9 % IV SOLN
1250.0000 mg | Freq: Once | INTRAVENOUS | Status: AC
  Administered 2017-11-19: 1250 mg via INTRAVENOUS
  Filled 2017-11-19: qty 1250

## 2017-11-19 MED ORDER — DEXMEDETOMIDINE HCL IN NACL 200 MCG/50ML IV SOLN: INTRAVENOUS | Status: DC | PRN

## 2017-11-19 MED ORDER — SODIUM CHLORIDE 0.9 % IV SOLN
0.0000 ug/min | INTRAVENOUS | Status: DC
  Filled 2017-11-19 (×6): qty 1

## 2017-11-19 MED ORDER — SODIUM CHLORIDE 0.9 % IV SOLN
0.0000 ug/min | INTRAVENOUS | Status: DC
  Filled 2017-11-19: qty 4

## 2017-11-19 MED ORDER — SODIUM CHLORIDE 0.9 % IV SOLN
1.0000 g | Freq: Once | INTRAVENOUS | Status: AC
  Administered 2017-11-19: 1 g via INTRAVENOUS
  Filled 2017-11-19: qty 10

## 2017-11-19 MED ORDER — ALBUTEROL SULFATE (2.5 MG/3ML) 0.083% IN NEBU: 2.5000 mg | INHALATION_SOLUTION | RESPIRATORY_TRACT | Status: DC | PRN

## 2017-11-19 MED ORDER — CIPROFLOXACIN IN D5W 400 MG/200ML IV SOLN
400.0000 mg | INTRAVENOUS | Status: AC
  Filled 2017-11-19: qty 200

## 2017-11-19 MED ORDER — FENTANYL CITRATE (PF) 100 MCG/2ML IJ SOLN
INTRAMUSCULAR | Status: AC
  Administered 2017-11-19: 50 ug
  Filled 2017-11-19: qty 2

## 2017-11-19 MED ORDER — ONDANSETRON HCL 4 MG/2ML IJ SOLN
INTRAMUSCULAR | Status: AC
Start: 2017-11-19 — End: ?
  Filled 2017-11-19: qty 4

## 2017-11-19 MED ORDER — ALBUMIN HUMAN 5 % IV SOLN
INTRAVENOUS | Status: DC | PRN
  Administered 2017-11-19 (×2): via INTRAVENOUS

## 2017-11-19 MED ORDER — 0.9 % SODIUM CHLORIDE (POUR BTL) OPTIME
TOPICAL | Status: DC | PRN
  Administered 2017-11-19 (×12): 1000 mL

## 2017-11-19 MED ORDER — DEXAMETHASONE SODIUM PHOSPHATE 10 MG/ML IJ SOLN
INTRAMUSCULAR | Status: AC
  Filled 2017-11-19: qty 2

## 2017-11-19 MED ORDER — ONDANSETRON HCL 4 MG/2ML IJ SOLN
INTRAMUSCULAR | Status: AC
  Administered 2017-11-19: 4 mg
  Filled 2017-11-19: qty 2

## 2017-11-19 MED ORDER — SODIUM CHLORIDE 0.9 % IV BOLUS (SEPSIS)
250.0000 mL | Freq: Once | INTRAVENOUS | Status: AC
  Administered 2017-11-19: 250 mL via INTRAVENOUS

## 2017-11-19 MED ORDER — CHLORHEXIDINE GLUCONATE CLOTH 2 % EX PADS
6.0000 | MEDICATED_PAD | Freq: Once | CUTANEOUS | Status: AC
  Administered 2017-11-19: 6 via TOPICAL

## 2017-11-19 MED ORDER — SODIUM BICARBONATE 8.4 % IV SOLN
INTRAVENOUS | Status: AC
  Administered 2017-11-19: 50 meq
  Filled 2017-11-19: qty 50

## 2017-11-19 MED ORDER — LIDOCAINE HCL (CARDIAC) 20 MG/ML IV SOLN
INTRAVENOUS | Status: AC
  Filled 2017-11-19: qty 10

## 2017-11-19 MED ORDER — FENTANYL CITRATE (PF) 250 MCG/5ML IJ SOLN
INTRAMUSCULAR | Status: AC
  Filled 2017-11-19: qty 5

## 2017-11-19 MED ORDER — AZTREONAM 2 G IJ SOLR
2.0000 g | Freq: Once | INTRAMUSCULAR | Status: AC
  Administered 2017-11-19: 2 g via INTRAVENOUS
  Filled 2017-11-19: qty 2

## 2017-11-19 MED ORDER — FENTANYL BOLUS VIA INFUSION
50.0000 ug | INTRAVENOUS | Status: DC | PRN
  Filled 2017-11-19: qty 200

## 2017-11-19 MED ORDER — SODIUM CHLORIDE 0.9 % IV SOLN
0.0000 ug/min | INTRAVENOUS | Status: DC
  Administered 2017-11-19 (×2): 75 ug/min via INTRAVENOUS
  Administered 2017-11-19: 400 ug/min via INTRAVENOUS
  Administered 2017-11-19: 50 ug/min via INTRAVENOUS
  Administered 2017-11-19: 200 ug/min via INTRAVENOUS
  Filled 2017-11-19 (×3): qty 1
  Filled 2017-11-19: qty 10
  Filled 2017-11-19: qty 1
  Filled 2017-11-19: qty 10

## 2017-11-19 MED ORDER — METRONIDAZOLE IN NACL 5-0.79 MG/ML-% IV SOLN
500.0000 mg | INTRAVENOUS | Status: AC
  Filled 2017-11-19: qty 100

## 2017-11-19 MED ORDER — LIDOCAINE HCL (CARDIAC) 20 MG/ML IV SOLN
INTRAVENOUS | Status: DC | PRN
  Administered 2017-11-19: 40 mg via INTRAVENOUS

## 2017-11-19 MED ORDER — NOREPINEPHRINE BITARTRATE 1 MG/ML IV SOLN
0.0000 ug/min | Freq: Once | INTRAVENOUS | Status: AC
  Administered 2017-11-19: 5 ug/min via INTRAVENOUS
  Filled 2017-11-19: qty 4

## 2017-11-19 MED ORDER — METRONIDAZOLE IN NACL 5-0.79 MG/ML-% IV SOLN
500.0000 mg | Freq: Three times a day (TID) | INTRAVENOUS | Status: DC
  Administered 2017-11-19: 500 mg via INTRAVENOUS
  Filled 2017-11-19 (×4): qty 100

## 2017-11-19 MED ORDER — SODIUM CHLORIDE 0.9 % IV SOLN
250.0000 mL | INTRAVENOUS | Status: DC | PRN
  Administered 2017-11-19: 08:00:00 via INTRAVENOUS

## 2017-11-19 MED ORDER — SODIUM CHLORIDE 0.9 % IV BOLUS (SEPSIS)
1000.0000 mL | Freq: Once | INTRAVENOUS | Status: AC
  Administered 2017-11-19: 1000 mL via INTRAVENOUS

## 2017-11-19 MED ORDER — ASPIRIN 81 MG PO CHEW: 81.0000 mg | CHEWABLE_TABLET | Freq: Every day | ORAL | Status: DC

## 2017-11-19 MED ORDER — LACTATED RINGERS IV BOLUS
500.0000 mL | Freq: Once | INTRAVENOUS | Status: AC
  Administered 2017-11-19: 500 mL via INTRAVENOUS

## 2017-11-19 MED ORDER — VANCOMYCIN HCL IN DEXTROSE 1-5 GM/200ML-% IV SOLN: 1000.0000 mg | Freq: Once | INTRAVENOUS | Status: DC

## 2017-11-19 MED ORDER — VANCOMYCIN HCL IN DEXTROSE 1-5 GM/200ML-% IV SOLN: 1000.0000 mg | INTRAVENOUS | Status: DC

## 2017-11-19 MED ORDER — HEPARIN SODIUM (PORCINE) 5000 UNIT/ML IJ SOLN: 5000.0000 [IU] | Freq: Three times a day (TID) | INTRAMUSCULAR | Status: DC

## 2017-11-19 MED ORDER — STERILE WATER FOR INJECTION IV SOLN
INTRAVENOUS | Status: DC
  Administered 2017-11-19 (×2): via INTRAVENOUS
  Filled 2017-11-19 (×5): qty 850

## 2017-11-19 MED ORDER — PHENYLEPHRINE HCL 10 MG/ML IJ SOLN
INTRAMUSCULAR | Status: DC | PRN
  Administered 2017-11-19: 40 ug via INTRAVENOUS
  Administered 2017-11-19 (×2): 80 ug via INTRAVENOUS
  Administered 2017-11-19: 40 ug via INTRAVENOUS
  Administered 2017-11-19: 80 ug via INTRAVENOUS
  Administered 2017-11-19: 120 ug via INTRAVENOUS
  Administered 2017-11-19: 40 ug via INTRAVENOUS

## 2017-11-19 MED ORDER — ROCURONIUM BROMIDE 10 MG/ML (PF) SYRINGE
PREFILLED_SYRINGE | INTRAVENOUS | Status: AC
  Filled 2017-11-19: qty 10

## 2017-11-19 MED ORDER — FENTANYL 2500MCG IN NS 250ML (10MCG/ML) PREMIX INFUSION
100.0000 ug/h | INTRAVENOUS | Status: DC
  Administered 2017-11-19: 100 ug/h via INTRAVENOUS
  Filled 2017-11-19: qty 250

## 2017-11-19 MED ORDER — ETOMIDATE 2 MG/ML IV SOLN
INTRAVENOUS | Status: DC | PRN
  Administered 2017-11-19: 12 mg via INTRAVENOUS

## 2017-11-19 MED ORDER — PANTOPRAZOLE SODIUM 40 MG IV SOLR
40.0000 mg | INTRAVENOUS | Status: DC
  Administered 2017-11-19: 40 mg via INTRAVENOUS
  Filled 2017-11-19: qty 40

## 2017-11-19 MED ORDER — LEVOFLOXACIN IN D5W 750 MG/150ML IV SOLN
750.0000 mg | Freq: Once | INTRAVENOUS | Status: AC
  Administered 2017-11-19: 750 mg via INTRAVENOUS
  Filled 2017-11-19: qty 150

## 2017-11-19 MED ORDER — VECURONIUM BROMIDE 10 MG IV SOLR
INTRAVENOUS | Status: DC | PRN
  Administered 2017-11-19: 5 mg via INTRAVENOUS

## 2017-11-19 MED ORDER — SODIUM CHLORIDE 0.9 % IJ SOLN
INTRAMUSCULAR | Status: AC
  Filled 2017-11-19: qty 10

## 2017-11-19 MED ORDER — CHLORHEXIDINE GLUCONATE CLOTH 2 % EX PADS: 6.0000 | MEDICATED_PAD | Freq: Once | CUTANEOUS | Status: DC

## 2017-11-19 MED ORDER — NOREPINEPHRINE BITARTRATE 1 MG/ML IV SOLN
0.0000 ug/min | INTRAVENOUS | Status: DC
  Administered 2017-11-19: 20 ug/min via INTRAVENOUS
  Filled 2017-11-19: qty 4

## 2017-11-19 MED ORDER — LACTATED RINGERS IV SOLN
INTRAVENOUS | Status: DC | PRN
  Administered 2017-11-19 (×2): via INTRAVENOUS

## 2017-11-19 MED ORDER — IPRATROPIUM-ALBUTEROL 0.5-2.5 (3) MG/3ML IN SOLN
3.0000 mL | Freq: Four times a day (QID) | RESPIRATORY_TRACT | Status: DC
  Administered 2017-11-19: 3 mL via RESPIRATORY_TRACT
  Filled 2017-11-19: qty 3

## 2017-11-19 MED ORDER — SODIUM CHLORIDE 0.9 % IV SOLN
1.0000 g | Freq: Three times a day (TID) | INTRAVENOUS | Status: DC
  Administered 2017-11-19: 1 g via INTRAVENOUS
  Filled 2017-11-19 (×3): qty 1

## 2017-11-19 MED ORDER — SODIUM CHLORIDE 0.9% FLUSH: 10.0000 mL | INTRAVENOUS | Status: DC | PRN

## 2017-11-19 MED ORDER — PHENYLEPHRINE 40 MCG/ML (10ML) SYRINGE FOR IV PUSH (FOR BLOOD PRESSURE SUPPORT)
PREFILLED_SYRINGE | INTRAVENOUS | Status: AC
  Filled 2017-11-19: qty 10

## 2017-11-19 MED ORDER — FLUCONAZOLE IN SODIUM CHLORIDE 400-0.9 MG/200ML-% IV SOLN
400.0000 mg | INTRAVENOUS | Status: DC
  Administered 2017-11-19: 400 mg via INTRAVENOUS
  Filled 2017-11-19 (×2): qty 200

## 2017-11-19 MED ORDER — NOREPINEPHRINE BITARTRATE 1 MG/ML IV SOLN
0.0000 ug/min | Freq: Once | INTRAVENOUS | Status: DC
  Filled 2017-11-19: qty 4

## 2017-11-19 MED ORDER — EPHEDRINE SULFATE 50 MG/ML IJ SOLN
INTRAMUSCULAR | Status: DC | PRN
  Administered 2017-11-19: 10 mg via INTRAVENOUS

## 2017-11-19 MED ORDER — PHENYLEPHRINE 40 MCG/ML (10ML) SYRINGE FOR IV PUSH (FOR BLOOD PRESSURE SUPPORT)
PREFILLED_SYRINGE | INTRAVENOUS | Status: AC
Start: 2017-11-19 — End: ?
  Filled 2017-11-19: qty 10

## 2017-11-19 SURGICAL SUPPLY — 52 items
BLADE CLIPPER SURG (BLADE) IMPLANT
CANISTER SUCT 3000ML PPV (MISCELLANEOUS) ×3 IMPLANT
CHLORAPREP W/TINT 26ML (MISCELLANEOUS) ×3 IMPLANT
COVER SURGICAL LIGHT HANDLE (MISCELLANEOUS) ×5 IMPLANT
DRAIN CHANNEL 19F RND (DRAIN) ×6 IMPLANT
DRAPE LAPAROSCOPIC ABDOMINAL (DRAPES) ×3 IMPLANT
DRAPE UTILITY XL STRL (DRAPES) ×2 IMPLANT
DRAPE WARM FLUID 44X44 (DRAPE) ×3 IMPLANT
DRSG OPSITE POSTOP 4X10 (GAUZE/BANDAGES/DRESSINGS) IMPLANT
DRSG OPSITE POSTOP 4X8 (GAUZE/BANDAGES/DRESSINGS) IMPLANT
ELECT BLADE 6.5 EXT (BLADE) ×2 IMPLANT
ELECT CAUTERY BLADE 6.4 (BLADE) ×2 IMPLANT
ELECT REM PT RETURN 9FT ADLT (ELECTROSURGICAL) ×3
ELECTRODE REM PT RTRN 9FT ADLT (ELECTROSURGICAL) ×1 IMPLANT
EVACUATOR SILICONE 100CC (DRAIN) ×6 IMPLANT
GLOVE BIO SURGEON STRL SZ7 (GLOVE) ×3 IMPLANT
GLOVE BIOGEL PI IND STRL 7.5 (GLOVE) ×1 IMPLANT
GLOVE BIOGEL PI INDICATOR 7.5 (GLOVE) ×2
GOWN STRL REUS W/ TWL LRG LVL3 (GOWN DISPOSABLE) ×2 IMPLANT
GOWN STRL REUS W/ TWL XL LVL3 (GOWN DISPOSABLE) IMPLANT
GOWN STRL REUS W/TWL LRG LVL3 (GOWN DISPOSABLE) ×6
GOWN STRL REUS W/TWL XL LVL3 (GOWN DISPOSABLE) ×12
KIT BASIN OR (CUSTOM PROCEDURE TRAY) ×3 IMPLANT
KIT OSTOMY DRAINABLE 2.75 STR (WOUND CARE) ×4 IMPLANT
KIT TURNOVER KIT B (KITS) ×3 IMPLANT
LIGASURE IMPACT 36 18CM CVD LR (INSTRUMENTS) ×2 IMPLANT
NS IRRIG 1000ML POUR BTL (IV SOLUTION) ×6 IMPLANT
PACK GENERAL/GYN (CUSTOM PROCEDURE TRAY) ×3 IMPLANT
PAD ARMBOARD 7.5X6 YLW CONV (MISCELLANEOUS) ×3 IMPLANT
RELOAD PROXIMATE 75MM BLUE (ENDOMECHANICALS) ×3 IMPLANT
RELOAD STAPLE 75 3.8 BLU REG (ENDOMECHANICALS) IMPLANT
SPECIMEN JAR LARGE (MISCELLANEOUS) IMPLANT
SPONGE LAP 18X18 X RAY DECT (DISPOSABLE) ×8 IMPLANT
STAPLER PROXIMATE 75MM BLUE (STAPLE) ×2 IMPLANT
STAPLER VISISTAT 35W (STAPLE) ×3 IMPLANT
SUCTION POOLE TIP (SUCTIONS) ×3 IMPLANT
SUT ETHILON 2 0 FS 18 (SUTURE) ×6 IMPLANT
SUT NOVA 1 T20/GS 25DT (SUTURE) ×2 IMPLANT
SUT PDS AB 1 TP1 96 (SUTURE) ×6 IMPLANT
SUT SILK 2 0 (SUTURE) ×3
SUT SILK 2 0 SH CR/8 (SUTURE) ×3 IMPLANT
SUT SILK 2 0SH CR/8 30 (SUTURE) ×2 IMPLANT
SUT SILK 2-0 18XBRD TIE 12 (SUTURE) ×1 IMPLANT
SUT SILK 3 0 (SUTURE) ×3
SUT SILK 3 0 SH CR/8 (SUTURE) ×3 IMPLANT
SUT SILK 3-0 18XBRD TIE 12 (SUTURE) ×1 IMPLANT
SUT VIC AB 3-0 SH 18 (SUTURE) ×2 IMPLANT
SUT VIC AB 3-0 SH 27 (SUTURE)
SUT VIC AB 3-0 SH 27X BRD (SUTURE) IMPLANT
TOWEL OR 17X26 10 PK STRL BLUE (TOWEL DISPOSABLE) ×3 IMPLANT
TRAY FOLEY W/METER SILVER 16FR (SET/KITS/TRAYS/PACK) ×3 IMPLANT
YANKAUER SUCT BULB TIP NO VENT (SUCTIONS) IMPLANT

## 2017-11-21 LAB — URINE CULTURE

## 2017-11-22 ENCOUNTER — Encounter (HOSPITAL_COMMUNITY): Payer: Self-pay | Admitting: Surgery

## 2017-11-24 LAB — CULTURE, BLOOD (ROUTINE X 2)
CULTURE: NO GROWTH
CULTURE: NO GROWTH
SPECIAL REQUESTS: ADEQUATE
Special Requests: ADEQUATE

## 2017-12-15 NOTE — ED Triage Notes (Signed)
Patient c/o abd pain; abd distended. Alert and oriented x4.

## 2017-12-15 NOTE — Progress Notes (Signed)
Time of death 2017. Confirmed by Kathlene CoteJamie Daryon Remmert, RN and Bartholomew CrewsAmanda Troxler, RN. Family at bedside.

## 2017-12-15 NOTE — ED Provider Notes (Signed)
TIME SEEN: 5:10 AM  CHIEF COMPLAINT: Abdominal pain  HPI: Patient is an 84 year old female with history of hypertension, seizures who presents to the emergency department with abdominal pain that started 4 hours prior to arrival.  Brought in by [facility].  Was found to be hypoxic, hypotensive with distended abdomen and mottling of her skin.  She denies chest pain but states she does feel short of breath.  Had nausea vomiting last week.  No diarrhea.  Unsure of her last bowel movement.  Not on antiplatelets or anticoagulants per her report.  Per son, patient has had previous CMV colitis and intestinal stricture.  She is status post cholecystectomy.  ROS: Level 5 caveat secondary to acuity  PAST MEDICAL HISTORY/PAST SURGICAL HISTORY:  Past Medical History:  Diagnosis Date  . Abnormal barium swallow 09/1999  . Arthritis   . Asthma   . Carpal tunnel syndrome   . CMV colitis (HCC)   . Depression   . Erythema nodosum   . H/O Doppler ultrasound    LEFT LEG 11/1997  . Hypertension   . Psoriasis   . Rosacea   . Seizures (HCC) 1980  . Swallowing disorder     MEDICATIONS:  Prior to Admission medications   Medication Sig Start Date End Date Taking? Authorizing Provider  aspirin EC 81 MG tablet Take 81 mg by mouth at bedtime.     [provider]  Cholecalciferol (VITAMIN D3) 2000 units TABS Take 2,000 Units by mouth daily.     [provider]  dronabinol (MARINOL) 2.5 MG capsule Take 1 capsule (2.5 mg total) by mouth 2 (two) times daily before lunch and supper. 08/25/17   Albertine Grates, MD  furosemide (LASIX) 20 MG tablet Take 1 tablet (20 mg total) daily by mouth. 06/25/17 09/23/17  Allred, Fayrene Fearing, MD  Hydrocortisone (GERHARDT'S BUTT CREAM) CREA Apply 1 application topically 4 (four) times daily as needed for irritation. 05/10/17   Garth Bigness, MD  Multiple Vitamins-Minerals (PRESERVISION AREDS PO) Take 1 tablet by mouth 2 (two) times daily.    [provider]   pantoprazole (PROTONIX) 40 MG tablet Take 1 tablet (40 mg total) by mouth 2 (two) times daily. 08/25/17   Albertine Grates, MD  potassium chloride SA (K-DUR,KLOR-CON) 20 MEQ tablet Take 1 tablet (20 mEq total) by mouth daily. 05/24/17   Tereso Newcomer T, PA-C  predniSONE (DELTASONE) 20 MG tablet Take 2 tablets (40 mg total) by mouth daily with breakfast. X 5 days, then take 1.5 tabs for 5 days then 1 tab daily until complete 09/02/17   Judyann Munson, MD  saccharomyces boulardii (FLORASTOR) 250 MG capsule Take 1 capsule (250 mg total) by mouth 2 (two) times daily. 05/10/17   Garth Bigness, MD    ALLERGIES:  Allergies  Allergen Reactions  . Celebrex [Celecoxib] Other (See Comments)    LEG ACHING   . Codeine Nausea And Vomiting  . Cymbalta [Duloxetine Hcl] Other (See Comments)    Sensitive per MAR  . Doxycycline Swelling and Other (See Comments)    Facial swelling  . Meloxicam Other (See Comments)    TUMMY ACHE, INEFFECTIVE  . Nortriptyline Other (See Comments)    unknown  . Percocet [Oxycodone-Acetaminophen] Nausea And Vomiting  . Penicillins Rash and Other (See Comments)    Has patient had a PCN reaction causing immediate rash, facial/tongue/throat swelling, SOB or lightheadedness with hypotension: No Has patient had a PCN reaction causing severe rash involving mucus membranes or skin necrosis: No Has patient  had a PCN reaction that required hospitalization No Has patient had a PCN reaction occurring within the last 10 years: No If all of the above answers are "NO", then may proceed with Cephalosporin use.    SOCIAL HISTORY:  Social History   Tobacco Use  . Smoking status: Former Smoker    Last attempt to quit: 1982    Years since quitting: 37.2  . Smokeless tobacco: Never Used  Substance Use Topics  . Alcohol use: Yes    Comment: 1-2 times per week    FAMILY HISTORY: Family History  Problem Relation Age of Onset  . CVA Mother   . Stroke Mother   . Parkinson's disease  Father   . Leukemia Brother   . CVA Maternal Grandmother   . Stroke Maternal Grandmother   . Peripheral vascular disease Maternal Grandfather   . Breast cancer Paternal Grandmother [id number]  . Diabetes Paternal Grandmother   . CVA Paternal Grandfather   . Stroke Paternal Grandfather   . Pancreatic cancer Paternal Aunt   . Stroke Paternal Aunt   . Lung cancer Paternal Uncle   . Pancreatic cancer Brother   . Coronary artery disease Brother     EXAM: BP (!) 79/53 (BP Location: Right Arm)   Pulse (!) 102   Resp (!) 41   Ht 5\' 4"  (1.626 m)   Wt 66.2 kg (145 lb 15.1 oz)   SpO2 98%   BMI 25.05 kg/m  Rectal temp 97.4 F CONSTITUTIONAL: Alert and oriented x3, drowsy but arousable, nontoxic-appearing, hypotensive HEAD: Normocephalic EYES: Conjunctivae clear, pupils appear equal, EOMI ENT: normal nose; moist mucous membranes NECK: Supple, no meningismus, no nuchal rigidity, no LAD  CARD: RRR; S1 and S2 appreciated; no murmurs, no clicks, no rubs, no gallops RESP: Normal chest excursion without splinting or tachypnea; breath sounds clear and equal bilaterally; no wheezes, no rhonchi, no rales, no hypoxia or respiratory distress, speaking full sentences ABD/GI: Distended abdomen with tympany, diffusely tender to palpation RECTAL:  Normal rectal tone, no gross blood or melena, no hemorrhoids appreciated, nontender rectal exam, no fecal impaction, normal brown appearing stool on examination BACK:  The back appears normal and is non-tender to palpation, there is no CVA tenderness EXT: Normal ROM in all joints; non-tender to palpation; no edema; normal capillary refill; no cyanosis, no calf tenderness or swelling    SKIN: Mottling noted diffusely NEURO: Moves all extremities equally   MEDICAL DECISION MAKING: Patient here with hypotension, hypoxia, mottled appearing skin.  She is toxic appearing on exam.  Afebrile.  Abdomen is distended and diffusely tender.  I am concerned for perforation.  Fast  exam performed at bedside that showed no free fluid and she appears to have slightly diminished ejection fraction.  No pericardial effusion.  Will obtain emergent x-rays, labs, cultures.  Will start IV fluids and broad-spectrum antibiotics.  This time she reports she is a DNR/DNI.  We discussed possibility of vasopressors and central line as she states she would like to think about this.  ED PROGRESS: X-ray is free air under the abdomen.  Patient now agrees to central line and vasopressors as she is still hypotensive despite IV fluids being started.  Pressors have been started through peripheral line her blood pressure has improved.  She is still mentating well but is drowsy.  Will give fentanyl and Zofran for symptomatic relief.  Will discuss with general surgery.  We have discussed risk and benefits of surgery she understands that she may not survive  despite full intervention but if we did not take patient to surgery that she would definitely not survive.  5:08 AM  D/w Dr. Luisa Hart with general surgery.  Appreciate his help.  He will see patient in the emergency department.   Right subclavian central line placed.  X-ray confirmed placement with no pneumothorax.  Patient receiving broad-spectrum antibiotics, IV fluids, vasopressors.  She agrees to surgery and understands that she will need to be intubated before surgery and likely stay intubated for several days after.  She is okay with this but states she is still a DNR.  Surgery has asked for critical care admission.  6:00 AM Discussed patient's case with critical care physician, Dr. Warrick Parisian.  I have recommended admission and patient (and family if present) agree with this plan. Admitting physician will place admission orders.   I reviewed all nursing notes, vitals, pertinent previous records, EKGs, lab and urine results, imaging (as available).   Patient's son has been updated and is at bedside.    EKG Interpretation  Date/Time:  Friday November 19 2017 04:44:10 EDT Ventricular Rate:  104 PR Interval:    QRS Duration: 87 QT Interval:  336 QTC Calculation: 442 R Axis:   73 Text Interpretation:  Sinus tachycardia Ventricular premature complex Aberrant complex LAE, consider biatrial enlargement Abnormal R-wave progression, early transition Baseline wander in lead(s) V1 Confirmed by Rochele Raring [phone]) on 2017/11/24 5:11:47 AM        .CRITICAL CARE Performed by: Baxter Hire Joory Gough   Total critical care time: 65 minutes  Critical care time was exclusive of separately billable procedures and treating other patients.  Critical care was necessary to treat or prevent imminent or life-threatening deterioration.  Critical care was time spent personally by me on the following activities: development of treatment plan with patient and/or surrogate as well as nursing, discussions with consultants, evaluation of patient's response to treatment, examination of patient, obtaining history from patient or surrogate, ordering and performing treatments and interventions, ordering and review of laboratory studies, ordering and review of radiographic studies, pulse oximetry and re-evaluation of patient's condition.    .Central Line Date/Time: 11/24/17 6:53 AM Performed by: Mialee Weyman, Layla Maw, DO Authorized by: Jaymi Tinner, Layla Maw, DO   Consent:    Consent obtained:  Emergent situation and verbal   Consent given by:  Patient   Risks discussed:  Incorrect placement, nerve damage, infection, bleeding and pneumothorax   Alternatives discussed:  No treatment Pre-procedure details:    Hand hygiene: Hand hygiene performed prior to insertion     Sterile barrier technique: All elements of maximal sterile technique followed     Skin preparation:  ChloraPrep   Skin preparation agent: Skin preparation agent completely dried prior to procedure   Anesthesia (see MAR for exact dosages):    Anesthesia method:  Local infiltration   Local anesthetic:  Lidocaine 1% w/o  epi Procedure details:    Location:  L subclavian   Site selection rationale:  Unable to obtain line in her femoral veins bilaterally as the artery lies over top of the vein on both right and left side   Patient position:  Trendelenburg   Procedural supplies:  Triple lumen   Landmarks identified: yes     Ultrasound guidance: no     Number of attempts:  2   Successful placement: yes   Post-procedure details:    Post-procedure:  Dressing applied and line sutured   Assessment:  Blood return through all ports, free fluid flow, no pneumothorax  on x-ray and placement verified by x-ray   Patient tolerance of procedure:  Tolerated well, no immediate complications      Roby Spalla, Layla Maw, DO Dec 18, 2017 1610

## 2017-12-15 NOTE — Progress Notes (Signed)
PULMONARY / CRITICAL CARE MEDICINE   Name: Sharon Gilmore MRN: [id number] DOB: September 25, 1933    ADMISSION DATE:  2017/12/19 CONSULTATION DATE:  12-19-2017  REFERRING MD:  Ward  CHIEF COMPLAINT:  Abd pain  HISTORY OF PRESENT ILLNESS:  Sharon Gilmore is a 84 y.o. female with PMH as outlined below. She was brought to [facility] ED 4/5 early AM due to abd pain, abd distention.  Symptoms started that night.  She had CXR that demonstrated free air under diaphragms.   She was seen by surgery who plans to take her to OR for ex-lap.  PCCM was asked to admit to ICU.  Of note, she has had GI issues since Sept 2018.  Had sigmoidoscopy that admission that showed multiple ulcerations.  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 Procedure was limited and had limited tissue obtained.  She was seen by ID and after discussion with GI, decision was made to treat empirically with ganciclovir for 14 days. Since then, she has continued to have diarrhea and "GI issues".  She is followed by Dr. Matthias Hughs as an outpatient and was scheduled to see him today 12-19-17.  SUBJECTIVE:   Per RN returned to ICU post paralytics and sedation- neuro exam limited Stat post-op labs pending, resent Precedex off 1420- no gag/cough No urine output, remains hypotensive on levophed at 40 mcg/min  VITAL SIGNS: BP (!) 94/24   Pulse (!) 59   Temp (!) 96.2 F (35.7 C) (Axillary)   Resp (!) 22   Ht 5\' 4"  (1.626 m)   Wt 162 lb 0.6 oz (73.5 kg)   SpO2 100%   BMI 27.81 kg/m   HEMODYNAMICS: CVP:  [11 mmHg-25 mmHg] 16 mmHg  VENTILATOR SETTINGS: Vent Mode: PRVC FiO2 (%):  [100 %] 100 % Set Rate:  [15 bmp] 15 bmp Vt Set:  [480 mL] 480 mL PEEP:  [5 cmH20] 5 cmH20 Plateau Pressure:  [18 cmH20] 18 cmH20  INTAKE / OUTPUT: I/O last 3 completed shifts: In: 2350 [IV Piggyback:2350] Out: -   PHYSICAL EXAMINATION: General:  Critically ill elderly female on MV HEENT: MM pale/ moist, pupils  3/reactive, absent corneals Neuro: precedex just off; unresponsive, no gag/cough CV: distant, AV paced 60's PULM: even/non-labored on MV, lungs bilaterally clear GI: distended, abd wound dressing dry, 2 JP RLQ, 1 JP LLQ, colotomy LLQ, no BS Extremities: cool/dry, mottled extremities  Skin: no rashes   LABS:  BMET Recent Labs  Lab 12-19-2017 0456 19-Dec-2017 0600  NA 139 139  K 3.6 3.3*  CL 104 110  CO2  --  16*  BUN 23* 20  CREATININE 1.10* 1.23*  GLUCOSE 133* 123*    Electrolytes Recent Labs  Lab 12-19-2017 0600  CALCIUM 6.8*  MG 1.8  PHOS 3.7    CBC Recent Labs  Lab 2017/12/19 0456 Dec 19, 2017 0600 12-19-17 1347  WBC  --  7.3 8.4  HGB 13.6 9.6* 9.8*  HCT 40.0 32.3* 33.1*  PLT  --  210 228    Coag's Recent Labs  Lab 12/19/2017 0600 Dec 19, 2017 0621  INR 1.66 1.90    Sepsis Markers Recent Labs  Lab 2017/12/19 0456 2017-12-19 0600 Dec 19, 2017 0800  LATICACIDVEN 8.84*  --  6.6*  PROCALCITON  --  16.23  --     ABG Recent Labs  Lab 2017-12-19 0706 12-19-17 1302  PHART 7.268* 7.105*  PCO2ART 33.2 42.9  PO2ART 147.0* 182.0*    Liver Enzymes Recent Labs  Lab  02-22-2018 0600  AST 35  ALT 19  ALKPHOS 64  BILITOT 0.9  ALBUMIN 1.7*    Cardiac Enzymes Recent Labs  Lab 02-22-2018 1000  TROPONINI 0.06*    Glucose No results for input(s): GLUCAP in the last 168 hours.  Imaging Dg Chest Port 1 View  Result Date: 12/03/2017 CLINICAL DATA:  Status post intubation EXAM: PORTABLE CHEST 1 VIEW COMPARISON:  June 18, 2018 FINDINGS: Endotracheal tube is noted in satisfactory position. Nasogastric catheter is noted within the stomach. Pacing device is again seen. The overall inspiratory effort is poor with bibasilar changes similar to that seen on the prior exam. No other focal abnormality is noted. Right central venous catheter is again seen and stable. IMPRESSION: Tubes and lines in satisfactory position. Bibasilar changes stable from the prior exam. Electronically Signed    By: Alcide CleverMark  Lukens M.D.   On: June 18, 2018 11:57   Dg Chest Portable 1 View  Result Date: 12/03/2017 CLINICAL DATA:  Central line placement EXAM: PORTABLE CHEST 1 VIEW COMPARISON:  June 18, 2018 FINDINGS: Interval placement of a right central venous catheter with tip over the cavoatrial junction region. No pneumothorax. Cardiac pacemaker. Shallow inspiration with atelectasis in the lung bases. Probable small right pleural effusion. Free air seen previously under the hemidiaphragms is less apparent on supine view. IMPRESSION: Right central venous catheter tip overlies the cavoatrial junction region. No pneumothorax. Shallow inspiration with atelectasis in the lung bases and probable right pleural effusion. Electronically Signed   By: Burman NievesWilliam  Stevens M.D.   On: June 18, 2018 06:03   Dg Chest Port 1 View  Result Date: 11/17/2017 CLINICAL DATA:  Abdominal distention for 4 hours. EXAM: PORTABLE CHEST 1 VIEW COMPARISON:  05/15/2017 FINDINGS: There is a large amount of free air under the hemidiaphragms. In the absence of any recent surgery, this is likely to indicate bowel perforation. Shallow inspiration with atelectasis in the lung bases. Heart size and pulmonary vascularity are normal for technique. No pneumothorax. No blunting of costophrenic angles. Cardiac pacemaker. IMPRESSION: Large amount of free air under the hemidiaphragms likely to represent bowel perforation unless there has been recent surgery. Shallow inspiration with atelectasis in the lung bases. These results were called by telephone at the time of interpretation on 11/18/2017 at 5:29 am to Dr. Rochele RaringKRISTEN WARD , who verbally acknowledged these results. Electronically Signed   By: Burman NievesWilliam  Stevens M.D.   On: June 18, 2018 05:31   Dg Abd Portable 1v  Result Date: 12/06/2017 CLINICAL DATA:  Abdominal distention for 4 hours. EXAM: PORTABLE ABDOMEN - 1 VIEW COMPARISON:  CT abdomen and pelvis 08/18/2017 FINDINGS: Limited field of view study excludes the hemidiaphragms  and the lower pelvis. Mildly distended stool-filled transverse colon. No small bowel distention. Amorphous gas collection over the epigastric region corresponding to free air, better seen on the chest radiograph obtained at the same time. Degenerative and postoperative changes in the spine. IMPRESSION: 1. Free intraperitoneal air, better seen on chest x-ray, suspicious for bowel perforation. 2. Mildly distended stool-filled colon.  No small bowel distention. Electronically Signed   By: Burman NievesWilliam  Stevens M.D.   On: June 18, 2018 05:33   STUDIES:  CXR 4/5 > free air under hemidiaphragms. AXR 4/5 > free intraperitoneal air.  CULTURES: Blood 4/5 >   ANTIBIOTICS: Fluconazole 4/5 >  Vanc 4/5 >  Flagyl 4/5 >   SIGNIFICANT EVENTS: 4/5 > admit.  LINES/TUBES: R [place] CVL 4/5 >  Art line pending 4/5 > OGT 4/5 Foley 4/5  DISCUSSION: 84 y.o. female admitted 4/5 with abd pain  due to bowel perf.  Being taken to OR by surgery for ex lap, sigmoid perf s/p colostomy.  Returns to ICU hypotensive in shock on MV  ASSESSMENT / PLAN:  PULMONARY A: Acute respiratory insufficiency in the post operative setting Hx asthma. P:   Full MV support 8 cc/kg Increase RR 22, repeat ABG in 45 mins CXR with ETT in satisfactory position Trend CXR and ABG VAP protocol  Duonebs PRN   CARDIOVASCULAR A:  Shock - due to sepsis from bowel perf.  Also some component hypovolemia from decreased PO + diarrhea. Hx HTN, AV block s/p pacemaker - currently AV paced  P:  Tele monitoring Continue levophed, goal MAP > 65, adding neo Goal CVP 10 Consider bedside TTE Trend troponin, lactate Hold ASA/ lasix   RENAL A:   AKI-  Hypocalcemia AGMA/ lactic acidosis  P:   LR 500 ml bolus x 1 HCO3 50 meq x 1 HCO3 @ 125. Maximize vent setting, repeat ABG pending Consider renal consult for CRRT Awaiting stat BMET Trend BMP / urinary output Replace electrolytes as indicated  GASTROINTESTINAL A:   Bowel perf.- sigmoid,  s/p ex lap/ colostomy GI prophylaxis. Nutrition. P:   Post op care per surgery. SUP: Pantoprazole. NPO Monitor JP output   HEMATOLOGIC A:   R/o ABLA- EBL 200 ml VTE Prophylaxis. P:  SCD's Pending post op coags/ CBC Transfuse for Hgb < 7  INFECTIOUS A:   Peritonitis - due to bowel perf. P:   Abx as above (fluconazole / vanc / flagyl).  Follow cultures as above. PCT algorithm to limit abx exposure. Trend WBC/ fever curve  ENDOCRINE A:   No acute issues. P:   CBG q 4 SSI if needed  NEUROLOGIC A:   Acute encephalopathy - ?sedation related in the setting of AKI/ r/o CNS event  Hx depression. - arrived post-op with paralytics on board limiting neuro exam - pt now with absent corneals, no gag or cough, +pupilary response P:   Hold precedex and sedation for adequate WUA Correct metabolic derangements as able for adequate neuro assessment If needed- resume prn fentanyl RASS -1 Consider head CT or TCD if poor neuro exam persist  Family updated: Son, Rosanne Ashing, updated at bedside of worsening condition.  Pt full DNR prior to but agreed to surgery.  Family state no CPR but agreeable to all other measures for now.   Interdisciplinary Family Meeting v Palliative Care Meeting:  Due by: 11/26/17.  CC time: 60 min  Posey Boyer, AGACNP-BC [facility] Pgr: [phone] or if no answer [phone] 26-Nov-2017, 2:33 PM

## 2017-12-15 NOTE — H&P (Signed)
PULMONARY / CRITICAL CARE MEDICINE   Name: Sharon Gilmore MRN: [id number] DOB: [birth year]    ADMISSION DATE:  20-Nov-2017 CONSULTATION DATE:  11-20-2017  REFERRING MD:  Ward  CHIEF COMPLAINT:  Abd pain  HISTORY OF PRESENT ILLNESS:  Sharon Gilmore is a 84 y.o. female with PMH as outlined below. She was brought to [facility] ED 4/5 early AM due to abd pain, abd distention.  Symptoms started that night.  She had CXR that demonstrated free air under diaphragms.   She was seen by surgery who plans to take her to OR for ex-lap.  PCCM was asked to admit to ICU.  Of note, she has had GI issues since Sept 2018.  Had sigmoidoscopy that admission that showed multiple ulcerations.  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 Procedure was limited and had limited tissue obtained.  She was seen by ID and after discussion with GI, decision was made to treat empirically with ganciclovir for 14 days. Since then, she has continued to have diarrhea and "GI issues".  She is followed by Dr. Matthias Hughs as an outpatient and was scheduled to see him today 20-Nov-2017.  PAST MEDICAL HISTORY :  She  has a past medical history of Abnormal barium swallow (09/1999), Arthritis, Asthma, Carpal tunnel syndrome, CMV colitis (HCC), Depression, Erythema nodosum, H/O Doppler ultrasound, Hypertension, Psoriasis, Rosacea, Seizures (HCC) (1980), and Swallowing disorder.  PAST SURGICAL HISTORY: She  has a past surgical history that includes Tonsillectomy; Cholecystectomy; Incisional hernia repair; Colonoscopy (2004); PACEMAKER IMPLANT (N/A, 03/09/2017); LEFT HEART CATH AND CORONARY ANGIOGRAPHY (N/A, 03/15/2017); Bilateral carpal tunnel release; Flexible sigmoidoscopy (N/A, 05/17/2017); Flexible sigmoidoscopy (N/A, 08/20/2017); and Breast excisional biopsy (Right).  Allergies  Allergen Reactions  . Celebrex [Celecoxib] Other (See Comments)    LEG ACHING   . Codeine Nausea And Vomiting  . Cymbalta  [Duloxetine Hcl] Other (See Comments)    Sensitive per MAR  . Doxycycline Swelling and Other (See Comments)    Facial swelling  . Meloxicam Other (See Comments)    TUMMY ACHE, INEFFECTIVE  . Nortriptyline Other (See Comments)    unknown  . Percocet [Oxycodone-Acetaminophen] Nausea And Vomiting  . Penicillins Rash and Other (See Comments)    Has patient had a PCN reaction causing immediate rash, facial/tongue/throat swelling, SOB or lightheadedness with hypotension: No Has patient had a PCN reaction causing severe rash involving mucus membranes or skin necrosis: No Has patient had a PCN reaction that required hospitalization No Has patient had a PCN reaction occurring within the last 10 years: No If all of the above answers are "NO", then may proceed with Cephalosporin use.    No current facility-administered medications on file prior to encounter.    Current Outpatient Medications on File Prior to Encounter  Medication Sig  . aspirin EC 81 MG tablet Take 81 mg by mouth at bedtime.   . Cholecalciferol (VITAMIN D3) 2000 units TABS Take 2,000 Units by mouth daily.   Marland Kitchen dronabinol (MARINOL) 2.5 MG capsule Take 1 capsule (2.5 mg total) by mouth 2 (two) times daily before lunch and supper.  . furosemide (LASIX) 20 MG tablet Take 1 tablet (20 mg total) daily by mouth.  . Hydrocortisone (GERHARDT'S BUTT CREAM) CREA Apply 1 application topically 4 (four) times daily as needed for irritation.  . Multiple Vitamins-Minerals (PRESERVISION AREDS PO) Take 1 tablet by mouth 2 (two) times daily.  . pantoprazole (PROTONIX) 40 MG tablet Take 1 tablet (40 mg total)  by mouth 2 (two) times daily.  . potassium chloride SA (K-DUR,KLOR-CON) 20 MEQ tablet Take 1 tablet (20 mEq total) by mouth daily.  . predniSONE (DELTASONE) 20 MG tablet Take 2 tablets (40 mg total) by mouth daily with breakfast. X 5 days, then take 1.5 tabs for 5 days then 1 tab daily until complete  . saccharomyces boulardii (FLORASTOR) 250 MG  capsule Take 1 capsule (250 mg total) by mouth 2 (two) times daily.    FAMILY HISTORY:  Her indicated that her mother is deceased. She indicated that her father is deceased. She indicated that only one of her three brothers is alive. She indicated that her maternal grandmother is deceased. She indicated that her maternal grandfather is deceased. She indicated that her paternal grandmother is deceased. She indicated that her paternal grandfather is deceased. She indicated that her paternal aunt is deceased. She indicated that her paternal uncle is deceased.   SOCIAL HISTORY: She  reports that she quit smoking about 37 years ago. She has never used smokeless tobacco. She reports that she drinks alcohol. She reports that she does not use drugs.  REVIEW OF SYSTEMS:   All negative; except for those that are bolded, which indicate positives.  Constitutional: weight loss, weight gain, night sweats, fevers, chills, fatigue, weakness.  HEENT: headaches, sore throat, sneezing, nasal congestion, post nasal drip, difficulty swallowing, tooth/dental problems, visual complaints, visual changes, ear aches. Neuro: difficulty with speech, weakness, numbness, ataxia. CV:  chest pain, orthopnea, PND, swelling in lower extremities, dizziness, palpitations, syncope.  Resp: cough, hemoptysis, dyspnea, wheezing. GI: heartburn, indigestion, abdominal pain, nausea, vomiting, diarrhea, constipation, change in bowel habits, loss of appetite, hematemesis, melena, hematochezia.  GU: dysuria, change in color of urine, urgency or frequency, flank pain, hematuria. MSK: joint pain or swelling, decreased range of motion. Psych: change in mood or affect, depression, anxiety, suicidal ideations, homicidal ideations. Skin: rash, itching, bruising.   SUBJECTIVE:  Has abd pain and distention.  VITAL SIGNS: BP (!) 79/53 (BP Location: Right Arm)   Pulse (!) 102   Resp (!) 41   Ht 5\' 4"  (1.626 m)   Wt 66.2 kg (145 lb 15.1 oz)    SpO2 98%   BMI 25.05 kg/m   HEMODYNAMICS:    VENTILATOR SETTINGS:    INTAKE / OUTPUT: No intake/output data recorded.   PHYSICAL EXAMINATION: General: Adult female, in NAD. Neuro: A&O x 3, non-focal.  HEENT: [place]/AT. EOMI, sclerae anicteric. Cardiovascular: RRR, no M/R/G.  Lungs: Respirations even and unlabored.  CTA bilaterally, No W/R/R. Abdomen: Abd distended.  BS hypoactive.  Tender throughout and hyperresonant. Musculoskeletal: No gross deformities, no edema.  Skin: Intact, warm, no rashes.  LABS:  BMET Recent Labs  Lab 12-15-2017 0456  NA 139  K 3.6  CL 104  BUN 23*  CREATININE 1.10*  GLUCOSE 133*    Electrolytes No results for input(s): CALCIUM, MG, PHOS in the last 168 hours.  CBC Recent Labs  Lab 12/15/2017 0456  HGB 13.6  HCT 40.0    Coag's No results for input(s): APTT, INR in the last 168 hours.  Sepsis Markers Recent Labs  Lab Dec 15, 2017 0456  LATICACIDVEN 8.84*    ABG No results for input(s): PHART, PCO2ART, PO2ART in the last 168 hours.  Liver Enzymes No results for input(s): AST, ALT, ALKPHOS, BILITOT, ALBUMIN in the last 168 hours.  Cardiac Enzymes No results for input(s): TROPONINI, PROBNP in the last 168 hours.  Glucose No results for input(s): GLUCAP in the last  168 hours.  Imaging Dg Chest Portable 1 View  Result Date: November 30, 2017 CLINICAL DATA:  Central line placement EXAM: PORTABLE CHEST 1 VIEW COMPARISON:  2017/11/30 FINDINGS: Interval placement of a right central venous catheter with tip over the cavoatrial junction region. No pneumothorax. Cardiac pacemaker. Shallow inspiration with atelectasis in the lung bases. Probable small right pleural effusion. Free air seen previously under the hemidiaphragms is less apparent on supine view. IMPRESSION: Right central venous catheter tip overlies the cavoatrial junction region. No pneumothorax. Shallow inspiration with atelectasis in the lung bases and probable right pleural effusion.  Electronically Signed   By: Burman Nieves M.D.   On: November 30, 2017 06:03   Dg Chest Port 1 View  Result Date: 2017/11/30 CLINICAL DATA:  Abdominal distention for 4 hours. EXAM: PORTABLE CHEST 1 VIEW COMPARISON:  05/15/2017 FINDINGS: There is a large amount of free air under the hemidiaphragms. In the absence of any recent surgery, this is likely to indicate bowel perforation. Shallow inspiration with atelectasis in the lung bases. Heart size and pulmonary vascularity are normal for technique. No pneumothorax. No blunting of costophrenic angles. Cardiac pacemaker. IMPRESSION: Large amount of free air under the hemidiaphragms likely to represent bowel perforation unless there has been recent surgery. Shallow inspiration with atelectasis in the lung bases. These results were called by telephone at the time of interpretation on 11-30-2017 at 5:29 am to Dr. Rochele Raring , who verbally acknowledged these results. Electronically Signed   By: Burman Nieves M.D.   On: 30-Nov-2017 05:31   Dg Abd Portable 1v  Result Date: 2017/11/30 CLINICAL DATA:  Abdominal distention for 4 hours. EXAM: PORTABLE ABDOMEN - 1 VIEW COMPARISON:  CT abdomen and pelvis 08/18/2017 FINDINGS: Limited field of view study excludes the hemidiaphragms and the lower pelvis. Mildly distended stool-filled transverse colon. No small bowel distention. Amorphous gas collection over the epigastric region corresponding to free air, better seen on the chest radiograph obtained at the same time. Degenerative and postoperative changes in the spine. IMPRESSION: 1. Free intraperitoneal air, better seen on chest x-ray, suspicious for bowel perforation. 2. Mildly distended stool-filled colon.  No small bowel distention. Electronically Signed   By: Burman Nieves M.D.   On: 11/30/2017 05:33     STUDIES:  CXR 4/5 > free air under hemidiaphragms. AXR 4/5 > free intraperitoneal air.  CULTURES: Blood 4/5 >   ANTIBIOTICS: Fluconazole 4/5 >  Vanc 4/5 >   Flagyl 4/5 >   SIGNIFICANT EVENTS: 4/5 > admit.  LINES/TUBES: R [place] CVL 4/5 >  Art line pending 4/5 >   DISCUSSION: 84 y.o. female admitted 4/5 with abd pain due to bowel perf.  Being taken to OR by surgery for ex lap.  ASSESSMENT / PLAN:  PULMONARY A: No acute issues. At risk to remain intubated post op. Hx asthma. P:   F/u post op if remains intubated. Albuterol PRN.  CARDIOVASCULAR A:  Shock - due to sepsis from bowel perf.  Also some component hypovolemia from decreased PO + diarrhea. Hx HTN. P:  Continue fluids. Continue levophed, goal MAP > 65. Trend troponin, lactate. Continue preadmission ASA. Hold preadmission furosemide.  RENAL A:   AKI. Hypocalcemia. P:   HCO3 @ 125. Check ABG. 1g Ca gluconate. BMP in AM.  GASTROINTESTINAL A:   Bowel perf. GI prophylaxis. Nutrition. P:   Surgery to take to OR today for ex lap. Post op care per surgery. SUP: Pantoprazole. NPO.  HEMATOLOGIC A:   VTE Prophylaxis. P:  SCD's /  heparin. Check coags. CBC in AM.  INFECTIOUS A:   Peritonitis - due to bowel perf. P:   Abx as above (fluconazole / vanc / flagyl).  Follow cultures as above. PCT algorithm to limit abx exposure.  ENDOCRINE A:   No acute issues. P:   SSI if glucose consistently > 180.  NEUROLOGIC A:   Hx depression. P:   No interventions required.  Family updated: Son updated at bedside.  Interdisciplinary Family Meeting v Palliative Care Meeting:  Due by: 11/26/17.  CC time: 35 min.   Rutherford Guysahul Desai, [place] - [facility] Pager: [phone]  or [phone] 11/17/2017, 6:07 AM

## 2017-12-15 NOTE — Progress Notes (Signed)
Patient's pacemaker deactivated and confirmed not working.

## 2017-12-15 NOTE — Anesthesia Postprocedure Evaluation (Signed)
Anesthesia Post Note  Patient: Sharon Gilmore  Procedure(s) Performed: EXPLORATORY LAPAROTOMY (N/A Abdomen) COLON RESECTION SIGMOID (N/A Abdomen) COLOSTOMY (N/A Abdomen)     Patient location during evaluation: ICU Anesthesia Type: General Level of consciousness: patient remains intubated per anesthesia plan Pain management: pain level controlled Respiratory status: patient remains intubated per anesthesia plan Cardiovascular status: stable Anesthetic complications: no    Last Vitals:  Vitals:   07/31/18 1330 07/31/18 1345  BP: (!) 71/18 (!) 94/24  Pulse: (!) 59 (!) 59  Resp: (!) 22 (!) 22  Temp:    SpO2: 100% 100%    Last Pain:  Vitals:   07/31/18 1215  TempSrc: Axillary  PainSc:                  Trevor IhaStephen A Houser

## 2017-12-15 NOTE — Anesthesia Procedure Notes (Signed)
Arterial Line Insertion Start/EndApril 28, 2019 7:50 AM, 12-12-17 8:07 AM Performed by: Adair Laundry, CRNA, CRNA  Patient location: OR. Right, radial was placed Catheter size: 20 G Hand hygiene performed  and maximum sterile barriers used   Attempts: 3 Procedure performed without using ultrasound guided technique. Following insertion, dressing applied and Biopatch. Post procedure assessment: normal and unchanged  Patient tolerated the procedure well with no immediate complications.

## 2017-12-15 NOTE — Significant Event (Signed)
PCCM Interval Note  NP called to bedside at request of family to clarify patient's code status.  Patient with prior full DNR/ state gold form present prior to surgery.  However, patient agreed to surgery and short-term support.  Patient has 5 sons, HPOA is [name] [phone].  We discussed her wishes of to proceed with current life support of mechanical ventilator/ reintubation if needed, pressors, aggressive medical management for the short term, however no CPR.  Code status changed to limited Code to reflect these changes.    Sharon Gilmore, AGACNP-BC  Pulmonary & Critical Care Pgr: [phone] or if no answer [phone] 11/17/2017, 11:48 AM

## 2017-12-15 NOTE — Procedures (Addendum)
Extubation Procedure Note  Patient Details:   Name: [name] DOB: Jul 15, 1934 MRN: [id number]   Airway Documentation:  AIRWAYS (Active)     AIRWAYS (Active)    Evaluation  O2 sats: stable throughout Complications: No apparent complications Patient did tolerate procedure well. Bilateral Breath Sounds: Diminished, Rhonchi   No, terminal extubation.  Pt extubated to RA per family request.    Berton BonBlanca E Limon Nunez 12/02/2017, 8:03 PM

## 2017-12-15 NOTE — Op Note (Signed)
EXPLORATORY LAPAROTOMY, COLON RESECTION SIGMOID, COLOSTOMY  Procedure Note  Sharon Gilmore 11/28/2017   Pre-op Diagnosis: perforated viscus     Post-op Diagnosis: perforated sigmoid colon  Procedure(s): EXPLORATORY LAPAROTOMY COLON RESECTION SIGMOID COLOSTOMY  Surgeon(s): Abigail MiyamotoBlackman, Drianna Chandran, MD Violeta Gelinashompson, Burke, MD  Anesthesia: General  Staff:  Circulator: Woodroe ModeHickling, Kelly A, RN Scrub Person: Gevena CottonHardesty, Xinia T  Estimated Blood Loss: 200 mL               Specimens: sent to path  Indications: This is an 84 year old female who presented with acute abdomen and free air.  She had Gilmore known distal sigmoid colon stricture.  The decision was made to proceed urgently to the operating room  Findings: Following the induction the patient was found to have anesthesia again extremely large rupture of her distal sigmoid colon with an extremely large amount of fecal contamination throughout her abdominal cavity.  Procedure: The patient was brought to the operating room and identified as the correct patient.  She is placed upon the operating table and general anesthesia was induced.  Her abdomen was then prepped and draped in usual sterile fashion.  I created Gilmore midline incision with Gilmore scalpel.  I took this down through the fashion peritoneum with electrocautery.  Upon any of the abdomen the patient was found to have Gilmore large amount of fecal contamination.  There was Gilmore large amount of stool all throughout the entire abdominal cavity.  There appeared to be Gilmore previous abscess in the pelvis.  There was small bowel stuck to this inflammatory rind which I was able to finger fracture off.  The perforation became apparent that it was in the distal sigmoid colon.  There was Gilmore very large perforation of the distal sigmoid colon which accomplished the almost entire circumference of the colon.  I transected the sigmoid colon proximal to this area I then took down the mesentery with the LigaSure.  Through the large  opening in the distal sigmoid colon I could feel Gilmore very tiny stricture in the rectum.  The rectum was fixated into the uterus and I was able to separate this.  Because of the inflammatory process I could not dissect further into the pelvis past the stricture.  I thus elected to close over the top of the stricture with several interrupted 2-0 silk sutures.  I then excised the rest of the redundant perforated mucosa that was just proximal to the stricture.  The colon specimen was sent to pathology for evaluation.  We mobilized the descending colon along the white line of Toldt.  I reevaluate the small bowel saw no evidence of perforation at the area of bowel stuck to the abscess.  We then irrigated the abdomen with 8-10 L of normal saline removing all visible feculent matter.  I then placed Gilmore 7519 [place] drain into the pelvis as well as 2 separate 19 [place] drains into the pericolic gutters going up behind the liver and spleen.  These were sutured in place with nylon sutures.  Hemostasis appeared to be achieved.  In the left midabdomen, we made Gilmore separate circular incision for the ostomy with Gilmore scalpel.  I dissected down through the subtenons tissue with the electrocautery.  I then opened up the fascia and peritoneum and pulled out the descending colon as an end colostomy.  The patient's midline fascia was then closed with running #1 looped PDS suture as well as several interrupted #1 Novafil sutures.  The open wound was then packed  with wet-to-dry saline gauze.  I then opened up the staple line at the ostomy and matured the ostomy circumferentially with 3-0 Vicryl sutures.  The ostomy appeared pink and well perfused.  An ostomy appliance was placed.  The patient appeared to tolerate the procedure.  She was left intubated and taken from the operating room to the intensive care unit.  All counts were correct at the end of the procedure.          Sharon Gilmore   Date: 15-Dec-2017  Time: 9:36 AM

## 2017-12-15 NOTE — Anesthesia Procedure Notes (Signed)
Procedure Name: Intubation Date/Time: 11-24-2017 7:45 AM Performed by: Neldon Newport, CRNA Pre-anesthesia Checklist: Timeout performed, Patient being monitored, Suction available, Emergency Drugs available and Patient identified Patient Re-evaluated:Patient Re-evaluated prior to induction Oxygen Delivery Method: Circle system utilized Preoxygenation: Pre-oxygenation with 100% oxygen Induction Type: IV induction Ventilation: Mask ventilation without difficulty Laryngoscope Size: Mac, 3 and Glidescope Grade View: Grade III Tube type: Oral Tube size: 7.0 mm Number of attempts: 2 Placement Confirmation: breath sounds checked- equal and bilateral,  positive ETCO2 and ETT inserted through vocal cords under direct vision Secured at: 22 cm Tube secured with: Tape Dental Injury: Teeth and Oropharynx as per pre-operative assessment  Difficulty Due To: Difficulty was unanticipated

## 2017-12-15 NOTE — Death Summary Note (Signed)
DEATH SUMMARY   Patient Details  Name: Sharon Gilmore MRN: [id number] DOB: Feb 20, 1934  Admission/Discharge Information   Admit Date:  2017/11/29  Date of Death: Date of Death: Nov 29, 2017  Time of Death: Time of Death: 01-03-2016  Length of Stay: 1  Referring Physician: Pearson Forster, MD   Reason(s) for Hospitalization  Bowel perforation.  Diagnoses  Preliminary cause of death:  Secondary Diagnoses (including complications and co-morbidities):  Active Problems:   Bowel perforation [facility])   Brief Hospital Course (including significant findings, care, treatment, and services provided and events leading to death)  Sharon Gilmore is a 84 y.o. year old female who presented with abdominal pain and hypotension.  He was found to be in distributive shock.  She was brought to the operating room and underwent laparotomy which found a bowel perforation with significant fecal soilage.  She was brought back to the intensive care unit and her condition continued to worsen with increasing vasopressor requirements.  Family had previously been informed by the surgeon of her grave prognosis.  We discussed her condition in the light of ongoing pressor requirements that it was unlikely that she would make a meaningful recovery.  It was agreed that a withdrawal of care was the most appropriate course of action.    Pertinent Labs and Studies  Significant Diagnostic Studies Dg Chest Port 1 View  Result Date: 29-Nov-2017 CLINICAL DATA:  Status post intubation EXAM: PORTABLE CHEST 1 VIEW COMPARISON:  11/29/17 FINDINGS: Endotracheal tube is noted in satisfactory position. Nasogastric catheter is noted within the stomach. Pacing device is again seen. The overall inspiratory effort is poor with bibasilar changes similar to that seen on the prior exam. No other focal abnormality is noted. Right central venous catheter is again seen and stable. IMPRESSION: Tubes and lines in satisfactory position. Bibasilar  changes stable from the prior exam. Electronically Signed   By: Alcide Clever M.D.   On: 11-29-2017 11:57   Dg Chest Portable 1 View  Result Date: 2017/11/29 CLINICAL DATA:  Central line placement EXAM: PORTABLE CHEST 1 VIEW COMPARISON:  2017/11/29 FINDINGS: Interval placement of a right central venous catheter with tip over the cavoatrial junction region. No pneumothorax. Cardiac pacemaker. Shallow inspiration with atelectasis in the lung bases. Probable small right pleural effusion. Free air seen previously under the hemidiaphragms is less apparent on supine view. IMPRESSION: Right central venous catheter tip overlies the cavoatrial junction region. No pneumothorax. Shallow inspiration with atelectasis in the lung bases and probable right pleural effusion. Electronically Signed   By: Burman Nieves M.D.   On: 2017-11-29 06:03   Dg Chest Port 1 View  Result Date: 2017/11/29 CLINICAL DATA:  Abdominal distention for 4 hours. EXAM: PORTABLE CHEST 1 VIEW COMPARISON:  05/15/2017 FINDINGS: There is a large amount of free air under the hemidiaphragms. In the absence of any recent surgery, this is likely to indicate bowel perforation. Shallow inspiration with atelectasis in the lung bases. Heart size and pulmonary vascularity are normal for technique. No pneumothorax. No blunting of costophrenic angles. Cardiac pacemaker. IMPRESSION: Large amount of free air under the hemidiaphragms likely to represent bowel perforation unless there has been recent surgery. Shallow inspiration with atelectasis in the lung bases. These results were called by telephone at the time of interpretation on 11/29/17 at 5:29 am to Dr. Rochele Raring , who verbally acknowledged these results. Electronically Signed   By: Burman Nieves M.D.   On: 2017/11/29 05:31   Dg Abd Portable 1v  Result  Date: 12/06/2017 CLINICAL DATA:  Abdominal distention for 4 hours. EXAM: PORTABLE ABDOMEN - 1 VIEW COMPARISON:  CT abdomen and pelvis 08/18/2017  FINDINGS: Limited field of view study excludes the hemidiaphragms and the lower pelvis. Mildly distended stool-filled transverse colon. No small bowel distention. Amorphous gas collection over the epigastric region corresponding to free air, better seen on the chest radiograph obtained at the same time. Degenerative and postoperative changes in the spine. IMPRESSION: 1. Free intraperitoneal air, better seen on chest x-ray, suspicious for bowel perforation. 2. Mildly distended stool-filled colon.  No small bowel distention. Electronically Signed   By: Burman NievesWilliam  Stevens M.D.   On: 10/06/2017 05:33    Microbiology No results found for this or any previous visit (from the past 240 hour(s)).  Lab Basic Metabolic Panel: No results for input(s): NA, K, CL, CO2, GLUCOSE, BUN, CREATININE, CALCIUM, MG, PHOS in the last 168 hours. Liver Function Tests: No results for input(s): AST, ALT, ALKPHOS, BILITOT, PROT, ALBUMIN in the last 168 hours. No results for input(s): LIPASE, AMYLASE in the last 168 hours. No results for input(s): AMMONIA in the last 168 hours. CBC: No results for input(s): WBC, NEUTROABS, HGB, HCT, MCV, PLT in the last 168 hours. Cardiac Enzymes: No results for input(s): CKTOTAL, CKMB, CKMBINDEX, TROPONINI in the last 168 hours. Sepsis Labs: No results for input(s): PROCALCITON, WBC, LATICACIDVEN in the last 168 hours.  Procedures/Operations  Laparotomy, central line insertion mechanical ventilation.  Sonya Gunnoe 12/02/2017, 5:55 PM

## 2017-12-15 NOTE — Progress Notes (Signed)
Patient's family members, including POA, ready to proceed with withdrawal of care. Patient extubated by respiratory therapy. Fentanyl infusion and all remaining infusions discontinued. Family at bedside.

## 2017-12-15 NOTE — Progress Notes (Signed)
Patient ID: Sharon Gilmore, female   DOB: March 15, 1934, 84 y.o.   MRN: [id number]  Patient with perforated viscus, acute abdomen  Discussed with patient and family. Plan exploratory laparotomy.  Risks discussed.  These include but are not limited to bleeding, infection, injury to surrounding structures, the need for bowel resection, ostomy, cardiopulmonary issues, DVT, prolonged intubation and death.  They agree to proceed.

## 2017-12-15 NOTE — Progress Notes (Signed)
Peggye PittK Gallman, RN Notified OR of blood in the patients urine

## 2017-12-15 NOTE — Transfer of Care (Signed)
Immediate Anesthesia Transfer of Care Note  Patient: Sharon EvaJudith A Gilmore  Procedure(s) Performed: EXPLORATORY LAPAROTOMY (N/A Abdomen) COLON RESECTION SIGMOID (N/A Abdomen) COLOSTOMY (N/A Abdomen)  Patient Location: ICU  Anesthesia Type:General  Level of Consciousness: Patient remains intubated per anesthesia plan  Airway & Oxygen Therapy: Patient remains intubated per anesthesia plan and Patient placed on Ventilator (see vital sign flow sheet for setting)  Post-op Assessment: Report given to RN and Post -op Vital signs reviewed and stable  Post vital signs: Reviewed and stable  Last Vitals:  Vitals Value Taken Time  BP 112/53 11/28/2017 10:03 AM  Temp    Pulse 79 11/29/2017 10:07 AM  Resp 18 11/30/2017 10:07 AM  SpO2 100 % 11/16/2017 10:07 AM  Vitals shown include unvalidated device data.  Last Pain:  Vitals:   10/08/2017 0450  PainSc: 10-Worst pain ever         Complications: No apparent anesthesia complications

## 2017-12-15 NOTE — Anesthesia Preprocedure Evaluation (Signed)
Anesthesia Evaluation  Patient identified by MRN, date of birth, ID band Patient awake    Reviewed: Allergy & Precautions, NPO status , Patient's Chart, lab work & pertinent test results  Airway Mallampati: II  TM Distance: >3 FB Neck ROM: Full    Dental  (+) Teeth Intact, Dental Advisory Given, Caps   Pulmonary asthma , former smoker,    Pulmonary exam normal breath sounds clear to auscultation       Cardiovascular hypertension, Pt. on medications + dysrhythmias + pacemaker + Valvular Problems/Murmurs  Rhythm:Regular Rate:Normal     Neuro/Psych Seizures -,  PSYCHIATRIC DISORDERS Depression  Neuromuscular disease    GI/Hepatic negative GI ROS, Neg liver ROS,   Endo/Other  negative endocrine ROS  Renal/GU Renal disease     Musculoskeletal  (+) Arthritis ,   Abdominal   Peds  Hematology negative hematology ROS (+)   Anesthesia Other Findings Day of surgery medications reviewed with the patient.  Reproductive/Obstetrics                             Anesthesia Physical Anesthesia Plan  ASA: III and emergent  Anesthesia Plan: General   Post-op Pain Management:    Induction: Intravenous  PONV Risk Score and Plan: Treatment may vary due to age or medical condition  Airway Management Planned: Oral ETT  Additional Equipment: CVP and Arterial line  Intra-op Plan:   Post-operative Plan: Possible Post-op intubation/ventilation  Informed Consent:   Dental advisory given  Plan Discussed with: CRNA, Anesthesiologist and Surgeon  Anesthesia Plan Comments:         Anesthesia Quick Evaluation

## 2017-12-15 NOTE — Progress Notes (Addendum)
Pharmacy Antibiotic Note  Sharon EvaJudith A Gilmore is a 84 y.o. female admitted on 11/25/2017 with peritonitis due to bowel perforation. Starting broad spectrum antibiotics; patient received levaquin in the ED x1.  Plan: -Vancomycin 1250 mg IV x1 then 1 g IV q24h -Flagyl 500 mg IV q8h -Fluconazole 400 mg IV q24h -Aztreonam 1 g IV q8h -Monitor renal fx, cultures, VT as needed    Height: 5\' 4"  (162.6 cm) Weight: 145 lb 15.1 oz (66.2 kg) IBW/kg (Calculated) : 54.7   Recent Labs  Lab 08/01/2018 0456  CREATININE 1.10*  LATICACIDVEN 8.84*    Estimated Creatinine Clearance: 37.5 mL/min (A) (by C-G formula based on SCr of 1.1 mg/dL (H)).    Allergies  Allergen Reactions  . Celebrex [Celecoxib] Other (See Comments)    LEG ACHING   . Codeine Nausea And Vomiting  . Cymbalta [Duloxetine Hcl] Other (See Comments)    Sensitive per MAR  . Doxycycline Swelling and Other (See Comments)    Facial swelling  . Meloxicam Other (See Comments)    TUMMY ACHE, INEFFECTIVE  . Nortriptyline Other (See Comments)    unknown  . Percocet [Oxycodone-Acetaminophen] Nausea And Vomiting  . Penicillins Rash and Other (See Comments)    Has patient had a PCN reaction causing immediate rash, facial/tongue/throat swelling, SOB or lightheadedness with hypotension: No Has patient had a PCN reaction causing severe rash involving mucus membranes or skin necrosis: No Has patient had a PCN reaction that required hospitalization No Has patient had a PCN reaction occurring within the last 10 years: No If all of the above answers are "NO", then may proceed with Cephalosporin use.    Antimicrobials this admission: 4/5 fluconazole > 4/5 flagyl > 4/5 vancomycin > 4/5 aztreonam >  Dose adjustments this admission: N/A  Microbiology results: 4/5 blood cx: 4/5 urine cx:    Sharon Gilmore, Sharon Gilmore 11/30/2017 6:33 AM

## 2017-12-15 NOTE — H&P (Signed)
Sharon Gilmore is an 84 y.o. female.   Chief Complaint: Abdominal pain/ distention HPI:Pt presents with a CC of abdominal pain and distention that started earlier in the evening.  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  She has chronic constipation issues.  She developed sudden onset of severe lower abdominal pain this am.  It is sharp and diffuse.  No radiation.  Location is abdomen.  Movement makes it  Worse.  On chronic steroids.  Palin films show fre air consistent with perforated viscous.    Past Medical History:  Diagnosis Date  . Abnormal barium swallow 09/1999  . Arthritis   . Asthma   . Carpal tunnel syndrome   . CMV colitis (HCC)   . Depression   . Erythema nodosum   . H/O Doppler ultrasound    LEFT LEG 11/1997  . Hypertension   . Psoriasis   . Rosacea   . Seizures (HCC) 1980  . Swallowing disorder     Past Surgical History:  Procedure Laterality Date  . BILATERAL CARPAL TUNNEL RELEASE    . BREAST EXCISIONAL BIOPSY Right   . CHOLECYSTECTOMY    . COLONOSCOPY  2004  . FLEXIBLE SIGMOIDOSCOPY N/A 05/17/2017   Procedure: FLEXIBLE SIGMOIDOSCOPY;  Surgeon: Carman Ching, MD;  Location[facility] ENDOSCOPY;  Service: Endoscopy;  Laterality: N/A;  . FLEXIBLE SIGMOIDOSCOPY N/A 08/20/2017   Procedure: FLEXIBLE SIGMOIDOSCOPY;  Surgeon: Carman Ching, MD;  Location: [facility] ENDOSCOPY;  Service: Endoscopy;  Laterality: N/A;  . INCISIONAL HERNIA REPAIR    . LEFT HEART CATH AND CORONARY ANGIOGRAPHY N/A 03/15/2017   Procedure: Left Heart Cath and Coronary Angiography;  Surgeon: Runell Gess, MD;  Location: [facility] INVASIVE CV LAB;  Service: Cardiovascular;  Laterality: N/A;  . PACEMAKER IMPLANT N/A 03/09/2017   Medtronic Azure XT MRI conditional dual-chamber pacemaker for symptomatic mobitz II second degree AV block by Dr Johney Frame  . TONSILLECTOMY      Family History  Problem Relation Age of Onset  . CVA Mother    . Stroke Mother   . Parkinson's disease Father   . Leukemia Brother   . CVA Maternal Grandmother   . Stroke Maternal Grandmother   . Peripheral vascular disease Maternal Grandfather   . Breast cancer Paternal Grandmother 87  . Diabetes Paternal Grandmother   . CVA Paternal Grandfather   . Stroke Paternal Grandfather   . Pancreatic cancer Paternal Aunt   . Stroke Paternal Aunt   . Lung cancer Paternal Uncle   . Pancreatic cancer Brother   . Coronary artery disease Brother    Social History:  reports that she quit smoking about 37 years ago. She has never used smokeless tobacco. She reports that she drinks alcohol. She reports that she does not use drugs.  Allergies:  Allergies  Allergen Reactions  . Celebrex [Celecoxib] Other (See Comments)    LEG ACHING   . Codeine Nausea And Vomiting  . Cymbalta [Duloxetine Hcl] Other (See Comments)    Sensitive per MAR  . Doxycycline Swelling and Other (See Comments)    Facial swelling  . Meloxicam Other (See Comments)    TUMMY ACHE, INEFFECTIVE  . Nortriptyline Other (See Comments)    unknown  . Percocet [Oxycodone-Acetaminophen] Nausea And Vomiting  . Penicillins Rash and Other (See Comments)    Has patient had a PCN reaction causing immediate rash, facial/tongue/throat swelling, SOB or lightheadedness with hypotension: No Has patient  had a PCN reaction causing severe rash involving mucus membranes or skin necrosis: No Has patient had a PCN reaction that required hospitalization No Has patient had a PCN reaction occurring within the last 10 years: No If all of the above answers are "NO", then may proceed with Cephalosporin use.     (Not in a hospital admission)  Results for orders placed or performed during the hospital encounter of 12-18-17 (from the past 48 hour(s))  I-Stat Troponin, ED (not [facility])     Status: None   Collection Time: 12-18-17  4:54 AM  Result Value Ref Range   Troponin i, poc 0.04 0.00 - 0.08 ng/mL    Comment 3            Comment: Due to the release kinetics of cTnI, a negative result within the first hours of the onset of symptoms does not rule out myocardial infarction with certainty. If myocardial infarction is still suspected, repeat the test at appropriate intervals.   I-Stat Chem 8, ED     Status: Abnormal   Collection Time: 12-18-17  4:56 AM  Result Value Ref Range   Sodium 139 135 - 145 mmol/L   Potassium 3.6 3.5 - 5.1 mmol/L   Chloride 104 101 - 111 mmol/L   BUN 23 (H) 6 - 20 mg/dL   Creatinine, Ser 9.601.10 (H) 0.44 - 1.00 mg/dL   Glucose, Bld 454133 (H) 65 - 99 mg/dL   Calcium, Ion 0.981.07 (L) 1.15 - 1.40 mmol/L   TCO2 20 (L) 22 - 32 mmol/L   Hemoglobin 13.6 12.0 - 15.0 g/dL   HCT 11.940.0 14.736.0 - 82.946.0 %  I-Stat CG4 Lactic Acid, ED     Status: Abnormal   Collection Time: 12-18-17  4:56 AM  Result Value Ref Range   Lactic Acid, Venous 8.84 (HH) 0.5 - 1.9 mmol/L   Comment NOTIFIED PHYSICIAN    Dg Chest Port 1 View  Result Date: 12/10/2017 CLINICAL DATA:  Abdominal distention for 4 hours. EXAM: PORTABLE CHEST 1 VIEW COMPARISON:  05/15/2017 FINDINGS: There is a large amount of free air under the hemidiaphragms. In the absence of any recent surgery, this is likely to indicate bowel perforation. Shallow inspiration with atelectasis in the lung bases. Heart size and pulmonary vascularity are normal for technique. No pneumothorax. No blunting of costophrenic angles. Cardiac pacemaker. IMPRESSION: Large amount of free air under the hemidiaphragms likely to represent bowel perforation unless there has been recent surgery. Shallow inspiration with atelectasis in the lung bases. These results were called by telephone at the time of interpretation on 11/15/2017 at 5:29 am to Dr. Rochele RaringKRISTEN WARD , who verbally acknowledged these results. Electronically Signed   By: Burman NievesWilliam  Stevens M.D.   On: 12-20-2017 05:31   Dg Abd Portable 1v  Result Date: 12/12/2017 CLINICAL DATA:  Abdominal distention for 4 hours.  EXAM: PORTABLE ABDOMEN - 1 VIEW COMPARISON:  CT abdomen and pelvis 08/18/2017 FINDINGS: Limited field of view study excludes the hemidiaphragms and the lower pelvis. Mildly distended stool-filled transverse colon. No small bowel distention. Amorphous gas collection over the epigastric region corresponding to free air, better seen on the chest radiograph obtained at the same time. Degenerative and postoperative changes in the spine. IMPRESSION: 1. Free intraperitoneal air, better seen on chest x-ray, suspicious for bowel perforation. 2. Mildly distended stool-filled colon.  No small bowel distention. Electronically Signed   By: Burman NievesWilliam  Stevens M.D.   On: 12-20-2017 05:33    Review of Systems  Unable to perform ROS: Acuity of condition    Blood pressure (!) 79/53, pulse (!) 102, resp. rate (!) 41, height 5\' 4"  (1.626 m), weight 66.2 kg (145 lb 15.1 oz), SpO2 98 %. Physical Exam  Constitutional: She appears lethargic. She is cooperative. She is easily aroused. She has a sickly appearance. She appears ill.  Eyes: Pupils are equal, round, and reactive to light. Conjunctivae are normal.  Neck: Normal range of motion. Neck supple.  Cardiovascular:  tachycardia  Pacer in place   Respiratory: She has wheezes.  GI: She exhibits distension. There is tenderness. There is rebound.  Neurological: She is easily aroused. She appears lethargic. GCS eye subscore is 4. GCS verbal subscore is 5. GCS motor subscore is 6.  Able to answer questions appropriately  Understands her situation     Assessment/Plan Hx colon stricture Peritonitis with free air CMV colitis  Very poor overall health and on chronic steroids Unlikely to survive surgery and has a 80 % complication rate with surgery and high mortality rate May need permanent colostomy  May need prolonged intubation  High risk of dehiscence Discussed all of this with the patient but she wished to proceed with exploratory laparotomy Needs  CCM to see for  assistance with volume repletion and management of septic shock  Patient Active Problem List   Diagnosis Date Noted  . Bowel perforation (HCC) 12-14-17  . Leukopenia   . Hypokalemia   . Colonic stricture (HCC) 08/18/2017  . Weight loss, non-intentional 07/05/2017  . URI (upper respiratory infection) 06/15/2017  . Medication monitoring encounter 06/11/2017  . Cardiac pacemaker in situ 05/24/2017  . Rectal abnormality 05/16/2017  . SBO (small bowel obstruction) (HCC)   . PAF (paroxysmal atrial fibrillation) (HCC)   . Second degree AV block, Mobitz type II   . History of CMV colitis (HCC)   . AKI (acute kidney injury) (HCC)   . Urinary tract infection without hematuria   . Lumbar spinal stenosis 04/23/2017  . DOE (dyspnea on exertion) 03/08/2017  . AV block, 2nd degree 03/08/2017  . Essential hypertension 03/08/2017  . Family history of coronary artery disease in brother 03/08/2017  . History of asthma 03/08/2017  . AVB (atrioventricular block) 03/08/2017  . Abdominal pain 01/04/2017  . Spinal stenosis 01/04/2017         Dortha Schwalbe, MD December 14, 2017, 6:01 AM

## 2017-12-15 NOTE — Progress Notes (Signed)
Initial Nutrition Assessment  INTERVENTION:   As able recommend trickle TF Pt may need TPN if becomes stable and requires prolonged NPO status   NUTRITION DIAGNOSIS:   Increased nutrient needs related to (surgery) as evidenced by estimated needs.  GOAL:   Patient will meet greater than or equal to 90% of their needs  MONITOR:   I & O's, Vent status  REASON FOR ASSESSMENT:   Ventilator, Malnutrition Screening Tool    ASSESSMENT:   Pt with PMH of HTN, CMV colitis with stricture, GI issues since Sept 2018 admitted early this am 4/5 for abd pain and distention. OR today for sigmoid perf with extremely large amount of fecal contamination throughout her abd cavity s/p ex lap and colostomy.    Per MD notes pt may need CRRT.  Pt discussed during ICU rounds and with RN.  Pt with very low temp Per chart review pt with prior weight of 180 lb last in 9/18 when GI issues supposedly started but is down to 145 lb on admission (35 lb weight loss possibly) unable to confirm weight loss.  Unable to determine if pt meets criteria for malnutrition at this time but it is suspected.   Patient is currently intubated on ventilator support MV: 9.8 L/min Temp (24hrs), Avg:95.9 F (35.5 C), Min:95.5 F (35.3 C), Max:96.2 F (35.7 C)  Medications reviewed and include: levo 40 mcg/min, neo 75 mcg/min Labs reviewed: K+ 6.6 (H), PO4 7.9 (H) 3 drains: 160 ml   A-line MAP: 56  NUTRITION - FOCUSED PHYSICAL EXAM:   Unable to complete Nutrition-Focused physical exam at this time.    Diet Order:  Diet NPO time specified Diet NPO time specified  EDUCATION NEEDS:   No education needs have been identified at this time  Skin:  Skin Assessment: (open abd)  Last BM:  unknown  Height:   Ht Readings from Last 1 Encounters:  04/06/2018 5\' 4"  (1.626 m)    Weight:   Wt Readings from Last 1 Encounters:  04/06/2018 162 lb 0.6 oz (73.5 kg)    Ideal Body Weight:  54.5 kg  BMI:  Body mass index  is 27.81 kg/m.  Estimated Nutritional Needs:   Kcal:  1400  Protein:  100-130 grams  Fluid:  > 1.5 L/day  Kendell BaneHeather Rochell Mabie RD, LDN, CNSC [phone] Pager [phone] After Hours Pager

## 2017-12-15 DEATH — deceased

## 2018-09-03 IMAGING — NM NM MISC PROCEDURE
3 series · 18 of 18 positions shown · non-contrast
Comparison: none

[Series 1: rest_(id)_sa · 6.4mm · 6.40mm/px · 6 of 64 frames shown]
[frame 6/64]
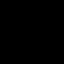
[frame 16/64]
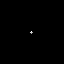
[frame 27/64]
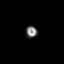
[frame 38/64]
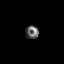
[frame 48/64]
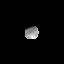
[frame 59/64]
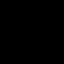

[Series 1: stress-gsp_(id)_sa · 6.4mm · 6.40mm/px · 6 of 512 frames shown]
[frame 43/512]
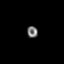
[frame 128/512]
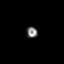
[frame 214/512]
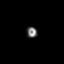
[frame 299/512]
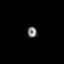
[frame 384/512]
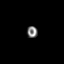
[frame 470/512]
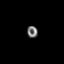

[Series 1: stress-sum-em_(id)_sa · 6.4mm · 6.40mm/px · 6 of 64 frames shown]
[frame 6/64]
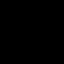
[frame 16/64]
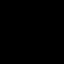
[frame 27/64]
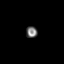
[frame 38/64]
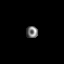
[frame 48/64]
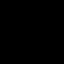
[frame 59/64]
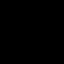

[18 of 18 positions shown; findings below may reference images not displayed]

Canned report from images found in remote index.

Refer to host system for actual result text.

## 2018-09-29 IMAGING — CR DG HIP (WITH OR WITHOUT PELVIS) 2-3V*R*
3 series · 3 of 3 positions shown · non-contrast
Comparison: None.

CLINICAL DATA: Right thigh pain for 1 week with no known injury.

EXAM:
DG HIP (WITH OR WITHOUT PELVIS) 2-3V RIGHT

[t pelvis ap]
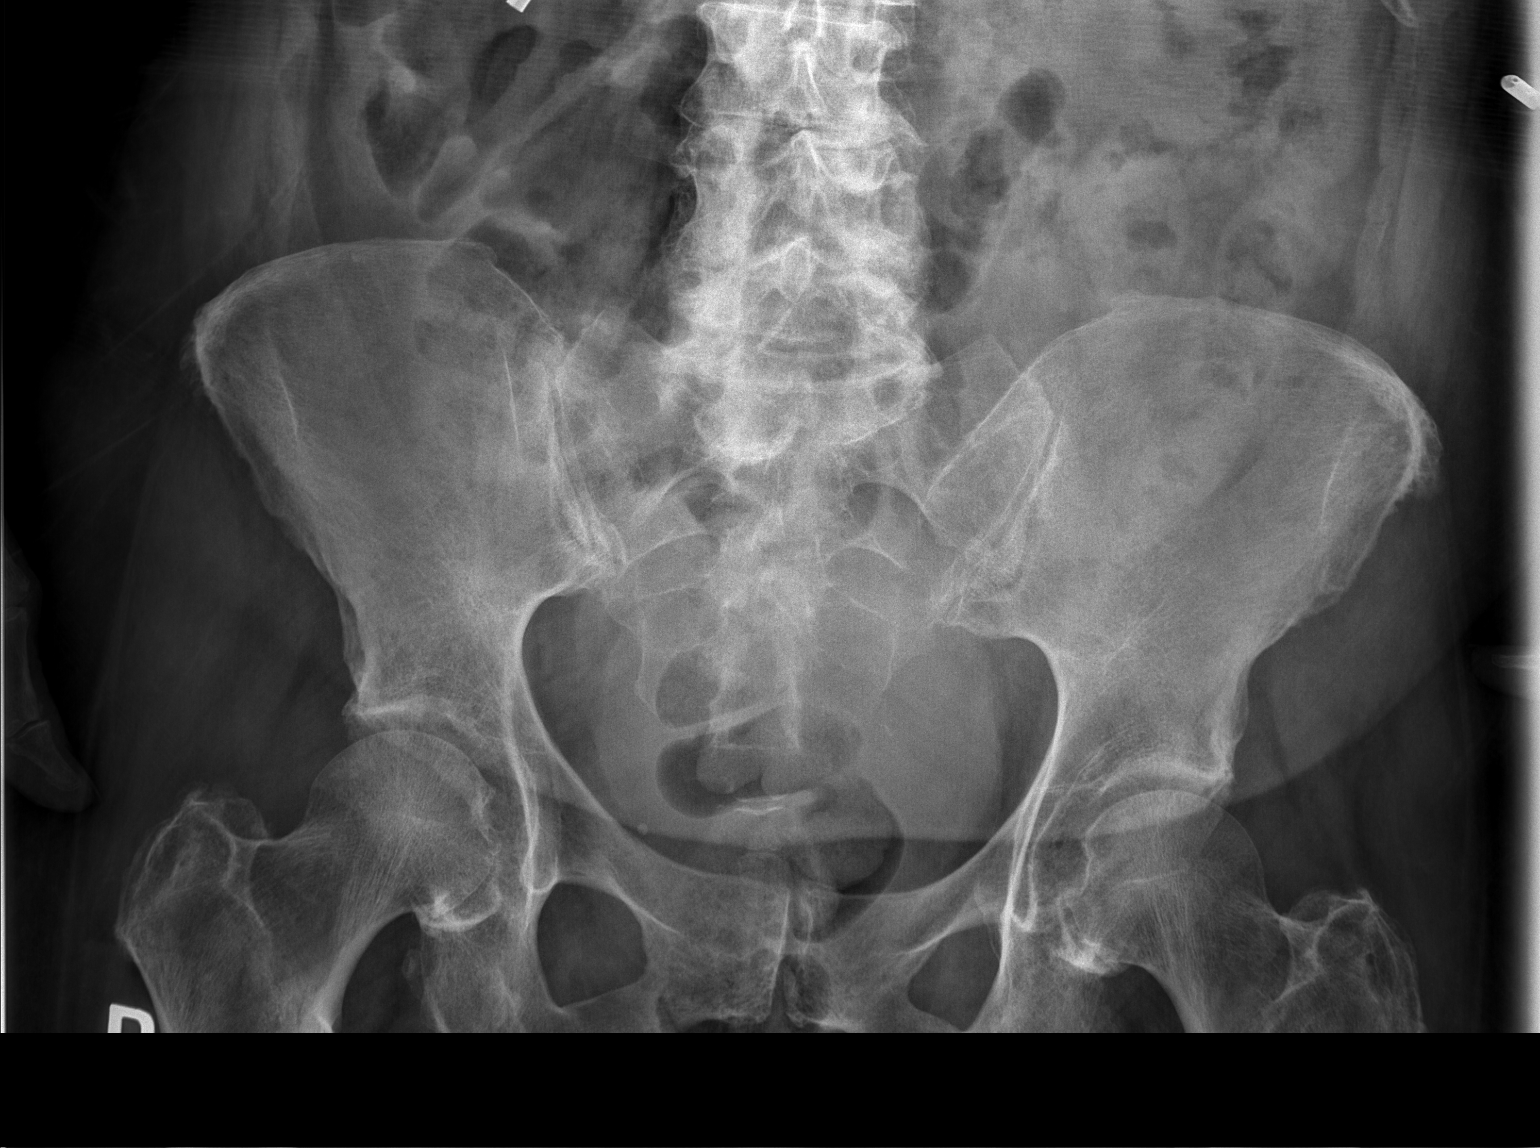

[t hip ap right]
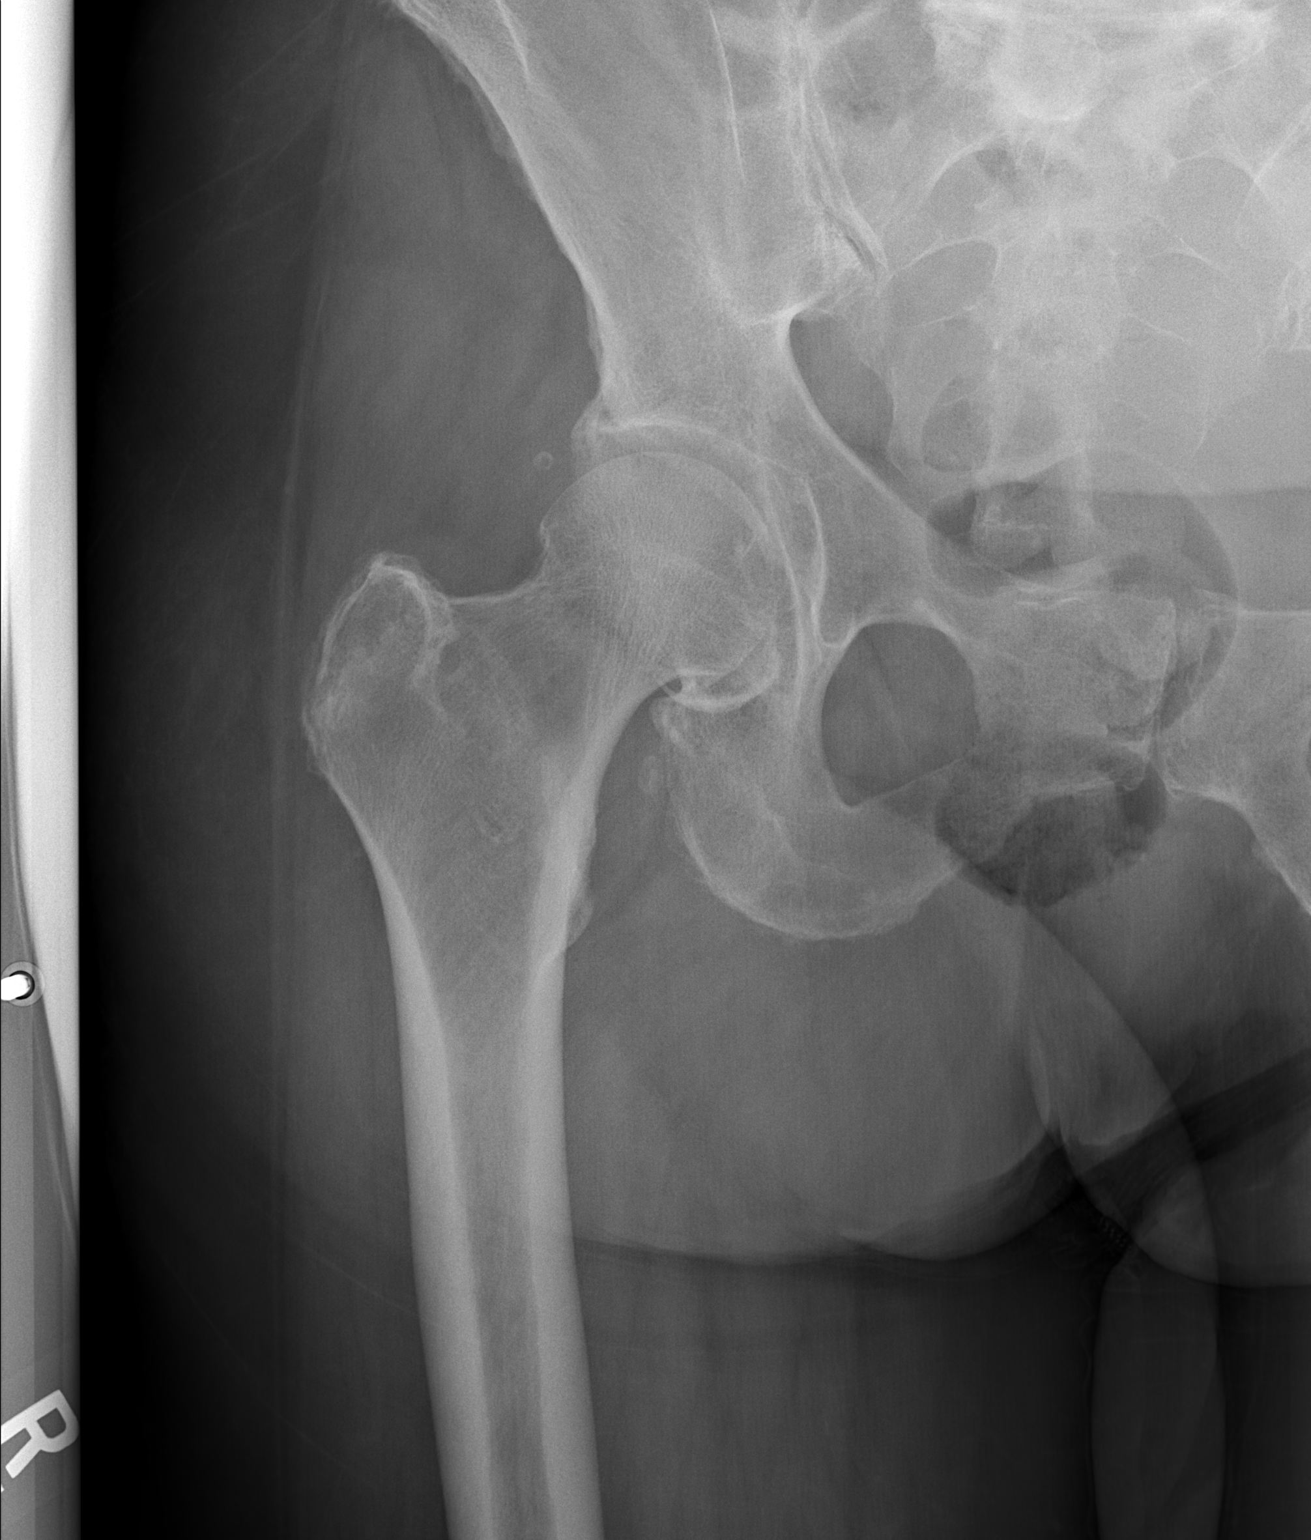

[t hip frog leg right]
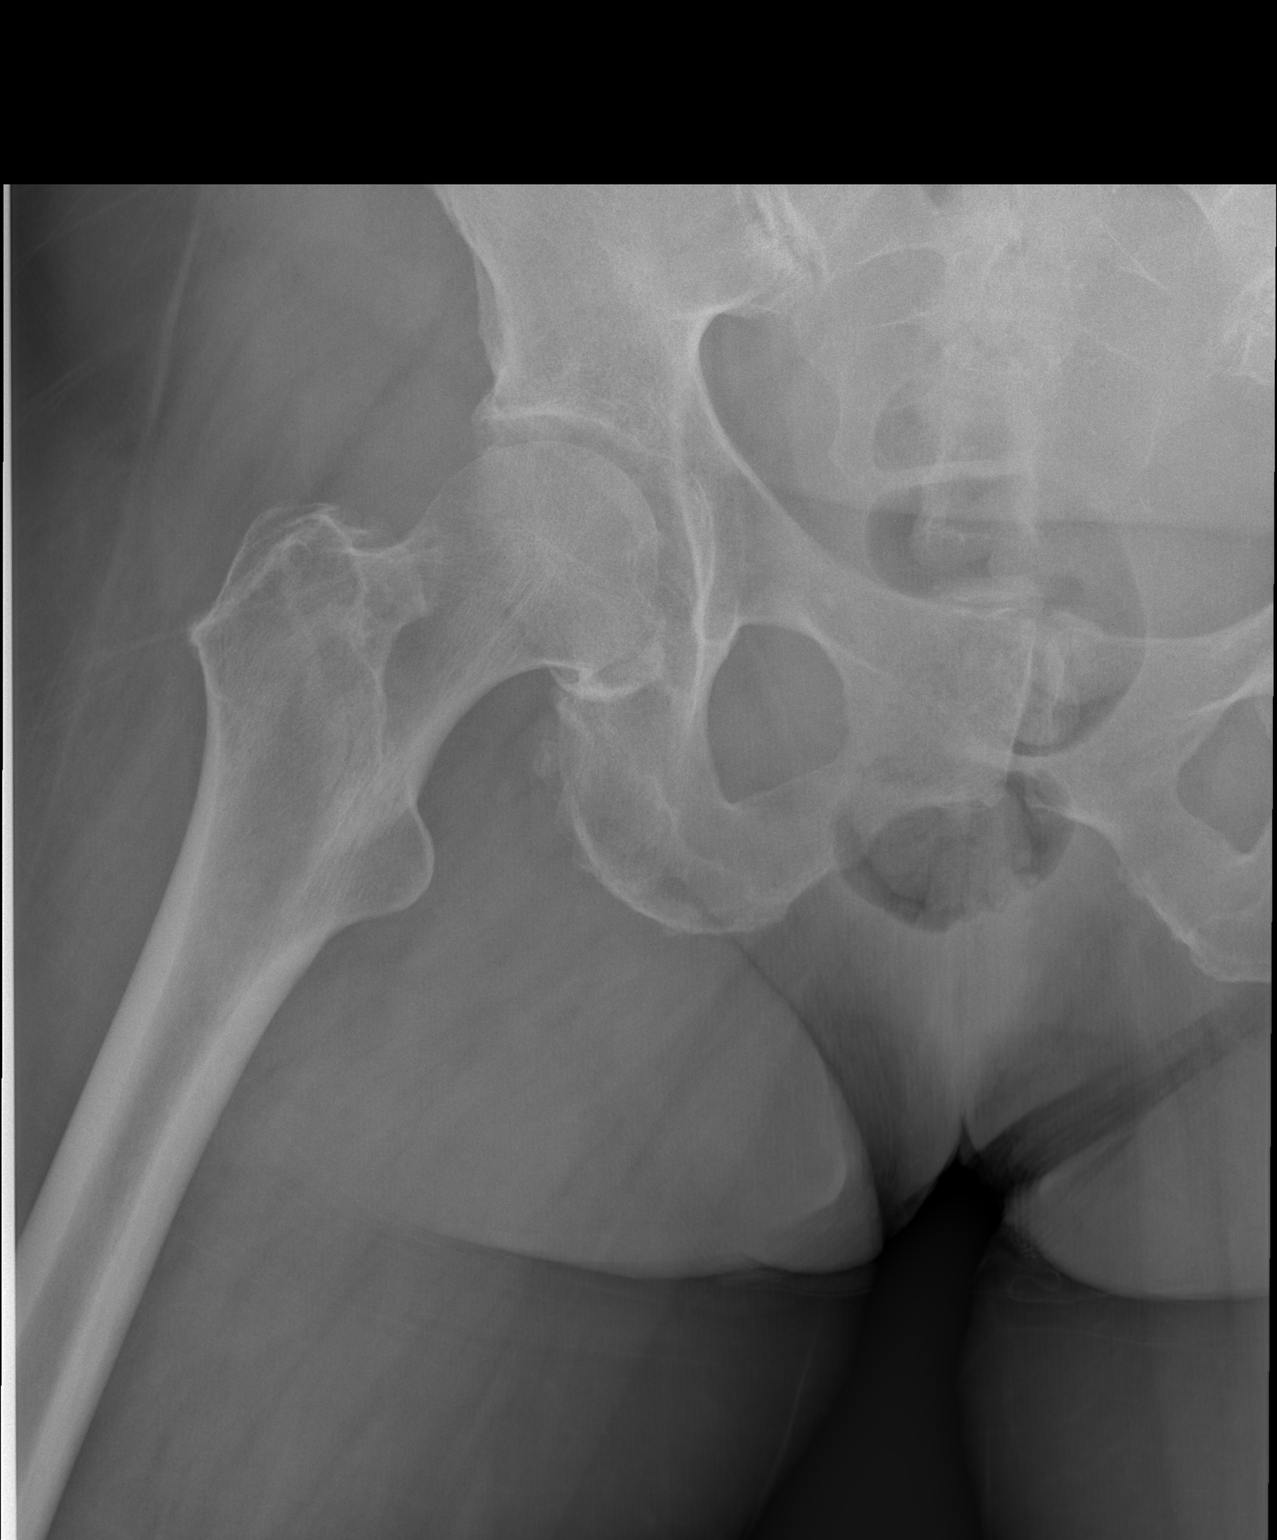

[3 of 3 positions shown; findings below may reference images not displayed]

FINDINGS: No evidence of hip fracture or dislocation. No pelvic ring diastasis
or fracture. Osteopenia. Lumbar degenerative disc and facet disease.
IMPRESSION: 1. No acute finding.
2. Lumbar spine degeneration.
3. Osteopenia.

## 2019-04-10 IMAGING — CT CT ABD-PELV W/ CM
1 of 3 series · 14 of 32 positions shown, 19 images · IV contrast (APPLIED)
Comparison: None.

CLINICAL DATA: Left upper quadrant abdominal pain for 2 months.

EXAM:
CT ABDOMEN AND PELVIS WITH CONTRAST
TECHNIQUE: Multidetector CT imaging of the abdomen and pelvis was performed
using the standard protocol following bolus administration of
intravenous contrast.
CONTRAST:  100mL H6UMAX-SZZ IOPAMIDOL (H6UMAX-SZZ) INJECTION 61%

[Series 2: abd/pelvis w/cm · axial · 0.84mm/px · z∈[-479,-64]mm · 14 of 93 slices shown, 19 images]
[im 5/93  soft-tissue]
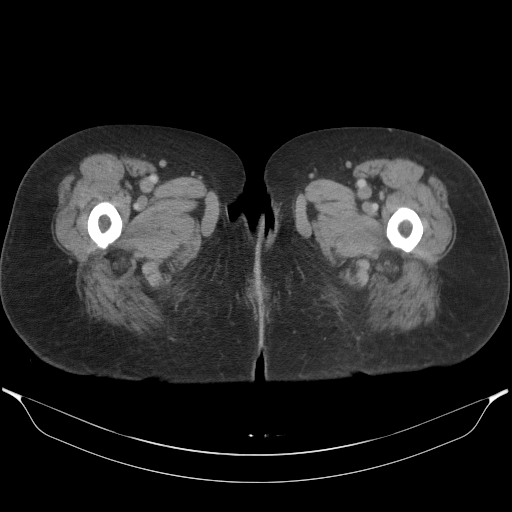
[im 5/93  bone]
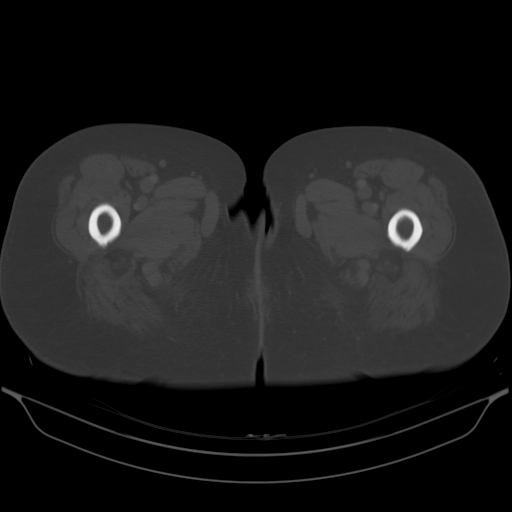
[im 15/93  soft-tissue]
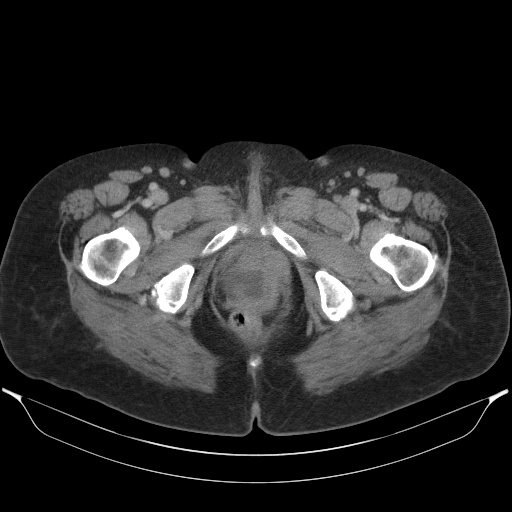
[im 20/93  soft-tissue]
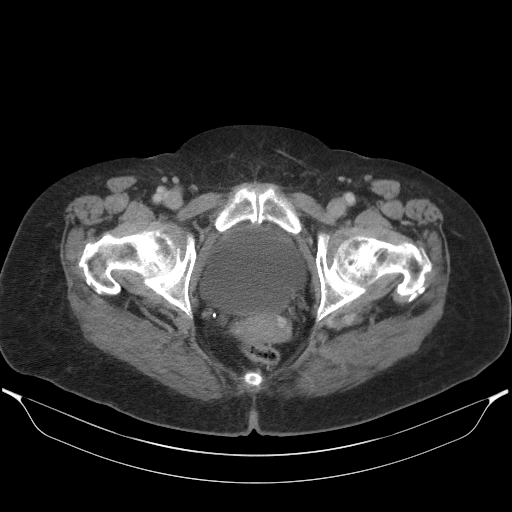
[im 25/93  soft-tissue]
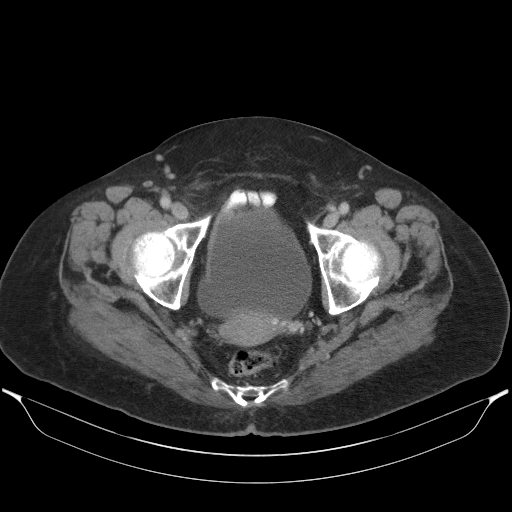
[im 34/93  soft-tissue]
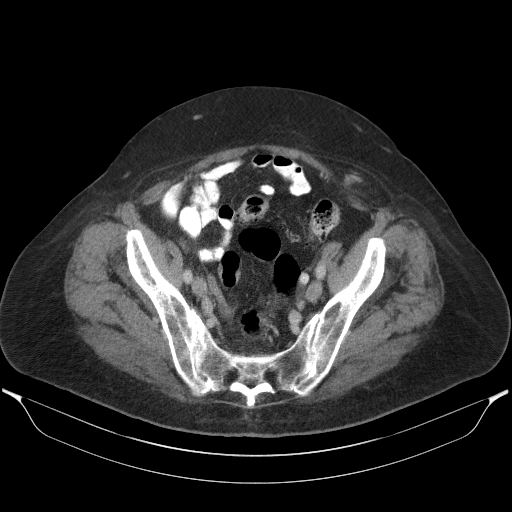
[im 39/93  soft-tissue]
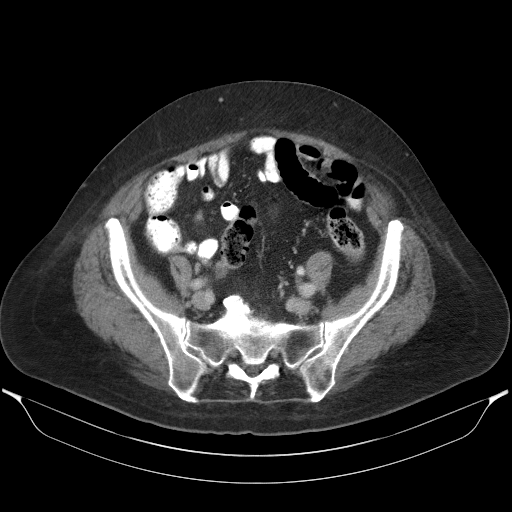
[im 49/93  soft-tissue]
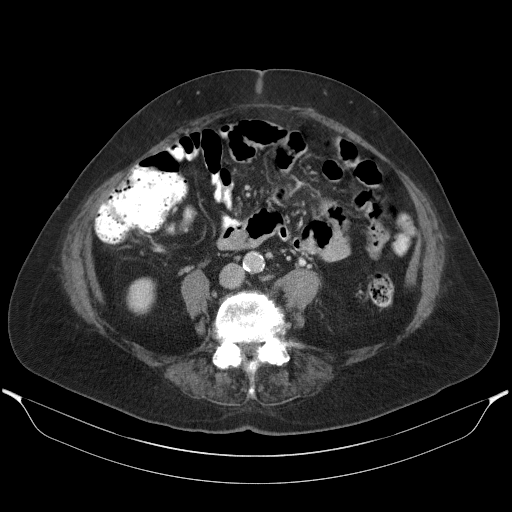
[im 54/93  soft-tissue]
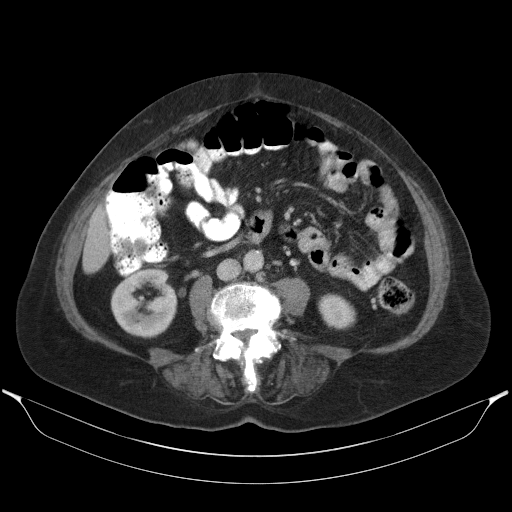
[im 59/93  soft-tissue]
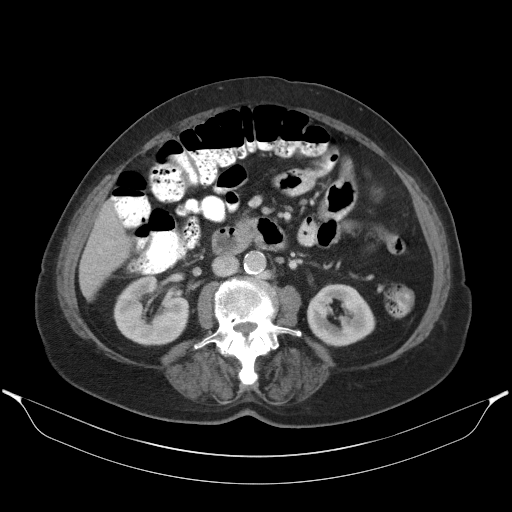
[im 59/93  bone]
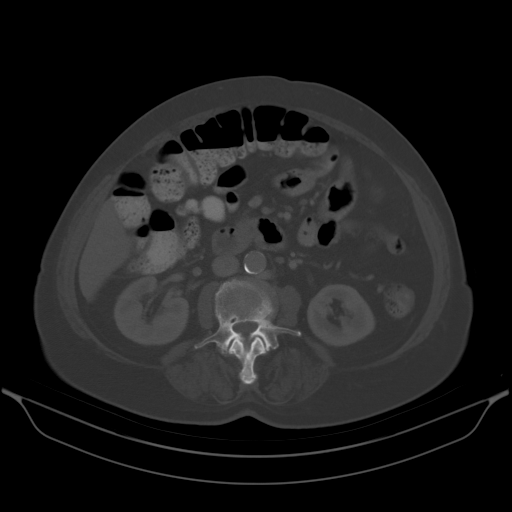
[im 68/93  soft-tissue]
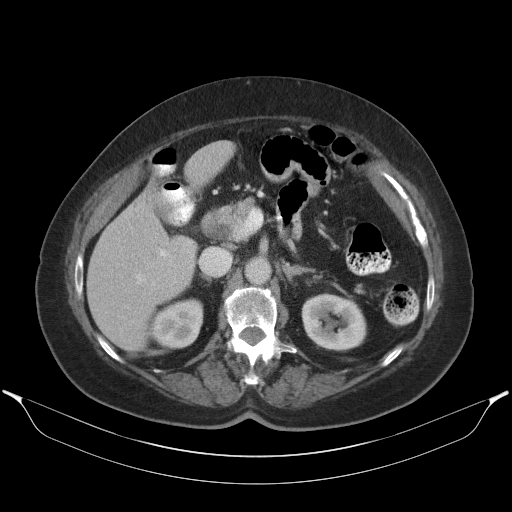
[im 73/93  soft-tissue]
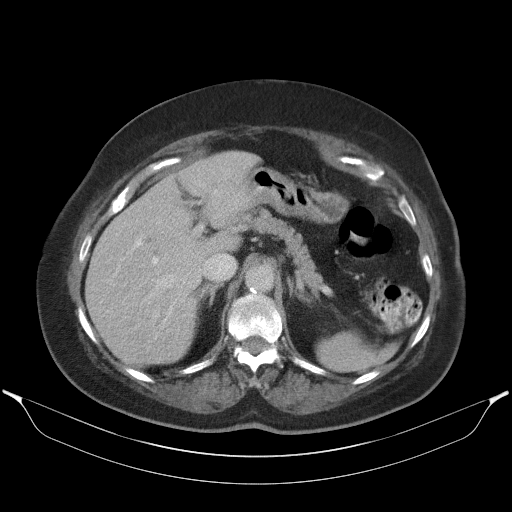
[im 73/93  lung]
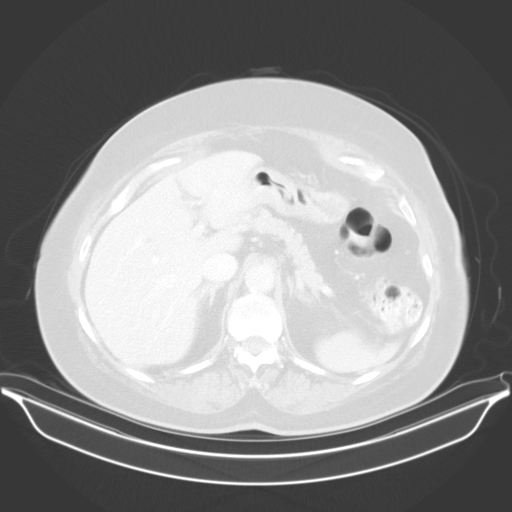
[im 78/93  soft-tissue]
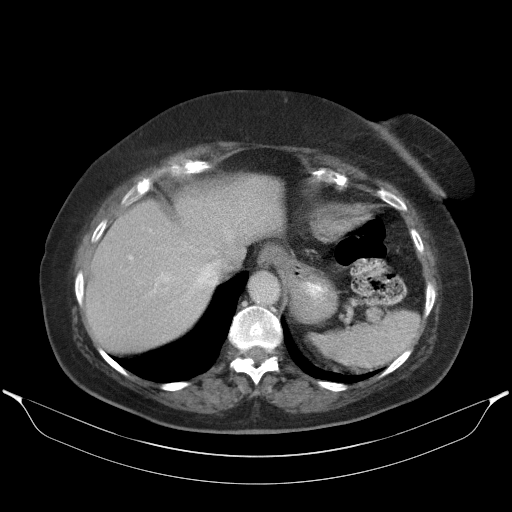
[im 78/93  lung]
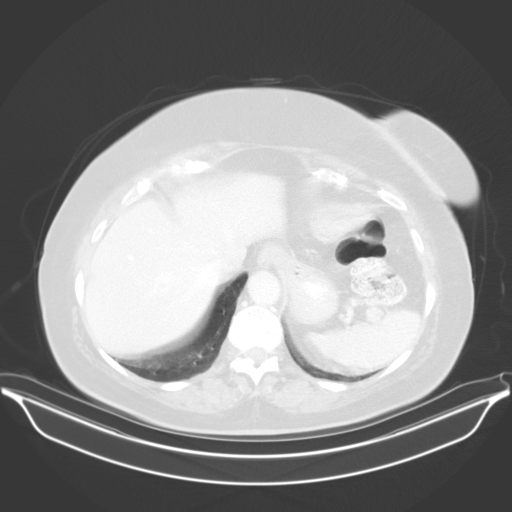
[im 83/93  lung]
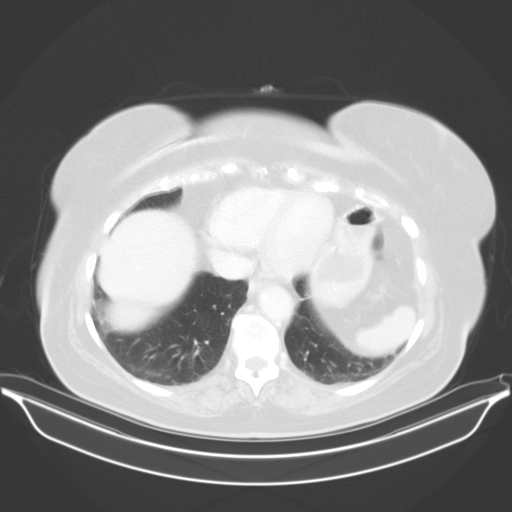
[im 88/93  soft-tissue]
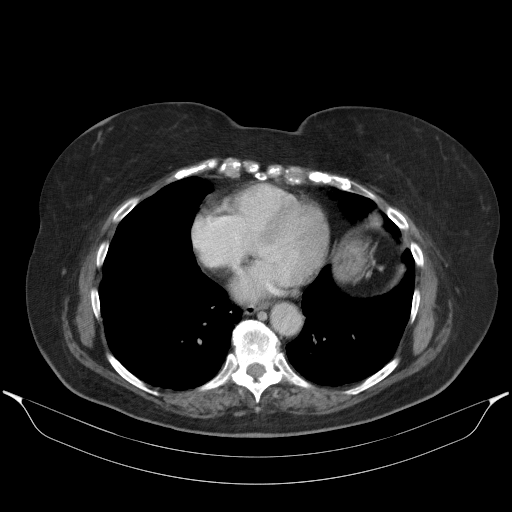
[im 88/93  lung]
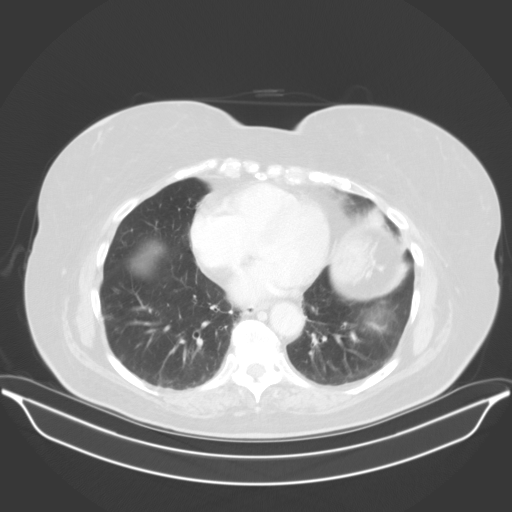

[14 of 32 positions shown; findings below may reference images not displayed]

FINDINGS: Lower chest: No acute abnormality.

Hepatobiliary: No focal liver abnormality is seen. Status post
cholecystectomy. No biliary dilatation.

Pancreas: Unremarkable. No pancreatic ductal dilatation or
surrounding inflammatory changes.

Spleen: Normal in size without focal abnormality.

Adrenals/Urinary Tract: Adrenal glands are unremarkable. Kidneys are
normal, without renal calculi, focal lesion, or hydronephrosis.
Bladder is unremarkable.

Stomach/Bowel: Stomach is within normal limits. Appendix appears
normal. No evidence of bowel wall thickening, distention, or
inflammatory changes.

Vascular/Lymphatic: Aortic atherosclerosis. No enlarged abdominal or
pelvic lymph nodes.

Reproductive: Uterus and bilateral adnexa are unremarkable.

Other: No abdominal wall hernia or abnormality. No abdominopelvic
ascites.

Musculoskeletal: Severe multilevel degenerative disc disease is
noted in the lower lumbar spine. No acute abnormality is noted
IMPRESSION: Aortic atherosclerosis.

No acute abnormality seen in the abdomen or pelvis.
